# Patient Record
Sex: Male | Born: 1935
Health system: Southern US, Community
[De-identification: ages and names within clinical notes are randomized; demographics above are authoritative.]

## PROBLEM LIST (undated history)

## (undated) DIAGNOSIS — Z77090 Contact with and (suspected) exposure to asbestos: Secondary | ICD-10-CM

## (undated) DIAGNOSIS — I10 Essential (primary) hypertension: Secondary | ICD-10-CM

## (undated) DIAGNOSIS — M545 Low back pain, unspecified: Secondary | ICD-10-CM

## (undated) DIAGNOSIS — D510 Vitamin B12 deficiency anemia due to intrinsic factor deficiency: Secondary | ICD-10-CM

## (undated) DIAGNOSIS — G600 Hereditary motor and sensory neuropathy: Secondary | ICD-10-CM

## (undated) DIAGNOSIS — I2510 Atherosclerotic heart disease of native coronary artery without angina pectoris: Secondary | ICD-10-CM

## (undated) DIAGNOSIS — G4730 Sleep apnea, unspecified: Secondary | ICD-10-CM

## (undated) DIAGNOSIS — K219 Gastro-esophageal reflux disease without esophagitis: Secondary | ICD-10-CM

## (undated) DIAGNOSIS — F17200 Nicotine dependence, unspecified, uncomplicated: Secondary | ICD-10-CM

## (undated) DIAGNOSIS — E119 Type 2 diabetes mellitus without complications: Secondary | ICD-10-CM

## (undated) DIAGNOSIS — D72819 Decreased white blood cell count, unspecified: Secondary | ICD-10-CM

## (undated) DIAGNOSIS — M1990 Unspecified osteoarthritis, unspecified site: Secondary | ICD-10-CM

## (undated) DIAGNOSIS — J189 Pneumonia, unspecified organism: Secondary | ICD-10-CM

## (undated) DIAGNOSIS — J449 Chronic obstructive pulmonary disease, unspecified: Secondary | ICD-10-CM

## (undated) DIAGNOSIS — I219 Acute myocardial infarction, unspecified: Secondary | ICD-10-CM

## (undated) DIAGNOSIS — D509 Iron deficiency anemia, unspecified: Secondary | ICD-10-CM

## (undated) HISTORY — DX: Low back pain, unspecified: M54.50

## (undated) HISTORY — DX: Vitamin B12 deficiency anemia due to intrinsic factor deficiency: D51.0

## (undated) HISTORY — DX: Atherosclerotic heart disease of native coronary artery without angina pectoris: I25.10

## (undated) HISTORY — PX: CYST EXCISION: SHX5701

## (undated) HISTORY — DX: Nicotine dependence, unspecified, uncomplicated: F17.200

## (undated) HISTORY — DX: Contact with and (suspected) exposure to asbestos: Z77.090

## (undated) HISTORY — DX: Chronic obstructive pulmonary disease, unspecified: J44.9

## (undated) HISTORY — DX: Hereditary motor and sensory neuropathy: G60.0

## (undated) HISTORY — DX: Iron deficiency anemia, unspecified: D50.9

## (undated) HISTORY — DX: Decreased white blood cell count, unspecified: D72.819

## (undated) HISTORY — DX: Essential (primary) hypertension: I10

## (undated) HISTORY — DX: Low back pain: M54.5

## (undated) HISTORY — DX: Type 2 diabetes mellitus without complications: E11.9

## (undated) HISTORY — DX: Gastro-esophageal reflux disease without esophagitis: K21.9

## (undated) HISTORY — PX: CATARACT EXTRACTION W/ INTRAOCULAR LENS  IMPLANT, BILATERAL: SHX1307

---

## 1993-01-01 HISTORY — PX: CORONARY ARTERY BYPASS GRAFT: SHX141

## 1997-07-08 ENCOUNTER — Ambulatory Visit (HOSPITAL_COMMUNITY): Admission: RE | Admit: 1997-07-08 | Discharge: 1997-07-08 | Payer: Self-pay | Admitting: Internal Medicine

## 1998-03-24 ENCOUNTER — Inpatient Hospital Stay (HOSPITAL_COMMUNITY): Admission: EM | Admit: 1998-03-24 | Discharge: 1998-03-30 | Payer: Self-pay | Admitting: Emergency Medicine

## 1998-03-24 ENCOUNTER — Encounter: Payer: Self-pay | Admitting: Emergency Medicine

## 1998-03-29 ENCOUNTER — Encounter: Payer: Self-pay | Admitting: Internal Medicine

## 1998-10-12 ENCOUNTER — Ambulatory Visit: Admission: RE | Admit: 1998-10-12 | Discharge: 1998-10-12 | Payer: Self-pay | Admitting: Pulmonary Disease

## 1998-10-30 ENCOUNTER — Encounter: Payer: Self-pay | Admitting: Emergency Medicine

## 1998-10-30 ENCOUNTER — Inpatient Hospital Stay (HOSPITAL_COMMUNITY): Admission: EM | Admit: 1998-10-30 | Discharge: 1998-11-03 | Payer: Self-pay | Admitting: Emergency Medicine

## 1998-10-31 ENCOUNTER — Encounter: Payer: Self-pay | Admitting: Internal Medicine

## 1999-05-19 ENCOUNTER — Ambulatory Visit: Admission: RE | Admit: 1999-05-19 | Discharge: 1999-05-19 | Payer: Self-pay | Admitting: Pulmonary Disease

## 2000-06-24 ENCOUNTER — Encounter: Payer: Self-pay | Admitting: Cardiology

## 2000-06-24 ENCOUNTER — Ambulatory Visit (HOSPITAL_COMMUNITY): Admission: RE | Admit: 2000-06-24 | Discharge: 2000-06-24 | Payer: Self-pay | Admitting: Cardiology

## 2001-05-08 ENCOUNTER — Encounter: Payer: Self-pay | Admitting: Family Medicine

## 2001-05-08 ENCOUNTER — Ambulatory Visit (HOSPITAL_COMMUNITY): Admission: RE | Admit: 2001-05-08 | Discharge: 2001-05-08 | Payer: Self-pay | Admitting: Endocrinology

## 2002-05-12 ENCOUNTER — Encounter: Payer: Self-pay | Admitting: Pulmonary Disease

## 2002-09-21 LAB — HM COLONOSCOPY

## 2002-09-29 ENCOUNTER — Ambulatory Visit (HOSPITAL_COMMUNITY): Admission: RE | Admit: 2002-09-29 | Discharge: 2002-09-29 | Payer: Self-pay | Admitting: Gastroenterology

## 2002-09-29 ENCOUNTER — Encounter: Payer: Self-pay | Admitting: Gastroenterology

## 2002-12-08 ENCOUNTER — Ambulatory Visit (HOSPITAL_COMMUNITY): Admission: RE | Admit: 2002-12-08 | Discharge: 2002-12-08 | Payer: Self-pay | Admitting: Cardiology

## 2003-11-08 ENCOUNTER — Ambulatory Visit: Payer: Self-pay | Admitting: Endocrinology

## 2003-11-16 ENCOUNTER — Ambulatory Visit: Payer: Self-pay | Admitting: Endocrinology

## 2004-08-04 ENCOUNTER — Ambulatory Visit: Payer: Self-pay | Admitting: Pulmonary Disease

## 2004-08-15 ENCOUNTER — Ambulatory Visit: Payer: Self-pay

## 2004-08-15 ENCOUNTER — Ambulatory Visit: Payer: Self-pay | Admitting: Cardiology

## 2004-08-23 ENCOUNTER — Ambulatory Visit: Payer: Self-pay | Admitting: Cardiology

## 2004-09-01 ENCOUNTER — Ambulatory Visit: Payer: Self-pay | Admitting: Cardiology

## 2004-09-01 ENCOUNTER — Inpatient Hospital Stay (HOSPITAL_BASED_OUTPATIENT_CLINIC_OR_DEPARTMENT_OTHER): Admission: RE | Admit: 2004-09-01 | Discharge: 2004-09-01 | Payer: Self-pay | Admitting: Cardiology

## 2004-09-08 ENCOUNTER — Ambulatory Visit: Payer: Self-pay | Admitting: Pulmonary Disease

## 2004-12-07 ENCOUNTER — Ambulatory Visit: Payer: Self-pay | Admitting: Pulmonary Disease

## 2004-12-20 ENCOUNTER — Ambulatory Visit: Payer: Self-pay | Admitting: Endocrinology

## 2004-12-29 ENCOUNTER — Ambulatory Visit: Payer: Self-pay | Admitting: Endocrinology

## 2005-03-19 ENCOUNTER — Ambulatory Visit: Payer: Self-pay | Admitting: Pulmonary Disease

## 2005-04-17 ENCOUNTER — Observation Stay (HOSPITAL_COMMUNITY): Admission: EM | Admit: 2005-04-17 | Discharge: 2005-04-19 | Payer: Self-pay | Admitting: Emergency Medicine

## 2005-04-17 ENCOUNTER — Ambulatory Visit: Payer: Self-pay | Admitting: Internal Medicine

## 2005-04-20 ENCOUNTER — Ambulatory Visit: Payer: Self-pay | Admitting: Pulmonary Disease

## 2005-04-23 ENCOUNTER — Ambulatory Visit: Payer: Self-pay | Admitting: Endocrinology

## 2005-04-30 ENCOUNTER — Ambulatory Visit: Payer: Self-pay | Admitting: Endocrinology

## 2005-05-30 ENCOUNTER — Ambulatory Visit: Payer: Self-pay | Admitting: Endocrinology

## 2005-06-28 ENCOUNTER — Ambulatory Visit: Payer: Self-pay | Admitting: Endocrinology

## 2005-06-28 ENCOUNTER — Ambulatory Visit: Payer: Self-pay | Admitting: Pulmonary Disease

## 2005-08-07 ENCOUNTER — Ambulatory Visit: Payer: Self-pay | Admitting: Endocrinology

## 2005-12-26 ENCOUNTER — Ambulatory Visit: Payer: Self-pay | Admitting: Endocrinology

## 2006-01-25 ENCOUNTER — Ambulatory Visit: Payer: Self-pay | Admitting: Endocrinology

## 2006-03-01 ENCOUNTER — Ambulatory Visit: Payer: Self-pay | Admitting: Endocrinology

## 2006-04-01 ENCOUNTER — Ambulatory Visit: Payer: Self-pay | Admitting: Endocrinology

## 2006-04-26 ENCOUNTER — Ambulatory Visit: Payer: Self-pay | Admitting: Endocrinology

## 2006-05-30 ENCOUNTER — Ambulatory Visit: Payer: Self-pay | Admitting: Endocrinology

## 2006-06-28 ENCOUNTER — Ambulatory Visit: Payer: Self-pay | Admitting: Endocrinology

## 2006-07-26 ENCOUNTER — Encounter: Payer: Self-pay | Admitting: Endocrinology

## 2006-07-26 DIAGNOSIS — I2510 Atherosclerotic heart disease of native coronary artery without angina pectoris: Secondary | ICD-10-CM

## 2006-07-26 DIAGNOSIS — I10 Essential (primary) hypertension: Secondary | ICD-10-CM

## 2006-07-26 DIAGNOSIS — K219 Gastro-esophageal reflux disease without esophagitis: Secondary | ICD-10-CM

## 2006-07-31 ENCOUNTER — Ambulatory Visit: Payer: Self-pay | Admitting: Endocrinology

## 2006-08-29 ENCOUNTER — Ambulatory Visit: Payer: Self-pay | Admitting: Endocrinology

## 2006-08-29 LAB — CONVERTED CEMR LAB
Basophils Relative: 0.5 % (ref 0.0–1.0)
Hemoglobin: 12.4 g/dL — ABNORMAL LOW (ref 13.0–17.0)
Lymphocytes Relative: 23.6 % (ref 12.0–46.0)
MCHC: 34 g/dL (ref 30.0–36.0)
MCV: 88.1 fL (ref 78.0–100.0)
Monocytes Relative: 12.2 % — ABNORMAL HIGH (ref 3.0–11.0)
Neutro Abs: 2.8 10*3/uL (ref 1.4–7.7)
Platelets: 194 10*3/uL (ref 150–400)
RDW: 12.6 % (ref 11.5–14.6)
Saturation Ratios: 20.8 % (ref 20.0–50.0)
Transferrin: 240.5 mg/dL (ref >=212.0)
WBC: 4.9 10*3/uL (ref 4.5–10.5)

## 2006-11-19 ENCOUNTER — Encounter: Payer: Self-pay | Admitting: Endocrinology

## 2006-12-02 ENCOUNTER — Ambulatory Visit: Payer: Self-pay | Admitting: Endocrinology

## 2006-12-02 DIAGNOSIS — D510 Vitamin B12 deficiency anemia due to intrinsic factor deficiency: Secondary | ICD-10-CM

## 2006-12-02 LAB — CONVERTED CEMR LAB
Albumin: 4.3 g/dL (ref 3.5–5.2)
Alkaline Phosphatase: 34 units/L — ABNORMAL LOW (ref 39–117)
BUN: 16 mg/dL (ref 6–23)
Basophils Absolute: 0 10*3/uL (ref 0.0–0.1)
Bilirubin Urine: NEGATIVE
Bilirubin, Direct: 0.2 mg/dL (ref 0.0–0.3)
Creatinine, Ser: 0.6 mg/dL (ref 0.4–1.5)
Creatinine,U: 84.7 mg/dL
Crystals: NEGATIVE
Glucose, Bld: 124 mg/dL — ABNORMAL HIGH (ref 70–99)
HCT: 38 % — ABNORMAL LOW (ref 39.0–52.0)
Hemoglobin: 13 g/dL (ref 13.0–17.0)
Hgb A1c MFr Bld: 7 % — ABNORMAL HIGH (ref 4.6–6.0)
Ketones, ur: NEGATIVE mg/dL
LDL Cholesterol: 73 mg/dL (ref 0–99)
Leukocytes, UA: NEGATIVE
Lymphocytes Relative: 22 % (ref 12.0–46.0)
MCV: 89.8 fL (ref 78.0–100.0)
Microalb, Ur: 0.8 mg/dL (ref 0.0–1.9)
Monocytes Relative: 9 % (ref 3.0–11.0)
Mucus, UA: NEGATIVE
Neutrophils Relative %: 63 % (ref 43.0–77.0)
Nitrite: NEGATIVE
Sodium: 143 meq/L (ref 135–145)
Specific Gravity, Urine: 1.015 (ref 1.000–1.03)
TSH: 1.55 microintl units/mL (ref 0.35–5.50)
Triglycerides: 81 mg/dL (ref 0–149)
Urobilinogen, UA: 0.2 (ref 0.0–1.0)
WBC: 4.9 10*3/uL (ref 4.5–10.5)

## 2006-12-06 ENCOUNTER — Telehealth (INDEPENDENT_AMBULATORY_CARE_PROVIDER_SITE_OTHER): Payer: Self-pay | Admitting: *Deleted

## 2007-03-06 ENCOUNTER — Ambulatory Visit: Payer: Self-pay | Admitting: Endocrinology

## 2007-05-22 ENCOUNTER — Ambulatory Visit: Payer: Self-pay | Admitting: Endocrinology

## 2007-05-22 DIAGNOSIS — R079 Chest pain, unspecified: Secondary | ICD-10-CM

## 2007-05-22 DIAGNOSIS — F329 Major depressive disorder, single episode, unspecified: Secondary | ICD-10-CM

## 2007-05-22 LAB — CONVERTED CEMR LAB: Hgb A1c MFr Bld: 6.9 % — ABNORMAL HIGH (ref 4.6–6.0)

## 2007-05-28 ENCOUNTER — Ambulatory Visit: Payer: Self-pay

## 2007-05-28 ENCOUNTER — Encounter: Payer: Self-pay | Admitting: Endocrinology

## 2007-06-10 ENCOUNTER — Encounter: Payer: Self-pay | Admitting: Endocrinology

## 2007-06-11 ENCOUNTER — Encounter (INDEPENDENT_AMBULATORY_CARE_PROVIDER_SITE_OTHER): Payer: Self-pay | Admitting: *Deleted

## 2007-07-11 ENCOUNTER — Ambulatory Visit: Payer: Self-pay | Admitting: Cardiology

## 2007-08-14 ENCOUNTER — Ambulatory Visit: Payer: Self-pay | Admitting: Endocrinology

## 2007-08-14 ENCOUNTER — Ambulatory Visit: Payer: Self-pay | Admitting: Internal Medicine

## 2007-08-15 ENCOUNTER — Ambulatory Visit: Admission: RE | Admit: 2007-08-15 | Discharge: 2007-08-15 | Payer: Self-pay | Admitting: Pulmonary Disease

## 2007-09-02 ENCOUNTER — Ambulatory Visit: Payer: Self-pay | Admitting: Internal Medicine

## 2007-09-15 ENCOUNTER — Ambulatory Visit: Payer: Self-pay | Admitting: Internal Medicine

## 2007-09-18 ENCOUNTER — Telehealth: Payer: Self-pay | Admitting: Internal Medicine

## 2007-10-01 ENCOUNTER — Encounter: Payer: Self-pay | Admitting: Endocrinology

## 2007-12-03 ENCOUNTER — Ambulatory Visit: Payer: Self-pay | Admitting: Endocrinology

## 2007-12-04 LAB — CONVERTED CEMR LAB
AST: 24 units/L (ref 0–37)
Albumin: 4.2 g/dL (ref 3.5–5.2)
Alkaline Phosphatase: 34 units/L — ABNORMAL LOW (ref 39–117)
Bacteria, UA: NEGATIVE
Basophils Relative: 0.7 % (ref 0.0–3.0)
Calcium: 9.3 mg/dL (ref 8.4–10.5)
Chloride: 107 meq/L (ref 96–112)
Creatinine, Ser: 0.6 mg/dL (ref 0.4–1.5)
Crystals: NEGATIVE
Eosinophils Relative: 7.4 % — ABNORMAL HIGH (ref 0.0–5.0)
GFR calc Af Amer: 169 mL/min
GFR calc non Af Amer: 140 mL/min
HCT: 36.2 % — ABNORMAL LOW (ref 39.0–52.0)
Hemoglobin: 12.3 g/dL — ABNORMAL LOW (ref 13.0–17.0)
Hgb A1c MFr Bld: 6.6 % — ABNORMAL HIGH (ref 4.6–6.0)
Leukocytes, UA: NEGATIVE
Microalb, Ur: 0.8 mg/dL (ref 0.0–1.9)
Monocytes Absolute: 0.6 10*3/uL (ref 0.1–1.0)
Mucus, UA: NEGATIVE
Neutrophils Relative %: 58.9 % (ref 43.0–77.0)
Nitrite: NEGATIVE
Platelets: 162 10*3/uL (ref 150–400)
RBC: 3.98 M/uL — ABNORMAL LOW (ref 4.22–5.81)
RDW: 12.5 % (ref 11.5–14.6)
TSH: 1.52 microintl units/mL (ref 0.35–5.50)
Total Bilirubin: 0.8 mg/dL (ref 0.3–1.2)
Total CHOL/HDL Ratio: 2.9
Urine Glucose: NEGATIVE mg/dL
Urobilinogen, UA: 0.2 (ref 0.0–1.0)
VLDL: 15 mg/dL (ref 0–40)

## 2007-12-22 ENCOUNTER — Encounter: Payer: Self-pay | Admitting: Endocrinology

## 2008-03-21 ENCOUNTER — Encounter: Payer: Self-pay | Admitting: Endocrinology

## 2008-05-03 ENCOUNTER — Ambulatory Visit: Payer: Self-pay | Admitting: Internal Medicine

## 2008-05-03 DIAGNOSIS — R0602 Shortness of breath: Secondary | ICD-10-CM

## 2008-05-03 DIAGNOSIS — G600 Hereditary motor and sensory neuropathy: Secondary | ICD-10-CM

## 2008-05-03 LAB — CONVERTED CEMR LAB
ALT: 20 units/L (ref 0–53)
Albumin: 4.2 g/dL (ref 3.5–5.2)
BUN: 21 mg/dL (ref 6–23)
Basophils Absolute: 0 10*3/uL (ref 0.0–0.1)
CK-MB: 1.2 ng/mL (ref 0.3–4.0)
CO2: 29 meq/L (ref 19–32)
Creatinine, Ser: 0.7 mg/dL (ref 0.4–1.5)
Eosinophils Absolute: 0.3 10*3/uL (ref 0.0–0.7)
Glucose, Bld: 94 mg/dL (ref 70–99)
Hemoglobin: 12.8 g/dL — ABNORMAL LOW (ref 13.0–17.0)
Lymphs Abs: 1.2 10*3/uL (ref 0.7–4.0)
MCV: 89.3 fL (ref 78.0–100.0)
Monocytes Relative: 11.9 % (ref 3.0–12.0)
RBC: 4.18 M/uL — ABNORMAL LOW (ref 4.22–5.81)
Relative Index: 3.8 — ABNORMAL HIGH (ref 0.0–2.5)
Total Bilirubin: 0.8 mg/dL (ref 0.3–1.2)
Total CK: 32 units/L (ref 7–232)
Total Protein: 6.8 g/dL (ref 6.0–8.3)

## 2008-05-07 ENCOUNTER — Encounter: Payer: Self-pay | Admitting: Internal Medicine

## 2008-05-07 ENCOUNTER — Telehealth: Payer: Self-pay | Admitting: Internal Medicine

## 2008-05-10 ENCOUNTER — Encounter: Payer: Self-pay | Admitting: Internal Medicine

## 2008-05-11 ENCOUNTER — Telehealth (INDEPENDENT_AMBULATORY_CARE_PROVIDER_SITE_OTHER): Payer: Self-pay | Admitting: *Deleted

## 2008-05-17 ENCOUNTER — Encounter: Payer: Self-pay | Admitting: Internal Medicine

## 2008-05-17 ENCOUNTER — Encounter: Payer: Self-pay | Admitting: Endocrinology

## 2008-05-24 ENCOUNTER — Encounter: Payer: Self-pay | Admitting: Internal Medicine

## 2008-05-25 ENCOUNTER — Encounter: Payer: Self-pay | Admitting: Internal Medicine

## 2008-06-03 ENCOUNTER — Encounter: Payer: Self-pay | Admitting: Internal Medicine

## 2008-06-17 ENCOUNTER — Encounter: Payer: Self-pay | Admitting: Internal Medicine

## 2008-06-30 ENCOUNTER — Encounter: Payer: Self-pay | Admitting: Internal Medicine

## 2008-06-30 ENCOUNTER — Encounter: Payer: Self-pay | Admitting: Endocrinology

## 2008-07-09 ENCOUNTER — Encounter: Payer: Self-pay | Admitting: Internal Medicine

## 2008-07-12 ENCOUNTER — Ambulatory Visit: Payer: Self-pay | Admitting: Internal Medicine

## 2008-07-19 ENCOUNTER — Ambulatory Visit: Payer: Self-pay | Admitting: Internal Medicine

## 2008-09-01 ENCOUNTER — Ambulatory Visit: Payer: Self-pay | Admitting: Endocrinology

## 2008-10-08 ENCOUNTER — Telehealth (INDEPENDENT_AMBULATORY_CARE_PROVIDER_SITE_OTHER): Payer: Self-pay | Admitting: *Deleted

## 2008-10-11 ENCOUNTER — Ambulatory Visit: Payer: Self-pay | Admitting: Endocrinology

## 2008-11-03 ENCOUNTER — Encounter: Payer: Self-pay | Admitting: Endocrinology

## 2008-12-06 ENCOUNTER — Encounter: Payer: Self-pay | Admitting: Endocrinology

## 2008-12-07 ENCOUNTER — Ambulatory Visit: Payer: Self-pay | Admitting: Endocrinology

## 2008-12-07 LAB — CONVERTED CEMR LAB
Basophils Relative: 3.5 % — ABNORMAL HIGH (ref 0.0–3.0)
Bilirubin, Direct: 0.2 mg/dL (ref 0.0–0.3)
CO2: 31 meq/L (ref 19–32)
Chloride: 106 meq/L (ref 96–112)
Cholesterol: 141 mg/dL (ref 0–200)
Eosinophils Absolute: 0.4 10*3/uL (ref 0.0–0.7)
Eosinophils Relative: 8.5 % — ABNORMAL HIGH (ref 0.0–5.0)
Glucose, Bld: 119 mg/dL — ABNORMAL HIGH (ref 70–99)
Hemoglobin, Urine: NEGATIVE
Hemoglobin: 12.6 g/dL — ABNORMAL LOW (ref 13.0–17.0)
Ketones, ur: NEGATIVE mg/dL
LDL Cholesterol: 73 mg/dL (ref 0–99)
Leukocytes, UA: NEGATIVE
MCHC: 33.9 g/dL (ref 30.0–36.0)
Microalb, Ur: 0.9 mg/dL (ref 0.0–1.9)
Monocytes Absolute: 0.4 10*3/uL (ref 0.1–1.0)
Monocytes Relative: 8.9 % (ref 3.0–12.0)
Neutro Abs: 2.5 10*3/uL (ref 1.4–7.7)
Nitrite: NEGATIVE
PSA: 1.33 ng/mL (ref 0.10–4.00)
Platelets: 164 10*3/uL (ref 150.0–400.0)
Potassium: 4.8 meq/L (ref 3.5–5.1)
RBC: 3.98 M/uL — ABNORMAL LOW (ref 4.22–5.81)
Saturation Ratios: 23.5 % (ref 20.0–50.0)
Sodium: 143 meq/L (ref 135–145)
Specific Gravity, Urine: 1.015 (ref 1.000–1.030)
TSH: 1.8 microintl units/mL (ref 0.35–5.50)
Total Bilirubin: 0.7 mg/dL (ref 0.3–1.2)
Total Protein: 6.8 g/dL (ref 6.0–8.3)
Urine Glucose: NEGATIVE mg/dL
VLDL: 14.4 mg/dL (ref 0.0–40.0)
WBC: 4.5 10*3/uL (ref 4.5–10.5)

## 2009-03-10 ENCOUNTER — Ambulatory Visit: Payer: Self-pay | Admitting: Endocrinology

## 2009-03-10 DIAGNOSIS — D509 Iron deficiency anemia, unspecified: Secondary | ICD-10-CM

## 2009-03-10 DIAGNOSIS — M545 Low back pain: Secondary | ICD-10-CM

## 2009-03-10 LAB — CONVERTED CEMR LAB
BUN: 18 mg/dL (ref 6–23)
Basophils Absolute: 0.1 10*3/uL (ref 0.0–0.1)
Eosinophils Absolute: 0.2 10*3/uL (ref 0.0–0.7)
GFR calc non Af Amer: 172.15 mL/min (ref >60)
Hemoglobin: 12.7 g/dL — ABNORMAL LOW (ref 13.0–17.0)
Iron: 81 ug/dL (ref 42–165)
Ketones, ur: NEGATIVE mg/dL
Lymphocytes Relative: 22.4 % (ref 12.0–46.0)
MCHC: 32.4 g/dL (ref 30.0–36.0)
Monocytes Relative: 13 % — ABNORMAL HIGH (ref 3.0–12.0)
Neutro Abs: 3 10*3/uL (ref 1.4–7.7)
Neutrophils Relative %: 59.7 % (ref 43.0–77.0)
Platelets: 182 10*3/uL (ref 150.0–400.0)
Potassium: 4.9 meq/L (ref 3.5–5.1)
RDW: 12.2 % (ref 11.5–14.6)
Saturation Ratios: 24.3 % (ref 20.0–50.0)
Sed Rate: 12 mm/hr (ref 0–22)
Specific Gravity, Urine: 1.02 (ref 1.000–1.030)
Total Protein, Urine: NEGATIVE mg/dL
Transferrin: 238.3 mg/dL (ref 212.0–360.0)
Urine Glucose: NEGATIVE mg/dL
pH: 7 (ref 5.0–8.0)

## 2009-03-11 ENCOUNTER — Telehealth: Payer: Self-pay | Admitting: Endocrinology

## 2009-05-24 ENCOUNTER — Ambulatory Visit: Payer: Self-pay | Admitting: Endocrinology

## 2009-05-24 LAB — CONVERTED CEMR LAB
Basophils Absolute: 0 10*3/uL (ref 0.0–0.1)
HCT: 35.3 % — ABNORMAL LOW (ref 39.0–52.0)
Hgb A1c MFr Bld: 7 % — ABNORMAL HIGH (ref 4.6–6.5)
Iron: 75 ug/dL (ref 42–165)
Lymphs Abs: 1 10*3/uL (ref 0.7–4.0)
MCV: 92.1 fL (ref 78.0–100.0)
Monocytes Absolute: 0.5 10*3/uL (ref 0.1–1.0)
Monocytes Relative: 11.4 % (ref 3.0–12.0)
Neutrophils Relative %: 63.2 % (ref 43.0–77.0)
Platelets: 197 10*3/uL (ref 150.0–400.0)
RDW: 14.1 % (ref 11.5–14.6)
Saturation Ratios: 23.2 % (ref 20.0–50.0)

## 2009-05-26 ENCOUNTER — Telehealth: Payer: Self-pay | Admitting: Endocrinology

## 2009-06-29 ENCOUNTER — Ambulatory Visit: Payer: Self-pay | Admitting: Internal Medicine

## 2009-06-29 DIAGNOSIS — R21 Rash and other nonspecific skin eruption: Secondary | ICD-10-CM | POA: Insufficient documentation

## 2009-09-01 ENCOUNTER — Ambulatory Visit: Payer: Self-pay | Admitting: Surgery

## 2009-09-09 ENCOUNTER — Encounter: Payer: Self-pay | Admitting: Endocrinology

## 2009-12-08 ENCOUNTER — Ambulatory Visit: Payer: Self-pay | Admitting: Endocrinology

## 2009-12-08 ENCOUNTER — Encounter: Payer: Self-pay | Admitting: Endocrinology

## 2009-12-08 LAB — CONVERTED CEMR LAB
ALT: 20 units/L (ref 0–53)
BUN: 16 mg/dL (ref 6–23)
Basophils Absolute: 0 10*3/uL (ref 0.0–0.1)
CO2: 27 meq/L (ref 19–32)
Chloride: 102 meq/L (ref 96–112)
Cholesterol: 138 mg/dL (ref 0–200)
Eosinophils Relative: 3.5 % (ref 0.0–5.0)
Glucose, Bld: 114 mg/dL — ABNORMAL HIGH (ref 70–99)
HCT: 36 % — ABNORMAL LOW (ref 39.0–52.0)
Hgb A1c MFr Bld: 6.6 % — ABNORMAL HIGH (ref 4.6–6.5)
Iron: 79 ug/dL (ref 42–165)
Ketones, ur: NEGATIVE mg/dL
Leukocytes, UA: NEGATIVE
Lymphs Abs: 1 10*3/uL (ref 0.7–4.0)
MCHC: 34.4 g/dL (ref 30.0–36.0)
MCV: 90.5 fL (ref 78.0–100.0)
Microalb Creat Ratio: 1.3 mg/g (ref 0.0–30.0)
Monocytes Absolute: 0.6 10*3/uL (ref 0.1–1.0)
Nitrite: NEGATIVE
Platelets: 170 10*3/uL (ref 150.0–400.0)
Potassium: 4.4 meq/L (ref 3.5–5.1)
RDW: 13.6 % (ref 11.5–14.6)
Specific Gravity, Urine: 1.015 (ref 1.000–1.030)
TSH: 1.35 microintl units/mL (ref 0.35–5.50)
Total Bilirubin: 0.7 mg/dL (ref 0.3–1.2)
pH: 7 (ref 5.0–8.0)

## 2009-12-12 ENCOUNTER — Telehealth: Payer: Self-pay | Admitting: Endocrinology

## 2009-12-19 ENCOUNTER — Encounter: Payer: Self-pay | Admitting: Endocrinology

## 2010-01-22 ENCOUNTER — Encounter: Payer: Self-pay | Admitting: Pulmonary Disease

## 2010-01-31 NOTE — Assessment & Plan Note (Signed)
Summary: RASH IS NOT BETTER/ [facility]S PT /NWS   Vital Signs:  Patient profile:   77 year old male Height:      69 inches (175.26 cm) Weight:      201.13 pounds (91.42 kg) O2 Sat:      95 % on Room air Temp:     98.3 degrees F (36.83 degrees C) oral Pulse rate:   60 / minute BP sitting:   118 / 62  (left arm) Cuff size:   regular  Vitals Entered By: Orlan Leavens (June 29, 2009 11:06 AM)  O2 Flow:  Room air CC: Rash Is Patient Diabetic? Yes Did you bring your meter with you today? No Pain Assessment Patient in pain? no        Primary Care Provider:  Dr. Romero Belling  CC:  Rash.  History of Present Illness: continued rash on left calf - onset > 1 month ago - no better, no worse with treatment (triamcinolone cream) x 2 tubes since 5/24 OV no itch - no pain - no burning not spreading - no oozing - no other body area affected - no change in contact (same leg brace)  Current Medications (verified): 1)  Multivitamins   Tabs (Multiple Vitamin) .... Take 1 By Mouth Qd 2)  Cvs Zinc 50 Mg  Tabs (Zinc) .... Take 1 By Mouth Qd 3)  Cvs Aspirin 325 Mg  Tabs (Aspirin) .... Take 1 By Mouth Qd 4)  Metformin Hcl 1000 Mg  Tabs (Metformin Hcl) .... Take 1 By Mouth Two Times A Day 5)  Zocor 80 Mg  Tabs (Simvastatin) .... Take 1 By Mouth Qd 6)  Vitamin B-12 Cr 1000 Mcg  Tbcr (Cyanocobalamin) .... Take 1 Mg Injection Q Month 7)  Singulair 10 Mg  Tabs (Montelukast Sodium) .... Take 1 By Mouth Qd 8)  Niacin 500 Mg  Tabs (Niacin) .... Take 1 By Mouth Qd 9)  Sertraline Hcl 50 Mg  Tabs (Sertraline Hcl) .... Qd 10)  Ferro-Bob 325 (65 Fe) Mg  Tabs (Ferrous Sulfate) .... Once Daily 11)  Vitamin C Cr 1000 Mg  Cr-Tabs (Ascorbic Acid) .... Once Daily 12)  Kapidex 60 Mg Cpdr (Dexlansoprazole) .... Once Daily For Acid Reflux 13)  Methocarbamol 500 Mg Tabs (Methocarbamol) .Marland Kitchen.. 1 Every 6 Hrs As Needed For Muscle Spasms 14)  Triamcinolone Acetonide 0.025 % Crea (Triamcinolone Acetonide) .... Three Times A  Day As Needed For Rash  Allergies (verified): No Known Drug Allergies  Past History:  Past Medical History: COPD - mentioned in chart but patient denies this.  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    Physical Exam  General:  alert, well-developed, well-nourished, and cooperative to examination. nontoxic-  wife at side  Lungs:  Normal respiratory effort, chest expands  symmetrically. Lungs are clear to auscultation, no crackles or wheezes. Heart:  Normal rate and regular rhythm. S1 and S2 normal without gallop, murmur, click, rub or other extra sounds. Skin:  large confluent erythema along most of left calf with skin peeling over area when contact with leg brace - raised red edge at circumfrence of rash and satellite lesions distal to annular confluence along posterior leg ro achilles region, spares foot/sole   Impression & Recommendations:  Problem # 1:  SKIN RASH (ICD-782.1) >30d present without change - not spreading appears fungal - no change with 30d triamcinolone - try lotrisone topical - to call if unimproved x 2 weeks tx - pt/wife agrees His updated medication list for this problem includes:     Lotrisone 1-0.05 % Crea (Clotrimazole-betamethasone) .Marland Kitchen... Apply to affected skin two times a day  Orders: Prescription Created Electronically [phone])  Complete Medication List: 1)  Multivitamins Tabs (Multiple vitamin) .... Take 1 by mouth qd 2)  Cvs Zinc 50 Mg Tabs (Zinc) .... Take 1 by mouth qd 3)  Cvs Aspirin 325 Mg Tabs (Aspirin) .... Take 1 by mouth qd 4)  Metformin Hcl 1000 Mg Tabs (Metformin hcl) .... Take 1 by mouth two times a day 5)  Zocor 80 Mg Tabs (Simvastatin) .... Take 1 by mouth qd 6)  Vitamin B-12 Cr 1000 Mcg Tbcr (Cyanocobalamin) .... Take 1 mg injection q month 7)  Singulair 10 Mg Tabs (Montelukast sodium) .... Take 1 by mouth qd 8)  Niacin 500 Mg Tabs (Niacin) .... Take 1 by mouth qd 9)  Sertraline Hcl 50 Mg Tabs (Sertraline hcl) .... Qd 10)  Ferro-bob 325 (65 Fe) Mg Tabs (Ferrous sulfate) .... Once daily 11)  Vitamin C Cr 1000 Mg Cr-tabs (Ascorbic acid) .... Once daily 12)  Kapidex 60 Mg Cpdr (Dexlansoprazole) .... Once daily for acid reflux 13)  Methocarbamol 500 Mg Tabs (Methocarbamol) .Marland Kitchen.. 1 every 6 hrs as needed for muscle spasms 14)  Lotrisone 1-0.05 % Crea (Clotrimazole-betamethasone) .... Apply to affected skin two times a day  Other Orders: Vit B12 1000 mcg (J3420) Admin of Therapeutic Inj  intramuscular or subcutaneous (98119)  Patient Instructions: 1)  it was good to see you today. 2)  change cream to lotrisone to include treatment for possible fungus -  3)  use two times a day  4)  call if not better after 2 weeks of this treatment - 5)  Please schedule a follow-up appointment as needed/as scheduled. Prescriptions: LOTRISONE 1-0.05 % CREA (CLOTRIMAZOLE-BETAMETHASONE) apply to affected skin two times a day  #45g x 1   Entered and Authorized by:   Newt Lukes MD   Signed by:   Newt Lukes MD on 06/29/2009   Method used:   Electronically to        [facility].* (retail)       [address]       [place], [place]  [address]       Ph: [phone]       Fax: [phone]   RxID:   [id number]    Medication Administration  Injection # 1:    Medication: Vit B12 1000 mcg    Diagnosis: ANEMIA, PERNICIOUS (ICD-281.0)    Route: IM    Site: L deltoid    Exp Date: 02/2011    Lot #: [id number]    Mfr: American Regent    Patient tolerated injection without complications  Given by: Orlan Leavens (June 29, 2009 11:44 AM)  Orders Added: 1)  Vit B12 1000 mcg [J3420] 2)  Admin of Therapeutic Inj  intramuscular or subcutaneous [96372] 3)  Est. Patient Level IV [16109] 4)  Prescription Created Electronically [phone]

## 2010-01-31 NOTE — Progress Notes (Signed)
Summary: referral  Phone Note From Other Clinic   Caller: Referral Coordinator Summary of Call: RN at [facility] Dr Wynelle [place] office called stating that pt has been seen at there office for a LT foot ulcer. Per pt Insurance co now requires referral from PCP in order for claims to be paid. Pt and Dr's office are requesting a referral from [facility]. please advise Initial call taken by: Margaret Pyle, CMA,  May 26, 2009 11:33 AM  Follow-up for Phone Call        sent Follow-up by: Minus Breeding MD,  May 26, 2009 12:08 PM

## 2010-01-31 NOTE — Medication Information (Signed)
Summary: [facility]  [facility]   Imported By: Lester Kronenwetter 09/14/2009 08:19:31  _____________________________________________________________________  External Attachment:    Type:   Image     Comment:   External Document

## 2010-01-31 NOTE — Progress Notes (Signed)
Summary: [facility]  Phone Note From Other Clinic   Summary of Call: Received fax from [facility] and MD said he is past due for a follow up.  Informed pt and transferred his call to schedulers. Initial call taken by: Josph Macho CMA,  October 08, 2008 2:42 PM     Appended Document: [facility] Put paperwork on MD's desk  Appended Document: [facility] Faxed paperwork

## 2010-01-31 NOTE — Assessment & Plan Note (Signed)
Summary: PT HURT HIS BACK THIS WK--STC   Vital Signs:  Patient profile:   77 year old male Height:      69 inches (175.26 cm) Weight:      203 pounds (92.27 kg) O2 Sat:      96 % on Room air Temp:     97.8 degrees F (36.56 degrees C) oral Pulse rate:   59 / minute BP sitting:   128 / 60  (left arm) Cuff size:   large  Vitals Entered By: Josph Macho RMA (March 10, 2009 1:09 PM)  O2 Flow:  Room air CC: Hurt back X1week/ B12/ CF Is Patient Diabetic? Yes   Referring Provider:  Dr. Charlies Constable Primary Provider:  Dr. Romero Belling  CC:  Hurt back X1week/ B12/ CF.  History of Present Illness: pt states 1 week of moderate pain rad from the right lumbar paravertebral area to the right flank.  no associated sxs.  no injury.  he has not had back problems in the past.  alleve and tramadol have not helped.   he takes merformin as rx'ed.  Current Medications (verified): 1)  Multivitamins   Tabs (Multiple Vitamin) .... Take 1 By Mouth Qd 2)  Cvs Zinc 50 Mg  Tabs (Zinc) .... Take 1 By Mouth Qd 3)  Cvs Aspirin 325 Mg  Tabs (Aspirin) .... Take 1 By Mouth Qd 4)  Metformin Hcl 1000 Mg  Tabs (Metformin Hcl) .... Take 1 By Mouth Two Times A Day Qd 5)  Zocor 80 Mg  Tabs (Simvastatin) .... Take 1 By Mouth Qd 6)  Vitamin B-12 Cr 1000 Mcg  Tbcr (Cyanocobalamin) .... Take 1 Mg Injection Q Month 7)  Ultram 50 Mg  Tabs (Tramadol Hcl) .... Use Prn 8)  Singulair 10 Mg  Tabs (Montelukast Sodium) .... Take 1 By Mouth Qd 9)  Niacin 500 Mg  Tabs (Niacin) .... Take 1 By Mouth Qd 10)  Sertraline Hcl 50 Mg  Tabs (Sertraline Hcl) .... Qd 11)  Ferro-Bob 325 (65 Fe) Mg  Tabs (Ferrous Sulfate) .... Once Daily 12)  Vitamin C Cr 1000 Mg  Cr-Tabs (Ascorbic Acid) .... Once Daily 13)  Kapidex 60 Mg Cpdr (Dexlansoprazole) .... Once Daily For Acid Reflux  Allergies (verified): No Known Drug Allergies  Past History:  Past Medical History: Last updated: 05/03/2008 COPD - mentioned in chart but patient denies  this.  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  Physical Exam  General:  no distress Msk:  back is nontender gait is steady with a cane, but painful at the right lower back. Neurologic:  sensation is intact to touch on the feet, but decreased from normal (chronic) Additional Exam:  Hemoglobin A1C       [H]  6.6 %  Iron Saturation  24.3 %                      20.0-50.0    Hemoglobin           [L]  12.7 g/dL                   16.1-09.6   Hematocrit                39.3 %         Impression & Recommendations:  Problem # 1:  BACK PAIN, LUMBAR (ICD-724.2) Assessment New  Problem # 2:  DIABETES MELLITUS, TYPE II (ICD-250.00) well-controlled  Problem # 3:  ANEMIA, IRON DEFICIENCY (ICD-280.9) Assessment: Improved  Medications Added to Medication List This Visit: 1)  Vicodin 5-500 Mg Tabs (Hydrocodone-acetaminophen) .... 1/2-1 every 4 hrs as needed for back pain 2)  Methocarbamol 500 Mg Tabs (Methocarbamol) .Marland Kitchen.. 1 every 6 hrs as needed for muscle spasms  Other Orders: Vit B12 1000 mcg (J3420) Admin of Therapeutic Inj  intramuscular or subcutaneous (04540) T-Lumbar Spine 2 Views (72100TC) TLB-A1C / Hgb A1C (Glycohemoglobin)  (83036-A1C) TLB-Udip w/ Micro (81001-URINE) TLB-IBC Pnl (Iron/FE;Transferrin) (83550-IBC) TLB-CBC Platelet - w/Differential (85025-CBCD) TLB-Sedimentation Rate (ESR) (85652-ESR) TLB-BMP (Basic Metabolic Panel-BMET) (80048-METABOL) Est. Patient Level IV (98119)  Patient Instructions: 1)  vicodin 1/2-1 every 4 hrs as needed for back pain. 2)  x rays, blood tests and urine test today. 3)  tests are being ordered for you today.  a few days after the test(s), please call [phone] to hear your test results. 4)  call next week if not better, to consider mri. 5)  (update: i left message on phone-tree:  rx as we discussed) Prescriptions: METHOCARBAMOL 500 MG TABS (METHOCARBAMOL) 1 every 6 hrs as needed for muscle spasms  #50 x 1   Entered and Authorized by:   Minus Breeding MD   Signed by:   Minus Breeding MD on 03/11/2009   Method used:   Electronically to        [facility] (retail)       [address]       [place], [place]  [address]       Ph: [phone]       Fax: [phone]   RxID:   [id number] METHOCARBAMOL 500 MG TABS (METHOCARBAMOL) 1 every 6 hrs as needed for muscle spasms  #50 x 1   Entered and Authorized by:   Minus Breeding MD   Signed by:   Minus Breeding MD on 03/11/2009   Method used:   Print then Give to Patient   RxID:   [id number] VICODIN 5-500 MG TABS (HYDROCODONE-ACETAMINOPHEN) 1/2-1 every 4 hrs as needed for back pain  #50 x 2   Entered and Authorized by:   Minus Breeding MD   Signed by:   Minus Breeding MD on 03/10/2009   Method used:   Print then Give to Patient   RxID:   [id number]    Medication Administration  Injection # 1:    Medication: Vit B12 1000 mcg    Diagnosis: ANEMIA, PERNICIOUS (ICD-281.0)    Route: IM    Site: R deltoid    Exp Date: 10/12    Lot #: [id number]    Mfr: American Regent    Patient tolerated injection without complications    Given by: Josph Macho RMA (March 10, 2009  1:12 PM)  Orders Added: 1)  Vit B12 1000 mcg [J3420] 2)  Admin of Therapeutic Inj  intramuscular or subcutaneous [96372] 3)  T-Lumbar Spine 2 Views [72100TC] 4)  TLB-A1C / Hgb A1C (Glycohemoglobin) [83036-A1C] 5)  TLB-Udip w/ Micro [81001-URINE] 6)  TLB-IBC Pnl (Iron/FE;Transferrin) [83550-IBC] 7)  TLB-CBC Platelet - w/Differential [85025-CBCD] 8)  TLB-Sedimentation Rate (ESR) [85652-ESR] 9)  TLB-BMP (Basic Metabolic Panel-BMET) [80048-METABOL] 10)  Est. Patient Level IV [16109]

## 2010-01-31 NOTE — Assessment & Plan Note (Signed)
Summary: RASH LEG/#/CD   Vital Signs:  Patient profile:   77 year old male Height:      69 inches (175.26 cm) Weight:      201.13 pounds (91.42 kg) BMI:     29.81 O2 Sat:      96 % on Room air Temp:     98.1 degrees F (36.72 degrees C) oral Pulse rate:   67 / minute BP sitting:   128 / 74  (left arm) Cuff size:   large  Vitals Entered By: Josph Macho RMA (May 24, 2009 7:58 AM)  O2 Flow:  Room air CC: Rash on Left leg X1 week/ B12/ pt states he is no longer taking Vicodin/ CF Is Patient Diabetic? Yes   Referring Provider:  Dr. Charlies Constable Primary Provider:  Dr. Romero Belling  CC:  Rash on Left leg X1 week/ B12/ pt states he is no longer taking Vicodin/ CF.  History of Present Illness: pt states 1 week of severe rash at the left calf.  no assoc itching.  he thinks it might be related to abx (cipro and augmentin) given 1 month ago by podiatrist, which he has stopped.  the rash has improved over the past few days.  pt has applied cream from his dtr's prescription.   he takes fe 1 two times a day. pt takes metformin as rx'ed.  Current Medications (verified): 1)  Multivitamins   Tabs (Multiple Vitamin) .... Take 1 By Mouth Qd 2)  Cvs Zinc 50 Mg  Tabs (Zinc) .... Take 1 By Mouth Qd 3)  Cvs Aspirin 325 Mg  Tabs (Aspirin) .... Take 1 By Mouth Qd 4)  Metformin Hcl 1000 Mg  Tabs (Metformin Hcl) .... Take 1 By Mouth Two Times A Day Qd 5)  Zocor 80 Mg  Tabs (Simvastatin) .... Take 1 By Mouth Qd 6)  Vitamin B-12 Cr 1000 Mcg  Tbcr (Cyanocobalamin) .... Take 1 Mg Injection Q Month 7)  Singulair 10 Mg  Tabs (Montelukast Sodium) .... Take 1 By Mouth Qd 8)  Niacin 500 Mg  Tabs (Niacin) .... Take 1 By Mouth Qd 9)  Sertraline Hcl 50 Mg  Tabs (Sertraline Hcl) .... Qd 10)  Ferro-Bob 325 (65 Fe) Mg  Tabs (Ferrous Sulfate) .... Once Daily 11)  Vitamin C Cr 1000 Mg  Cr-Tabs (Ascorbic Acid) .... Once Daily 12)  Kapidex 60 Mg Cpdr (Dexlansoprazole) .... Once Daily For Acid Reflux 13)  Vicodin  5-500 Mg Tabs (Hydrocodone-Acetaminophen) .... 1/2-1 Every 4 Hrs As Needed For Back Pain 14)  Methocarbamol 500 Mg Tabs (Methocarbamol) .Marland Kitchen.. 1 Every 6 Hrs As Needed For Muscle Spasms  Allergies (verified): No Known Drug Allergies  Past History:  Past Medical History: Last updated: 05/03/2008 COPD - mentioned in chart but patient denies this.  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         no rash anywhere else on the body.    Physical Exam  General:  normal appearance.   Skin:  left calf: mod to severe eczematous rash.  Additional Exam:  Hemoglobin A1C       [H]  7.0 %                       4.6-6.5  Iron Saturation           23.2 %                      20.0-50.0  Hemoglobin           [L]  12.3 g/dL                   62.1-30.8   Hematocrit           [L]  35.3 %      Impression & Recommendations:  Problem # 1:  eczema recurrent  Problem # 2:  ANEMIA, IRON DEFICIENCY (ICD-280.9) needs increased rx  Problem # 3:  DIABETES MELLITUS, TYPE II (ICD-250.00) needs increased rx  Medications Added to Medication List This Visit: 1)  Metformin Hcl 1000 Mg Tabs (Metformin hcl) .... Take 1 by mouth two times a day 2)  Triamcinolone Acetonide 0.025 %  Crea (Triamcinolone acetonide) .... Three times a day as needed for rash  Other Orders: Vit B12 1000 mcg (J3420) Admin of Therapeutic Inj  intramuscular or subcutaneous (96372) TLB-A1C / Hgb A1C (Glycohemoglobin) (83036-A1C) TLB-IBC Pnl (Iron/FE;Transferrin) (83550-IBC) TLB-CBC Platelet - w/Differential (85025-CBCD) Est. Patient Level IV (65784)  Patient Instructions: 1)  stay-off the cipro and augmentin. 2)  triamcinolone cream three times a day as needed for the rash. 3)  call next week if it is not getting better. 4)  blood tests are being ordered for you today.  please call [phone] to hear your test results. 5)  Please schedule a physical appointment in 6 months. 6)  here are some tests for blood in the bowels.  please follow the instructions 7)  (update: i left message on phone-tree:  you should consider the addition of actos or [place].  please call back). Prescriptions: TRIAMCINOLONE ACETONIDE 0.025 % CREA (TRIAMCINOLONE ACETONIDE) three times a day as needed for rash  #1 med tube x 1   Entered and Authorized by:   Minus Breeding MD   Signed by:   Minus Breeding MD on 05/24/2009   Method used:   Electronically to        [facility] (retail)       [address]       [place], [place]  [address]       Ph: [phone]       Fax: [phone]   RxID:   [id number]    Medication Administration  Injection # 1:    Medication: Vit B12 1000 mcg    Diagnosis: ANEMIA, PERNICIOUS (ICD-281.0)    Route: IM    Site: L deltoid    Exp Date: 1/13    Lot #: [id number]    Mfr: American Regent    Patient tolerated injection without complications    Given by: Josph Macho RMA (May 24, 2009 8:01 AM)  Orders Added: 1)  Vit B12 1000 mcg [J3420] 2)  Admin of Therapeutic Inj  intramuscular  or subcutaneous [96372] 3)  TLB-A1C / Hgb A1C (Glycohemoglobin) [83036-A1C] 4)  TLB-IBC Pnl (Iron/FE;Transferrin) [83550-IBC] 5)  TLB-CBC Platelet -  w/Differential [85025-CBCD] 6)  Est. Patient Level IV [52841]

## 2010-01-31 NOTE — Progress Notes (Signed)
Summary: back pain  Phone Note Call from Patient Call back at Home Phone [phone]   Caller: Patient Summary of Call: pt called stating that he is still having back pain. pt is requesting Rx for muscle relaxer to [facility] in [place].  Initial call taken by: Margaret Pyle, CMA,  March 11, 2009 3:20 PM  Follow-up for Phone Call        i sent rx Follow-up by: Minus Breeding MD,  March 11, 2009 3:33 PM  Additional Follow-up for Phone Call Additional follow up Details #1::        pt informed Additional Follow-up by: Margaret Pyle, CMA,  March 11, 2009 3:38 PM

## 2010-02-02 NOTE — Consult Note (Signed)
Summary: [facility]  [facility]   Imported By: Sherian Rein 01/06/2010 07:46:54  _____________________________________________________________________  External Attachment:    Type:   Image     Comment:   External Document

## 2010-02-02 NOTE — Assessment & Plan Note (Signed)
Summary: yearly f.u/#/cd   Vital Signs:  Patient profile:   77 year old male Height:      69 inches (175.26 cm) Weight:      198 pounds (90.00 kg) BMI:     29.35 O2 Sat:      95 % on Room air Temp:     98.2 degrees F (36.78 degrees C) oral Pulse rate:   61 / minute Pulse rhythm:   regular BP sitting:   132 / 64  (left arm) Cuff size:   regular  Vitals Entered By: Brenton Grills CMA Duncan Dull) (December 08, 2009 9:41 AM)  O2 Flow:  Room air CC: Yearly F/U/refills on Zocor, Singulair, Kapidex, Metformin sent to [facility] Is Patient Diabetic? Yes   Referring Provider:  Dr. Charlies Constable Primary Provider:  Dr. Romero Belling  CC:  Yearly F/U/refills on Zocor, Singulair, Kapidex, and Metformin sent to [facility].  History of Present Illness: here for regular wellness examination.  He's feeling pretty well in general, and does not drink or smoke. he refuses hemoccults.  i explained to him the possibility that he could still have cancer despite his colonoscopy.  Current Medications (verified): 1)  Multivitamins   Tabs (Multiple Vitamin) .... Take 1 By Mouth Qd 2)  Cvs Zinc 50 Mg  Tabs (Zinc) .... Take 1 By Mouth Qd 3)  Cvs Aspirin 325 Mg  Tabs (Aspirin) .... Take 1 By Mouth Qd 4)  Metformin Hcl 1000 Mg  Tabs (Metformin Hcl) .... Take 1 By Mouth Two Times A Day 5)  Zocor 80 Mg  Tabs (Simvastatin) .... Take 1 By Mouth Qd 6)  Vitamin B-12 Cr 1000 Mcg  Tbcr (Cyanocobalamin) .... Take 1 Mg Injection Q Month 7)  Singulair 10 Mg  Tabs (Montelukast Sodium) .... Take 1 By Mouth Qd 8)  Niacin 500 Mg  Tabs (Niacin) .... Take 1 By Mouth Qd 9)  Sertraline Hcl 50 Mg  Tabs (Sertraline Hcl) .... Qd 10)  Ferro-Bob 325 (65 Fe) Mg  Tabs (Ferrous Sulfate) .... Once Daily 11)  Vitamin C Cr 1000 Mg  Cr-Tabs (Ascorbic Acid) .... Once Daily 12)  Kapidex 60 Mg Cpdr (Dexlansoprazole) .... Once Daily For Acid Reflux 13)  Methocarbamol 500 Mg Tabs (Methocarbamol) .Marland Kitchen.. 1 Every 6 Hrs As Needed For Muscle  Spasms 14)  Lotrisone 1-0.05 % Crea (Clotrimazole-Betamethasone) .... Apply To Affected Skin Two Times A Day  Allergies (verified): No Known Drug Allergies  Past History:  Past Medical History: Last updated: 06/29/2009 COPD - mentioned in chart but patient denies this.  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 no cancer Charcot Alen Bleacher - 3 brothers, 2 sisters, son, grandsons, nephews  Social History: Reviewed history from 08/14/2007 and no changes required. 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 Was in service during [place] War  Review of Systems  The patient denies fever, weight loss, weight gain, vision loss, decreased hearing, chest pain, syncope, dyspnea on exertion, prolonged cough, headaches, abdominal pain, melena, hematochezia, severe indigestion/heartburn, hematuria, and suspicious skin lesions.         depression is well-controlled.    Physical Exam  General:  normal appearance.   Head:  head: no deformity eyes: no periorbital swelling, no proptosis external nose and ears are normal mouth: no  lesion seen Neck:  Supple without thyroid enlargement or tenderness.  Lungs:  Clear to auscultation bilaterally. Normal respiratory effort.  Heart:  Regular rate and rhythm without murmurs or gallops noted. Normal S1,S2.   Abdomen:  abdomen is soft, nontender.  no hepatosplenomegaly.   not distended.  no hernia  Rectal:  refused Prostate:  refused Msk:  muscle bulk and strength are grossly normal.  no obvious joint swelling.  gait is normal and steady  Pulses:  dorsalis pedis intact bilat.  no carotid bruit  Extremities:  (chronic) deformity of both ankles (cmt dz) there is a vein harvest scar at the right leg. there is bilateral onychomycosis, and rust-colored discoloration of the feet.  trace right pedal edema and trace left pedal edema.   (he wears braces on both legs) Neurologic:  cn 2-12 grossly intact.   readily moves all 4's.   sensation is intact to touch on the feet, but severely decreased from normal (chronic) Skin:  normal texture and temp.  no rash.  not diaphoretic  Cervical Nodes:  No significant adenopathy.  Psych:  Alert and cooperative; normal mood and affect; normal attention span and concentration.     Impression & Recommendations:  Problem # 1:  ROUTINE GENERAL MEDICAL EXAM@HEALTH  CARE FACL (ICD-V70.0)  Other Orders: Admin of Therapeutic Inj  intramuscular or subcutaneous (04540) Vit B12 1000 mcg (J3420) Flu Vaccine 65yrs + MEDICARE PATIENTS (J8119) Administration Flu vaccine - MCR (G0008) EKG w/ Interpretation (93000) TLB-Lipid Panel (80061-LIPID) TLB-BMP (Basic Metabolic Panel-BMET) (80048-METABOL) TLB-CBC Platelet - w/Differential (85025-CBCD) TLB-Hepatic/Liver Function Pnl (80076-HEPATIC) TLB-TSH (Thyroid Stimulating Hormone) (84443-TSH) TLB-IBC Pnl (Iron/FE;Transferrin) (83550-IBC) TLB-A1C / Hgb A1C (Glycohemoglobin) (83036-A1C) TLB-PSA (Prostate Specific Antigen) (84153-PSA) TLB-Microalbumin/Creat Ratio, Urine (82043-MALB) TLB-Udip w/ Micro  (81001-URINE) Est. Patient 65& > (14782)  Patient Instructions: 1)  you should have a follow-up with the heart doctor.  please call us or them, when you are ready to go. 2)  please consider these measures for your health:  minimize alcohol.  do not use tobacco products.  have a colonoscopy at least every 10 years from age 7.  keep firearms safely stored.  always use seat belts.  have working smoke alarms in your home.  see an eye doctor and dentist regularly.  never drive under the influence of alcohol or drugs (including prescription drugs).  those with fair skin should take precautions against the sun. 3)  please let me know what your wishes would be, if artificial life support measures should become necessary.  it is critically important to prevent falling down (keep floor areas well-lit, dry, and free of loose objects). 4)  Please schedule a follow-up appointment in 6 months. Prescriptions: KAPIDEX 60 MG CPDR (DEXLANSOPRAZOLE) once daily for acid reflux  #90 x 3   Entered by:   Brenton Grills CMA (AAMA)   Authorized by:   Minus Breeding MD  Signed by:   Brenton Grills CMA (AAMA) on 12/08/2009   Method used:   Faxed to ...       [facility] (mail[facility]order)       [address]       [place], [place]  [id number]       Ph: [phone]       Fax: [phone]   RxID:   [id number] SINGULAIR 10 MG  TABS (MONTELUKAST SODIUM) take 1 by mouth qd  #90 x 3   Entered by:   Brenton Grills CMA (AAMA)   Authorized by:   Minus Breeding MD   Signed by:   Brenton Grills CMA (AAMA) on 12/08/2009   Method used:   Faxed to ...       [facility] (mail[facility])       [address]       [place], [place]  [id number]       Ph: [phone]       Fax: [phone]   RxID:   [id number] ZOCOR 80 MG  TABS (SIMVASTATIN) take 1 by mouth qd  #90 x 3   Entered by:   Brenton Grills CMA (AAMA)   Authorized by:   Minus Breeding MD   Signed by:   Brenton Grills CMA (AAMA) on  12/08/2009   Method used:   Faxed to ...       [facility] ([facility])       [address]       [place], [place]  [id number]       Ph: [phone]       Fax: [phone]   RxID:   [id number] METFORMIN HCL 1000 MG  TABS (METFORMIN HCL) take 1 by mouth two times a day  #180 x 3   Entered by:   Brenton Grills CMA (AAMA)   Authorized by:   Minus Breeding MD   Signed by:   Brenton Grills CMA (AAMA) on 12/08/2009   Method used:   Faxed to ...       [facility] ([facility])       [address]       [place], [place]  [id number]       Ph: [phone]       Fax: [phone]   RxID:   [id number]    Medication Administration  Injection # 1:    Medication: Vit B12 1000 mcg    Diagnosis: ANEMIA, PERNICIOUS (ICD-281.0)    Route: IM    Site: L deltoid    Exp Date: 08/2011    Lot #: [id number]    Mfr: American Regent    Patient tolerated injection without complications    Given by: Brenton Grills CMA Duncan Dull) (December 08, 2009 10:06 AM)  Orders Added: 1)  Admin of Therapeutic Inj  intramuscular or subcutaneous [96372] 2)  Vit B12 1000 mcg [J3420] 3)  Flu Vaccine 54yrs + MEDICARE PATIENTS [Q2039] 4)  Administration Flu vaccine - MCR [G0008] 5)  EKG w/ Interpretation [93000] 6)  TLB-Lipid Panel [80061-LIPID] 7)  TLB-BMP (Basic Metabolic Panel-BMET) [80048-METABOL] 8)  TLB-CBC Platelet - w/Differential [85025-CBCD] 9)  TLB-Hepatic/Liver Function Pnl [80076-HEPATIC] 10)  TLB-TSH (Thyroid Stimulating Hormone) [84443-TSH] 11)  TLB-IBC Pnl (Iron/FE;Transferrin) [83550-IBC] 12)  TLB-A1C / Hgb A1C (Glycohemoglobin) [83036-A1C] 13)  TLB-PSA (Prostate Specific Antigen) [84153-PSA] 14)  TLB-Microalbumin/Creat Ratio, Urine [82043-MALB] 15)  TLB-Udip w/ Micro [81001-URINE] 16)  Est. Patient 65& > [52778]  Flu Vaccine Consent Questions     Do you have a history of severe allergic reactions to this vaccine? no    Any prior history of allergic reactions  to egg and/or gelatin? no    Do you have a sensitivity to the preservative Thimersol? no    Do you have a past history of Guillan-Barre Syndrome? no    Do you currently have an acute febrile illness? no    Have you ever had a severe reaction to latex? no    Vaccine information given and explained to patient? yes    Are you currently pregnant? no    Lot Number:[id number]   Exp Date:07/01/2010   Site Given  Right Deltoid IMflu1

## 2010-02-02 NOTE — Progress Notes (Signed)
Summary: rx refill req  Phone Note Refill Request Message from:  Fax from Pharmacy on December 12, 2009 4:28 PM  Refills Requested: Medication #1:  SERTRALINE HCL 50 MG  TABS qd   Dosage confirmed as above?Dosage Confirmed  Method Requested: Fax to [facility] Next Appointment Scheduled: none Initial call taken by: Brenton Grills CMA Duncan Dull),  December 12, 2009 4:28 PM    Prescriptions: SERTRALINE HCL 50 MG  TABS (SERTRALINE HCL) qd  #90 x 3   Entered by:   Brenton Grills CMA (AAMA)   Authorized by:   Minus Breeding MD   Signed by:   Brenton Grills CMA (AAMA) on 12/12/2009   Method used:   Faxed to ...       [facility] (mail[facility]order)       [address]       [place], [place]  [id number]       Ph: [phone]       Fax: [phone]   RxID:   [id number]

## 2010-04-04 ENCOUNTER — Telehealth: Payer: Self-pay | Admitting: Cardiovascular Disease

## 2010-04-04 NOTE — Telephone Encounter (Addendum)
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 He last saw him in 2009 and has been having CP on & off for the past few days. He has a significant pulmonary history so is always SOB. He had a CABG in 1995 and his last cardiac cath was in 1996 at [facility]. He would like to establish care with another one of our cardiologist since Juanda Chance is retired. He missed his 1 year f/u with Dr. Juanda Chance.  I have added him on to see Dr. Clifton James on Thursday 04/06/10 @ 9:45am. He is aware.

## 2010-04-04 NOTE — Telephone Encounter (Signed)
Pt has chest pains last week, thinks it was gas related but wants to know if he should have a heart cath or an ekg

## 2010-04-05 ENCOUNTER — Encounter: Payer: Self-pay | Admitting: Cardiovascular Disease

## 2010-04-06 ENCOUNTER — Ambulatory Visit (INDEPENDENT_AMBULATORY_CARE_PROVIDER_SITE_OTHER): Payer: Medicare PPO | Admitting: Cardiovascular Disease

## 2010-04-06 ENCOUNTER — Encounter: Payer: Self-pay | Admitting: Cardiovascular Disease

## 2010-04-06 VITALS — BP 126/62 | HR 70 | Ht 70.0 in | Wt 200.0 lb

## 2010-04-06 DIAGNOSIS — I2510 Atherosclerotic heart disease of native coronary artery without angina pectoris: Secondary | ICD-10-CM

## 2010-04-06 DIAGNOSIS — R079 Chest pain, unspecified: Secondary | ICD-10-CM

## 2010-04-06 MED ORDER — NITROGLYCERIN 0.4 MG SL SUBL: 0.4000 mg | SUBLINGUAL_TABLET | SUBLINGUAL | Status: DC | PRN

## 2010-04-06 NOTE — Assessment & Plan Note (Signed)
Pt with CABG 17 years ago with most recent cath 2006, now having chest pain c/w angina. Will give him prescription for SL NTG and plan Lexiscan myoview to rule out ischemia. He will let us know if pain begins to occur more frequently or changes in character. He will call [phone] if there are severe episodes of pain.

## 2010-04-06 NOTE — Progress Notes (Signed)
History of Present Illness:77 yo male with history of CAD with 5V CABG in 1995 with most recent relook cath 2006, DM, HTN, former smoker  who is here today for cardiac follow up. He has been followed in the past by Dr. Juanda Chance but has not been seen in our office since July 2009. Most recent lipids December 2011, total chol 138, HDL 45, LDL 78.   He tells me that he has been having left sided chest pains, sharp, lasts for 2-3 minutes. No associated diaphoresis. He reports constant SOB secondary to his lung disease. The pain has been severe at times.   Past Medical History  Diagnosis Date  . COPD (chronic obstructive pulmonary disease)   . Coronary artery disease   . Diabetes mellitus     type II  . GERD (gastroesophageal reflux disease)   . Hypertension   . Leukopenia   . Pernicious anemia   . Smoker     quit 1968--25 ppy  . Charcot-Marie-Tooth disease     assoiciated muscular dystrophy   . H/O: asbestos exposure     Past Surgical History  Procedure Date  . Coronary artery bypass graft 1995    Current Outpatient Prescriptions  Medication Sig Dispense Refill  . Ascorbic Acid (VITAMIN C) 500 MG tablet Take 500 mg by mouth daily.        Marland Kitchen aspirin 325 MG tablet Take 325 mg by mouth daily.        . clotrimazole-betamethasone (LOTRISONE) cream Apply topically 2 (two) times daily.        . cyanocobalamin 1000 MCG tablet Take 100 mcg by mouth daily.        Marland Kitchen dexlansoprazole (KAPIDEX) 60 MG capsule Take 60 mg by mouth daily.        . ferrous sulfate (FERRO-BOB) 325 (65 FE) MG tablet Take 325 mg by mouth daily with breakfast.        . metFORMIN (GLUCOPHAGE) 1000 MG tablet Take 1,000 mg by mouth 2 (two) times daily with a meal.        . methocarbamol (ROBAXIN) 500 MG tablet Take 500 mg by mouth 4 (four) times daily.        . montelukast (SINGULAIR) 10 MG tablet Take 10 mg by mouth at bedtime.        . Multiple Vitamins-Minerals (MULTIVITAMIN WITH MINERALS) tablet Take 1 tablet by mouth daily.         . niacin 500 MG tablet Take 500 mg by mouth daily with breakfast.        . sertraline (ZOLOFT) 50 MG tablet Take 50 mg by mouth daily.        . simvastatin (ZOCOR) 80 MG tablet Take 80 mg by mouth at bedtime.        . Zinc 50 MG CAPS Take 1 capsule by mouth.          Allergies not on file  History   Social History  . Marital Status: Widowed    Spouse Name: N/A    Number of Children: N/A  . Years of Education: N/A   Occupational History  . Retired     Games developer   Social History Main Topics  . Smoking status: Former Smoker -- 1.0 packs/day    Types: Cigarettes    Quit date: 01/01/1966  . Smokeless tobacco: Not on file  . Alcohol Use: Not on file  . Drug Use: Not on file  . Sexually Active: Not on file   Other  Topics Concern  . Not on file   Social History Narrative   Was in service during [place] War    Family History  Problem Relation Age of Onset  . Charcot-Marie-Tooth disease Sister   . Charcot-Marie-Tooth disease Brother   . Charcot-Marie-Tooth disease Brother   . Charcot-Marie-Tooth disease Brother   . Charcot-Marie-Tooth disease Sister   . Charcot-Marie-Tooth disease Son   . Charcot-Marie-Tooth disease Other     Review of Systems:  As stated in HPI and otherwise negative.   BP 126/62  Pulse 70  Ht 5\' 10"  (1.778 m)  Wt 200 lb (90.719 kg)  BMI 28.70 kg/m2  Physical Examination: General: Well developed, well nourished, NAD HEENT: OP clear, mucus membranes moist SKIN: warm, dry. No rashes. Neuro: No focal deficits Musculoskeletal: Muscle strength 5/5 all ext Psychiatric: Mood and affect normal Neck: No JVD, no carotid bruits, no thyromegaly, no lymphadenopathy. Lungs:Clear bilaterally, no wheezes, rhonci, crackles Cardiovascular: Regular rate and rhythm. No murmurs, gallops or rubs. Abdomen:Soft. Bowel sounds present. Non-tender.  Extremities: No lower extremity edema. Pulses are 2 + in the bilateral DP/PT.  EKG:NSR, rate 62 bpm.  Non-specific T wave flattening.

## 2010-04-06 NOTE — Patient Instructions (Signed)
Your physician recommends that you schedule a follow-up appointment in: 2-3 weeks with Dr. Clifton James Your physician has requested that you have a lexiscan myoview. For further information please visit [email or web address] Please follow instruction sheet, as given.  Your physician has recommended you make the following change in your medication: You may use Nitroglycerine as needed for chest pain.

## 2010-04-11 ENCOUNTER — Ambulatory Visit (HOSPITAL_COMMUNITY): Payer: Medicare PPO | Attending: Cardiovascular Disease | Admitting: Radiology

## 2010-04-11 DIAGNOSIS — I4949 Other premature depolarization: Secondary | ICD-10-CM

## 2010-04-11 DIAGNOSIS — R079 Chest pain, unspecified: Secondary | ICD-10-CM | POA: Insufficient documentation

## 2010-04-11 DIAGNOSIS — E119 Type 2 diabetes mellitus without complications: Secondary | ICD-10-CM

## 2010-04-11 DIAGNOSIS — R0789 Other chest pain: Secondary | ICD-10-CM

## 2010-04-11 DIAGNOSIS — I25810 Atherosclerosis of coronary artery bypass graft(s) without angina pectoris: Secondary | ICD-10-CM

## 2010-04-11 MED ORDER — TECHNETIUM TC 99M TETROFOSMIN IV KIT
33.0000 | PACK | Freq: Once | INTRAVENOUS | Status: AC | PRN
  Administered 2010-04-11: 33 via INTRAVENOUS

## 2010-04-11 MED ORDER — TECHNETIUM TC 99M TETROFOSMIN IV KIT
11.0000 | PACK | Freq: Once | INTRAVENOUS | Status: AC | PRN
  Administered 2010-04-11: 11 via INTRAVENOUS

## 2010-04-11 MED ORDER — REGADENOSON 0.4 MG/5ML IV SOLN
0.4000 mg | Freq: Once | INTRAVENOUS | Status: AC
  Administered 2010-04-11: 0.4 mg via INTRAVENOUS

## 2010-04-11 NOTE — Progress Notes (Signed)
[facility] SITE 3 NUCLEAR MED [address] [place] [address] [phone]  Cardiology Nuclear Med Study  [name] is a 77 y.o. male [id number] [birth year]   Nuclear Med Background Indication for Stress Test:  Evaluation for Ischemia and Graft Patency History:  '95 CABG, '06 Cath:patient grafts, '09 SWN:IOEVOJ, EF=67%, COPD Cardiac Risk Factors: Family History - CAD, History of Smoking, Hypertension, Lipids and NIDDM  Symptoms:  Chest Pain/Tightness (last date of chest discomfort was under the camera today, 5/10), DOE, Fatigue and Palpitations   Nuclear Pre-Procedure Caffeine/Decaff Intake:  7:00pm NPO After: 7:00pm   Lungs:  Diminished.  O2 sat 98% on RA. IV 0.9% NS with Angio Cath:  20g  IV Site: R Antecubital  IV Started by:  Stanton Kidney, EMT-P  Chest Size (in):  44 Cup Size: n/a  Height: 5\' 9"  (1.753 m)  Weight:  195 lb (88.451 kg)  BMI:  Body mass index is 28.80 kg/(m^2). Tech Comments:  NA    Nuclear Med Study 1 or 2 day study: 1 day  Stress Test Type:  Lexiscan  Reading MD: Charlton Haws, MD  Order Authorizing Provider:  Verne Carrow, MD  Resting Radionuclide: Technetium 24m Tetrofosmin  Resting Radionuclide Dose: 11.0 mCi   Stress Radionuclide:  Technetium 3m Tetrofosmin  Stress Radionuclide Dose: 33.0 mCi           Stress Protocol Rest HR: 54 Stress HR: 72  Rest BP: 114/63 Stress BP: 123/64  Exercise Time (min): n/a METS: n/a   Predicted Max HR: 144 bpm % Max HR: 50 bpm Rate Pressure Product: 8856   Dose of Adenosine (mg):  n/a Dose of Lexiscan: 0.4 mg  Dose of Atropine (mg): n/a Dose of Dobutamine: n/a mcg/kg/min (at max HR)  Stress Test Technologist: Rea [employer], CMA-N  Nuclear Technologist:  Doyne Keel, CNMT     Rest Procedure:  Myocardial perfusion imaging was performed at rest 45 minutes following the intravenous administration of Technetium 25m Tetrofosmin. Rest ECG: Sinus brady with rare PVC.  Stress  Procedure:  The patient received IV Lexiscan 0.4 mg over 15-seconds.  Technetium 42m Tetrofosmin injected at 30-seconds.  There were nonspecific T-wave changes with Lexiscan.  Quantitative spect images were obtained after a 45 minute delay. Stress ECG: No significant ST segment change suggestive of ischemia.  QPS Raw Data Images:  Normal; no motion artifact; normal heart/lung ratio. Stress Images:  Normal homogeneous uptake in all areas of the myocardium. Rest Images:  Normal homogeneous uptake in all areas of the myocardium. Subtraction (SDS):  Normal Transient Ischemic Dilatation (Normal <1.22):  1.11 Lung/Heart Ratio (Normal <0.45):  0.22  Quantitative Gated Spect Images QGS EDV:  100 ml QGS ESV:  33 ml QGS cine images:  NL LV Function; NL Wall Motion QGS EF: 67%  Impression Exercise Capacity:  Lexiscan with no exercise. BP Response:  Normal blood pressure response. Clinical Symptoms:  headache ECG Impression:  No significant ST segment change suggestive of ischemia. Comparison with Prior Nuclear Study: No images to compare  Overall Impression:  Normal stress nuclear study.    Signed by Rea College Crisco on 04/11/2010 at 2:24 PM.   Charlton Haws

## 2010-04-12 NOTE — Progress Notes (Addendum)
NUCLEAR MED. STUDY SENT TO DR Clifton James. [name]    Normal nuclear study. Can we let him know? Thanks, chris

## 2010-04-12 NOTE — Progress Notes (Signed)
lvmtcb

## 2010-04-13 ENCOUNTER — Encounter (HOSPITAL_COMMUNITY): Payer: Self-pay | Admitting: Cardiovascular Disease

## 2010-04-14 NOTE — Progress Notes (Signed)
Patient is aware of test/lab results.  

## 2010-04-20 ENCOUNTER — Ambulatory Visit: Payer: Medicare PPO | Admitting: Cardiovascular Disease

## 2010-05-16 NOTE — Assessment & Plan Note (Signed)
[facility]                            CARDIOLOGY OFFICE NOTE   NAME:[name], David ESSON                        MRN:          [id number]  DATE:07/11/2007                            DOB:          04/25/1931    PRIMARY CARE PHYSICIAN:  Gregary Signs A. Everardo All, MD   CLINICAL HISTORY:  Mr. Jawad is 77 years old and was sent to me by Dr.  Everardo All because of recent increasing symptoms of chest pain.  His chest  pain does not appear to be related to exertion and does not appear to  have any precipitating factors.  It usually lasts about a minute.  Dr.  Everardo All arranged for Mr. Riese to have a dobutamine Myoview scan, which  showed no ischemia, although he did have severe chest pain during the  test.  His LV function was also good.  Since that time, his chest pain  has become much less frequent.   Mr. Seeley had bypass surgery in 1995 and was last studied in 2006, at  which time all his grafts were patent.   PAST MEDICAL HISTORY:  Significant for Charcot-Marie-Tooth muscular  dystrophy with respiratory insufficiency and gait disturbance related to  this.  He had been followed by Dr. Sung Amabile for this, but has not seen a  pulmonary doctor recently, since Dr. Sung Amabile quit coming to the office.  His past medical history is also significant for hypertension and  hyperlipidemia.   CURRENT MEDICATIONS:  1. Aspirin.  2. Glucophage.  3. Niaspan.  4. Iron.  5. Zocor 80 daily.  6. Singulair.  7. B12.  8. Sertraline.   PHYSICAL EXAMINATION:  VITAL SIGNS:  Today, the blood pressure was  147/67, pulse 54 and regular.  He stated his blood pressures normally  run 120/80.  NECK:  There was no venous distention.  His carotid pulses were full  without bruits.  CHEST:  Clear, although with decreased diaphragmatic excursions.  CARDIAC:  Rhythm was regular. I could hear no murmurs or gallops.  ABDOMEN:  Soft with normal bowel sounds. There is no hepatosplenomegaly.  EXTREMITIES:   There were braces on both legs.   IMPRESSION:  1. Recent episode of chest pain, now improved with negative Myoview      scan - probably nonischemic symptoms.  2. Coronary artery disease status post coronary bypass graft surgery      in 1995 with patent graft in 2006.  3. Charcot-Marie-Tooth muscular dystrophy versus respiratory      insufficiency and gait disturbance.  4. Hypertension.  5. Hyperlipidemia.   RECOMMENDATIONS:  I think, Mr. Dafoe chest pain has pretty much  resolved and I think it is not likely that this is ischemic.  He  complained of alternating hot and cold spells.  We will check a TSH  since it has been more than 6 months since he has had one of these done.  We will plan a followup cardiac visit in a year.  I have also arranged  for Mr. Chrismer to see Dr. Marchelle Gearing, since Dr. Sung Amabile is no longer  seeing outpatients.  Bruce Elvera Lennox Juanda Chance, MD, [facility]  Electronically Signed    BRB/MedQ  DD: 07/11/2007  DT: 07/12/2007  Job #: [id number]

## 2010-05-19 NOTE — Cardiovascular Report (Signed)
NAME:  [name], [name]                           ACCOUNT NO.:  [id number]   MEDICAL RECORD NO.:  [id number]                   PATIENT TYPE:  OIB   LOCATION:  2860                                 FACILITY:  [facility]   PHYSICIAN:  Charlies Constable, M.D.                  DATE OF BIRTH:  [birth year]   DATE OF PROCEDURE:  12/08/2002  DATE OF DISCHARGE:                              CARDIAC CATHETERIZATION   CLINICAL HISTORY:  Mr. Barreiro is 77 years old and has muscular dystrophy  which affects both his walking and his breathing.  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  He also has coronary disease and had  bypass surgery in 1995.  He had patent grafts at catheterization in 2002.  Recently, he has had several episodes of chest pain which have been  suspicious for ischemia.  He was not able to lie flat for a Cardiolite scan  and we made a decision to evaluate him with angiography.   PROCEDURE:  The patient required elevation of his head and CPAP during the  procedure.  We performed the procedure via the right femoral artery using  arterial sheath and 6-French preformed coronary catheters.  A front wall  arterial puncture was performed and Omnipaque contrast was used.  We used  left bypass graft catheter for injection of the circumflex graft and a LIMA  catheter for injection of the LIMA graft.  Distal aortogram was performed to  rule out abdominal aortic aneurysm.  The right femoral artery was closed  with AngioSeal at the end of the procedure.  The patient tolerated the  procedure well and left the laboratory in satisfactory condition.   RESULTS:  The aortic pressure was 157/72 with a mean of 107.   Left ventricular pressure was 157/23.   Left main coronary artery:  Free of significant disease.   Left anterior descending artery:  Completely occluded after a small diagonal  branch.   Circumflex artery:  Completely occluded near its origin.   Right coronary artery:  Completely  occluded proximally.   The saphenous vein graft to the posterior descending branch of the artery  was patent and functioned normally and filled both posterior descending and  a posterolateral branch.   The saphenous vein graft to the marginal of the posterolateral branch of the  circumflex artery was patent and functioned normally.   The LIMA graft to the diagonal branch of the LAD and the LAD was patent and  functioned normally.  The LAD was small caliber and diffusely diseased after  the insertion with 70% narrowing in its mid to distal portion.   LEFT VENTRICULOGRAM:  The left ventriculogram performed in the RAO  projection showed good wall motion with no areas of hypokinesis.  The  estimated ejection fraction was 60%.   DISTAL AORTOGRAM:  A distal aortogram was performed which showed patent  renal arteries and no significant aortoiliac obstruction.   CONCLUSIONS:  1. Coronary artery disease status post coronary artery bypass graft surgery     1995.  2. Severe native vessel disease with total occlusion of left anterior     descending artery, circumflex artery, and right coronary artery.  3. Patent vein graft to the posterior descending branch of the right     coronary artery, patent vein graft to the marginal and posterolateral     branch of the circumflex artery, patent left internal mammary artery     graft to the diagonal branch of the left anterior descending and the left     anterior descending with 70% narrowing in the distal left anterior     descending.  4. Normal left ventricular function with ejection fraction estimated at 60%.   RECOMMENDATIONS:  The patient has no source of ischemia.  Will evaluate him  for other possible sources of his chest pain.  Will make him an outpatient  today.                                               Charlies Constable, M.D.    BB/MEDQ  D:  12/08/2002  T:  12/08/2002  Job:  [id number]   cc:   Gregary Signs A. Everardo All, M.D. [facility]   CP Lab

## 2010-05-19 NOTE — Assessment & Plan Note (Signed)
[facility]                                 ON-CALL NOTE   NAME:[name], David BUNDREN                        MRN:          [id number]  DATE:04/27/2006                            DOB:          09-Dec-1931    PRIMARY CARE PHYSICIAN:  Sean A. Everardo All, M.D.   CALLER:  Margarita Rana.   PHONE NUMBER:  [phone].   Patient has muscular dystrophy and wears braces.  Does not have feeling  in the lower extremities; however, today he had the onset of very sharp,  intermittent pain that goes from the back of his knee down to his heel.  It is not worse when he walks but will shoot pain and complains of  severe pain with this.  It is not worse on weightbearing.  He does have  a small sore or ulcer on one of his toes that is newly known, but it is  not very red or puffy, according to the daughter.  There is no swelling,  redness, or streaking up the leg or edema.   He is allowed to take Aleve, and they are going to try two Aleve.  If  his pain is still significant, he will be evaluated in the emergency  room.  We reviewed the signs and symptoms consistent with a blood clot,  which it does not sound like, but he does have an atypical presentation  because of his MD.     Neta Mends. Panosh, MD  Electronically Signed    WKP/MedQ  DD: 04/27/2006  DT: 04/27/2006  Job #: [id number]

## 2010-05-19 NOTE — H&P (Signed)
[name], [name] NO.:  [id number]   MEDICAL RECORD NO.:  [id number]          PATIENT TYPE:  EMS   LOCATION:  MAJO                         FACILITY:  [facility]   PHYSICIAN:  Rod Holler, MD      DATE OF BIRTH:  07/25/1931   DATE OF ADMISSION:  04/16/2005  DATE OF DISCHARGE:                                HISTORY & PHYSICAL   CHIEF COMPLAINT:  Fever.   HISTORY OF PRESENT ILLNESS:  [name] is a 77 year old male with a history  of muscular dystrophy, who presents with 1 day's complaints of fevers and  chills.  At home, the patient noted he had a temperature of 103.1.  He had  complaints of chills.  He had no cough, but complains of some worsening in  shortness of breath.  He has also had some sinus congestion.  He has had no  dysuria, no rashes, no symptoms consistent with cellulitis.  He did have 1  episode of diarrhea today.  He has no known sick contacts.   PAST MEDICAL HISTORY:  1.  Charcot-Marie-Tooth muscular dystrophy.  2.  Coronary artery disease, status post coronary artery bypass graft,      saphenous vein graft to the RCA, sequential saphenous vein graft to OM-1      and OM-2, sequential LIMA to LAD and diagonal.  3.  Diabetes mellitus, type 2.  4.  GERD.  5.  Hypertension.  6.  Hyperlipidemia.   MEDICINES:  1.  Glucophage 1000 mg p.o. b.i.d.  2.  Multivitamin one tab p.o. daily.  3.  Singulair 10 mg p.o. daily.  4.  Zocor 80 mg p.o. daily.   ALLERGIES:  No known drug allergies.   SOCIAL HISTORY:  The patient lives in [place], is widowed, is a former  smoker.   FAMILY HISTORY:  History of coronary artery disease.   REVIEW OF SYSTEMS:  All systems reviewed in details and are negative, except  as noted in the history of present illness.   PHYSICAL EXAMINATION:  VITAL SIGNS:  Temperature of 102.1 degrees, heart  rate 75, respiratory rate 20, oxygen saturation 96% on room air.  GENERAL:  Slightly obese male, alert and oriented x3, in  no apparent  distress.  HEENT:  Atraumatic, normocephalic.  Pupils are equal, round and reactive to  light.  Extraocular movements intact.  Oropharynx clear.  NECK:  Supple with no adenopathy, no JVD, no carotid bruits.  CHEST:  Absent breath sounds at the bases, otherwise decreased breath sounds  throughout with no wheezes and no rhonchi.  CORONARY:  Regular rhythm, normal rate, normal S1 and S2.  No murmurs, rubs,  or gallops.  ABDOMEN:  Soft, nontender and non-distended with active bowel sounds.  No  hepatosplenomegaly.  EXTREMITIES:  No edema, cool extremities distally with a purple  discoloration of his toes.  NEUROLOGIC:  No focal deficits.  SKIN:  No rashes.  PSYCHIATRIC:  Normal affect.   LABORATORY DATA:  Sodium 134, potassium 4, chloride 102, bicarb 26, BUN 16,  creatinine 0.6, glucose 117.  White blood cell  count 5.8, hematocrit 37.8,  platelets of 178,000.   EKG shows a normal sinus rhythm with nonspecific ST and T wave changes.   IMPRESSION AND PLAN:  [name] is a 77 year old male who presents with a  fever.  With his complaints of shortness of breath, this could be an  atypical presentation of a community-acquired pneumonia.  With the sinus  congestion, this could be a sinusitis.  This could also simply be a viral  syndrome.   PLAN:  1.  We will admit the patient for 23-hour observation.  2.  Infectious Disease followup on blood culture that were drawn in the      emergency department.  We will give him a dose of Rocephin and      Azithromycin in the emergency department.  3.  Hyperlipidemia:  We will continue him on his home dose of Zocor.  4.  Pulmonary:  We will continue his home dose of Singulair and place him on      BiPAP per respiratory protocol.  5.  Endocrine:  For his diabetes, we will continue his home dose of      Glucophage, do q.a.c. and nightly blood sugars and place him on a low-      intensity sliding-scale insulin.  6.  Fluids, electrolytes  and nutrition:  We will place the patient on a      diabetic diet.           ______________________________  Rod Holler, MD     TRK/MEDQ  D:  04/17/2005  T:  04/17/2005  Job:  [id number]

## 2010-05-19 NOTE — Cardiovascular Report (Signed)
NAMEGRIFFITH, SANTILLI NO.:  [id number]   MEDICAL RECORD NO.:  [id number]          PATIENT TYPE:  OIB   LOCATION:  6501                         FACILITY:  [facility]   PHYSICIAN:  Charlies Constable, M.D. [facility] DATE OF BIRTH:  June 11, 1931   DATE OF PROCEDURE:  09/01/2004  DATE OF DISCHARGE:                              CARDIAC CATHETERIZATION   CLINICAL HISTORY:  Mr. Ginley is 77 years old and has Charcot-Marie-Tooth  muscular dystrophy with respiratory problems and gait problems secondary to  this. He had bypass surgery in 1995. Recently he has had episodes of chest  tightness and had a syncopal episode. He was not a candidate for a  Cardiolite scan due to his respiratory difficulties.  We then brought him in  for a catheterization in the outpatient laboratory.   PROCEDURE:  The procedure was performed by the right femoral artery and  arterial sheath and 4-French preformed coronary catheters. We used a LIMA  catheter for injection of the LIMA graft. We used a Noto catheter for  injection of the right coronary artery and the saphenous vein graft to the  right coronary. The patient tolerated the procedure well and left the  laboratory in satisfactory condition.   RESULTS:  1.  The aortic pressure was 174/77 with a mean of 118 and left ventricular      pressure was 174/26.  2.  LEFT MAIN CORONARY ARTERY:  The left main coronary was free of disease.  3.  LEFT ANTERIOR DESCENDING ARTERY:  The left anterior descending artery      gave rise to a diagonal branch and was completely occluded.  4.  CIRCUMFLEX ARTERY:  The circumflex artery gave rise to a small marginal      branch that was almost completely occluded.  5.  RIGHT CORONARY:  The right coronary gave rise to conus branch and then      was completely occluded proximally.  6.  The saphenous vein graft to the distal right coronary artery was patent      and functioned normally and had a posterior descending and  posterolateral branch which were large vessels.  7.  The saphenous vein graft to marginal post branch of the circumflex      artery was patent and functioned normally.  8.  The LIMA graft to the diagonal branch of the LAD and the LAD was patent      and functioned normally.   LEFT VENTRICULOGRAM:  The left ventriculogram in the RAO projection shows  good wall motion with no areas of hypokinesis. The estimated ejection  fraction was 60%.   CONCLUSION:  1.  Coronary artery status post coronary bypass coronary artery bypass graft      surgery 1995.  2.  Severe native vessel disease with a total occlusion of the left anterior      descending artery, circumflex artery, and right coronary arteries.  3.  Patent vein graft to the distal right coronary artery, patent sequential      vein grafts to the marginal and posterolateral branches of the  circumflex artery, and patent sequential LIMA graft to the diagonal      branch LAD and LAD.  4.  Good LV function.   RECOMMENDATIONS:  All grafts are patent and there is no source of ischemia.  In view of these findings, I think, the patient's recent symptoms are not  cardiac in etiology. Dr. Sung Amabile is in the process of making a decision of  whether the patient will require a tracheostomy for his respiratory  problems.           ______________________________  Charlies Constable, M.D. [facility]     BB/MEDQ  D:  09/01/2004  T:  09/01/2004  Job:  [id number]   cc:   Oley Balm. Sung Amabile, M.D. [facility]  [address]  [place]  [place] [address]   Gregary Signs A. Everardo All, M.D. [facility]  [address]  [place]  [place] [address]   Charlies Constable, M.D. [facility]  [address]  [address]  [place]  [place] [address]   Cardiopulmonary Lab

## 2010-05-19 NOTE — Discharge Summary (Signed)
[name], MARSALA NO.:  [id number]   MEDICAL RECORD NO.:  [id number]          PATIENT TYPE:  INP   LOCATION:  3019                         FACILITY:  [facility]   PHYSICIAN:  Rene Paci, M.D. LHCDATE OF BIRTH:  01-Feb-1931   DATE OF ADMISSION:  04/16/2005  DATE OF DISCHARGE:  04/19/2005                                 DISCHARGE SUMMARY   DISCHARGE DIAGNOSES:  1.  Fever, likely secondary to sinusitis.  2.  Diarrhea.  Clostridium difficile negative.   HISTORY OF PRESENT ILLNESS:  The patient is a 77 year old male with a  history of muscular dystrophy who presented to the ER with complaints of  fever and chills x1 day.  He was noted at home to have a temperature of  103.1, and also had complaints of chills.  At that time, he also had  complaints of some sinus congestion.  He was admitted for further evaluation  and treatment.   PAST MEDICAL HISTORY:  1.  Charcot-Marie-Tooth muscular dystrophy.  2.  Coronary artery disease, status post coronary artery bypass graft.  3.  Diabetes type 2.  4.  Hypertension.  5.  GERD.  6.  Hyperlipidemia.   HOSPITAL COURSE:  PROBLEM #1 -  FEVER:  Likely secondary to sinus infection.  The patient was admitted and was placed on empiric Rocephin and Zithromax  for questionable pneumonia.  A chest x-ray was performed which showed no  acute disease.  He continued to have complaints of sinus congestion and  sinus headache.  Rocephin and Zithromax were changed to p.o. Avelox.  The  patient has remained afebrile.  He will be continue on Avelox for a total of  seven days.  Blood cultures were sent at the time of admission, and at the  time of this discharge remain no growth to date.  In addition, an urine  culture was performed which also remains no growth to date at the time of  this discharge summary.   PROBLEM #2 -  DIARRHEA:  The patient was noted to have loose stool.  The  stool was sent for C. diff which was negative.  He was  treated with Imodium.  He is currently tolerating p.o., and is encouraged to continue symptom  management with p.r.n. Imodium and hydration.  He is instructed to call Dr.  Everardo All should he develop a fever over 101, increasing weakness, or  worsening diarrhea.  He is also encouraged to return to the ER should he  become dehydrated.   DISCHARGE MEDICATIONS:  1.  Glucophage 1000 mg p.o. b.i.d.  2.  Multivitamin one tab p.o. daily.  3.  Singular 10 mg p.o. daily.  4.  Zocor 80 mg p.o. daily.  5.  Avelox 400 mg p.o. daily for six additional days.  6.  Aspirin 81 mg p.o. daily.  7.  Imodium p.r.n. loose stools.   PERTINENT LABORATORIES AT DISCHARGE:  Stool for C. diff was negative.  Hemoglobin 11.9, hematocrit 34.4, white blood cell count 12.6, platelets  179.  BUN 15, creatinine 0.7.   FOLLOWUP:  The patient is scheduled  to follow up with Dr. Romero Belling on  Monday, April 23, 2005, at 2:30 p.m.      David S. [name], NP      Rene Paci, M.D. [facility]  Electronically Signed    MSO/MEDQ  D:  04/19/2005  T:  04/19/2005  Job:  [id number]   cc:   Gregary Signs A. Everardo All, M.D. [facility]  [address]  [place]  [place] [address]

## 2010-05-19 NOTE — Cardiovascular Report (Signed)
[facility]  Patient:    [name]                        MRN: [id number] Proc. Date: 06/24/00 Adm. Date:  [id number] Attending:  Lenoria Farrier CC:         Justine Null, M.D. [facility]  Cardiopulmonary Laboratory   Cardiac Catheterization  PROCEDURES PERFORMED:  Cardiac catheterization.  CLINICAL HISTORY:  The patient is 77 years old and has documented coronary disease with previous interventions dating back to 1985 and bypass surgery by Dr. Andrey Campanile in 1995.  He recently saw Dian Queen with chest pain which had some typical and some atypical features for ischemia and Bill arranged for him to come in for catheterization.  He also has muscular dystrophy which requires leg braces and a cane for walking and which has affected his pulmonary status and he has significant pulmonary disease and cannot lie flat and requires a CPAP at night.  He is followed by Dr. Sung Amabile for this.  Dian Queen saw him and arranged for him to come in for catheterization.  DESCRIPTION OF PROCEDURE:  The procedure was performed via the right femoral artery using an arterial sheath and 6 French preformed coronary catheters.  A front wall arterial puncture was performed and Omnipaque contrast was used. We used a 6 [place] guiding catheter with side holes and injected the right coronary artery because of damping.  We used a LIMA catheter for injection of the LIMA graft.  Distal aortogram was performed to rule out abdominal aortic aneurysm.  The right femoral artery was closed with Perclose at the end of the procedure.  The patient tolerated the procedure well and left the laboratory in satisfactory condition.  RESULTS:  The left main coronary artery:  The left main coronary artery was free of significant disease.  Left anterior descending:  The left anterior descending artery gave rise to a diagonal branch and two septal perforators and then there was  competing flow distally.  There was a 90% proximal stenosis.  Circumflex artery:  The circumflex artery was completely occluded after a small marginal branch.  Right coronary artery:  The right coronary was completely occluded at its midportion.  The right coronary artery gave rise to a conus branch and a right ventricular branch just before the occlusion.  There was also a 90% proximal stenosis.  The saphenous vein graft to the distal right coronary artery was patent and functioned normally and had a posterior descending and posterolateral branch which appeared to be free of significant disease.  The sequential saphenous vein graft to the marginal and posterolateral branch of the circumflex artery was patent and functioned normally and there was no significant distal disease.  The sequential LIMA graft to the diagonal branch and the LAD was patent and functioned normally.  There was 40% narrowing in the LAD distal to the graft insertion site with irregularity distally.  LEFT VENTRICULOGRAPHY:  The left ventriculogram performed in the RAO projection showed good wall motion with no areas of hypokinesis.  The estimated ejection fraction was 60%.  DISTAL AORTOGRAM:  Distal aortogram was performed which showed patent renal arteries and no significant iliac obstruction.  CONCLUSIONS:  Coronary artery disease, status post coronary artery bypass graft surgery in 1985 with total occlusion of the native circumflex and right coronary arteries, 90% stenosis in the proximal left anterior descending, a patent left internal mammary artery graft to the  diagonal branch and the left anterior descending, a patent vein graft to the marginal and posterolateral branch of the circumflex artery, a patent vein graft to the distal right coronary artery and normal left ventricular function.  RECOMMENDATIONS:  All five grafts are patent and there is no source of ischemia.  We will plan reassurance and I  will discuss with the patient regarding any further evaluation. DD:  06/24/00 TD:  06/24/00 Job: [id number]

## 2010-08-21 ENCOUNTER — Ambulatory Visit (INDEPENDENT_AMBULATORY_CARE_PROVIDER_SITE_OTHER): Payer: Medicare PPO | Admitting: Internal Medicine

## 2010-08-21 ENCOUNTER — Ambulatory Visit (INDEPENDENT_AMBULATORY_CARE_PROVIDER_SITE_OTHER)
Admission: RE | Admit: 2010-08-21 | Discharge: 2010-08-21 | Disposition: A | Discharge status: Home / Self Care | Payer: Medicare PPO | Source: Ambulatory Visit | Attending: Internal Medicine | Admitting: Internal Medicine

## 2010-08-21 ENCOUNTER — Other Ambulatory Visit (INDEPENDENT_AMBULATORY_CARE_PROVIDER_SITE_OTHER): Payer: Medicare PPO

## 2010-08-21 ENCOUNTER — Encounter: Payer: Self-pay | Admitting: Internal Medicine

## 2010-08-21 VITALS — BP 122/60 | HR 65 | Temp 98.4°F | Ht 69.0 in | Wt 198.4 lb

## 2010-08-21 DIAGNOSIS — R5381 Other malaise: Secondary | ICD-10-CM

## 2010-08-21 DIAGNOSIS — E119 Type 2 diabetes mellitus without complications: Secondary | ICD-10-CM

## 2010-08-21 DIAGNOSIS — R0789 Other chest pain: Secondary | ICD-10-CM

## 2010-08-21 DIAGNOSIS — I10 Essential (primary) hypertension: Secondary | ICD-10-CM

## 2010-08-21 DIAGNOSIS — R5383 Other fatigue: Secondary | ICD-10-CM

## 2010-08-21 DIAGNOSIS — I2510 Atherosclerotic heart disease of native coronary artery without angina pectoris: Secondary | ICD-10-CM

## 2010-08-21 LAB — CBC WITH DIFFERENTIAL/PLATELET
Basophils Absolute: 0 10*3/uL (ref 0.0–0.1)
Basophils Relative: 0.3 % (ref 0.0–3.0)
Eosinophils Relative: 4.5 % (ref 0.0–5.0)
Hemoglobin: 12.3 g/dL — ABNORMAL LOW (ref 13.0–17.0)
Lymphocytes Relative: 26 % (ref 12.0–46.0)
Monocytes Relative: 11.2 % (ref 3.0–12.0)
Neutro Abs: 2.7 10*3/uL (ref 1.4–7.7)
RBC: 3.97 Mil/uL — ABNORMAL LOW (ref 4.22–5.81)
RDW: 13.2 % (ref 11.5–14.6)
WBC: 4.7 10*3/uL (ref 4.5–10.5)

## 2010-08-21 LAB — BASIC METABOLIC PANEL
Calcium: 9.2 mg/dL (ref 8.4–10.5)
GFR: 112.59 mL/min (ref >60.00)
Sodium: 138 mEq/L (ref 135–145)

## 2010-08-21 LAB — HEPATIC FUNCTION PANEL
AST: 21 U/L (ref 0–37)
Alkaline Phosphatase: 37 U/L — ABNORMAL LOW (ref 39–117)
Bilirubin, Direct: 0.1 mg/dL (ref 0.0–0.3)

## 2010-08-21 LAB — LIPID PANEL
HDL: 57.3 mg/dL (ref >39.00)
LDL Cholesterol: 45 mg/dL (ref 0–99)
Total CHOL/HDL Ratio: 2
VLDL: 30.2 mg/dL (ref 0.0–40.0)

## 2010-08-21 LAB — TSH: TSH: 1.29 u[IU]/mL (ref 0.35–5.50)

## 2010-08-21 LAB — HEMOGLOBIN A1C: Hgb A1c MFr Bld: 6.5 % (ref 4.6–6.5)

## 2010-08-21 MED ORDER — DICLOFENAC SODIUM 75 MG PO TBEC: 75.0000 mg | DELAYED_RELEASE_TABLET | Freq: Two times a day (BID) | ORAL | Status: DC

## 2010-08-21 NOTE — Progress Notes (Signed)
  Subjective:    Patient ID: [name], male    DOB: May 28, 1931, 77 y.o.   MRN: [id number]  HPI complains of chest soreness - Onset 2 weeks ago, gradually progressive but constantly present (no wax/wane) exac by cough (dry) - denies radiation of pain or shortness of breath  No nausea and vomiting, no sore throat but hoarse past 3 days No change of pain with position or meals Partial but temporary relief of pain with Aleve OTC  Past Medical History  Diagnosis Date  . Coronary artery disease   . Leukopenia   . Smoker     quit 1968--25 ppy  . H/O: asbestos exposure   . Pernicious anemia   . ANEMIA, IRON DEFICIENCY   . COPD (chronic obstructive pulmonary disease)   . BACK PAIN, LUMBAR   . Hypertension   . GERD (gastroesophageal reflux disease)   . DIABETES MELLITUS, TYPE II   . Charcot-Marie-Tooth disease     assoiciated muscular dystrophy     Review of Systems  Constitutional: Positive for fatigue. Negative for fever.  HENT: Negative for sore throat and postnasal drip.   Respiratory: Positive for cough. Negative for shortness of breath.   Cardiovascular: Negative for leg swelling.       Objective:   Physical Exam BP 122/60  Pulse 65  Temp(Src) 98.4 F (36.9 C) (Oral)  Ht 5\' 9"  (1.753 m)  Wt 198 lb 6.4 oz (89.994 kg)  BMI 29.30 kg/m2  SpO2 96%  Constitutional:  Mildly hoarse but appears well-developed and well-nourished. No distress. Wife at side Neck: Normal range of motion. Neck supple. No JVD present. No thyromegaly present.  Chest wall: tender at costo-sternal border L side > R Cardiovascular: Normal rate, regular rhythm and normal heart sounds.  No murmur heard. chronic trace BLE at ankles Pulmonary/Chest: Effort normal and breath sounds normal. No respiratory distress. no wheezes or crackle.  Musculoskeletal: BLE brace intact (chonic foot drop)  Skin: Skin is warm and dry.  No erythema or ulceration.  Psychiatric: he has a normal mood and affect. behavior  is normal. Judgment and thought content normal.    Lab Results  Component Value Date   WBC 5.4 12/08/2009   HGB 12.4* 12/08/2009   HCT 36.0* 12/08/2009   PLT 170.0 12/08/2009   CHOL 138 12/08/2009   TRIG 78.0 12/08/2009   HDL 44.80 12/08/2009   ALT 20 12/08/2009   AST 22 12/08/2009   NA 137 12/08/2009   K 4.4 12/08/2009   CL 102 12/08/2009   CREATININE 0.5 12/08/2009   BUN 16 12/08/2009   CO2 27 12/08/2009   TSH 1.35 12/08/2009   PSA 1.30 12/08/2009   HGBA1C 6.6* 12/08/2009   MICROALBUR 1.1 12/08/2009        Assessment & Plan:  Chest tightness - EKG without ischemia, recent nuclear stress test 04/2010 reviewed - no ischemia -  Exam is tender to palpation consistent with costochondritis - check CXR and tx with NSAIDs x 10d - erx done  also See problem list. Medications and labs reviewed today.

## 2010-08-21 NOTE — Assessment & Plan Note (Signed)
Check a1c now - on metformin and reports home cbgs well controlled Lab Results  Component Value Date   HGBA1C 6.6* 12/08/2009

## 2010-08-21 NOTE — Assessment & Plan Note (Signed)
CABG x 5 in 1995, cath 2006 - patent grafts and negative myoview 04/2010 (no ischemia) - all reviewed Continue medical mgmt as ongoing - follow up cards as planned

## 2010-08-21 NOTE — Patient Instructions (Signed)
It was good to see you today. EKG looks good today Test(s) ordered today, labs and chest xray. Your results will be called to you after review (48-72hours after test completion). If any changes need to be made, you will be notified at that time. Voltaren (generic) for pain in place of aleve - use 2x/day x 10days - Your prescription(s) have been submitted to your pharmacy. Please take as directed and contact our office if you believe you are having problem(s) with the medication(s).

## 2010-08-21 NOTE — Assessment & Plan Note (Signed)
BP Readings from Last 3 Encounters:  08/21/10 122/60  04/06/10 126/62  12/08/09 132/64   The current medical regimen is effective;  continue present plan and medications.

## 2010-08-22 ENCOUNTER — Ambulatory Visit: Payer: Medicare PPO | Admitting: Endocrinology

## 2010-09-29 LAB — BLOOD GAS, ARTERIAL
Acid-base deficit: 0.4
Drawn by: 101881
Patient temperature: 98.6
TCO2: 21.3
pH, Arterial: 7.404

## 2010-10-19 ENCOUNTER — Telehealth: Payer: Self-pay | Admitting: *Deleted

## 2010-10-19 NOTE — Telephone Encounter (Signed)
Appointment schedule 10/24/2010 11:15am.

## 2010-10-24 ENCOUNTER — Ambulatory Visit: Payer: Medicare PPO | Admitting: Endocrinology

## 2010-10-24 ENCOUNTER — Ambulatory Visit (INDEPENDENT_AMBULATORY_CARE_PROVIDER_SITE_OTHER): Payer: Medicare PPO | Admitting: Endocrinology

## 2010-10-24 ENCOUNTER — Encounter: Payer: Self-pay | Admitting: Endocrinology

## 2010-10-24 VITALS — BP 114/72 | HR 69 | Temp 97.9°F | Ht 69.5 in | Wt 198.0 lb

## 2010-10-24 DIAGNOSIS — Z23 Encounter for immunization: Secondary | ICD-10-CM

## 2010-10-24 DIAGNOSIS — I2510 Atherosclerotic heart disease of native coronary artery without angina pectoris: Secondary | ICD-10-CM

## 2010-10-24 DIAGNOSIS — M1990 Unspecified osteoarthritis, unspecified site: Secondary | ICD-10-CM | POA: Insufficient documentation

## 2010-10-24 DIAGNOSIS — E119 Type 2 diabetes mellitus without complications: Secondary | ICD-10-CM

## 2010-10-24 DIAGNOSIS — D509 Iron deficiency anemia, unspecified: Secondary | ICD-10-CM

## 2010-10-24 DIAGNOSIS — D510 Vitamin B12 deficiency anemia due to intrinsic factor deficiency: Secondary | ICD-10-CM

## 2010-10-24 MED ORDER — CYANOCOBALAMIN 1000 MCG/ML IJ SOLN
1000.0000 ug | Freq: Once | INTRAMUSCULAR | Status: AC
  Administered 2010-10-24: 1000 ug via INTRAMUSCULAR

## 2010-10-24 NOTE — Progress Notes (Signed)
Subjective:    Patient ID: [name], male    DOB: 05-May-1931, 77 y.o.   MRN: [id number]  HPI Pt states years of moderate arthralgias, worst at the shoulders.  He has chronic assoc numbness of the feet.  He seldom takes diclofenac.  Ultram gives some relief.   Pt has h/o cad/cabg.  No chest pain.   no cbg record, but states cbg's are  Past Medical History  Diagnosis Date  . Coronary artery disease   . Leukopenia   . Smoker     quit 1968--25 ppy  . H/O: asbestos exposure   . Pernicious anemia   . ANEMIA, IRON DEFICIENCY   . COPD (chronic obstructive pulmonary disease)   . BACK PAIN, LUMBAR   . Hypertension   . GERD (gastroesophageal reflux disease)   . DIABETES MELLITUS, TYPE II   . Charcot-Marie-Tooth disease     assoiciated muscular dystrophy     Past Surgical History  Procedure Date  . Coronary artery bypass graft 1995    History   Social History  . Marital Status: Widowed    Spouse Name: N/A    Number of Children: N/A  . Years of Education: N/A   Occupational History  . Retired     Games developer   Social History Main Topics  . Smoking status: Former Smoker -- 1.0 packs/day    Types: Cigarettes    Quit date: 01/01/1966  . Smokeless tobacco: Not on file  . Alcohol Use: No  . Drug Use: No  . Sexually Active: Not on file   Other Topics Concern  . Not on file   Social History Narrative   Was in service during [place] War    Current Outpatient Prescriptions on File Prior to Visit  Medication Sig Dispense Refill  . Ascorbic Acid (VITAMIN C) 500 MG tablet Take 500 mg by mouth daily.        Marland Kitchen aspirin 325 MG tablet Take 325 mg by mouth daily.        . clotrimazole-betamethasone (LOTRISONE) cream Apply topically 2 (two) times daily as needed.       . cyanocobalamin 1000 MCG tablet Take 500 mcg by mouth 2 (two) times daily.       . ferrous sulfate (FERRO-BOB) 325 (65 FE) MG tablet Take 325 mg by mouth 3 (three) times daily with meals.       . metFORMIN  (GLUCOPHAGE) 1000 MG tablet Take 1,000 mg by mouth 2 (two) times daily with a meal.        . montelukast (SINGULAIR) 10 MG tablet Take 10 mg by mouth at bedtime.        . niacin 500 MG tablet Take 500 mg by mouth daily with breakfast.        . sertraline (ZOLOFT) 50 MG tablet Take 50 mg by mouth daily.        . simvastatin (ZOCOR) 80 MG tablet Take 80 mg by mouth at bedtime.        . Zinc 50 MG CAPS Take 1 capsule by mouth.        . DISCONTD: traMADol (ULTRAM) 50 MG tablet Take 50 mg by mouth every 6 (six) hours as needed.        Marland Kitchen dexlansoprazole (KAPIDEX) 60 MG capsule Take 60 mg by mouth daily.        . methocarbamol (ROBAXIN) 500 MG tablet Take 500 mg by mouth 4 (four) times daily as needed.       Marland Kitchen  Multiple Vitamins-Minerals (MULTIVITAMIN WITH MINERALS) tablet Take 1 tablet by mouth daily.        . nitroGLYCERIN (NITROSTAT) 0.4 MG SL tablet Place 1 tablet (0.4 mg total) under the tongue every 5 (five) minutes as needed for chest pain.  25 tablet  3   No Known Allergies  Family History  Problem Relation Age of Onset  . Charcot-Marie-Tooth disease Sister   . Charcot-Marie-Tooth disease Brother   . Charcot-Marie-Tooth disease Brother   . Charcot-Marie-Tooth disease Brother   . Charcot-Marie-Tooth disease Sister   . Charcot-Marie-Tooth disease Son   . Charcot-Marie-Tooth disease Other    BP 114/72  Pulse 69  Temp(Src) 97.9 F (36.6 C) (Oral)  Ht 5' 9.5" (1.765 m)  Wt 198 lb (89.812 kg)  BMI 28.82 kg/m2  SpO2 97%  Review of Systems Denies hematuria and brbpr    Objective:   Physical Exam VITAL SIGNS:  See vs page GENERAL: no distress Pulses: dorsalis pedis intact bilat.   Feet: no deformity, but the right leg is chronically in a brace.  no ulcer on the feet.  feet are of normal color and temp.  no edema.  There is bilateral onychomycosis. There is an old healed vein harvest scar at the right leg.   Neuro: sensation is severely decreased to touch on the feet.     Lab  Results  Component Value Date   WBC 4.7 08/21/2010   HGB 12.3* 08/21/2010   HCT 36.3* 08/21/2010   MCV 91.5 08/21/2010   PLT 169.0 08/21/2010   Lab Results  Component Value Date   HGBA1C 6.5 08/21/2010      Assessment & Plan:  fe-deficiency anemia, needs increased rx OA.  Pain is well-controlled i removed voltaren form med list Dm, well-controlled.

## 2010-10-24 NOTE — Patient Instructions (Addendum)
Please come back for a regular physical appointment as scheduled. Increase iron to 1 pill 3x a day. (update: i left message on phone-tree:  rx as we discussed)

## 2010-10-25 ENCOUNTER — Other Ambulatory Visit: Payer: Self-pay

## 2010-10-25 MED ORDER — TRAMADOL HCL 50 MG PO TABS: 50.0000 mg | ORAL_TABLET | Freq: Four times a day (QID) | ORAL | Status: DC | PRN

## 2010-12-12 ENCOUNTER — Ambulatory Visit (INDEPENDENT_AMBULATORY_CARE_PROVIDER_SITE_OTHER): Payer: Medicare PPO | Admitting: Endocrinology

## 2010-12-12 ENCOUNTER — Other Ambulatory Visit (INDEPENDENT_AMBULATORY_CARE_PROVIDER_SITE_OTHER): Payer: Medicare PPO

## 2010-12-12 ENCOUNTER — Encounter: Payer: Self-pay | Admitting: Endocrinology

## 2010-12-12 VITALS — BP 132/60 | HR 59 | Temp 97.7°F | Ht 69.5 in | Wt 197.4 lb

## 2010-12-12 DIAGNOSIS — D509 Iron deficiency anemia, unspecified: Secondary | ICD-10-CM

## 2010-12-12 DIAGNOSIS — Z125 Encounter for screening for malignant neoplasm of prostate: Secondary | ICD-10-CM

## 2010-12-12 DIAGNOSIS — Z0000 Encounter for general adult medical examination without abnormal findings: Secondary | ICD-10-CM

## 2010-12-12 DIAGNOSIS — E119 Type 2 diabetes mellitus without complications: Secondary | ICD-10-CM

## 2010-12-12 DIAGNOSIS — D510 Vitamin B12 deficiency anemia due to intrinsic factor deficiency: Secondary | ICD-10-CM

## 2010-12-12 LAB — IBC PANEL
Iron: 67 ug/dL (ref 42–165)
Saturation Ratios: 19.6 % — ABNORMAL LOW (ref 20.0–50.0)
Transferrin: 243.7 mg/dL (ref 212.0–360.0)

## 2010-12-12 LAB — CBC WITH DIFFERENTIAL/PLATELET
Eosinophils Relative: 5.1 % — ABNORMAL HIGH (ref 0.0–5.0)
HCT: 37.2 % — ABNORMAL LOW (ref 39.0–52.0)
Monocytes Relative: 10.6 % (ref 3.0–12.0)
Neutrophils Relative %: 63.9 % (ref 43.0–77.0)
Platelets: 178 10*3/uL (ref 150.0–400.0)
WBC: 4.8 10*3/uL (ref 4.5–10.5)

## 2010-12-12 LAB — PSA: PSA: 2.21 ng/mL (ref 0.10–4.00)

## 2010-12-12 MED ORDER — METFORMIN HCL 1000 MG PO TABS: 1000.0000 mg | ORAL_TABLET | Freq: Two times a day (BID) | ORAL | Status: DC

## 2010-12-12 MED ORDER — DEXLANSOPRAZOLE 60 MG PO CPDR: 60.0000 mg | DELAYED_RELEASE_CAPSULE | Freq: Every day | ORAL | Status: DC

## 2010-12-12 MED ORDER — SIMVASTATIN 80 MG PO TABS: 80.0000 mg | ORAL_TABLET | Freq: Every day | ORAL | Status: DC

## 2010-12-12 MED ORDER — SERTRALINE HCL 50 MG PO TABS: 50.0000 mg | ORAL_TABLET | Freq: Every day | ORAL | Status: DC

## 2010-12-12 MED ORDER — TRAMADOL HCL 50 MG PO TABS: 50.0000 mg | ORAL_TABLET | Freq: Four times a day (QID) | ORAL | Status: DC | PRN

## 2010-12-12 MED ORDER — MONTELUKAST SODIUM 10 MG PO TABS: 10.0000 mg | ORAL_TABLET | Freq: Every day | ORAL | Status: DC

## 2010-12-12 MED ORDER — CYANOCOBALAMIN 1000 MCG/ML IJ SOLN
1000.0000 ug | Freq: Once | INTRAMUSCULAR | Status: AC
  Administered 2010-12-12: 1000 ug via INTRAMUSCULAR

## 2010-12-12 NOTE — Progress Notes (Signed)
Subjective:    Patient ID: [name], male    DOB: [birth year], 77 y.o.   MRN: [id number]  HPI here for regular wellness examination.  He's feeling pretty well in general, and says chronic med probs are stable, except as noted below Past Medical History  Diagnosis Date  . Coronary artery disease   . Leukopenia   . Smoker     quit 1968--25 ppy  . H/O: asbestos exposure   . Pernicious anemia   . ANEMIA, IRON DEFICIENCY   . COPD (chronic obstructive pulmonary disease)   . BACK PAIN, LUMBAR   . Hypertension   . GERD (gastroesophageal reflux disease)   . DIABETES MELLITUS, TYPE II   . Charcot-Marie-Tooth disease     assoiciated muscular dystrophy     Past Surgical History  Procedure Date  . Coronary artery bypass graft 1995    History   Social History  . Marital Status: Widowed    Spouse Name: N/A    Number of Children: N/A  . Years of Education: N/A   Occupational History  . Retired     Games developer   Social History Main Topics  . Smoking status: Former Smoker -- 1.0 packs/day    Types: Cigarettes    Quit date: 01/01/1966  . Smokeless tobacco: Not on file  . Alcohol Use: No  . Drug Use: No  . Sexually Active: Not on file   Other Topics Concern  . Not on file   Social History Narrative   Was in service during [place] War    Current Outpatient Prescriptions on File Prior to Visit  Medication Sig Dispense Refill  . Ascorbic Acid (VITAMIN C) 500 MG tablet Take 500 mg by mouth daily.        Marland Kitchen aspirin 325 MG tablet Take 325 mg by mouth daily.        . clotrimazole-betamethasone (LOTRISONE) cream Apply topically 2 (two) times daily as needed.       . cyanocobalamin 1000 MCG tablet Take 500 mcg by mouth 2 (two) times daily.       Marland Kitchen dexlansoprazole (KAPIDEX) 60 MG capsule Take 1 capsule (60 mg total) by mouth daily.  90 capsule  3  . ferrous sulfate (FERRO-BOB) 325 (65 FE) MG tablet Take 325 mg by mouth 3 (three) times daily with meals.       . metFORMIN  (GLUCOPHAGE) 1000 MG tablet Take 1 tablet (1,000 mg total) by mouth 2 (two) times daily with a meal.  360 tablet  3  . montelukast (SINGULAIR) 10 MG tablet Take 1 tablet (10 mg total) by mouth at bedtime.  90 tablet  3  . Multiple Vitamins-Minerals (MULTIVITAMIN WITH MINERALS) tablet Take 1 tablet by mouth daily.        . niacin 500 MG tablet Take 500 mg by mouth daily with breakfast.        . nitroGLYCERIN (NITROSTAT) 0.4 MG SL tablet Place 1 tablet (0.4 mg total) under the tongue every 5 (five) minutes as needed for chest pain.  25 tablet  3  . sertraline (ZOLOFT) 50 MG tablet Take 1 tablet (50 mg total) by mouth daily.  90 tablet  3  . simvastatin (ZOCOR) 80 MG tablet Take 1 tablet (80 mg total) by mouth at bedtime.  90 tablet  3  . traMADol (ULTRAM) 50 MG tablet Take 1 tablet (50 mg total) by mouth every 6 (six) hours as needed.  100 tablet  3  . Zinc  50 MG CAPS Take 1 capsule by mouth.          No Known Allergies  Family History  Problem Relation Age of Onset  . Charcot-Marie-Tooth disease Sister   . Charcot-Marie-Tooth disease Brother   . Charcot-Marie-Tooth disease Brother   . Charcot-Marie-Tooth disease Brother   . Charcot-Marie-Tooth disease Sister   . Charcot-Marie-Tooth disease Son   . Charcot-Marie-Tooth disease Other     BP 132/60  Pulse 59  Temp(Src) 97.7 F (36.5 C) (Oral)  Ht 5' 9.5" (1.765 m)  Wt 197 lb 6.4 oz (89.54 kg)  BMI 28.73 kg/m2  SpO2 97%     Review of Systems  Constitutional: Negative for fever and unexpected weight change.  HENT: Negative for hearing loss.   Eyes: Negative for visual disturbance.  Respiratory:       No change in chronic doe  Cardiovascular: Negative for chest pain.  Gastrointestinal: Negative for anal bleeding.  Genitourinary: Negative for hematuria and difficulty urinating.  Musculoskeletal:       He has fallen a few times--no injury  Skin: Negative for rash.  Neurological: Negative for syncope, light-headedness and  headaches.  Hematological: Does not bruise/bleed easily.  Psychiatric/Behavioral: Negative for dysphoric mood.      Objective:   Physical Exam VS: see vs page GEN: no distress HEAD: head: no deformity eyes: no periorbital swelling, no proptosis external nose and ears are normal mouth: no lesion seen NECK: supple, thyroid is not enlarged CHEST WALL: no deformity.  There is an old healed median sternotomy scar.   LUNGS: clear to auscultation BREASTS:  No gynecomastia CV: reg rate and rhythm, no murmur ABD: abdomen is soft, nontender.  no hepatosplenomegaly.  not distended.  no hernia RECTAL/PROSTATE:  refused MUSCULOSKELETAL: muscle bulk and strength are grossly normal.  no obvious joint swelling.  gait is normal and steady EXTEMITIES: done a few week ago PULSES: no carotid bruit.  dp pulses were checked a few weeks ago NEURO:  cn 2-12 grossly intact.   readily moves all 4's.  sensation on the feet was checked a few week ago. SKIN:  Normal texture and temperature.  No rash or suspicious lesion is visible.   NODES:  None palpable at the neck PSYCH: alert, oriented x3.  Does not appear anxious nor depressed.       Assessment & Plan:  Wellness visit today, with problems stable, except as noted.

## 2010-12-12 NOTE — Patient Instructions (Addendum)
please consider these measures for your health:  minimize alcohol.  do not use tobacco products.  have a colonoscopy at least every 10 years from age 75.   keep firearms safely stored.  always use seat belts.  have working smoke alarms in your home.  see an eye doctor and dentist regularly.  never drive under the influence of alcohol or drugs (including prescription drugs).  those with fair skin should take precautions against the sun. please let me know what your wishes would be, if artificial life support measures should become necessary.  it is critically important to prevent falling down (keep floor areas well-lit, dry, and free of loose objects.  If you have a cane, walker, or wheelchair, you should use it, even for short trips around the house.  Also, try not to rush) blood tests are being requested for you today.  please call [phone] to hear your test results.  You will be prompted to enter the 9-digit "MRN" number that appears at the top left of this page, followed by #.  Then you will hear the message. You should have a vaccine against shingles (a painful rash which results from the  chickenpox infection which most people had many years ago).  This vaccine reduces, but does not totally eliminate the risk of shingles.  Because this is a medicare part d benefit, there are 3 ways you can get it:  You can go to a pharmacy and get the injection (I can give you a prescription), or I can give you a prescription to have filled at a pharmacy, and bring back here for [name] to give.  The other option is that you can pay up-front for it, and we'll give you a form to make a claim for reimbursement from your medicare part d carrier.  Please come back for a follow-up appointment in 6 months. blood tests are being requested for you today.  please call [phone] to hear your test results.  You will be prompted to enter the 9-digit "MRN" number that appears at the top left of this page, followed by #.  Then you will hear  the message.

## 2010-12-17 DIAGNOSIS — Z0000 Encounter for general adult medical examination without abnormal findings: Secondary | ICD-10-CM | POA: Insufficient documentation

## 2011-02-16 ENCOUNTER — Ambulatory Visit (INDEPENDENT_AMBULATORY_CARE_PROVIDER_SITE_OTHER): Payer: Medicare Other | Admitting: Endocrinology

## 2011-02-16 ENCOUNTER — Encounter: Payer: Self-pay | Admitting: Endocrinology

## 2011-02-16 VITALS — BP 122/62 | HR 61 | Temp 97.8°F | Ht 69.0 in | Wt 199.0 lb

## 2011-02-16 DIAGNOSIS — R0602 Shortness of breath: Secondary | ICD-10-CM

## 2011-02-16 DIAGNOSIS — D510 Vitamin B12 deficiency anemia due to intrinsic factor deficiency: Secondary | ICD-10-CM

## 2011-02-16 MED ORDER — CYANOCOBALAMIN 1000 MCG/ML IJ SOLN
1000.0000 ug | Freq: Once | INTRAMUSCULAR | Status: AC
  Administered 2011-02-16: 1000 ug via INTRAMUSCULAR

## 2011-02-16 MED ORDER — CEFUROXIME AXETIL 250 MG PO TABS: 250.0000 mg | ORAL_TABLET | Freq: Two times a day (BID) | ORAL | Status: AC

## 2011-02-16 NOTE — Progress Notes (Signed)
Subjective:    Patient ID: [name], male    DOB: 03-Jan-1931, 78 y.o.   MRN: [id number]  HPI Pt states few weeks of moderate dry-quality cough in the chest, but no assoc fever.   Past Medical History  Diagnosis Date  . Coronary artery disease   . Leukopenia   . Smoker     quit 1968--25 ppy  . H/O: asbestos exposure   . Pernicious anemia   . ANEMIA, IRON DEFICIENCY   . COPD (chronic obstructive pulmonary disease)   . BACK PAIN, LUMBAR   . Hypertension   . GERD (gastroesophageal reflux disease)   . DIABETES MELLITUS, TYPE II   . Charcot-Marie-Tooth disease     assoiciated muscular dystrophy     Past Surgical History  Procedure Date  . Coronary artery bypass graft 1995    History   Social History  . Marital Status: Widowed    Spouse Name: N/A    Number of Children: N/A  . Years of Education: N/A   Occupational History  . Retired     Games developer   Social History Main Topics  . Smoking status: Former Smoker -- 1.0 packs/day    Types: Cigarettes    Quit date: 01/01/1966  . Smokeless tobacco: Not on file  . Alcohol Use: No  . Drug Use: No  . Sexually Active: Not on file   Other Topics Concern  . Not on file   Social History Narrative   Was in service during [place] War    Current Outpatient Prescriptions on File Prior to Visit  Medication Sig Dispense Refill  . Ascorbic Acid (VITAMIN C) 500 MG tablet Take 500 mg by mouth daily.        Marland Kitchen aspirin 325 MG tablet Take 325 mg by mouth daily.        . clotrimazole-betamethasone (LOTRISONE) cream Apply topically 2 (two) times daily as needed.       . cyanocobalamin 1000 MCG tablet Take 500 mcg by mouth 2 (two) times daily.       Marland Kitchen dexlansoprazole (KAPIDEX) 60 MG capsule Take 1 capsule (60 mg total) by mouth daily.  90 capsule  3  . ferrous sulfate (FERRO-BOB) 325 (65 FE) MG tablet Take 325 mg by mouth 3 (three) times daily with meals.       . metFORMIN (GLUCOPHAGE) 1000 MG tablet Take 1 tablet (1,000 mg  total) by mouth 2 (two) times daily with a meal.  360 tablet  3  . montelukast (SINGULAIR) 10 MG tablet Take 1 tablet (10 mg total) by mouth at bedtime.  90 tablet  3  . Multiple Vitamins-Minerals (MULTIVITAMIN WITH MINERALS) tablet Take 1 tablet by mouth daily.        . niacin 500 MG tablet Take 500 mg by mouth daily with breakfast.        . nitroGLYCERIN (NITROSTAT) 0.4 MG SL tablet Place 1 tablet (0.4 mg total) under the tongue every 5 (five) minutes as needed for chest pain.  25 tablet  3  . sertraline (ZOLOFT) 50 MG tablet Take 1 tablet (50 mg total) by mouth daily.  90 tablet  3  . simvastatin (ZOCOR) 80 MG tablet Take 1 tablet (80 mg total) by mouth at bedtime.  90 tablet  3  . traMADol (ULTRAM) 50 MG tablet Take 1 tablet (50 mg total) by mouth every 6 (six) hours as needed.  100 tablet  3  . Zinc 50 MG CAPS Take 1 capsule  by mouth.         No current facility-administered medications on file prior to visit.    No Known Allergies  Family History  Problem Relation Age of Onset  . Charcot-Marie-Tooth disease Sister   . Charcot-Marie-Tooth disease Brother   . Charcot-Marie-Tooth disease Brother   . Charcot-Marie-Tooth disease Brother   . Charcot-Marie-Tooth disease Sister   . Charcot-Marie-Tooth disease Son   . Charcot-Marie-Tooth disease Other     BP 122/62  Pulse 61  Temp(Src) 97.8 F (36.6 C) (Oral)  Ht 5\' 9"  (1.753 m)  Wt 199 lb (90.266 kg)  BMI 29.39 kg/m2  SpO2 95%    Review of Systems No change in chronic doe.  Denies sore throat and earache.      Objective:   Physical Exam VITAL SIGNS:  See vs page GENERAL: no distress head: no deformity eyes: no periorbital swelling, no proptosis external nose and ears are normal mouth: no lesion seen Both eac's and tm's are normal LUNGS:  Clear to auscultation       Assessment & Plan:  Chronic restrictive lung dx, persistent.  He needs pulm f/u Acute bronchitis, new

## 2011-02-16 NOTE — Patient Instructions (Addendum)
Refer back to dr Izell [place].  you will receive a phone call, about a day and time for an appointment. i have sent a prescription to your pharmacy, for an antibiotic. I hope you feel better soon.  If you don't feel better by next week, please call back.

## 2011-02-21 ENCOUNTER — Ambulatory Visit: Payer: Medicare Other | Admitting: Internal Medicine

## 2011-03-13 ENCOUNTER — Encounter: Payer: Self-pay | Admitting: Internal Medicine

## 2011-03-13 ENCOUNTER — Ambulatory Visit (INDEPENDENT_AMBULATORY_CARE_PROVIDER_SITE_OTHER): Payer: Medicare Other | Admitting: Internal Medicine

## 2011-03-13 ENCOUNTER — Ambulatory Visit (HOSPITAL_COMMUNITY)
Admission: RE | Admit: 2011-03-13 | Discharge: 2011-03-13 | Disposition: A | Discharge status: Home / Self Care | Payer: Medicare Other | Source: Ambulatory Visit | Attending: Internal Medicine | Admitting: Internal Medicine

## 2011-03-13 VITALS — BP 130/80 | HR 63 | Temp 98.3°F | Ht 69.5 in | Wt 202.4 lb

## 2011-03-13 DIAGNOSIS — R0602 Shortness of breath: Secondary | ICD-10-CM

## 2011-03-13 DIAGNOSIS — R0609 Other forms of dyspnea: Secondary | ICD-10-CM | POA: Insufficient documentation

## 2011-03-13 DIAGNOSIS — R0989 Other specified symptoms and signs involving the circulatory and respiratory systems: Secondary | ICD-10-CM | POA: Insufficient documentation

## 2011-03-13 LAB — BLOOD GAS, ARTERIAL
Acid-base deficit: 1.1 mmol/L (ref 0.0–2.0)
Drawn by: 24610
FIO2: 0.21 %
Patient temperature: 98.6
TCO2: 21.2 mmol/L (ref 0–100)
pH, Arterial: 7.387 (ref 7.350–7.450)

## 2011-03-13 NOTE — Progress Notes (Signed)
Subjective:    Patient ID: [name], male    DOB: 05/05/1931, 78 y.o.   MRN: [id number]  HPI .   History of Present Illness:  IOV 08/14/2007: 78 year old male with Charcot Marie Tooth referred by Dr. Juanda Chance. Initially sent to Dr. Juanda Chance by Dr. Everardo All for chest pains. Had nuclear mediciine stress test and was normal (of note, s/p cabg). During this time, patient c/o dyspnea and therefore referred here. Diagnosed to have Charcot Marie Tooth Disease in 06-25-1980 (following RLE fracture). Was following with Dr. Billy Fischer here at [facility] and was told in 1999/2000 that his "diaphragms were dead". Has been on CPAP since 06/25/97.  C/O Dyspnea for >7 years. But, has been gradually progressive for 2 years. Two years ago could walk around the mall. One year ago could do only half the mall. However, this year is unable to bend and gets dyspneic changing clothes or talking too much. Also, has difficulty standing up. However, is able to still drive his car.  Denies ohter respiratory complaints such as cough, no change in 3 pillow orthopnea, cpap usage, hemoptysis, no change in RLE edema (s/p bypass), fever, chills, weight loss.   FOLLOWUP 9.14.2009. Here to review ABG and PFts. Does not feel any better. ABG is normal. PFTs shows 290 cc decline in Fev1 in 5 yuears and 330cc decline in FVC in 5 years. This is approx 60cc per year which is slightly higher than age related decline. In addition, TLC and DLCO are reduced. Of note, in 06/26/2002 he did have 12% BD reactivity in Fev1 (see pmhx). REC QVAR   OV 05/03/2008: Followup after long time for Charcot Marie Tooth disease. Wife and daughter here. All very concerned about his health. Now has progressive decline in functional status. Falling easy. Dropping things easy. More clumsy. Family unable to cope. Once fell down on porch and passerby had to help him. Not able to drive well. Acutely some days ago, he was trying to drill a nail and got acutely dyspneic and  nearly passed out. Refused EMS call/help. Dyspnea lasted 30 minutes. No other symptoms at that time. More dyspneic overall with activities. Not taking QVAR as told. REC: CXR, ABG, Home PT   OV 07/12/2008: FU dyspnea from neurmuscular disease and possible obstructive airway disease NOS. Saw neuromuscular MD at [facility] on May 17. 2010. They feel that titration of bipap, and speech/resp therapy help wiht relaxation techniwues for possible LPR should also be tried. Today. Mr Reinig reports more progression in DOE. Daughter says he panics a lot. STill waiting for bipap titration. No other new complaints. Home PT is going well..    Patient Instructions:  1) please meet with our [facility] to call your home health company and have your bipap titration set up  2) attend pulmonary rehab at [place]  3) rov 4 months      OV 03/13/2011  FU dyspnea from neurmuscular disease and possible obstructive airway disease NOS. Not seen him in 2.5 years. Here with wife and daughter. REports slowly progressive dyspnea since last visit. Still ocassionally drives car but less than before. Changing clothes, talking a lot on phone and post prandial fullness makes him dyspneic. Still able to do ADLs. ECOG 3. Using bipap. Had home PT in June 25, 2010that helped but did not attend pulmonar rehab as advised in July 2010. Denies dysphagia,  chest pain, nausea, vomit, diarrhea, syncope, rash, dysuria, seizures, wt change, masses or lumps. In reviewing prior notes  his symptoms severity is the same as before though he subjectively feels worse. Says Dr Alphonzo Dublin the neurologist at [facility] is discussing trach. He wants to avoid it if possible though will accept it and peg because he values his quantity of life. He is looking for options to prevent trach. Says he discused diaphragmatic pacer with DR Caress and he is not a candidate for it. He continues with bipap  Past, Family, Social reviewed: no other medical issues since last visit. 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 Negative for fever.  HENT: Positive for rhinorrhea and sinus pressure. Negative for ear pain, nosebleeds, congestion, sore throat, sneezing, trouble swallowing, dental problem and postnasal drip.   Eyes: Negative for redness and itching.  Respiratory: Positive for chest tightness and shortness of breath. Negative for cough and wheezing.   Cardiovascular: Positive for palpitations. Negative for leg swelling.  Gastrointestinal: Negative for nausea and vomiting.  Genitourinary: Negative for dysuria.  Musculoskeletal: Negative for joint swelling.  Skin: Negative for rash.  Neurological: Negative for headaches.  Hematological: Bruises/bleeds easily.  Psychiatric/Behavioral: Negative for dysphoric mood. The patient is nervous/anxious.        Objective:   Physical Exam eneral: normal appearance. No obvious change since 2010 Head: head is normocephalic  eyes: no scleral icterus  no periorbital swelling  perrl  external ears are normal  nose normal externally  mouth has no lesion, including normal tongue  Eyes: no gross abnormality of the eyes. no periorbital swelling, and no scleral icterus  Ears: external ears are normal  Nose: no gross abnormality of the nose  Mouth: no lesion of the oral mucosa. no erythema. mucous membranes are moist  Neck: no masses, thyromegaly, or abnormal cervical nodes  Chest Wall: scar of CABG +  Lungs: clear to auscultation, but decreased bs at bases. no respiratory distress  Heart: regular rate and rhythm, S1, S2 without murmurs, rubs, gallops, or clicks  Abdomen: abdomen is soft, nontender. no hepatosplenomegaly.  not  distended. no hernia  Msk: no deformity or scoliosis noted with normal posture.  gait is slow and sllightly unsteady  Pulses: dorsalis pedis intact bilat, but decreased from normal. no carotid bruit  Extremities: no ulcer on the feet. feet are of normal color and temp. no edema. has healed vein harvest scar right leg. has severe bilat onychomycosis.  wears bilat leg braces  muscle wasting in web of fingers +  Neurologic: cn 2-12 grossly intact. readily moves all 4's, but has chronic leg weakness, left worse than right.  Skin: normal texture and temp. no rash. not diaphoretic  Cervical Nodes: no significant adenopathy  Psych: alert and cooperative; normal mood and affect; normal attention span and concentration         Assessment & Plan:

## 2011-03-13 NOTE — Patient Instructions (Signed)
At this point CXR August 2012 there is no evidence of asbestos exposure I think your shortness of breath is likely due to neuromuscular disease and might be a fitness issue too Please have full PFT breathing test Please have blood gast test Return to see me after above When you come back we will compare these numbers against prior numbers and discuss next step in management plan

## 2011-03-13 NOTE — Assessment & Plan Note (Signed)
Subjectively worsening dyspnea. Class 3 seveity with orthopnea. How much of this is deconditioning v ALS I am not sure. They are discussing trach with him but based on 2009 and 2010 numbers on abg and pft and functional status he does not need it. Will get repeat PFT, ABG and reasess

## 2011-03-16 ENCOUNTER — Ambulatory Visit: Payer: Medicare Other

## 2011-03-19 ENCOUNTER — Ambulatory Visit (INDEPENDENT_AMBULATORY_CARE_PROVIDER_SITE_OTHER): Payer: Medicare Other | Admitting: Internal Medicine

## 2011-03-19 ENCOUNTER — Ambulatory Visit (INDEPENDENT_AMBULATORY_CARE_PROVIDER_SITE_OTHER): Payer: Medicare Other | Admitting: *Deleted

## 2011-03-19 DIAGNOSIS — D510 Vitamin B12 deficiency anemia due to intrinsic factor deficiency: Secondary | ICD-10-CM

## 2011-03-19 DIAGNOSIS — R0602 Shortness of breath: Secondary | ICD-10-CM

## 2011-03-19 LAB — PULMONARY FUNCTION TEST

## 2011-03-19 MED ORDER — CYANOCOBALAMIN 1000 MCG/ML IJ SOLN
1000.0000 ug | Freq: Once | INTRAMUSCULAR | Status: AC
  Administered 2011-03-19: 1000 ug via INTRAMUSCULAR

## 2011-03-19 NOTE — Progress Notes (Signed)
PFT done today. 

## 2011-03-23 ENCOUNTER — Telehealth: Payer: Self-pay | Admitting: Internal Medicine

## 2011-03-23 NOTE — Telephone Encounter (Signed)
Spoke with pt and scheduled ov with MR for 05/02/11 @ 4:15 (this was first available). Pt ok with this date. I advised to call sooner if starts to have problems. Pt states nothing further needed.

## 2011-03-28 ENCOUNTER — Telehealth: Payer: Self-pay | Admitting: Internal Medicine

## 2011-03-28 NOTE — Telephone Encounter (Signed)
PFts 03/19/11   FVC 1.6L/44%, Fev1 1.16L/48%, Ratio 71, 18% BD response in fev1. TLC 3.86L/68%, DLCO 12.7/65%  Overall this is NO CHANGE since 2010. Therefore, does not make sense why he is more short of breath'  I will review at fu 05/02/11

## 2011-03-29 NOTE — Telephone Encounter (Signed)
Pt aware. [name], CMA  

## 2011-05-02 ENCOUNTER — Encounter: Payer: Self-pay | Admitting: Internal Medicine

## 2011-05-02 ENCOUNTER — Ambulatory Visit (INDEPENDENT_AMBULATORY_CARE_PROVIDER_SITE_OTHER): Payer: Medicare Other | Admitting: Internal Medicine

## 2011-05-02 VITALS — BP 128/70 | HR 60 | Temp 97.8°F | Ht 69.0 in | Wt 199.0 lb

## 2011-05-02 DIAGNOSIS — R0602 Shortness of breath: Secondary | ICD-10-CM

## 2011-05-02 NOTE — Patient Instructions (Signed)
I do not uunderstand why your abg and breathing tests are unchanged from 2010 but your breathing difficulty is worse BEst way to sort out is to attend rehab but I respect your decision not to attend it Therefore next best way is to do a bike pulmonary stress test [facility] by [facility] [name]  - Mr [name] will see how you do on the bike before proceeding with the test I will see you after test

## 2011-05-02 NOTE — Progress Notes (Signed)
Subjective:    Patient ID: [name], male    DOB: [birth year], 78 y.o.   MRN: [id number]  HPI .   History of Present Illness:  IOV 08/14/2007: 78 year old male with Charcot Marie Tooth referred by Dr. Juanda Chance. Initially sent to Dr. Juanda Chance by Dr. Everardo All for chest pains. Had nuclear mediciine stress test and was normal (of note, s/p cabg). During this time, patient c/o dyspnea and therefore referred here. Diagnosed to have Charcot Marie Tooth Disease in 1980-06-23 (following RLE fracture). Was following with Dr. Billy Fischer here at [facility] and was told in 1999/2000 that his "diaphragms were dead". Has been on CPAP since 1997/06/23.  C/O Dyspnea for >7 years. But, has been gradually progressive for 2 years. Two years ago could walk around the mall. One year ago could do only half the mall. However, this year is unable to bend and gets dyspneic changing clothes or talking too much. Also, has difficulty standing up. However, is able to still drive his car.  Denies ohter respiratory complaints such as cough, no change in 3 pillow orthopnea, cpap usage, hemoptysis, no change in RLE edema (s/p bypass), fever, chills, weight loss.   FOLLOWUP 9.14.2009. Here to review ABG and PFts. Does not feel any better. ABG is normal. PFTs shows 290 cc decline in Fev1 in 5 yuears and 330cc decline in FVC in 5 years. This is approx 60cc per year which is slightly higher than age related decline. In addition, TLC and DLCO are reduced. Of note, in 2002/06/24 he did have 12% BD reactivity in Fev1 (see pmhx). REC QVAR   OV 05/03/2008: Followup after long time for Charcot Marie Tooth disease. Wife and daughter here. All very concerned about his health. Now has progressive decline in functional status. Falling easy. Dropping things easy. More clumsy. Family unable to cope. Once fell down on porch and passerby had to help him. Not able to drive well. Acutely some days ago, he was trying to drill a nail and got acutely dyspneic and  nearly passed out. Refused EMS call/help. Dyspnea lasted 30 minutes. No other symptoms at that time. More dyspneic overall with activities. Not taking QVAR as told. REC: CXR, ABG, Home PT   OV 07/12/2008: FU dyspnea from neurmuscular disease and possible obstructive airway disease NOS. Saw neuromuscular MD at [facility] on May 17. 2010. They feel that titration of bipap, and speech/resp therapy help wiht relaxation techniwues for possible LPR should also be tried. Today. [name] reports more progression in DOE. Daughter says he panics a lot. STill waiting for bipap titration. No other new complaints. Home PT is going well..    Patient Instructions:  1) please meet with our [facility] to call your home health company and have your bipap titration set up  2) attend pulmonary rehab at [place]  3) rov 4 months      OV 03/13/2011  FU dyspnea from neurmuscular disease and possible obstructive airway disease NOS. Not seen him in 2.5 years. Here with wife and daughter. REports slowly progressive dyspnea since last visit. Still ocassionally drives car but less than before. Changing clothes, talking a lot on phone and post prandial fullness makes him dyspneic. Still able to do ADLs. ECOG 3. Using bipap. Had home PT in 06/23/2010that helped but did not attend pulmonar rehab as advised in July 2010. Denies dysphagia,  chest pain, nausea, vomit, diarrhea, syncope, rash, dysuria, seizures, wt change, masses or lumps. In reviewing prior notes  his symptoms severity is the same as before though he subjectively feels worse. Says Dr Alphonzo Dublin the neurologist at [facility] is discussing trach. He wants to avoid it if possible though will accept it and peg because he values his quantity of life. He is looking for options to prevent trach. Says he discused diaphragmatic pacer with DR Caress and he is not a candidate for it. He continues with bipap  Past, Family, Social reviewed: no other medical issues since last visit. 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   CXR Aguust 2012  - small lung volumes, no pleural plaques  LABS ABG 2009  - 7.4/39/84  PFTS  FVC 09/02/07 -> 05/03/08  FVC 1.47L/38% >-> 1.5L/39%  Fev1 1.06L/41% -> 1.17L/46%  TLC 3.75L/65% -> X  DLCO 13.3/62% -> X  REC At this point CXR August 2012 there is no evidence of asbestos exposure  I think your shortness of breath is likely due to neuromuscular disease and might be a fitness issue too  Please have full PFT breathing test  Please have blood gast test  Return to see me after above  When you come back we will compare these numbers against prior numbers and discuss next step in management plan  OV 05/02/2011 FU dyspnea   -.  ABG and resting PFT no change from several years ago.    ABG 03/09/11  7.38/38/70s - no change from 3 years agpo  PFts 03/19/11  -  FVC 1.6L/44%, Fev1 1.16L/48%, Ratio 71, 18% BD response in fev1. TLC 3.86L/68%, DLCO 12.7/65%. Overall this is NO CHANGE since 2010.    Dyspnea   - slowly progressive but, same as few months ago., comes on with doing laundry but not for change of clothes. Was able to drive here from home. STill he feels subjectively dyspnea is progressive. Objectively in terms of resting pft and resting abg and in terms of notes of his effort tolerance things seem the same. But subjectively he is worse. HE does not want to attend pulmonary rehab. He is frustrated by dyspnea progression. Wants specific answers. Willing to even try out CPST to get to bottom of dyspnea etiology  Past, Family, Social reviewed: no change since last visit     Review of Systems  Constitutional: Negative for fever and unexpected weight change.  HENT: Negative for ear pain, nosebleeds, congestion, sore throat, rhinorrhea, sneezing, trouble swallowing, dental problem, postnasal drip and sinus pressure.   Eyes: Negative for redness and itching.  Respiratory: Negative for cough, chest tightness, shortness of breath and wheezing.    Cardiovascular: Negative for palpitations and leg swelling.  Gastrointestinal: Negative for nausea and vomiting.  Genitourinary: Negative for dysuria.  Musculoskeletal: Negative for joint swelling.  Skin: Negative for rash.  Neurological: Negative for headaches.  Hematological: Does not bruise/bleed easily.  Psychiatric/Behavioral: Negative for dysphoric mood. The patient is not nervous/anxious.        Objective:   Physical Exam  eneral: normal appearance. No obvious change since 2010 Head: head is normocephalic  eyes: no scleral icterus  no periorbital swelling  perrl  external ears are normal  nose normal externally  mouth has no lesion, including normal tongue  Eyes: no gross abnormality of the eyes. no periorbital swelling, and no scleral icterus  Ears: external ears are normal  Nose: no gross abnormality of the nose  Mouth: no lesion of the oral mucosa. no erythema. mucous membranes are moist  Neck: no masses, thyromegaly, or abnormal cervical nodes  Chest  Wall: scar of CABG +  Lungs: clear to auscultation, but decreased bs at bases. no respiratory distress  Heart: regular rate and rhythm, S1, S2 without murmurs, rubs, gallops, or clicks  Abdomen: abdomen is soft, nontender. no hepatosplenomegaly.  not distended. no hernia  Msk: no deformity or scoliosis noted with normal posture.  gait is slow and sllightly unsteady  Pulses: dorsalis pedis intact bilat, but decreased from normal. no carotid bruit  Extremities: no ulcer on the feet. feet are of normal color and temp. no edema. has healed vein harvest scar right leg. has severe bilat onychomycosis.  wears bilat leg braces  muscle wasting in web of fingers +  Neurologic: cn 2-12 grossly intact. readily moves all 4's, but has chronic leg weakness, left worse than right.  Skin: normal texture and temp. no rash. not diaphoretic  Cervical Nodes: no significant adenopathy  Psych: alert and cooperative; normal mood and  affect; normal attention span and concentration          Assessment & Plan:

## 2011-05-06 ENCOUNTER — Encounter: Payer: Self-pay | Admitting: Internal Medicine

## 2011-05-06 NOTE — Assessment & Plan Note (Signed)
His dyspnea is likely due to neuromuscular disease and deconditioning but unclear why it is progressive when resting abg and resting pft are static compared to 2 years ago. I suggested he is deconditionned and he attend rehab but he is not willing. He is more interested in testing to sort out other etiologies for dyspnea (? Neuromuscular fatigue that worsens when hhe exerts or cardiac). I explained CPST bike test. He says he cannot do treadmill but thinks he can bike. Will set this up  > 50% of this > 15 min visit spent in face to face counseling

## 2011-05-09 ENCOUNTER — Encounter (INDEPENDENT_AMBULATORY_CARE_PROVIDER_SITE_OTHER): Payer: Medicare Other | Admitting: Ophthalmology

## 2011-05-09 DIAGNOSIS — E11319 Type 2 diabetes mellitus with unspecified diabetic retinopathy without macular edema: Secondary | ICD-10-CM

## 2011-05-09 DIAGNOSIS — H35439 Paving stone degeneration of retina, unspecified eye: Secondary | ICD-10-CM

## 2011-05-09 DIAGNOSIS — H35039 Hypertensive retinopathy, unspecified eye: Secondary | ICD-10-CM

## 2011-05-09 DIAGNOSIS — I10 Essential (primary) hypertension: Secondary | ICD-10-CM

## 2011-05-09 DIAGNOSIS — H2510 Age-related nuclear cataract, unspecified eye: Secondary | ICD-10-CM

## 2011-05-09 DIAGNOSIS — E1139 Type 2 diabetes mellitus with other diabetic ophthalmic complication: Secondary | ICD-10-CM

## 2011-05-09 DIAGNOSIS — H43819 Vitreous degeneration, unspecified eye: Secondary | ICD-10-CM

## 2011-05-14 ENCOUNTER — Ambulatory Visit (HOSPITAL_COMMUNITY): Payer: Medicare Other | Attending: Internal Medicine

## 2011-05-14 DIAGNOSIS — R0609 Other forms of dyspnea: Secondary | ICD-10-CM | POA: Insufficient documentation

## 2011-05-14 DIAGNOSIS — R0602 Shortness of breath: Secondary | ICD-10-CM

## 2011-05-14 DIAGNOSIS — R0989 Other specified symptoms and signs involving the circulatory and respiratory systems: Secondary | ICD-10-CM | POA: Insufficient documentation

## 2011-05-17 ENCOUNTER — Ambulatory Visit (INDEPENDENT_AMBULATORY_CARE_PROVIDER_SITE_OTHER): Payer: Medicare Other | Admitting: Internal Medicine

## 2011-05-17 ENCOUNTER — Encounter: Payer: Self-pay | Admitting: Internal Medicine

## 2011-05-17 VITALS — BP 130/70 | HR 71 | Temp 97.6°F | Ht 69.0 in | Wt 193.6 lb

## 2011-05-17 DIAGNOSIS — R0602 Shortness of breath: Secondary | ICD-10-CM

## 2011-05-17 NOTE — Progress Notes (Signed)
Subjective:    Patient ID: [name], male    DOB: [birth year], 78 y.o.   MRN: [id number]  HPI .   History of Present Illness:  IOV 08/14/2007: 79 year old male with Charcot Marie Tooth referred by Dr. Juanda Chance. Initially sent to Dr. Juanda Chance by Dr. Everardo All for chest pains. Had nuclear mediciine stress test and was normal (of note, s/p cabg). During this time, patient c/o dyspnea and therefore referred here. Diagnosed to have Charcot Marie Tooth Disease in 06/24/80 (following RLE fracture). Was following with Dr. Billy Fischer here at [facility] and was told in 1999/2000 that his "diaphragms were dead". Has been on CPAP since 06/24/97.  C/O Dyspnea for >7 years. But, has been gradually progressive for 2 years. Two years ago could walk around the mall. One year ago could do only half the mall. However, this year is unable to bend and gets dyspneic changing clothes or talking too much. Also, has difficulty standing up. However, is able to still drive his car.  Denies ohter respiratory complaints such as cough, no change in 3 pillow orthopnea, cpap usage, hemoptysis, no change in RLE edema (s/p bypass), fever, chills, weight loss.   FOLLOWUP 9.14.2009. Here to review ABG and PFts. Does not feel any better. ABG is normal. PFTs shows 290 cc decline in Fev1 in 5 yuears and 330cc decline in FVC in 5 years. This is approx 60cc per year which is slightly higher than age related decline. In addition, TLC and DLCO are reduced. Of note, in 06/25/02 he did have 12% BD reactivity in Fev1 (see pmhx). REC QVAR   OV 05/03/2008: Followup after long time for Charcot Marie Tooth disease. Wife and daughter here. All very concerned about his health. Now has progressive decline in functional status. Falling easy. Dropping things easy. More clumsy. Family unable to cope. Once fell down on porch and passerby had to help him. Not able to drive well. Acutely some days ago, he was trying to drill a nail and got acutely dyspneic and  nearly passed out. Refused EMS call/help. Dyspnea lasted 30 minutes. No other symptoms at that time. More dyspneic overall with activities. Not taking QVAR as told. REC: CXR, ABG, Home PT   OV 07/12/2008: FU dyspnea from neurmuscular disease and possible obstructive airway disease NOS. Saw neuromuscular MD at [facility] on May 17. 2010. They feel that titration of bipap, and speech/resp therapy help wiht relaxation techniwues for possible LPR should also be tried. Today. [name] reports more progression in DOE. Daughter says he panics a lot. STill waiting for bipap titration. No other new complaints. Home PT is going well..    Patient Instructions:  1) please meet with our [facility] to call your home health company and have your bipap titration set up  2) attend pulmonary rehab at [place]  3) rov 4 months      OV 03/13/2011  FU dyspnea from neurmuscular disease and possible obstructive airway disease NOS. Not seen him in 2.5 years. Here with wife and daughter. REports slowly progressive dyspnea since last visit. Still ocassionally drives car but less than before. Changing clothes, talking a lot on phone and post prandial fullness makes him dyspneic. Still able to do ADLs. ECOG 3. Using bipap. Had home PT in 06-24-10that helped but did not attend pulmonar rehab as advised in July 2010. Denies dysphagia,  chest pain, nausea, vomit, diarrhea, syncope, rash, dysuria, seizures, wt change, masses or lumps. In reviewing prior notes  his symptoms severity is the same as before though he subjectively feels worse. Says Dr Alphonzo Dublin the neurologist at [facility] is discussing trach. He wants to avoid it if possible though will accept it and peg because he values his quantity of life. He is looking for options to prevent trach. Says he discused diaphragmatic pacer with DR Caress and he is not a candidate for it. He continues with bipap  Past, Family, Social reviewed: no other medical issues since last visit. 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   CXR Aguust 2012  - small lung volumes, no pleural plaques  LABS ABG 2009  - 7.4/39/84  PFTS  FVC 09/02/07 -> 05/03/08  FVC 1.47L/38% >-> 1.5L/39%  Fev1 1.06L/41% -> 1.17L/46%  TLC 3.75L/65% -> X  DLCO 13.3/62% -> X  REC At this point CXR August 2012 there is no evidence of asbestos exposure  I think your shortness of breath is likely due to neuromuscular disease and might be a fitness issue too  Please have full PFT breathing test  Please have blood gast test  Return to see me after above  When you come back we will compare these numbers against prior numbers and discuss next step in management plan  OV 05/02/2011 FU dyspnea   -.  ABG and resting PFT no change from several years ago.    ABG 03/09/11  7.38/38/70s - no change from 3 years agpo  PFts 03/19/11  -  FVC 1.6L/44%, Fev1 1.16L/48%, Ratio 71, 18% BD response in fev1. TLC 3.86L/68%, DLCO 12.7/65%. Overall this is NO CHANGE since 2010.    Dyspnea   - slowly progressive but, same as few months ago., comes on with doing laundry but not for change of clothes. Was able to drive here from home. STill he feels subjectively dyspnea is progressive. Objectively in terms of resting pft and resting abg and in terms of notes of his effort tolerance things seem the same. But subjectively he is worse. HE does not want to attend pulmonary rehab. He is frustrated by dyspnea progression. Wants specific answers. Willing to even try out CPST to get to bottom of dyspnea etiology  Past, Family, Social reviewed: no change since last visit  REC I do not uunderstand why your abg and breathing tests are unchanged from 2010 but your breathing difficulty is worse  BEst way to sort out is to attend rehab but I respect your decision not to attend it  Therefore next best way is to do a bike pulmonary stress test called [facility] test [facility] by [name]  - [name] will see how you do on the bike before proceeding  with the test  I will see you after test  OV 05/17/2011 CPST review: Discussion only visit  CPST done 05/14/11  - RER 1.1  - VO2 max reduced 53ml/kg/min 55% predicted  -AT 32% and reduced  - Ventilatory limitation reached & there was 22% drop in Fev1 with 19% drop in FVC post exercise challenge  - HRR 45 beats and inadequate heart rate response reaching only 69% predicted, STroke volume 77% - Dead space Ve/vco2 - 77% and normal -   Review of Systems  Constitutional: Negative for fever and unexpected weight change.  HENT: Negative for ear pain, nosebleeds, congestion, sore throat, rhinorrhea, sneezing, trouble swallowing, dental problem, postnasal drip and sinus pressure.   Eyes: Negative for redness and itching.  Respiratory: Negative for cough, chest tightness, shortness of breath and wheezing.  Cardiovascular: Negative for palpitations and leg swelling.  Gastrointestinal: Negative for nausea and vomiting.  Genitourinary: Negative for dysuria.  Musculoskeletal: Negative for joint swelling.  Skin: Negative for rash.  Neurological: Negative for headaches.  Hematological: Does not bruise/bleed easily.  Psychiatric/Behavioral: Negative for dysphoric mood. The patient is not nervous/anxious.        Objective:   Physical Exam General: usual appearance. No obvious change since 2010 Discussion only visit     Assessment & Plan:

## 2011-05-17 NOTE — Patient Instructions (Signed)
Your bike test shows breathing difficulty is due to [name] and/or asthma or both Please try dulera sample 2 puff twice daily - take sample for 2 months and show technique to nurse Call me in 4 weeks to state response Followup based on your call

## 2011-05-20 NOTE — Assessment & Plan Note (Signed)
Dyspnea is due to respiratory limitation either neuromuscular disease with or without exercise induced bronchospasm. He had failed qvar in past. He does not think this is asthma. He thinks this is due to neuromuscular disease. Counseled and he is willing to try sample ICS/LABA. He will call me if there is a positive benefit. He seemed resigned that he has progressive neuromuscular diseae though I reminded him that on objective tests at rest there is no difference and most sensitive way to find out is to follow with cpst in a year. Encouraged rehab too. However, he is not willing and wants to keep a prn approach to followup  > 50% of this > 15 min visit spent in face to face counseling

## 2011-06-15 ENCOUNTER — Other Ambulatory Visit: Payer: Self-pay | Admitting: *Deleted

## 2011-06-15 MED ORDER — TRAMADOL HCL 50 MG PO TABS: 50.0000 mg | ORAL_TABLET | Freq: Four times a day (QID) | ORAL | Status: DC | PRN

## 2011-06-15 NOTE — Telephone Encounter (Signed)
R'cd fax from [facility] for refill of Tramadol

## 2011-12-03 ENCOUNTER — Telehealth: Payer: Self-pay | Admitting: Endocrinology

## 2011-12-03 ENCOUNTER — Other Ambulatory Visit: Payer: Self-pay | Admitting: Endocrinology

## 2011-12-03 NOTE — Telephone Encounter (Signed)
Labs aren't due until dec 11--ins won't pay until then.  i'll order when you are here.

## 2011-12-03 NOTE — Telephone Encounter (Signed)
Pt advised adn states an understanding

## 2011-12-03 NOTE — Telephone Encounter (Signed)
Patient would like to have orders to have his cpx labs done [name]. Please assist.

## 2011-12-05 ENCOUNTER — Other Ambulatory Visit: Payer: Medicare Other

## 2011-12-11 ENCOUNTER — Other Ambulatory Visit (INDEPENDENT_AMBULATORY_CARE_PROVIDER_SITE_OTHER): Payer: Medicare Other

## 2011-12-11 ENCOUNTER — Ambulatory Visit (INDEPENDENT_AMBULATORY_CARE_PROVIDER_SITE_OTHER): Payer: Medicare Other | Admitting: Endocrinology

## 2011-12-11 ENCOUNTER — Encounter: Payer: Self-pay | Admitting: Endocrinology

## 2011-12-11 VITALS — BP 126/80 | HR 63 | Temp 97.8°F | Wt 184.0 lb

## 2011-12-11 DIAGNOSIS — F3289 Other specified depressive episodes: Secondary | ICD-10-CM

## 2011-12-11 DIAGNOSIS — I10 Essential (primary) hypertension: Secondary | ICD-10-CM

## 2011-12-11 DIAGNOSIS — Z125 Encounter for screening for malignant neoplasm of prostate: Secondary | ICD-10-CM

## 2011-12-11 DIAGNOSIS — D509 Iron deficiency anemia, unspecified: Secondary | ICD-10-CM

## 2011-12-11 DIAGNOSIS — Z23 Encounter for immunization: Secondary | ICD-10-CM

## 2011-12-11 DIAGNOSIS — Z79899 Other long term (current) drug therapy: Secondary | ICD-10-CM | POA: Insufficient documentation

## 2011-12-11 DIAGNOSIS — E119 Type 2 diabetes mellitus without complications: Secondary | ICD-10-CM

## 2011-12-11 DIAGNOSIS — F329 Major depressive disorder, single episode, unspecified: Secondary | ICD-10-CM

## 2011-12-11 DIAGNOSIS — Z0000 Encounter for general adult medical examination without abnormal findings: Secondary | ICD-10-CM

## 2011-12-11 LAB — URINALYSIS, ROUTINE W REFLEX MICROSCOPIC
Leukocytes, UA: NEGATIVE
Specific Gravity, Urine: 1.015 (ref 1.000–1.030)
Urobilinogen, UA: 0.2 (ref 0.0–1.0)

## 2011-12-11 MED ORDER — FLUTICASONE PROPIONATE 50 MCG/ACT NA SUSP: 2.0000 | Freq: Every day | NASAL | Status: DC

## 2011-12-11 MED ORDER — CYANOCOBALAMIN 1000 MCG/ML IJ SOLN
1000.0000 ug | Freq: Once | INTRAMUSCULAR | Status: AC
  Administered 2011-12-11: 1000 ug via INTRAMUSCULAR

## 2011-12-11 MED ORDER — CEFUROXIME AXETIL 250 MG PO TABS: 250.0000 mg | ORAL_TABLET | Freq: Two times a day (BID) | ORAL | Status: AC

## 2011-12-11 NOTE — Patient Instructions (Addendum)
please consider these measures for your health:  minimize alcohol.  do not use tobacco products.  have a colonoscopy at least every 10 years from age 78.  keep firearms safely stored.  always use seat belts.  have working smoke alarms in your home.  see an eye doctor and dentist regularly.  never drive under the influence of alcohol or drugs (including prescription drugs).  those with fair skin should take precautions against the sun. please let me know what your wishes would be, if artificial life support measures should become necessary.  it is critically important to prevent falling down (keep floor areas well-lit, dry, and free of loose objects.  If you have a cane, walker, or wheelchair, you should use it, even for short trips around the house.  Also, try not to rush) blood tests are being requested for you today.  We'll contact you with results. Please return in 1 year. i have sent 2 prescriptions to your pharmacy: antibiotic and nasal spray. You should have a vaccine against shingles (a painful rash which results from the  chickenpox infection which most people had many years ago).  This vaccine reduces, but does not totally eliminate the risk of shingles.  Because this is a medicare part d benefit, you should get it at a pharmacy.   (update: we discussed code status.  pt requests full code, but would not want to be started or maintained on artificial life-support measures if there was not a reasonable chance of recovery)

## 2011-12-11 NOTE — Progress Notes (Signed)
Subjective:    Patient ID: [name], male    DOB: September 13, 1931, 78 y.o.   MRN: [id number]  HPI here for regular wellness examination.  He's feeling pretty well in general, and says chronic med probs are stable, except as noted below Past Medical History  Diagnosis Date  . Coronary artery disease   . Leukopenia   . Smoker     quit 1968--25 ppy  . H/O: asbestos exposure   . Pernicious anemia   . ANEMIA, IRON DEFICIENCY   . COPD (chronic obstructive pulmonary disease)   . BACK PAIN, LUMBAR   . Hypertension   . GERD (gastroesophageal reflux disease)   . DIABETES MELLITUS, TYPE II   . Charcot-Marie-Tooth disease     assoiciated muscular dystrophy     Past Surgical History  Procedure Date  . Coronary artery bypass graft 1995    History   Social History  . Marital Status: Widowed    Spouse Name: N/A    Number of Children: N/A  . Years of Education: N/A   Occupational History  . Retired     Games developer   Social History Main Topics  . Smoking status: Former Smoker -- 1.0 packs/day    Types: Cigarettes    Quit date: 01/01/1966  . Smokeless tobacco: Not on file  . Alcohol Use: No  . Drug Use: No  . Sexually Active: Not on file   Other Topics Concern  . Not on file   Social History Narrative   Was in service during [place] War    Current Outpatient Prescriptions on File Prior to Visit  Medication Sig Dispense Refill  . Ascorbic Acid (VITAMIN C) 500 MG tablet Take 500 mg by mouth 2 (two) times daily.       Marland Kitchen aspirin 325 MG tablet Take 325 mg by mouth daily.        . clotrimazole-betamethasone (LOTRISONE) cream Apply topically 2 (two) times daily as needed.       . cyanocobalamin 1000 MCG tablet Take 500 mcg by mouth 2 (two) times daily.       Marland Kitchen dexlansoprazole (KAPIDEX) 60 MG capsule Take 1 capsule (60 mg total) by mouth daily.  90 capsule  3  . ferrous sulfate (FERRO-BOB) 325 (65 FE) MG tablet Take 325 mg by mouth 2 (two) times daily.       . Multiple  Vitamins-Minerals (MULTIVITAMIN WITH MINERALS) tablet Take 1 tablet by mouth daily.        . niacin 500 MG tablet Take 1 tablet (500 mg total) by mouth daily with breakfast.  90 tablet  3  . nitroGLYCERIN (NITROSTAT) 0.4 MG SL tablet Place 0.4 mg under the tongue every 5 (five) minutes as needed.      . traMADol (ULTRAM) 50 MG tablet Take 1 tablet (50 mg total) by mouth every 6 (six) hours as needed.  100 tablet  3  . Zinc 50 MG CAPS Take 1 capsule by mouth.        . fluticasone (FLONASE) 50 MCG/ACT nasal spray Place 2 sprays into the nose daily.  16 g  6  . metFORMIN (GLUCOPHAGE) 1000 MG tablet Take 1 tablet (1,000 mg total) by mouth 2 (two) times daily with a meal.  360 tablet  3  . montelukast (SINGULAIR) 10 MG tablet Take 1 tablet (10 mg total) by mouth at bedtime.  90 tablet  3  . simvastatin (ZOCOR) 80 MG tablet Take 1 tablet (80 mg total) by  mouth at bedtime.  90 tablet  3    No Known Allergies  Family History  Problem Relation Age of Onset  . Charcot-Marie-Tooth disease Sister   . Charcot-Marie-Tooth disease Brother   . Charcot-Marie-Tooth disease Brother   . Charcot-Marie-Tooth disease Brother   . Charcot-Marie-Tooth disease Sister   . Charcot-Marie-Tooth disease Son   . Charcot-Marie-Tooth disease Other     BP 126/80  Pulse 63  Temp 97.8 F (36.6 C) (Oral)  Wt 184 lb (83.462 kg)   Review of Systems  Constitutional: Negative for fever.       He has lost a few lbs, due to his efforts  HENT: Negative for hearing loss.   Eyes: Negative for visual disturbance.  Respiratory: Negative for shortness of breath.   Cardiovascular: Negative for chest pain.       No change in chronic doe  Gastrointestinal: Negative for anal bleeding.  Genitourinary: Negative for difficulty urinating.  Musculoskeletal: Negative for back pain.  Skin: Negative for rash.  Neurological: Negative for syncope.  Hematological: Does not bruise/bleed easily.  Psychiatric/Behavioral:       No change  in chronic mild depression      Objective:   Physical Exam NECK: supple, thyroid is not enlarged CHEST WALL: no deformity.  Old healed median sternotomy surgical scar LUNGS: clear to auscultation, but decreased bs at the bases BREASTS:  No gynecomastia CV: reg rate and rhythm, no murmur ABD: abdomen is soft, nontender.  no hepatosplenomegaly.  not distended.  no hernia GENITALIA:  Normal male.   RECTAL: normal external and internal exam.  heme neg. PROSTATE:  Normal size.  No nodule MUSCULOSKELETAL: muscle bulk and strength are grossly normal.  no obvious joint swelling.  gait is normal and steady EXTEMITIES: sees podiatry PULSES: dorsalis pedis intact bilat.  no carotid bruit NEURO:  cn 2-12 grossly intact.   readily moves all 4's.  sensation is intact to touch on the feet SKIN:  Normal texture and temperature.  No rash or suspicious lesion is visible.  Several seborrheic keratoses. NODES:  None palpable at the neck. PSYCH: alert, oriented x3.  Does not appear anxious nor depressed.        Assessment & Plan:  Wellness visit today, with problems stable, except as noted.    SEPARATE EVALUATION FOLLOWS--EACH PROBLEM HERE IS NEW, NOT RESPONDING TO TREATMENT, OR POSES SIGNIFICANT RISK TO THE PATIENT'S HEALTH: HISTORY OF THE PRESENT ILLNESS: Pt states 1 week of moderate congestion in the nose, but no assoc otalgia He has had colonoscopy, but he has declined hemoccults PAST MEDICAL HISTORY reviewed and up to date today REVIEW OF SYSTEMS: He denies hematuria, but he reports fatigue. PHYSICAL EXAMINATION: VITAL SIGNS:  See vs page head: no deformity eyes: no periorbital swelling, no proptosis external nose and ears are normal mouth: no lesion seen Both eac's and tm's are normal GENERAL: no distress LAB/XRAY RESULTS: Lab Results  Component Value Date   WBC 7.2 12/11/2011   HGB 12.1* 12/11/2011   HCT 36.4* 12/11/2011   PLT 182.0 12/11/2011   GLUCOSE 76 12/11/2011   CHOL  131 12/11/2011   TRIG 100.0 12/11/2011   HDL 49.80 12/11/2011   LDLCALC 61 12/11/2011   ALT 17 12/11/2011   AST 24 12/11/2011   NA 141 12/11/2011   K 5.0 12/11/2011   CL 103 12/11/2011   CREATININE 0.6 12/11/2011   BUN 20 12/11/2011   CO2 27 12/11/2011   TSH 1.98 12/11/2011   PSA 1.42 12/11/2011  HGBA1C 6.5 12/11/2011   MICROALBUR 3.1* 12/11/2011  IMPRESSION: URI, new Anemia, recurrent Easy bruising.  This is very unlikely related to the anemia. PLAN: See instruction page

## 2011-12-12 LAB — CBC WITH DIFFERENTIAL/PLATELET
Basophils Relative: 0.2 % (ref 0.0–3.0)
Eosinophils Absolute: 0.2 10*3/uL (ref 0.0–0.7)
Hemoglobin: 12.1 g/dL — ABNORMAL LOW (ref 13.0–17.0)
Lymphocytes Relative: 14.9 % (ref 12.0–46.0)
MCHC: 33.3 g/dL (ref 30.0–36.0)
Monocytes Relative: 7.4 % (ref 3.0–12.0)
Neutro Abs: 5.3 10*3/uL (ref 1.4–7.7)
Neutrophils Relative %: 74.6 % (ref 43.0–77.0)
RBC: 4.02 Mil/uL — ABNORMAL LOW (ref 4.22–5.81)
WBC: 7.2 10*3/uL (ref 4.5–10.5)

## 2011-12-12 LAB — LIPID PANEL
Cholesterol: 131 mg/dL (ref 0–200)
LDL Cholesterol: 61 mg/dL (ref 0–99)
Total CHOL/HDL Ratio: 3

## 2011-12-12 LAB — HEPATIC FUNCTION PANEL
AST: 24 U/L (ref 0–37)
Albumin: 4.6 g/dL (ref 3.5–5.2)
Alkaline Phosphatase: 44 U/L (ref 39–117)
Bilirubin, Direct: 0 mg/dL (ref 0.0–0.3)
Total Protein: 6.8 g/dL (ref 6.0–8.3)

## 2011-12-12 LAB — MICROALBUMIN / CREATININE URINE RATIO: Microalb Creat Ratio: 4.8 mg/g (ref 0.0–30.0)

## 2011-12-12 LAB — BASIC METABOLIC PANEL
BUN: 20 mg/dL (ref 6–23)
CO2: 27 mEq/L (ref 19–32)
Calcium: 9.4 mg/dL (ref 8.4–10.5)
Creatinine, Ser: 0.6 mg/dL (ref 0.4–1.5)
Glucose, Bld: 76 mg/dL (ref 70–99)

## 2011-12-12 LAB — HEMOGLOBIN A1C: Hgb A1c MFr Bld: 6.5 % (ref 4.6–6.5)

## 2011-12-12 MED ORDER — SIMVASTATIN 80 MG PO TABS: 80.0000 mg | ORAL_TABLET | Freq: Every day | ORAL | Status: DC

## 2011-12-12 MED ORDER — MONTELUKAST SODIUM 10 MG PO TABS: 10.0000 mg | ORAL_TABLET | Freq: Every day | ORAL | Status: DC

## 2011-12-12 MED ORDER — SERTRALINE HCL 50 MG PO TABS: 50.0000 mg | ORAL_TABLET | Freq: Every day | ORAL | Status: DC

## 2011-12-12 MED ORDER — NIACIN 500 MG PO TABS: 500.0000 mg | ORAL_TABLET | Freq: Every day | ORAL | Status: DC

## 2011-12-12 MED ORDER — METFORMIN HCL 1000 MG PO TABS: 1000.0000 mg | ORAL_TABLET | Freq: Two times a day (BID) | ORAL | Status: DC

## 2011-12-12 MED ORDER — TRAMADOL HCL 50 MG PO TABS: 50.0000 mg | ORAL_TABLET | Freq: Four times a day (QID) | ORAL | Status: DC | PRN

## 2011-12-13 ENCOUNTER — Telehealth: Payer: Self-pay | Admitting: *Deleted

## 2011-12-13 MED ORDER — METFORMIN HCL 1000 MG PO TABS: 1000.0000 mg | ORAL_TABLET | Freq: Two times a day (BID) | ORAL | Status: DC

## 2011-12-13 MED ORDER — SIMVASTATIN 80 MG PO TABS: 80.0000 mg | ORAL_TABLET | Freq: Every day | ORAL | Status: DC

## 2011-12-13 MED ORDER — MONTELUKAST SODIUM 10 MG PO TABS: 10.0000 mg | ORAL_TABLET | Freq: Every day | ORAL | Status: DC

## 2011-12-13 NOTE — Telephone Encounter (Signed)
Patient notified of result of lab of being anemic. Instructed to be sure to take his iron pill every day. Patient states Dr. Everardo All told him to take 2 tablets of iron pill every day year ago. Explained to patient of foods and diet that would help with low iron level. Medication refill of metformin , Singulair,  simvastatin sent  To [facility].

## 2011-12-13 NOTE — Telephone Encounter (Signed)
Message copied by Elnora Morrison on Thu Dec 13, 2011  8:57 AM ------      Message from: Romero Belling      Created: Wed Dec 12, 2011  4:39 PM       please call patient:      You are slightly anemic      Please take iron, 1 pill per day

## 2012-01-10 ENCOUNTER — Telehealth: Payer: Self-pay | Admitting: Endocrinology

## 2012-01-10 NOTE — Telephone Encounter (Signed)
Last o/v faxed  

## 2012-01-10 NOTE — Telephone Encounter (Signed)
[name] with [facility] called to state that he received the diabetic order but no office notes.  The direct fax is [phone].

## 2012-01-21 ENCOUNTER — Telehealth: Payer: Self-pay

## 2012-01-21 NOTE — Telephone Encounter (Signed)
Pt's daughter calling for pt.  He is c/o right heel pain and would like an order for an xray/us to see if he has a bone spur.

## 2012-01-22 NOTE — Telephone Encounter (Signed)
Please advise ov 

## 2012-01-22 NOTE — Telephone Encounter (Signed)
Patient scheduled for 3:30pm on 01/23/12.

## 2012-01-23 ENCOUNTER — Ambulatory Visit
Admission: RE | Admit: 2012-01-23 | Discharge: 2012-01-23 | Disposition: A | Discharge status: Home / Self Care | Payer: Medicare Other | Source: Ambulatory Visit | Attending: Endocrinology | Admitting: Endocrinology

## 2012-01-23 ENCOUNTER — Ambulatory Visit (INDEPENDENT_AMBULATORY_CARE_PROVIDER_SITE_OTHER): Payer: Medicare Other | Admitting: Endocrinology

## 2012-01-23 VITALS — BP 134/68 | HR 68 | Wt 189.0 lb

## 2012-01-23 DIAGNOSIS — M25571 Pain in right ankle and joints of right foot: Secondary | ICD-10-CM

## 2012-01-23 DIAGNOSIS — D509 Iron deficiency anemia, unspecified: Secondary | ICD-10-CM

## 2012-01-23 DIAGNOSIS — M25579 Pain in unspecified ankle and joints of unspecified foot: Secondary | ICD-10-CM

## 2012-01-23 DIAGNOSIS — D510 Vitamin B12 deficiency anemia due to intrinsic factor deficiency: Secondary | ICD-10-CM

## 2012-01-23 MED ORDER — CYANOCOBALAMIN 1000 MCG/ML IJ SOLN: 1000.0000 ug | Freq: Once | INTRAMUSCULAR | Status: DC

## 2012-01-23 NOTE — Progress Notes (Signed)
Subjective:    Patient ID: [name], male    DOB: 04/24/1931, 79 y.o.   MRN: [id number]  HPI Pt states few mos of moderate pain at the left heel and ankle.  He does not recall any injury, but he has chronic numbness of the feet.  No assoc falls.  Past Medical History  Diagnosis Date  . Coronary artery disease   . Leukopenia   . Smoker     quit 1968--25 ppy  . H/O: asbestos exposure   . Pernicious anemia   . ANEMIA, IRON DEFICIENCY   . COPD (chronic obstructive pulmonary disease)   . BACK PAIN, LUMBAR   . Hypertension   . GERD (gastroesophageal reflux disease)   . DIABETES MELLITUS, TYPE II   . Charcot-Marie-Tooth disease     assoiciated muscular dystrophy     Past Surgical History  Procedure Date  . Coronary artery bypass graft 1995    History   Social History  . Marital Status: Widowed    Spouse Name: N/A    Number of Children: N/A  . Years of Education: N/A   Occupational History  . Retired     Games developer   Social History Main Topics  . Smoking status: Former Smoker -- 1.0 packs/day    Types: Cigarettes    Quit date: 01/01/1966  . Smokeless tobacco: Not on file  . Alcohol Use: No  . Drug Use: No  . Sexually Active: Not on file   Other Topics Concern  . Not on file   Social History Narrative   Was in service during [place] War    Current Outpatient Prescriptions on File Prior to Visit  Medication Sig Dispense Refill  . Ascorbic Acid (VITAMIN C) 500 MG tablet Take 500 mg by mouth 2 (two) times daily.       Marland Kitchen aspirin 325 MG tablet Take 325 mg by mouth daily.        . clotrimazole-betamethasone (LOTRISONE) cream Apply topically 2 (two) times daily as needed.       . cyanocobalamin 1000 MCG tablet Take 500 mcg by mouth 2 (two) times daily.       Marland Kitchen dexlansoprazole (KAPIDEX) 60 MG capsule Take 1 capsule (60 mg total) by mouth daily.  90 capsule  3  . ferrous sulfate (FERRO-BOB) 325 (65 FE) MG tablet Take 325 mg by mouth 2 (two) times daily.         . fluticasone (FLONASE) 50 MCG/ACT nasal spray Place 2 sprays into the nose daily.  16 g  6  . metFORMIN (GLUCOPHAGE) 1000 MG tablet Take 1 tablet (1,000 mg total) by mouth 2 (two) times daily with a meal.  360 tablet  3  . montelukast (SINGULAIR) 10 MG tablet Take 1 tablet (10 mg total) by mouth at bedtime.  90 tablet  3  . Multiple Vitamins-Minerals (MULTIVITAMIN WITH MINERALS) tablet Take 1 tablet by mouth daily.        . niacin 500 MG tablet Take 1 tablet (500 mg total) by mouth daily with breakfast.  90 tablet  3  . nitroGLYCERIN (NITROSTAT) 0.4 MG SL tablet Place 0.4 mg under the tongue every 5 (five) minutes as needed.      . sertraline (ZOLOFT) 50 MG tablet Take 1 tablet (50 mg total) by mouth daily.  90 tablet  3  . simvastatin (ZOCOR) 80 MG tablet Take 1 tablet (80 mg total) by mouth at bedtime.  90 tablet  3  . traMADol (  ULTRAM) 50 MG tablet Take 1 tablet (50 mg total) by mouth every 6 (six) hours as needed.  100 tablet  3  . Zinc 50 MG CAPS Take 1 capsule by mouth.         No current facility-administered medications on file prior to visit.    No Known Allergies  Family History  Problem Relation Age of Onset  . Charcot-Marie-Tooth disease Sister   . Charcot-Marie-Tooth disease Brother   . Charcot-Marie-Tooth disease Brother   . Charcot-Marie-Tooth disease Brother   . Charcot-Marie-Tooth disease Sister   . Charcot-Marie-Tooth disease Son   . Charcot-Marie-Tooth disease Other     BP 134/68  Pulse 68  Wt 189 lb (85.73 kg)  SpO2 93%  Review of Systems Denies edema    Objective:   Physical Exam VITAL SIGNS:  See vs page GENERAL: no distress  Right foot: The foot is chronically slightly inverted.  no ulcer.  normal color and temp.  no edema.  onychomycosis of toenails.  Old healed surgical scar (vein harvest) on the right leg. dorsalis pedis intact sensation is intact to touch, but decreased from normal    Assessment & Plan:  Ankle pain, new, uncertain etiology

## 2012-01-23 NOTE — Patient Instructions (Addendum)
X-rays are requested for you today.  We'll contact you with results.  I hope you feel better soon.  If you don't feel better by next week, please call back.

## 2012-04-03 ENCOUNTER — Ambulatory Visit
Admission: RE | Admit: 2012-04-03 | Discharge: 2012-04-03 | Disposition: A | Discharge status: Home / Self Care | Payer: Medicare Other | Source: Ambulatory Visit | Attending: Endocrinology | Admitting: Endocrinology

## 2012-04-03 ENCOUNTER — Other Ambulatory Visit: Payer: Self-pay

## 2012-04-03 ENCOUNTER — Other Ambulatory Visit: Payer: Self-pay | Admitting: Endocrinology

## 2012-04-03 ENCOUNTER — Ambulatory Visit (INDEPENDENT_AMBULATORY_CARE_PROVIDER_SITE_OTHER): Payer: Medicare Other | Admitting: Endocrinology

## 2012-04-03 ENCOUNTER — Encounter: Payer: Self-pay | Admitting: Endocrinology

## 2012-04-03 VITALS — BP 130/72 | HR 74 | Wt 193.0 lb

## 2012-04-03 DIAGNOSIS — M545 Low back pain: Secondary | ICD-10-CM

## 2012-04-03 LAB — URINALYSIS, ROUTINE W REFLEX MICROSCOPIC
Bilirubin Urine: NEGATIVE
Hgb urine dipstick: NEGATIVE
Nitrite: NEGATIVE
Total Protein, Urine: NEGATIVE
Urine Glucose: NEGATIVE
pH: 6 (ref 5.0–8.0)

## 2012-04-03 MED ORDER — HYDROCODONE-ACETAMINOPHEN 10-325 MG PO TABS: 1.0000 | ORAL_TABLET | Freq: Four times a day (QID) | ORAL | Status: DC | PRN

## 2012-04-03 NOTE — Progress Notes (Signed)
Subjective:    Patient ID: [name], male    DOB: March 01, 1931, 79 y.o.   MRN: [id number]  HPI Pt states 1 month of moderate pain rad from the left sacral area, to the left inguinal area.  No assoc numbness. No injury.  No rash.   Past Medical History  Diagnosis Date  . Coronary artery disease   . Leukopenia   . Smoker     quit 1968--25 ppy  . H/O: asbestos exposure   . Pernicious anemia   . ANEMIA, IRON DEFICIENCY   . COPD (chronic obstructive pulmonary disease)   . BACK PAIN, LUMBAR   . Hypertension   . GERD (gastroesophageal reflux disease)   . DIABETES MELLITUS, TYPE II   . Charcot-Marie-Tooth disease     assoiciated muscular dystrophy     Past Surgical History  Procedure Laterality Date  . Coronary artery bypass graft  1995    History   Social History  . Marital Status: Widowed    Spouse Name: N/A    Number of Children: N/A  . Years of Education: N/A   Occupational History  . Retired     Games developer   Social History Main Topics  . Smoking status: Former Smoker -- 1.00 packs/day    Types: Cigarettes    Quit date: 01/01/1966  . Smokeless tobacco: Not on file  . Alcohol Use: No  . Drug Use: No  . Sexually Active: Not on file   Other Topics Concern  . Not on file   Social History Narrative   Was in service during [place] War          Current Outpatient Prescriptions on File Prior to Visit  Medication Sig Dispense Refill  . Ascorbic Acid (VITAMIN C) 500 MG tablet Take 500 mg by mouth 2 (two) times daily.       Marland Kitchen aspirin 325 MG tablet Take 325 mg by mouth daily.        . clotrimazole-betamethasone (LOTRISONE) cream Apply topically 2 (two) times daily as needed.       . cyanocobalamin 1000 MCG tablet Take 500 mcg by mouth 2 (two) times daily.       Marland Kitchen dexlansoprazole (KAPIDEX) 60 MG capsule Take 1 capsule (60 mg total) by mouth daily.  90 capsule  3  . ferrous sulfate (FERRO-BOB) 325 (65 FE) MG tablet Take 325 mg by mouth 2 (two) times daily.        . fluticasone (FLONASE) 50 MCG/ACT nasal spray Place 2 sprays into the nose daily.  16 g  6  . metFORMIN (GLUCOPHAGE) 1000 MG tablet Take 1 tablet (1,000 mg total) by mouth 2 (two) times daily with a meal.  360 tablet  3  . montelukast (SINGULAIR) 10 MG tablet Take 1 tablet (10 mg total) by mouth at bedtime.  90 tablet  3  . Multiple Vitamins-Minerals (MULTIVITAMIN WITH MINERALS) tablet Take 1 tablet by mouth daily.        . niacin 500 MG tablet Take 1 tablet (500 mg total) by mouth daily with breakfast.  90 tablet  3  . nitroGLYCERIN (NITROSTAT) 0.4 MG SL tablet Place 0.4 mg under the tongue every 5 (five) minutes as needed.      . sertraline (ZOLOFT) 50 MG tablet Take 1 tablet (50 mg total) by mouth daily.  90 tablet  3  . simvastatin (ZOCOR) 80 MG tablet Take 1 tablet (80 mg total) by mouth at bedtime.  90 tablet  3  .  Zinc 50 MG CAPS Take 1 capsule by mouth.         Current Facility-Administered Medications on File Prior to Visit  Medication Dose Route Frequency Provider Last Rate Last Dose  . cyanocobalamin ((VITAMIN B-12)) injection 1,000 mcg  1,000 mcg Intramuscular Once Romero Belling, MD        No Known Allergies  Family History  Problem Relation Age of Onset  . Charcot-Marie-Tooth disease Sister   . Charcot-Marie-Tooth disease Brother   . Charcot-Marie-Tooth disease Brother   . Charcot-Marie-Tooth disease Brother   . Charcot-Marie-Tooth disease Sister   . Charcot-Marie-Tooth disease Son   . Charcot-Marie-Tooth disease Other    BP 130/72  Pulse 74  Wt 193 lb (87.544 kg)  BMI 28.49 kg/m2  SpO2 97%  Review of Systems Denies hematuria and dysuria.      Objective:   Physical Exam VITAL SIGNS:  See vs page GENERAL: no distress Sacral area: nontender Gait: steady with a cane. Neuro: sensation is intact to touch on the LE's  (i reviewed x-ray results)    Assessment & Plan:  Low-back pain, new, prob due to OA.

## 2012-04-03 NOTE — Patient Instructions (Addendum)
A urine test and x-rays are being requested for you today.  We'll contact you with results.   Here is a prescription for a pain pill. I hope you feel better soon.  If you don't feel better by next week, please call back.

## 2012-05-09 ENCOUNTER — Ambulatory Visit (INDEPENDENT_AMBULATORY_CARE_PROVIDER_SITE_OTHER): Payer: Medicare Other | Admitting: Ophthalmology

## 2012-05-09 DIAGNOSIS — H35039 Hypertensive retinopathy, unspecified eye: Secondary | ICD-10-CM

## 2012-05-09 DIAGNOSIS — E1139 Type 2 diabetes mellitus with other diabetic ophthalmic complication: Secondary | ICD-10-CM

## 2012-05-09 DIAGNOSIS — I10 Essential (primary) hypertension: Secondary | ICD-10-CM

## 2012-05-09 DIAGNOSIS — E11319 Type 2 diabetes mellitus with unspecified diabetic retinopathy without macular edema: Secondary | ICD-10-CM

## 2012-05-09 DIAGNOSIS — H43819 Vitreous degeneration, unspecified eye: Secondary | ICD-10-CM

## 2012-09-26 ENCOUNTER — Other Ambulatory Visit: Payer: Self-pay | Admitting: Endocrinology

## 2012-09-26 MED ORDER — TRAMADOL HCL 50 MG PO TABS: 50.0000 mg | ORAL_TABLET | Freq: Four times a day (QID) | ORAL | Status: DC | PRN

## 2012-11-13 ENCOUNTER — Ambulatory Visit (INDEPENDENT_AMBULATORY_CARE_PROVIDER_SITE_OTHER): Payer: Medicare Other

## 2012-11-13 VITALS — BP 126/77 | HR 61 | Resp 18

## 2012-11-13 DIAGNOSIS — M79609 Pain in unspecified limb: Secondary | ICD-10-CM

## 2012-11-13 DIAGNOSIS — E1149 Type 2 diabetes mellitus with other diabetic neurological complication: Secondary | ICD-10-CM

## 2012-11-13 DIAGNOSIS — B351 Tinea unguium: Secondary | ICD-10-CM

## 2012-11-13 DIAGNOSIS — E1142 Type 2 diabetes mellitus with diabetic polyneuropathy: Secondary | ICD-10-CM

## 2012-11-13 DIAGNOSIS — Q828 Other specified congenital malformations of skin: Secondary | ICD-10-CM

## 2012-11-13 DIAGNOSIS — E1140 Type 2 diabetes mellitus with diabetic neuropathy, unspecified: Secondary | ICD-10-CM

## 2012-11-13 NOTE — Progress Notes (Signed)
  Subjective:    Patient ID: [name], male    DOB: 08/03/1931, 79 y.o.   MRN: [id number]  HPI trim my nails and trim my calluses    Review of Systems  Constitutional: Negative.   HENT: Positive for sinus pressure.   Eyes: Negative.   Respiratory: Positive for shortness of breath.   Cardiovascular: Positive for chest pain.  Gastrointestinal: Negative.   Endocrine: Positive for cold intolerance and heat intolerance.  Genitourinary: Negative.   Musculoskeletal: Negative.   Skin: Negative.   Allergic/Immunologic: Negative.   Neurological: Negative.   Hematological: Negative.   Psychiatric/Behavioral: Negative.        Objective:   Physical Exam Neurovascular status as follows pedal pulses palpable DP plus one over 4 bilateral PT pulse one over 4 bilateral. Capillary fill time 4 seconds all digits. Skin temperature warm turgor diminished no edema rubor pallor noted moderate varicosities noted bilateral. No active ulcer noted there is keratoses with hemorrhage a keratotic skin sub-first left no active ulcer noted although history of ulcers present. Neurologically epicritic and proprioceptive sensations diminished on Semmes Weinstein testing to forefoot digits and plantar arch. Orthopedic biomechanical exam reveals cavovarus foot type abnormality gait with Charcot-Marie-Tooth deformities and abnormality. Semirigid digital contractures noted. Nails thick brittle friable criptotic with incurvation and tenderness and discoloration.       Assessment & Plan:  Diabetes with peripheral neuropathy abnormality gait. With onychomycosis and kyphosis of nails. Keratotic lesions secondary to plantar flexed metatarsals and digital contractures. No other new changes no active ulcer at this time. Nails debrided x10 the presence of diabetes and onychomycosis still keratotic lesion also debridement this time return for followup in 3 months for palliative care. Suggest continue topical antifungal  treatment such as formula 3 over-the-counter Fungi-Nail to the affected nails daily for 12 month duration. Recheck in 3 months   Alvan Dame DPM

## 2012-11-13 NOTE — Patient Instructions (Signed)
Diabetes and Foot Care Diabetes may cause you to have problems because of poor blood supply (circulation) to your feet and legs. This may cause the skin on your feet to become thinner, break easier, and heal more slowly. Your skin may become dry, and the skin may peel and crack. You may also have nerve damage in your legs and feet causing decreased feeling in them. You may not notice minor injuries to your feet that could lead to infections or more serious problems. Taking care of your feet is one of the most important things you can do for yourself.  HOME CARE INSTRUCTIONS  Wear shoes at all times, even in the house. Do not go barefoot. Bare feet are easily injured.  Check your feet daily for blisters, cuts, and redness. If you cannot see the bottom of your feet, use a mirror or ask someone for help.  Wash your feet with warm water (do not use hot water) and mild soap. Then pat your feet and the areas between your toes until they are completely dry. Do not soak your feet as this can dry your skin.  Apply a moisturizing lotion or petroleum jelly (that does not contain alcohol and is unscented) to the skin on your feet and to dry, brittle toenails. Do not apply lotion between your toes.  Trim your toenails straight across. Do not dig under them or around the cuticle. File the edges of your nails with an emery board or nail file.  Do not cut corns or calluses or try to remove them with medicine.  Wear clean socks or stockings every day. Make sure they are not too tight. Do not wear knee-high stockings since they may decrease blood flow to your legs.  Wear shoes that fit properly and have enough cushioning. To break in new shoes, wear them for just a few hours a day. This prevents you from injuring your feet. Always look in your shoes before you put them on to be sure there are no objects inside.  Do not cross your legs. This may decrease the blood flow to your feet.  If you find a minor scrape,  cut, or break in the skin on your feet, keep it and the skin around it clean and dry. These areas may be cleansed with mild soap and water. Do not cleanse the area with peroxide, alcohol, or iodine.  When you remove an adhesive bandage, be sure not to damage the skin around it.  If you have a wound, look at it several times a day to make sure it is healing.  Do not use heating pads or hot water bottles. They may burn your skin. If you have lost feeling in your feet or legs, you may not know it is happening until it is too late.  Make sure your health care provider performs a complete foot exam at least annually or more often if you have foot problems. Report any cuts, sores, or bruises to your health care provider immediately. SEEK MEDICAL CARE IF:   You have an injury that is not healing.  You have cuts or breaks in the skin.  You have an ingrown nail.  You notice redness on your legs or feet.  You feel burning or tingling in your legs or feet.  You have pain or cramps in your legs and feet.  Your legs or feet are numb.  Your feet always feel cold. SEEK IMMEDIATE MEDICAL CARE IF:   There is increasing redness,   swelling, or pain in or around a wound.  There is a red line that goes up your leg.  Pus is coming from a wound.  You develop a fever or as directed by your health care provider.  You notice a bad smell coming from an ulcer or wound. Document Released: 12/16/1999 Document Revised: 08/20/2012 Document Reviewed: 05/27/2012 [employer] Patient Information 2014 [employer].  

## 2012-12-15 ENCOUNTER — Ambulatory Visit (INDEPENDENT_AMBULATORY_CARE_PROVIDER_SITE_OTHER): Payer: Medicare Other | Admitting: Endocrinology

## 2012-12-15 ENCOUNTER — Encounter: Payer: Self-pay | Admitting: Endocrinology

## 2012-12-15 VITALS — BP 112/60 | HR 65 | Temp 98.0°F | Ht 69.0 in | Wt 181.0 lb

## 2012-12-15 DIAGNOSIS — I10 Essential (primary) hypertension: Secondary | ICD-10-CM

## 2012-12-15 DIAGNOSIS — E119 Type 2 diabetes mellitus without complications: Secondary | ICD-10-CM

## 2012-12-15 DIAGNOSIS — Z0000 Encounter for general adult medical examination without abnormal findings: Secondary | ICD-10-CM

## 2012-12-15 DIAGNOSIS — Z125 Encounter for screening for malignant neoplasm of prostate: Secondary | ICD-10-CM

## 2012-12-15 DIAGNOSIS — D509 Iron deficiency anemia, unspecified: Secondary | ICD-10-CM

## 2012-12-15 DIAGNOSIS — E1149 Type 2 diabetes mellitus with other diabetic neurological complication: Secondary | ICD-10-CM

## 2012-12-15 DIAGNOSIS — Z79899 Other long term (current) drug therapy: Secondary | ICD-10-CM

## 2012-12-15 DIAGNOSIS — I2510 Atherosclerotic heart disease of native coronary artery without angina pectoris: Secondary | ICD-10-CM

## 2012-12-15 DIAGNOSIS — Z23 Encounter for immunization: Secondary | ICD-10-CM

## 2012-12-15 LAB — MICROALBUMIN / CREATININE URINE RATIO
Creatinine,U: 76.5 mg/dL
Microalb Creat Ratio: 1.3 mg/g (ref 0.0–30.0)
Microalb, Ur: 1 mg/dL (ref 0.0–1.9)

## 2012-12-15 LAB — BASIC METABOLIC PANEL
BUN: 19 mg/dL (ref 6–23)
Calcium: 9 mg/dL (ref 8.4–10.5)
Creatinine, Ser: 0.6 mg/dL (ref 0.4–1.5)
GFR: 143.62 mL/min (ref >60.00)
Glucose, Bld: 97 mg/dL (ref 70–99)
Potassium: 4.2 mEq/L (ref 3.5–5.1)

## 2012-12-15 LAB — HEPATIC FUNCTION PANEL
ALT: 16 U/L (ref 0–53)
AST: 21 U/L (ref 0–37)
Albumin: 4.4 g/dL (ref 3.5–5.2)
Alkaline Phosphatase: 36 U/L — ABNORMAL LOW (ref 39–117)
Bilirubin, Direct: 0.1 mg/dL (ref 0.0–0.3)
Total Protein: 6.2 g/dL (ref 6.0–8.3)

## 2012-12-15 LAB — LIPID PANEL
HDL: 51.7 mg/dL (ref >39.00)
LDL Cholesterol: 71 mg/dL (ref 0–99)
Total CHOL/HDL Ratio: 3
VLDL: 14.8 mg/dL (ref 0.0–40.0)

## 2012-12-15 LAB — CBC WITH DIFFERENTIAL/PLATELET
Basophils Absolute: 0 10*3/uL (ref 0.0–0.1)
Basophils Relative: 0.4 % (ref 0.0–3.0)
Eosinophils Absolute: 0.3 10*3/uL (ref 0.0–0.7)
Hemoglobin: 11.6 g/dL — ABNORMAL LOW (ref 13.0–17.0)
MCHC: 33.5 g/dL (ref 30.0–36.0)
MCV: 90.8 fl (ref 78.0–100.0)
Monocytes Absolute: 0.6 10*3/uL (ref 0.1–1.0)
Neutro Abs: 2.6 10*3/uL (ref 1.4–7.7)
Neutrophils Relative %: 59.5 % (ref 43.0–77.0)
RBC: 3.81 Mil/uL — ABNORMAL LOW (ref 4.22–5.81)
RDW: 13.9 % (ref 11.5–14.6)

## 2012-12-15 LAB — URINALYSIS, ROUTINE W REFLEX MICROSCOPIC
Hgb urine dipstick: NEGATIVE
Ketones, ur: NEGATIVE
Leukocytes, UA: NEGATIVE
RBC / HPF: NONE SEEN (ref 0–?)
Total Protein, Urine: NEGATIVE
Urine Glucose: NEGATIVE
Urobilinogen, UA: 0.2 (ref 0.0–1.0)

## 2012-12-15 LAB — IBC PANEL: Transferrin: 224.5 mg/dL (ref 212.0–360.0)

## 2012-12-15 LAB — HEMOGLOBIN A1C: Hgb A1c MFr Bld: 6.3 % (ref 4.6–6.5)

## 2012-12-15 MED ORDER — CYANOCOBALAMIN 1000 MCG/ML IJ SOLN
1000.0000 ug | Freq: Once | INTRAMUSCULAR | Status: AC
  Administered 2012-12-15: 1000 ug via INTRAMUSCULAR

## 2012-12-15 NOTE — Patient Instructions (Addendum)
blood tests are being requested for you today.  We'll contact you with results. please consider these measures for your health:  minimize alcohol.  do not use tobacco products.  have a colonoscopy at least every 10 years from age 79.  keep firearms safely stored.  always use seat belts.  have working smoke alarms in your home.  see an eye doctor and dentist regularly.  never drive under the influence of alcohol or drugs (including prescription drugs).  those with fair skin should take precautions against the sun. Please come back for a follow-up appointment in 6 months.   Refer back to dr Clifton James.  you will receive a phone call, about a day and time for an appointment.

## 2012-12-15 NOTE — Progress Notes (Signed)
Subjective:    Patient ID: [name], male    DOB: 1931-04-23, 79 y.o.   MRN: [id number]  HPI Pt is here for regular wellness examination, and is feeling pretty well in general, and says chronic med probs are stable, except as noted below.  He declines f/u colonoscopy. Past Medical History  Diagnosis Date  . Coronary artery disease   . Leukopenia   . Smoker     quit 1968--25 ppy  . H/O: asbestos exposure   . Pernicious anemia   . ANEMIA, IRON DEFICIENCY   . COPD (chronic obstructive pulmonary disease)   . BACK PAIN, LUMBAR   . Hypertension   . GERD (gastroesophageal reflux disease)   . DIABETES MELLITUS, TYPE II   . Charcot-Marie-Tooth disease     assoiciated muscular dystrophy     Past Surgical History  Procedure Laterality Date  . Coronary artery bypass graft  1995    History   Social History  . Marital Status: Widowed    Spouse Name: N/A    Number of Children: N/A  . Years of Education: N/A   Occupational History  . Retired     Games developer   Social History Main Topics  . Smoking status: Former Smoker -- 1.00 packs/day    Types: Cigarettes    Quit date: 01/01/1966  . Smokeless tobacco: Never Used  . Alcohol Use: No  . Drug Use: No  . Sexual Activity: Not on file   Other Topics Concern  . Not on file   Social History Narrative   Was in service during [place] War          Current Outpatient Prescriptions on File Prior to Visit  Medication Sig Dispense Refill  . Ascorbic Acid (VITAMIN C) 500 MG tablet Take 500 mg by mouth 2 (two) times daily.       Marland Kitchen aspirin 325 MG tablet Take 325 mg by mouth daily.        . clotrimazole-betamethasone (LOTRISONE) cream Apply topically 2 (two) times daily as needed.       . cyanocobalamin 1000 MCG tablet Take 500 mcg by mouth 2 (two) times daily.       Marland Kitchen dexlansoprazole (KAPIDEX) 60 MG capsule Take 1 capsule (60 mg total) by mouth daily.  90 capsule  3  . ferrous sulfate (FERRO-BOB) 325 (65 FE) MG tablet Take  325 mg by mouth 2 (two) times daily.       . fluticasone (FLONASE) 50 MCG/ACT nasal spray Place 2 sprays into the nose daily.  16 g  6  . HYDROcodone-acetaminophen (NORCO) 10-325 MG per tablet Take 1 tablet by mouth every 6 (six) hours as needed for pain.  30 tablet  0  . metFORMIN (GLUCOPHAGE) 1000 MG tablet Take 1 tablet (1,000 mg total) by mouth 2 (two) times daily with a meal.  360 tablet  3  . montelukast (SINGULAIR) 10 MG tablet Take 1 tablet (10 mg total) by mouth at bedtime.  90 tablet  3  . Multiple Vitamins-Minerals (MULTIVITAMIN WITH MINERALS) tablet Take 1 tablet by mouth daily.        . niacin 500 MG tablet Take 1 tablet (500 mg total) by mouth daily with breakfast.  90 tablet  3  . nitroGLYCERIN (NITROSTAT) 0.4 MG SL tablet Place 0.4 mg under the tongue every 5 (five) minutes as needed.      . sertraline (ZOLOFT) 50 MG tablet Take 1 tablet (50 mg total) by mouth daily.  90 tablet  3  . simvastatin (ZOCOR) 80 MG tablet Take 1 tablet (80 mg total) by mouth at bedtime.  90 tablet  3  . traMADol (ULTRAM) 50 MG tablet Take 1 tablet (50 mg total) by mouth every 6 (six) hours as needed.  100 tablet  3  . Zinc 50 MG CAPS Take 1 capsule by mouth.         Current Facility-Administered Medications on File Prior to Visit  Medication Dose Route Frequency Provider Last Rate Last Dose  . cyanocobalamin ((VITAMIN B-12)) injection 1,000 mcg  1,000 mcg Intramuscular Once Romero Belling, MD        No Known Allergies  Family History  Problem Relation Age of Onset  . Charcot-Marie-Tooth disease Sister   . Charcot-Marie-Tooth disease Brother   . Charcot-Marie-Tooth disease Brother   . Charcot-Marie-Tooth disease Brother   . Charcot-Marie-Tooth disease Sister   . Charcot-Marie-Tooth disease Son   . Charcot-Marie-Tooth disease Other    BP 112/60  Pulse 65  Temp(Src) 98 F (36.7 C) (Oral)  Ht 5\' 9"  (1.753 m)  Wt 181 lb (82.101 kg)  BMI 26.72 kg/m2  SpO2 95%  Review of Systems    Constitutional: Negative for fever and unexpected weight change.  HENT: Negative for hearing loss.   Eyes: Negative for visual disturbance.  Respiratory: Negative for shortness of breath.   Cardiovascular: Negative for chest pain.  Gastrointestinal: Negative for anal bleeding.  Endocrine: Positive for cold intolerance.  Genitourinary: Negative for hematuria.  Musculoskeletal: Negative for back pain.  Skin: Negative for rash.  Allergic/Immunologic: Positive for environmental allergies.  Neurological: Positive for numbness.  Hematological: Does not bruise/bleed easily.  Psychiatric/Behavioral: Negative for dysphoric mood.       Objective:   Physical Exam VS: see vs page GEN: no distress HEAD: head: no deformity eyes: no periorbital swelling, no proptosis external nose and ears are normal mouth: no lesion seen NECK: supple, thyroid is not enlarged CHEST WALL: no deformity LUNGS: clear to auscultation BREASTS:  No gynecomastia CV: reg rate and rhythm, no murmur ABD: abdomen is soft, nontender.  no hepatosplenomegaly.  not distended.  no hernia.   MUSCULOSKELETAL: muscle bulk and strength are grossly normal.  no obvious joint swelling.  gait is normal and steady. PULSES: ? Of right carotid bruit NEURO:  cn 2-12 grossly intact.   readily moves all 4's.   SKIN:  Normal texture and temperature.  No rash or suspicious lesion is visible.   NODES:  None palpable at the neck PSYCH: alert, oriented x3.  Does not appear anxious nor depressed.        Assessment & Plan:  Wellness visit today, with problems stable, except as noted. we discussed code status.  pt requests full code, but would not want to be started or maintained on artificial life-support measures if there was not a reasonable chance of recovery    SEPARATE EVALUATION FOLLOWS--EACH PROBLEM HERE IS NEW, NOT RESPONDING TO TREATMENT, OR POSES SIGNIFICANT RISK TO THE PATIENT'S HEALTH: HISTORY OF THE PRESENT ILLNESS: AF  is noted on ecg today, apparently new.  pt states he feels well in general. He takes fe tabs for anemia. PAST MEDICAL HISTORY reviewed and up to date today REVIEW OF SYSTEMS: Denies syncope. PHYSICAL EXAMINATION: VITAL SIGNS:  See vs page GENERAL: no distress LAB/XRAY RESULTS: i reviewed electrocardiogram.  Lab Results  Component Value Date   WBC 4.4* 12/15/2012   HGB 11.6* 12/15/2012   HCT 34.6* 12/15/2012   MCV  90.8 12/15/2012   PLT 168.0 12/15/2012  IMPRESSION: Anemia, persistent despite normal fe level AF, new PLAN: See instruction page We'll follow anemia

## 2012-12-16 ENCOUNTER — Telehealth: Payer: Self-pay

## 2012-12-16 NOTE — Telephone Encounter (Signed)
Called [employer] representative and spoke with [employer]. She stated that the form she gave to Mr. Siebenaler should not have stated how many pages were included in the fax. She stated that she needed the dx code before faxing the rest. I explained to her that the Dr wanted the rest of the forms. She stated that she would discuss with someone at her office about putting that dx codes on the sheet and have it faxed to [name].

## 2012-12-16 NOTE — Telephone Encounter (Signed)
Ok, dx code is 59.1

## 2012-12-24 ENCOUNTER — Ambulatory Visit (INDEPENDENT_AMBULATORY_CARE_PROVIDER_SITE_OTHER): Payer: Medicare Other | Admitting: Cardiovascular Disease

## 2012-12-24 ENCOUNTER — Encounter: Payer: Self-pay | Admitting: Cardiovascular Disease

## 2012-12-24 VITALS — BP 126/64 | HR 62 | Ht 69.0 in | Wt 186.0 lb

## 2012-12-24 DIAGNOSIS — R079 Chest pain, unspecified: Secondary | ICD-10-CM

## 2012-12-24 DIAGNOSIS — I2510 Atherosclerotic heart disease of native coronary artery without angina pectoris: Secondary | ICD-10-CM

## 2012-12-24 DIAGNOSIS — I4891 Unspecified atrial fibrillation: Secondary | ICD-10-CM

## 2012-12-24 NOTE — Patient Instructions (Signed)
Your physician recommends that you schedule a follow-up appointment in:  3-4 weeks.   Your physician has requested that you have an echocardiogram. Echocardiography is a painless test that uses sound waves to create images of your heart. It provides your doctor with information about the size and shape of your heart and how well your heart's chambers and valves are working. This procedure takes approximately one hour. There are no restrictions for this procedure.   Your physician has requested that you have a lexiscan myoview. For further information please visit [email or web address] Please follow instruction sheet, as given.   Your physician has recommended that you wear a holter monitor. Holter monitors are medical devices that record the heart's electrical activity. Doctors most often use these monitors to diagnose arrhythmias. Arrhythmias are problems with the speed or rhythm of the heartbeat. The monitor is a small, portable device. You can wear one while you do your normal daily activities. This is usually used to diagnose what is causing palpitations/syncope (passing out).

## 2012-12-24 NOTE — Progress Notes (Signed)
History of Present Illness: 79 yo male with history of CAD with 5V CABG in 1995 with most recent relook cath 2006, DM, HTN, former smoker who is here today for cardiac follow up. He had been followed in the past by Dr. Juanda Chance. I met him in April 2012 and he had c/o chest pains. Stress myoview April 2012 wit no ischemia. He has not been seen in our office since then.   He is here today for follow up. He tells me that he has been feeling well. He has stable angina with episodes of chest pain once every 3 weeks. Lasts for 30 minutes. He reports constant SOB secondary to his lung disease. He was in primary care last week and EKG noted to be irregular with possible atrial fibrillation. He has had no prior atrial fibrillation.    Primary Care Physician: Everardo All  Last Lipid Profile:Lipid Panel     Component Value Date/Time   CHOL 137 12/15/2012 1010   TRIG 74.0 12/15/2012 1010   HDL 51.70 12/15/2012 1010   CHOLHDL 3 12/15/2012 1010   VLDL 14.8 12/15/2012 1010   LDLCALC 71 12/15/2012 1010     Past Medical History  Diagnosis Date  . Coronary artery disease   . Leukopenia   . Smoker     quit 1968--25 ppy  . H/O: asbestos exposure   . Pernicious anemia   . ANEMIA, IRON DEFICIENCY   . COPD (chronic obstructive pulmonary disease)   . BACK PAIN, LUMBAR   . Hypertension   . GERD (gastroesophageal reflux disease)   . DIABETES MELLITUS, TYPE II   . Charcot-Marie-Tooth disease     assoiciated muscular dystrophy     Past Surgical History  Procedure Laterality Date  . Coronary artery bypass graft  1995    Current Outpatient Prescriptions  Medication Sig Dispense Refill  . Ascorbic Acid (VITAMIN C) 500 MG tablet Take 500 mg by mouth 2 (two) times daily.       Marland Kitchen aspirin 325 MG tablet Take 325 mg by mouth daily.        . clotrimazole-betamethasone (LOTRISONE) cream Apply topically 2 (two) times daily as needed.       . cyanocobalamin 1000 MCG tablet Take 500 mcg by mouth 2 (two) times  daily.       Marland Kitchen dexlansoprazole (KAPIDEX) 60 MG capsule Take 1 capsule (60 mg total) by mouth daily.  90 capsule  3  . ferrous sulfate (FERRO-BOB) 325 (65 FE) MG tablet Take 325 mg by mouth 2 (two) times daily.       . fluticasone (FLONASE) 50 MCG/ACT nasal spray Place 2 sprays into the nose daily.  16 g  6  . HYDROcodone-acetaminophen (NORCO) 10-325 MG per tablet Take 1 tablet by mouth every 6 (six) hours as needed for pain.  30 tablet  0  . Inositol Niacinate (NIACIN FLUSH FREE) 500 MG CAPS Take by mouth.      . metFORMIN (GLUCOPHAGE) 1000 MG tablet Take 1 tablet (1,000 mg total) by mouth 2 (two) times daily with a meal.  360 tablet  3  . montelukast (SINGULAIR) 10 MG tablet Take 1 tablet (10 mg total) by mouth at bedtime.  90 tablet  3  . Multiple Vitamins-Minerals (MULTIVITAMIN WITH MINERALS) tablet Take 1 tablet by mouth daily.        . niacin 500 MG tablet Take 1 tablet (500 mg total) by mouth daily with breakfast.  90 tablet  3  .  sertraline (ZOLOFT) 50 MG tablet Take 1 tablet (50 mg total) by mouth daily.  90 tablet  3  . simvastatin (ZOCOR) 80 MG tablet Take 1 tablet (80 mg total) by mouth at bedtime.  90 tablet  3  . traMADol (ULTRAM) 50 MG tablet Take 1 tablet (50 mg total) by mouth every 6 (six) hours as needed.  100 tablet  3  . Zinc 50 MG CAPS Take 1 capsule by mouth.        . nitroGLYCERIN (NITROSTAT) 0.4 MG SL tablet Place 0.4 mg under the tongue every 5 (five) minutes as needed.       Current Facility-Administered Medications  Medication Dose Route Frequency Provider Last Rate Last Dose  . cyanocobalamin ((VITAMIN B-12)) injection 1,000 mcg  1,000 mcg Intramuscular Once Romero Belling, MD        No Known Allergies  History   Social History  . Marital Status: Widowed    Spouse Name: N/A    Number of Children: N/A  . Years of Education: N/A   Occupational History  . Retired     Games developer   Social History Main Topics  . Smoking status: Former Smoker -- 1.00  packs/day    Types: Cigarettes    Quit date: 01/01/1966  . Smokeless tobacco: Never Used  . Alcohol Use: No  . Drug Use: No  . Sexual Activity: Not on file   Other Topics Concern  . Not on file   Social History Narrative   Was in service during [place] War          Family History  Problem Relation Age of Onset  . Charcot-Marie-Tooth disease Sister   . Charcot-Marie-Tooth disease Brother   . Charcot-Marie-Tooth disease Brother   . Charcot-Marie-Tooth disease Brother   . Charcot-Marie-Tooth disease Sister   . Charcot-Marie-Tooth disease Son   . Charcot-Marie-Tooth disease Other     Review of Systems:  As stated in the HPI and otherwise negative.   BP 126/64  Pulse 62  Ht 5\' 9"  (1.753 m)  Wt 186 lb (84.369 kg)  BMI 27.45 kg/m2  Physical Examination: General: Well developed, well nourished, NAD HEENT: OP clear, mucus membranes moist SKIN: warm, dry. No rashes. Neuro: No focal deficits Musculoskeletal: Muscle strength 5/5 all ext Psychiatric: Mood and affect normal Neck: No JVD, no carotid bruits, no thyromegaly, no lymphadenopathy. Lungs:Clear bilaterally, no wheezes, rhonci, crackles Cardiovascular: Regular rate and rhythm. No murmurs, gallops or rubs. Abdomen:Soft. Bowel sounds present. Non-tender.  Extremities: No lower extremity edema. Pulses are 2 + in the bilateral DP/PT.  EKG: Sinus, rate 61 bpm. Non-specific ST and T wave abnormality.   Assessment and Plan:   1. CAD: he is s/p CABG in 1995. Recent chest pains. Will arrange Lexiscan stress myoview to exclude ischemia. Echo to assess LV size and function, exclude valve disease. Continue ASA and statin.   2. Chest pain: see above.   3. Atrial fibrillation: EKG 12/15/12 with possible junctional rhythm. Cannot exclude atrial fibrillation. Will arrange 48 hour monitor. No anti-coagulation at this time.

## 2012-12-30 ENCOUNTER — Encounter (INDEPENDENT_AMBULATORY_CARE_PROVIDER_SITE_OTHER): Payer: Medicare Other

## 2012-12-30 ENCOUNTER — Encounter: Payer: Self-pay | Admitting: *Deleted

## 2012-12-30 DIAGNOSIS — I4891 Unspecified atrial fibrillation: Secondary | ICD-10-CM

## 2012-12-30 NOTE — Progress Notes (Signed)
Patient ID: [name], male   DOB: Jan 29, 1931, 79 y.o.   MRN: [id number] E Cardio 24hr holter monitor applied

## 2013-01-07 ENCOUNTER — Other Ambulatory Visit: Payer: Self-pay

## 2013-01-07 MED ORDER — SIMVASTATIN 80 MG PO TABS: 80.0000 mg | ORAL_TABLET | Freq: Every day | ORAL | Status: DC

## 2013-01-07 MED ORDER — MONTELUKAST SODIUM 10 MG PO TABS: 10.0000 mg | ORAL_TABLET | Freq: Every day | ORAL | Status: DC

## 2013-01-07 MED ORDER — METFORMIN HCL 1000 MG PO TABS: 1000.0000 mg | ORAL_TABLET | Freq: Two times a day (BID) | ORAL | Status: DC

## 2013-01-07 MED ORDER — NIACIN 500 MG PO TABS: 500.0000 mg | ORAL_TABLET | Freq: Every day | ORAL | Status: DC

## 2013-01-07 MED ORDER — SERTRALINE HCL 50 MG PO TABS: 50.0000 mg | ORAL_TABLET | Freq: Every day | ORAL | Status: DC

## 2013-01-07 NOTE — Telephone Encounter (Signed)
Medication refill for metformin, singular, niacin, zoloft and zocor

## 2013-01-08 ENCOUNTER — Encounter (HOSPITAL_COMMUNITY): Payer: Self-pay | Admitting: Radiology

## 2013-01-08 ENCOUNTER — Encounter: Payer: Self-pay | Admitting: Cardiology

## 2013-01-08 ENCOUNTER — Telehealth (HOSPITAL_COMMUNITY): Payer: Self-pay | Admitting: Radiology

## 2013-01-08 ENCOUNTER — Ambulatory Visit (HOSPITAL_COMMUNITY): Payer: Medicare Other | Admitting: Radiology

## 2013-01-08 NOTE — Progress Notes (Signed)
Patient ID: [name], male   DOB: 1931/08/14, 80 y.o.   MRN: [id number] [name] was in our office for a nuclear lexiscan today (01/08/13). As hard as he and we tried he was unable to proceed with the nuclear stress test. He was unable to lie flat with his head elevated and hooked up to a c-pap. Sorry.

## 2013-01-08 NOTE — Telephone Encounter (Signed)
This came to me as a phone note but there is no note. Any idea what this is? chris

## 2013-01-08 NOTE — Progress Notes (Unsigned)
[facility] SITE [facility] NUCLEAR MED [address] [place], [place] [address] [phone]    Cardiology Nuclear Med Study  David PaulsFrank J [name] is a 80 y.o. male     MRN : [id number]     DOB: [birth year]  Procedure Date: 01/08/2013  Nuclear Med Background Indication for Stress Test:  Evaluation for Ischemia and Graft Patency History:  {CHL HISTORY STRESS JJOA:41660}TEST:22964} Cardiac Risk Factors: Family History - CAD, Hypertension, NIDDM and PVD  Symptoms:  {CHL SYMPTOMS STRESS TEST:21021013}   Nuclear Pre-Procedure Caffeine/Decaff Intake:  None NPO After: 7:30am   Lungs:  {Exam; lungs brief:12271} O2 Sat: ***% on {Exam; oxygen delivery:30093}. IV 0.9% NS with Angio Cath:  22g  IV Site: R Antecubital  IV Started by:  Bonnita LevanJackie Edith Lord, RN  Chest Size (in):  44 Cup Size: n/a  Height: 5\' 9"  (1.753 m)  Weight:  192 lb (87.091 kg)  BMI:  Body mass index is 28.34 kg/(m^2). Tech Comments:  N/A    Nuclear Med Study 1 or 2 day study: 1 day  Stress Test Type:  Lexiscan  Reading MD: N/A  Order Authorizing Provider:  Verne Carrowhristopher McAlhany, MD  Resting Radionuclide: Technetium 7518m Sestamibi  Resting Radionuclide Dose: *** mCi   Stress Radionuclide:  Technetium 3518m Sestamibi  Stress Radionuclide Dose: *** mCi           Stress Protocol Rest HR: *** Stress HR: ***  Rest BP: *** Stress BP: ***  Exercise Time (min): {NA AND WILDCARD:21589} METS: {NA AND YTKZSWFU:93235}WILDCARD:21589}           Dose of Adenosine (mg):  {NA AND TDDUKGUR:42706}WILDCARD:21589} Dose of Lexiscan: {CHL CARD WILDCARD AND 0.4:21590} mg  Dose of Atropine (mg): {NA AND CBJSEGBT:51761}WILDCARD:21589} Dose of Dobutamine: {NA AND WILDCARD:21589} mcg/kg/min (at max HR)  Stress Test Technologist: {CHL LB STRESS TEST TECHNOLOGIST:21021024}  Nuclear Technologist:  {CHL LB NUCLEAR TECHNOLOGIST:21021025}     Rest Procedure:  {CHL REST PROCEDURE NUCLEAR:21021027} Rest ECG: {CHL REST YWV:37106}ECG:21559}  Stress Procedure:  {CHL STRESS PROCEDURE NUCLEAR:21021028} Stress ECG: {CHL CAR  STRESS ECG:21561}  QPS Raw Data Images:  {CHL RAW DATA IMAGES NUC:21021029} Stress Images:  {CHL STRESS IMAGES NUC:21021030} Rest Images:  {CHL REST IMAGES NUC:21021031} Subtraction (SDS):  {CHL SUBTRACTION (SDS) NUC:21021032} Transient Ischemic Dilatation (Normal <1.22):  *** Lung/Heart Ratio (Normal <0.45):  ***  Quantitative Gated Spect Images QGS EDV:  *** ml QGS ESV:  *** ml  Impression Exercise Capacity:  {CHL EXERCISE CAPACITY NUC:21021037} BP Response:  {CHL BP RESPONSE NUC:21021038} Clinical Symptoms:  {CHL CLINICAL SYMPTOMS NUC:21021039} ECG Impression:  {CHL ECG IMPRESSION NUC:21021040} Comparison with Prior Nuclear Study: {CHL NUCLEAR STUDY COMPARISON:21562}  Overall Impression:  {CHL OVERALL IMPRESSION NUC:21021041}  LV Ejection Fraction: {CHL CARD STUDY NOT GATED:21592:o}.  LV Wall Motion:  {CHL CARD QGS:21591:o}

## 2013-01-09 ENCOUNTER — Telehealth: Payer: Self-pay

## 2013-01-09 NOTE — Telephone Encounter (Signed)
Updated AVS faxed to [facility].

## 2013-01-09 NOTE — Telephone Encounter (Signed)
Ok, i have addended the note

## 2013-01-09 NOTE — Progress Notes (Signed)
   Subjective:    Patient ID: [name], male    DOB: Apr 29, 1931, 80 y.o.   MRN: [id number]  HPI    Review of Systems     Objective:   Physical Exam        Assessment & Plan:  under medicare comprehensive plan of care pt needs diabetic shoes.

## 2013-01-09 NOTE — Telephone Encounter (Signed)
Jonana from [employer] called requesting that from previous office visit that a note be made stating " under medicare comprehensive plan of care pt needs diabetic shoes." Mardene CelesteJoanna states for shoes to be cover this needs to be in the note. Please advise,  Thanks!

## 2013-01-09 NOTE — Telephone Encounter (Signed)
See documentation note from 1/8. Pt was unable to complete lexiscan. Dr. Clifton JamesMcAlhany aware.

## 2013-01-12 ENCOUNTER — Ambulatory Visit (HOSPITAL_COMMUNITY): Payer: Medicare HMO | Attending: Cardiovascular Disease | Admitting: Radiology

## 2013-01-12 ENCOUNTER — Encounter: Payer: Self-pay | Admitting: Cardiology

## 2013-01-12 DIAGNOSIS — I079 Rheumatic tricuspid valve disease, unspecified: Secondary | ICD-10-CM | POA: Diagnosis not present

## 2013-01-12 DIAGNOSIS — E119 Type 2 diabetes mellitus without complications: Secondary | ICD-10-CM | POA: Insufficient documentation

## 2013-01-12 DIAGNOSIS — R079 Chest pain, unspecified: Secondary | ICD-10-CM

## 2013-01-12 DIAGNOSIS — Z87891 Personal history of nicotine dependence: Secondary | ICD-10-CM | POA: Insufficient documentation

## 2013-01-12 DIAGNOSIS — I4891 Unspecified atrial fibrillation: Secondary | ICD-10-CM | POA: Insufficient documentation

## 2013-01-12 DIAGNOSIS — I10 Essential (primary) hypertension: Secondary | ICD-10-CM | POA: Insufficient documentation

## 2013-01-12 DIAGNOSIS — I359 Nonrheumatic aortic valve disorder, unspecified: Secondary | ICD-10-CM | POA: Diagnosis not present

## 2013-01-12 DIAGNOSIS — J4489 Other specified chronic obstructive pulmonary disease: Secondary | ICD-10-CM | POA: Insufficient documentation

## 2013-01-12 DIAGNOSIS — I2510 Atherosclerotic heart disease of native coronary artery without angina pectoris: Secondary | ICD-10-CM | POA: Insufficient documentation

## 2013-01-12 DIAGNOSIS — J449 Chronic obstructive pulmonary disease, unspecified: Secondary | ICD-10-CM | POA: Diagnosis not present

## 2013-01-12 DIAGNOSIS — I379 Nonrheumatic pulmonary valve disorder, unspecified: Secondary | ICD-10-CM | POA: Diagnosis not present

## 2013-01-12 DIAGNOSIS — R0602 Shortness of breath: Secondary | ICD-10-CM | POA: Insufficient documentation

## 2013-01-12 NOTE — Progress Notes (Signed)
Echocardiogram performed.  

## 2013-01-13 ENCOUNTER — Encounter: Payer: Self-pay | Admitting: Cardiovascular Disease

## 2013-01-13 ENCOUNTER — Ambulatory Visit (INDEPENDENT_AMBULATORY_CARE_PROVIDER_SITE_OTHER): Payer: 59 | Admitting: Cardiovascular Disease

## 2013-01-13 VITALS — BP 136/54 | HR 62 | Ht 69.0 in | Wt 193.0 lb

## 2013-01-13 DIAGNOSIS — I498 Other specified cardiac arrhythmias: Secondary | ICD-10-CM

## 2013-01-13 DIAGNOSIS — I2510 Atherosclerotic heart disease of native coronary artery without angina pectoris: Secondary | ICD-10-CM

## 2013-01-13 DIAGNOSIS — I471 Supraventricular tachycardia: Secondary | ICD-10-CM

## 2013-01-13 NOTE — Progress Notes (Signed)
History of Present Illness: 80 yo male with history of CAD with 5V CABG in 1995 with most recent relook cath 2006, DM, HTN, former smoker who is here today for cardiac follow up. He had been followed in the past by Dr. Olevia Perches. I met him in April 2012 and he had c/o chest pains. Stress myoview April 2012 with no ischemia. I saw him 12/24/12 and there was question of atrial fibrillation in primary care. I arranged a 48 hour monitor which showed sinus rhythm with frequent PACs, several runs of SVT. Echo 01/12/13 with normal LV size and function, mild AI and TR. Probable mild pulm HTN. He could not lay flat for stress myoview images due to diaphragm issues, chronic problem.   He is here today for follow up. He tells me that he has been feeling well. He has stable angina with episodes of chest pain once every 3 weeks. Lasts for 30 minutes. He reports constant SOB secondary to his lung disease. No changes since last visit. No awareness of palpitations.   Primary Care Physician: Loanne Drilling  Last Lipid Profile:Lipid Panel     Component Value Date/Time   CHOL 137 12/15/2012 1010   TRIG 74.0 12/15/2012 1010   HDL 51.70 12/15/2012 1010   CHOLHDL 3 12/15/2012 1010   VLDL 14.8 12/15/2012 1010   LDLCALC 71 12/15/2012 1010     Past Medical History  Diagnosis Date  . Coronary artery disease   . Leukopenia   . Smoker     quit 1968--25 ppy  . H/O: asbestos exposure   . Pernicious anemia   . ANEMIA, IRON DEFICIENCY   . COPD (chronic obstructive pulmonary disease)   . BACK PAIN, LUMBAR   . Hypertension   . GERD (gastroesophageal reflux disease)   . DIABETES MELLITUS, TYPE II   . Charcot-Marie-Tooth disease     assoiciated muscular dystrophy     Past Surgical History  Procedure Laterality Date  . Coronary artery bypass graft  1995    Current Outpatient Prescriptions  Medication Sig Dispense Refill  . Ascorbic Acid (VITAMIN C) 500 MG tablet Take 500 mg by mouth 2 (two) times daily.       Marland Kitchen  aspirin 325 MG tablet Take 325 mg by mouth daily.        . clotrimazole-betamethasone (LOTRISONE) cream Apply topically 2 (two) times daily as needed.       . Cyanocobalamin (VITAMIN B-12 IJ) Inject as directed. GET A B12 INJECTION ONCE EVERY MONTH      . cyanocobalamin 1000 MCG tablet Take 500 mcg by mouth 2 (two) times daily.       Marland Kitchen dexlansoprazole (KAPIDEX) 60 MG capsule Take 1 capsule (60 mg total) by mouth daily.  90 capsule  3  . ferrous sulfate (FERRO-BOB) 325 (65 FE) MG tablet Take 325 mg by mouth 2 (two) times daily.       . fluticasone (FLONASE) 50 MCG/ACT nasal spray Place 2 sprays into the nose daily.  16 g  6  . HYDROcodone-acetaminophen (NORCO) 10-325 MG per tablet Take 1 tablet by mouth every 6 (six) hours as needed for pain.  30 tablet  0  . Inositol Niacinate (NIACIN FLUSH FREE) 500 MG CAPS Take by mouth.      . metFORMIN (GLUCOPHAGE) 1000 MG tablet Take 1 tablet (1,000 mg total) by mouth 2 (two) times daily with a meal.  270 tablet  0  . montelukast (SINGULAIR) 10 MG tablet Take 1 tablet (  10 mg total) by mouth at bedtime.  90 tablet  0  . Multiple Vitamins-Minerals (MULTIVITAMIN WITH MINERALS) tablet Take 1 tablet by mouth daily.        . niacin 500 MG tablet Take 1 tablet (500 mg total) by mouth daily with breakfast.  90 tablet  0  . nitroGLYCERIN (NITROSTAT) 0.4 MG SL tablet Place 0.4 mg under the tongue every 5 (five) minutes as needed.      . sertraline (ZOLOFT) 50 MG tablet Take 1 tablet (50 mg total) by mouth daily.  90 tablet  0  . simvastatin (ZOCOR) 80 MG tablet Take 1 tablet (80 mg total) by mouth at bedtime.  90 tablet  0  . traMADol (ULTRAM) 50 MG tablet Take 1 tablet (50 mg total) by mouth every 6 (six) hours as needed.  100 tablet  3  . Zinc 50 MG CAPS Take 1 capsule by mouth.         No current facility-administered medications for this visit.    No Known Allergies  History   Social History  . Marital Status: Widowed    Spouse Name: N/A    Number of  Children: N/A  . Years of Education: N/A   Occupational History  . Retired     Engineer, building services   Social History Main Topics  . Smoking status: Former Smoker -- 1.00 packs/day    Types: Cigarettes    Quit date: 01/01/1966  . Smokeless tobacco: Never Used  . Alcohol Use: No  . Drug Use: No  . Sexual Activity: Not on file   Other Topics Concern  . Not on file   Social History Narrative   Was in service during [place] War          Family History  Problem Relation Age of Onset  . Charcot-Marie-Tooth disease Sister   . Charcot-Marie-Tooth disease Brother   . Charcot-Marie-Tooth disease Brother   . Charcot-Marie-Tooth disease Brother   . Charcot-Marie-Tooth disease Sister   . Charcot-Marie-Tooth disease Son   . Charcot-Marie-Tooth disease Other     Review of Systems:  As stated in the HPI and otherwise negative.   BP 136/54  Pulse 62  Ht 5' 9" (1.753 m)  Wt 193 lb (87.544 kg)  BMI 28.49 kg/m2  SpO2 96%  Physical Examination: General: Well developed, well nourished, NAD HEENT: OP clear, mucus membranes moist SKIN: warm, dry. No rashes. Neuro: No focal deficits Musculoskeletal: Muscle strength 5/5 all ext Psychiatric: Mood and affect normal Neck: No JVD, no carotid bruits, no thyromegaly, no lymphadenopathy. Lungs:Clear bilaterally, no wheezes, rhonci, crackles Cardiovascular: Regular rate and rhythm. No murmurs, gallops or rubs. Abdomen:Soft. Bowel sounds present. Non-tender.  Extremities: No lower extremity edema. Pulses are 2 + in the bilateral DP/PT.  Echo 01/12/13: Left ventricle: The cavity size was normal. There was mild focal basal hypertrophy of the septum. Systolic function was normal. The estimated ejection fraction was in the range of 55% to 60%. Wall motion was normal; there were no regional wall motion abnormalities. - Aortic valve: Mild regurgitation. - Left atrium: The atrium was mildly dilated. - Right ventricle: The cavity size was mildly  dilated. Wall thickness was normal. - Tricuspid valve: Mild-moderate regurgitation. - Pulmonary arteries: PA peak pressure: 2m Hg (S). Impressions:  - The right ventricular systolic pressure was increased consistent with mild pulmonary hypertension.  Assessment and Plan:   1. CAD: he is s/p CABG in 1995. He has stable angina. He could not lay flat  for the stress test images. This is a chronic issue due to his lung/diaphragm issues. Continue ASA and statin. No beta blocker with bradycardia.   2. SVT: He is noted to have several episodes of SVT on monitor. He also has junctional bradycardia with lowest rate 41 bpm at night while sleeping. I have discussed this with pt and family. Will not start a rate control agent at this time since he feels well and I do not want to drop his heart rate any further. If he has symptoms from SVT, will need EP referral as a pacemaker would likely be needed for rate backup if medical therapy is pursued to rate control.

## 2013-01-13 NOTE — Patient Instructions (Signed)
Your physician wants you to follow-up in:  6 months. You will receive a reminder letter in the mail two months in advance. If you don't receive a letter, please call our office to schedule the follow-up appointment.   

## 2013-02-03 ENCOUNTER — Telehealth: Payer: Self-pay

## 2013-02-03 DIAGNOSIS — G600 Hereditary motor and sensory neuropathy: Secondary | ICD-10-CM

## 2013-02-03 NOTE — Telephone Encounter (Signed)
Pt called requesting a referral for Dr. Bary Lericheicori at [facility] in [place]. Also pt states that he needs a refill for tramadol. Pt states that he could wait till you return to refill.  Please advise on both,  Thanks!

## 2013-02-03 NOTE — Telephone Encounter (Signed)
i did referral. i'll do refill when i return

## 2013-02-04 MED ORDER — TRAMADOL HCL 50 MG PO TABS: 50.0000 mg | ORAL_TABLET | Freq: Four times a day (QID) | ORAL | Status: DC | PRN

## 2013-02-04 NOTE — Telephone Encounter (Signed)
Ok to print tramadol script? Thanks!

## 2013-02-04 NOTE — Telephone Encounter (Signed)
Script faxed to pharmacy

## 2013-02-04 NOTE — Telephone Encounter (Signed)
i printed 

## 2013-02-12 ENCOUNTER — Ambulatory Visit (INDEPENDENT_AMBULATORY_CARE_PROVIDER_SITE_OTHER): Payer: Medicare HMO

## 2013-02-12 VITALS — BP 164/72 | HR 61 | Resp 18

## 2013-02-12 DIAGNOSIS — E1149 Type 2 diabetes mellitus with other diabetic neurological complication: Secondary | ICD-10-CM

## 2013-02-12 DIAGNOSIS — E1142 Type 2 diabetes mellitus with diabetic polyneuropathy: Secondary | ICD-10-CM

## 2013-02-12 DIAGNOSIS — Q828 Other specified congenital malformations of skin: Secondary | ICD-10-CM

## 2013-02-12 DIAGNOSIS — E1140 Type 2 diabetes mellitus with diabetic neuropathy, unspecified: Secondary | ICD-10-CM

## 2013-02-12 NOTE — Progress Notes (Signed)
   Subjective:    Patient ID: [name], male    DOB: 1931-08-14, 80 y.o.   MRN: [id number]  HPI just need a trim of the callus on the ball of the left foot    Review of Systems no changes or new findings noted    Objective:   Physical Exam Vascular status is intact left foot is examined this time is palpable DP plus one over 4 PT one over 4 left foot. Neurologically has profound neuropathy and dropfoot deformity secondary CMT as well as diabetic neuropathy. Decreased epicritic and progressive sensations noted. There is keratoses sub-first MTP area left foot nails show thickening dystrophy discoloration although they're not overgrown only for trimming at this time. No secondary infection is noted no other complaints of pain or discomfort no open wounds or ulcers noted.       Assessment & Plan:  Patient is a history of diabetes and complications history of ulcerations in the past this time the keratoses sub-first left is debrided treated with some Neosporin and a single bandage was applied were pinpoint bleeding was identified. Followup as needed in the future for continued palliative care. Maintain appropriate coming shoes at all times patient wear special shoes and upper braces as instructed  Alvan Dameichard Orli Degrave DPM

## 2013-02-12 NOTE — Patient Instructions (Signed)
Diabetes and Foot Care Diabetes may cause you to have problems because of poor blood supply (circulation) to your feet and legs. This may cause the skin on your feet to become thinner, break easier, and heal more slowly. Your skin may become dry, and the skin may peel and crack. You may also have nerve damage in your legs and feet causing decreased feeling in them. You may not notice minor injuries to your feet that could lead to infections or more serious problems. Taking care of your feet is one of the most important things you can do for yourself.  HOME CARE INSTRUCTIONS  Wear shoes at all times, even in the house. Do not go barefoot. Bare feet are easily injured.  Check your feet daily for blisters, cuts, and redness. If you cannot see the bottom of your feet, use a mirror or ask someone for help.  Wash your feet with warm water (do not use hot water) and mild soap. Then pat your feet and the areas between your toes until they are completely dry. Do not soak your feet as this can dry your skin.  Apply a moisturizing lotion or petroleum jelly (that does not contain alcohol and is unscented) to the skin on your feet and to dry, brittle toenails. Do not apply lotion between your toes.  Trim your toenails straight across. Do not dig under them or around the cuticle. File the edges of your nails with an emery board or nail file.  Do not cut corns or calluses or try to remove them with medicine.  Wear clean socks or stockings every day. Make sure they are not too tight. Do not wear knee-high stockings since they may decrease blood flow to your legs.  Wear shoes that fit properly and have enough cushioning. To break in new shoes, wear them for just a few hours a day. This prevents you from injuring your feet. Always look in your shoes before you put them on to be sure there are no objects inside.  Do not cross your legs. This may decrease the blood flow to your feet.  If you find a minor scrape,  cut, or break in the skin on your feet, keep it and the skin around it clean and dry. These areas may be cleansed with mild soap and water. Do not cleanse the area with peroxide, alcohol, or iodine.  When you remove an adhesive bandage, be sure not to damage the skin around it.  If you have a wound, look at it several times a day to make sure it is healing.  Do not use heating pads or hot water bottles. They may burn your skin. If you have lost feeling in your feet or legs, you may not know it is happening until it is too late.  Make sure your health care provider performs a complete foot exam at least annually or more often if you have foot problems. Report any cuts, sores, or bruises to your health care provider immediately. SEEK MEDICAL CARE IF:   You have an injury that is not healing.  You have cuts or breaks in the skin.  You have an ingrown nail.  You notice redness on your legs or feet.  You feel burning or tingling in your legs or feet.  You have pain or cramps in your legs and feet.  Your legs or feet are numb.  Your feet always feel cold. SEEK IMMEDIATE MEDICAL CARE IF:   There is increasing redness,   swelling, or pain in or around a wound.  There is a red line that goes up your leg.  Pus is coming from a wound.  You develop a fever or as directed by your health care provider.  You notice a bad smell coming from an ulcer or wound. Document Released: 12/16/1999 Document Revised: 08/20/2012 Document Reviewed: 05/27/2012 [employer] Patient Information 2014 [employer].  

## 2013-02-27 ENCOUNTER — Other Ambulatory Visit: Payer: Self-pay | Admitting: Endocrinology

## 2013-02-27 MED ORDER — TRAMADOL HCL 50 MG PO TABS: 50.0000 mg | ORAL_TABLET | Freq: Four times a day (QID) | ORAL | Status: DC | PRN

## 2013-03-09 ENCOUNTER — Other Ambulatory Visit: Payer: Self-pay | Admitting: Endocrinology

## 2013-03-10 ENCOUNTER — Other Ambulatory Visit: Payer: Self-pay | Admitting: *Deleted

## 2013-03-10 MED ORDER — METFORMIN HCL 1000 MG PO TABS: 1000.0000 mg | ORAL_TABLET | Freq: Two times a day (BID) | ORAL | Status: DC

## 2013-03-10 MED ORDER — SERTRALINE HCL 50 MG PO TABS: 50.0000 mg | ORAL_TABLET | Freq: Every day | ORAL | Status: DC

## 2013-03-10 MED ORDER — MONTELUKAST SODIUM 10 MG PO TABS: 10.0000 mg | ORAL_TABLET | Freq: Every day | ORAL | Status: DC

## 2013-03-10 MED ORDER — SIMVASTATIN 80 MG PO TABS: 80.0000 mg | ORAL_TABLET | Freq: Every day | ORAL | Status: DC

## 2013-05-04 ENCOUNTER — Ambulatory Visit (INDEPENDENT_AMBULATORY_CARE_PROVIDER_SITE_OTHER): Payer: Commercial Managed Care - HMO | Admitting: Endocrinology

## 2013-05-04 ENCOUNTER — Encounter: Payer: Self-pay | Admitting: Endocrinology

## 2013-05-04 VITALS — BP 116/58 | HR 59 | Temp 98.4°F | Ht 69.0 in | Wt 180.0 lb

## 2013-05-04 DIAGNOSIS — R6889 Other general symptoms and signs: Secondary | ICD-10-CM

## 2013-05-04 DIAGNOSIS — R1011 Right upper quadrant pain: Secondary | ICD-10-CM

## 2013-05-04 DIAGNOSIS — D510 Vitamin B12 deficiency anemia due to intrinsic factor deficiency: Secondary | ICD-10-CM

## 2013-05-04 LAB — BASIC METABOLIC PANEL
BUN: 19 mg/dL (ref 6–23)
CHLORIDE: 106 meq/L (ref 96–112)
CO2: 25 mEq/L (ref 19–32)
CREATININE: 0.8 mg/dL (ref 0.4–1.5)
Calcium: 9.6 mg/dL (ref 8.4–10.5)
GFR: 101.93 mL/min (ref >60.00)
Glucose, Bld: 71 mg/dL (ref 70–99)
POTASSIUM: 4.5 meq/L (ref 3.5–5.1)
Sodium: 140 mEq/L (ref 135–145)

## 2013-05-04 LAB — CBC WITH DIFFERENTIAL/PLATELET
Basophils Absolute: 0 10*3/uL (ref 0.0–0.1)
Basophils Relative: 0.5 % (ref 0.0–3.0)
EOS PCT: 4.7 % (ref 0.0–5.0)
Eosinophils Absolute: 0.2 10*3/uL (ref 0.0–0.7)
HEMATOCRIT: 35.3 % — AB (ref 39.0–52.0)
HEMOGLOBIN: 11.7 g/dL — AB (ref 13.0–17.0)
LYMPHS ABS: 1.1 10*3/uL (ref 0.7–4.0)
LYMPHS PCT: 25.8 % (ref 12.0–46.0)
MCHC: 33.1 g/dL (ref 30.0–36.0)
MCV: 92.2 fl (ref 78.0–100.0)
Monocytes Absolute: 0.5 10*3/uL (ref 0.1–1.0)
Monocytes Relative: 13.2 % — ABNORMAL HIGH (ref 3.0–12.0)
Neutro Abs: 2.3 10*3/uL (ref 1.4–7.7)
Neutrophils Relative %: 55.8 % (ref 43.0–77.0)
Platelets: 175 10*3/uL (ref 150.0–400.0)
RBC: 3.83 Mil/uL — ABNORMAL LOW (ref 4.22–5.81)
RDW: 13.4 % (ref 11.5–14.6)
WBC: 4.1 10*3/uL — ABNORMAL LOW (ref 4.5–10.5)

## 2013-05-04 LAB — HEPATIC FUNCTION PANEL
ALK PHOS: 38 U/L — AB (ref 39–117)
ALT: 17 U/L (ref 0–53)
AST: 27 U/L (ref 0–37)
Albumin: 4.1 g/dL (ref 3.5–5.2)
BILIRUBIN DIRECT: 0 mg/dL (ref 0.0–0.3)
BILIRUBIN TOTAL: 0.2 mg/dL (ref 0.2–1.2)
Total Protein: 6.6 g/dL (ref 6.0–8.3)

## 2013-05-04 LAB — AMYLASE: AMYLASE: 33 U/L (ref 27–131)

## 2013-05-04 LAB — TSH: TSH: 0.9 u[IU]/mL (ref 0.35–5.50)

## 2013-05-04 MED ORDER — CYANOCOBALAMIN 1000 MCG/ML IJ SOLN
1000.0000 ug | Freq: Once | INTRAMUSCULAR | Status: AC
  Administered 2013-05-04: 1000 ug via INTRAMUSCULAR

## 2013-05-04 NOTE — Patient Instructions (Addendum)
Let's check an ultrasound.  blood and urine tests are being requested for you today.  We'll contact you with results.   I hope you feel better soon.  If you don't feel better in a few days, please call back.  Please call sooner if you get worse.  Loratadine-d (non-prescription) will help your congestion.

## 2013-05-04 NOTE — Progress Notes (Signed)
Subjective:    Patient ID: [name], male    DOB: 12/01/1931, 80 y.o.   MRN: [id number]  HPI Pt states 1 month of moderate pain rad from the RUQ to the mid-back.  No assoc fever.   Past Medical History  Diagnosis Date  . Coronary artery disease   . Leukopenia   . Smoker     quit 1968--25 ppy  . H/O: asbestos exposure   . Pernicious anemia   . ANEMIA, IRON DEFICIENCY   . COPD (chronic obstructive pulmonary disease)   . BACK PAIN, LUMBAR   . Hypertension   . GERD (gastroesophageal reflux disease)   . DIABETES MELLITUS, TYPE II   . Charcot-Marie-Tooth disease     assoiciated muscular dystrophy     Past Surgical History  Procedure Laterality Date  . Coronary artery bypass graft  1995    History   Social History  . Marital Status: Widowed    Spouse Name: N/A    Number of Children: N/A  . Years of Education: N/A   Occupational History  . Retired     Games developerdiesel mechanic   Social History Main Topics  . Smoking status: Former Smoker -- 1.00 packs/day    Types: Cigarettes    Quit date: 01/01/1966  . Smokeless tobacco: Never Used  . Alcohol Use: No  . Drug Use: No  . Sexual Activity: Not on file   Other Topics Concern  . Not on file   Social History Narrative   Was in service during [place]          Current Outpatient Prescriptions on File Prior to Visit  Medication Sig Dispense Refill  . amoxicillin-clavulanate (AUGMENTIN) 875-125 MG per tablet       . Ascorbic Acid (VITAMIN C) 500 MG tablet Take 500 mg by mouth 2 (two) times daily.       Marland Kitchen. aspirin 325 MG tablet Take 325 mg by mouth daily.        . clotrimazole-betamethasone (LOTRISONE) cream Apply topically 2 (two) times daily as needed.       . Cyanocobalamin (VITAMIN B-12 IJ) Inject as directed. GET A B12 INJECTION ONCE EVERY MONTH      . cyanocobalamin 1000 MCG tablet Take 500 mcg by mouth 2 (two) times daily.       Marland Kitchen. dexlansoprazole (KAPIDEX) 60 MG capsule Take 1 capsule (60 mg total) by mouth  daily.  90 capsule  3  . ferrous sulfate (FERRO-BOB) 325 (65 FE) MG tablet Take 325 mg by mouth 2 (two) times daily.       . fluticasone (FLONASE) 50 MCG/ACT nasal spray Place 2 sprays into the nose daily.  16 g  6  . gabapentin (NEURONTIN) 300 MG capsule 1 or 2 pills by mouth at night as needed for leg pain      . HYDROcodone-acetaminophen (NORCO) 10-325 MG per tablet Take 1 tablet by mouth every 6 (six) hours as needed for pain.  30 tablet  0  . Inositol Niacinate (NIACIN FLUSH FREE) 500 MG CAPS Take by mouth.      . metFORMIN (GLUCOPHAGE) 1000 MG tablet Take 1 tablet (1,000 mg total) by mouth 2 (two) times daily with a meal.  180 tablet  1  . montelukast (SINGULAIR) 10 MG tablet Take 1 tablet (10 mg total) by mouth at bedtime.  90 tablet  1  . Multiple Vitamins-Minerals (MULTIVITAMIN WITH MINERALS) tablet Take 1 tablet by mouth daily.        .Marland Kitchen  niacin 500 MG tablet Take 1 tablet (500 mg total) by mouth daily with breakfast.  90 tablet  0  . sertraline (ZOLOFT) 50 MG tablet Take 1 tablet (50 mg total) by mouth daily.  90 tablet  0  . simvastatin (ZOCOR) 80 MG tablet Take 1 tablet (80 mg total) by mouth at bedtime.  90 tablet  1  . traMADol (ULTRAM) 50 MG tablet Take 1 tablet (50 mg total) by mouth every 6 (six) hours as needed.  360 tablet  1  . Zinc 50 MG CAPS Take 1 capsule by mouth.        . nitroGLYCERIN (NITROSTAT) 0.4 MG SL tablet Place 0.4 mg under the tongue every 5 (five) minutes as needed.       No current facility-administered medications on file prior to visit.   No Known Allergies  Family History  Problem Relation Age of Onset  . Charcot-Marie-Tooth disease Sister   . Charcot-Marie-Tooth disease Brother   . Charcot-Marie-Tooth disease Brother   . Charcot-Marie-Tooth disease Brother   . Charcot-Marie-Tooth disease Sister   . Charcot-Marie-Tooth disease Son   . Charcot-Marie-Tooth disease Other    BP 116/58  Pulse 59  Temp(Src) 98.4 F (36.9 C) (Oral)  Ht 5\' 9"  (1.753 m)   Wt 180 lb (81.647 kg)  BMI 26.57 kg/m2  SpO2 97%  Review of Systems He has nasal congestion (says steroid nasal spray did not help in the past), and cold intolerance.  No rash on the flank.      Objective:   Physical Exam VITAL SIGNS:  See vs page GENERAL: no distress head: no deformity eyes: no periorbital swelling, no proptosis external nose and ears are normal mouth: no lesion seen Both eac's and tm's are normal ABDOMEN: abdomen is soft, nontender.  no hepatosplenomegaly.  not distended.  no hernia.  Lab Results  Component Value Date   WBC 4.1* 05/04/2013   HGB 11.7* 05/04/2013   HCT 35.3* 05/04/2013   MCV 92.2 05/04/2013   PLT 175.0 05/04/2013   Lab Results  Component Value Date   CREATININE 0.8 05/04/2013   BUN 19 05/04/2013   NA 140 05/04/2013   K 4.5 05/04/2013   CL 106 05/04/2013   CO2 25 05/04/2013   Lab Results  Component Value Date   TSH 0.90 05/04/2013      Assessment & Plan:  Flank pain, new, uncertain etiology Allergic rhinitis: new Cold intolerance, uncertain etiology.

## 2013-05-05 ENCOUNTER — Other Ambulatory Visit: Payer: Self-pay | Admitting: Endocrinology

## 2013-05-05 ENCOUNTER — Ambulatory Visit
Admission: RE | Admit: 2013-05-05 | Discharge: 2013-05-05 | Disposition: A | Discharge status: Home / Self Care | Payer: Commercial Managed Care - HMO | Source: Ambulatory Visit | Attending: Endocrinology | Admitting: Endocrinology

## 2013-05-05 DIAGNOSIS — R1011 Right upper quadrant pain: Secondary | ICD-10-CM

## 2013-05-05 LAB — URINALYSIS, ROUTINE W REFLEX MICROSCOPIC
BILIRUBIN URINE: NEGATIVE
HGB URINE DIPSTICK: NEGATIVE
Leukocytes, UA: NEGATIVE
Nitrite: NEGATIVE
PH: 6 (ref 5.0–8.0)
Specific Gravity, Urine: >=1.03 — AB (ref 1.000–1.030)
TOTAL PROTEIN, URINE-UPE24: NEGATIVE
Urine Glucose: NEGATIVE
Urobilinogen, UA: 0.2 (ref 0.0–1.0)

## 2013-05-11 ENCOUNTER — Telehealth: Payer: Self-pay | Admitting: Endocrinology

## 2013-05-11 NOTE — Telephone Encounter (Signed)
Still hurts under right rib

## 2013-05-13 NOTE — Telephone Encounter (Signed)
Requested call back to discuss.  

## 2013-05-13 NOTE — Telephone Encounter (Signed)
Ok, please request ct.  i ordered a few days ago

## 2013-05-13 NOTE — Telephone Encounter (Signed)
Pt call back and stated that the pain on the right side of his ribs has not gotten any better. He reports that his back has gotten better, rib pain presists. Wanted to know if anything could be done. Please advise, Thansk!

## 2013-05-13 NOTE — Telephone Encounter (Signed)
Pt scheduled for CT on 05/15/2013 at 3:30.

## 2013-05-14 ENCOUNTER — Ambulatory Visit (INDEPENDENT_AMBULATORY_CARE_PROVIDER_SITE_OTHER): Payer: Medicare HMO

## 2013-05-14 VITALS — BP 146/72 | HR 55 | Resp 18

## 2013-05-14 DIAGNOSIS — B351 Tinea unguium: Secondary | ICD-10-CM

## 2013-05-14 DIAGNOSIS — M79609 Pain in unspecified limb: Secondary | ICD-10-CM

## 2013-05-14 DIAGNOSIS — E1149 Type 2 diabetes mellitus with other diabetic neurological complication: Secondary | ICD-10-CM

## 2013-05-14 DIAGNOSIS — E1140 Type 2 diabetes mellitus with diabetic neuropathy, unspecified: Secondary | ICD-10-CM

## 2013-05-14 DIAGNOSIS — Q828 Other specified congenital malformations of skin: Secondary | ICD-10-CM

## 2013-05-14 NOTE — Patient Instructions (Signed)
Diabetes and Foot Care Diabetes may cause you to have problems because of poor blood supply (circulation) to your feet and legs. This may cause the skin on your feet to become thinner, break easier, and heal more slowly. Your skin may become dry, and the skin may peel and crack. You may also have nerve damage in your legs and feet causing decreased feeling in them. You may not notice minor injuries to your feet that could lead to infections or more serious problems. Taking care of your feet is one of the most important things you can do for yourself.  HOME CARE INSTRUCTIONS  Wear shoes at all times, even in the house. Do not go barefoot. Bare feet are easily injured.  Check your feet daily for blisters, cuts, and redness. If you cannot see the bottom of your feet, use a mirror or ask someone for help.  Wash your feet with warm water (do not use hot water) and mild soap. Then pat your feet and the areas between your toes until they are completely dry. Do not soak your feet as this can dry your skin.  Apply a moisturizing lotion or petroleum jelly (that does not contain alcohol and is unscented) to the skin on your feet and to dry, brittle toenails. Do not apply lotion between your toes.  Trim your toenails straight across. Do not dig under them or around the cuticle. File the edges of your nails with an emery board or nail file.  Do not cut corns or calluses or try to remove them with medicine.  Wear clean socks or stockings every day. Make sure they are not too tight. Do not wear knee-high stockings since they may decrease blood flow to your legs.  Wear shoes that fit properly and have enough cushioning. To break in new shoes, wear them for just a few hours a day. This prevents you from injuring your feet. Always look in your shoes before you put them on to be sure there are no objects inside.  Do not cross your legs. This may decrease the blood flow to your feet.  If you find a minor scrape,  cut, or break in the skin on your feet, keep it and the skin around it clean and dry. These areas may be cleansed with mild soap and water. Do not cleanse the area with peroxide, alcohol, or iodine.  When you remove an adhesive bandage, be sure not to damage the skin around it.  If you have a wound, look at it several times a day to make sure it is healing.  Do not use heating pads or hot water bottles. They may burn your skin. If you have lost feeling in your feet or legs, you may not know it is happening until it is too late.  Make sure your health care provider performs a complete foot exam at least annually or more often if you have foot problems. Report any cuts, sores, or bruises to your health care provider immediately. SEEK MEDICAL CARE IF:   You have an injury that is not healing.  You have cuts or breaks in the skin.  You have an ingrown nail.  You notice redness on your legs or feet.  You feel burning or tingling in your legs or feet.  You have pain or cramps in your legs and feet.  Your legs or feet are numb.  Your feet always feel cold. SEEK IMMEDIATE MEDICAL CARE IF:   There is increasing redness,   swelling, or pain in or around a wound.  There is a red line that goes up your leg.  Pus is coming from a wound.  You develop a fever or as directed by your health care provider.  You notice a bad smell coming from an ulcer or wound. Document Released: 12/16/1999 Document Revised: 08/20/2012 Document Reviewed: 05/27/2012 [employer] Patient Information 2014 [employer].  

## 2013-05-14 NOTE — Progress Notes (Signed)
   Subjective:    Patient ID: [name], male    DOB: 1931-08-31, 80 y.o.   MRN: [id number]  HPI I just need my callus trimmed up on my left foot    Review of Systems no new systemic changes or findings noted     Objective:   Physical Exam Patient is having no problems in his right foot does not the nail care his nails that runs facet or in the past does have pedal pulses palpable DP plus one over 4 PT plus one over 4 on the left foot patient does wear a shoe with AFO brace for abnormal gait Charcot-Marie-Tooth or CMT deformities. Patient's plantar flexed metatarsal with diffuse keratoses sub-first left and a keratotic lesion this time show some hemorrhage a keratoses no open wounds ulcerations no secondary infections again profound neuropathy with dropfoot deformity abnormality gait secondary to neurologic condition to       Assessment & Plan:  Assessment this time his diabetes with peripheral neuropathy and gait abnormality plantar flexed metatarsal with poor keratoses keratotic lesion debridement presence of diabetes and complications return at future for followup and palliative foot and nail care is needed suggest a 3 month followup at this time.  Alvan Dameichard Rashmi Tallent DPM

## 2013-05-15 ENCOUNTER — Ambulatory Visit
Admission: RE | Admit: 2013-05-15 | Discharge: 2013-05-15 | Disposition: A | Discharge status: Home / Self Care | Payer: Commercial Managed Care - HMO | Source: Ambulatory Visit | Attending: Endocrinology | Admitting: Endocrinology

## 2013-05-15 DIAGNOSIS — R1011 Right upper quadrant pain: Secondary | ICD-10-CM

## 2013-05-15 MED ORDER — IOHEXOL 300 MG/ML  SOLN
100.0000 mL | Freq: Once | INTRAMUSCULAR | Status: AC | PRN
  Administered 2013-05-15: 100 mL via INTRAVENOUS

## 2013-06-01 ENCOUNTER — Ambulatory Visit (INDEPENDENT_AMBULATORY_CARE_PROVIDER_SITE_OTHER): Payer: Commercial Managed Care - HMO | Admitting: Ophthalmology

## 2013-06-01 DIAGNOSIS — I10 Essential (primary) hypertension: Secondary | ICD-10-CM

## 2013-06-01 DIAGNOSIS — E1139 Type 2 diabetes mellitus with other diabetic ophthalmic complication: Secondary | ICD-10-CM

## 2013-06-01 DIAGNOSIS — E11319 Type 2 diabetes mellitus with unspecified diabetic retinopathy without macular edema: Secondary | ICD-10-CM

## 2013-06-01 DIAGNOSIS — H35039 Hypertensive retinopathy, unspecified eye: Secondary | ICD-10-CM

## 2013-06-01 DIAGNOSIS — H43819 Vitreous degeneration, unspecified eye: Secondary | ICD-10-CM

## 2013-06-01 DIAGNOSIS — E1165 Type 2 diabetes mellitus with hyperglycemia: Secondary | ICD-10-CM

## 2013-06-01 DIAGNOSIS — H2510 Age-related nuclear cataract, unspecified eye: Secondary | ICD-10-CM

## 2013-06-05 ENCOUNTER — Other Ambulatory Visit: Payer: Self-pay | Admitting: Endocrinology

## 2013-07-22 ENCOUNTER — Ambulatory Visit (INDEPENDENT_AMBULATORY_CARE_PROVIDER_SITE_OTHER): Payer: Commercial Managed Care - HMO | Admitting: Cardiovascular Disease

## 2013-07-22 ENCOUNTER — Encounter (INDEPENDENT_AMBULATORY_CARE_PROVIDER_SITE_OTHER): Payer: Self-pay

## 2013-07-22 ENCOUNTER — Encounter: Payer: Self-pay | Admitting: Cardiovascular Disease

## 2013-07-22 VITALS — BP 120/60 | HR 66 | Ht 70.0 in | Wt 191.0 lb

## 2013-07-22 DIAGNOSIS — F17201 Nicotine dependence, unspecified, in remission: Secondary | ICD-10-CM

## 2013-07-22 DIAGNOSIS — I2510 Atherosclerotic heart disease of native coronary artery without angina pectoris: Secondary | ICD-10-CM

## 2013-07-22 DIAGNOSIS — Z87891 Personal history of nicotine dependence: Secondary | ICD-10-CM

## 2013-07-22 DIAGNOSIS — I471 Supraventricular tachycardia: Secondary | ICD-10-CM

## 2013-07-22 DIAGNOSIS — I498 Other specified cardiac arrhythmias: Secondary | ICD-10-CM

## 2013-07-22 DIAGNOSIS — I10 Essential (primary) hypertension: Secondary | ICD-10-CM

## 2013-07-22 NOTE — Patient Instructions (Signed)
Your physician wants you to follow-up in:  12 months.  You will receive a reminder letter in the mail two months in advance. If you don't receive a letter, please call our office to schedule the follow-up appointment.   

## 2013-07-22 NOTE — Progress Notes (Signed)
History of Present Illness: 80 yo male with history of CAD with 5V CABG in 1995 with most recent relook cath 2006, DM, HTN, former smoker who is here today for cardiac follow up. He had been followed in the past by Dr. Olevia Perches. I met him in April 2012 and he had c/o chest pains. Stress myoview April 2012 with no ischemia. I saw him 12/24/12 and there was question of atrial fibrillation in primary care. I arranged a 48 hour monitor which showed sinus rhythm with frequent PACs, several runs of SVT. Echo 01/12/13 with normal LV size and function, mild AI and TR. Probable mild pulm HTN. He could not lay flat for stress myoview images due to diaphragm issues, chronic problem.   He is here today for follow up. He tells me that he has been feeling well. He has stable angina with episodes of chest pain once every 3-4 weeks. Lasts for 5 minutes. He reports constant SOB secondary to his lung disease. No changes since last visit. No awareness of palpitations. No smoking.   Primary Care Physician: Loanne Drilling  Last Lipid Profile:Lipid Panel     Component Value Date/Time   CHOL 137 12/15/2012 1010   TRIG 74.0 12/15/2012 1010   HDL 51.70 12/15/2012 1010   CHOLHDL 3 12/15/2012 1010   VLDL 14.8 12/15/2012 1010   LDLCALC 71 12/15/2012 1010     Past Medical History  Diagnosis Date  . Coronary artery disease   . Leukopenia   . Smoker     quit 1968--25 ppy  . H/O: asbestos exposure   . Pernicious anemia   . ANEMIA, IRON DEFICIENCY   . COPD (chronic obstructive pulmonary disease)   . BACK PAIN, LUMBAR   . Hypertension   . GERD (gastroesophageal reflux disease)   . DIABETES MELLITUS, TYPE II   . Charcot-Marie-Tooth disease     assoiciated muscular dystrophy     Past Surgical History  Procedure Laterality Date  . Coronary artery bypass graft  1995    Current Outpatient Prescriptions  Medication Sig Dispense Refill  . Ascorbic Acid (VITAMIN C) 500 MG tablet Take 500 mg by mouth 2 (two) times  daily.       Marland Kitchen aspirin 325 MG tablet Take 325 mg by mouth daily.        . clotrimazole-betamethasone (LOTRISONE) cream Apply topically 2 (two) times daily as needed.       . Cyanocobalamin (VITAMIN B-12 IJ) Inject as directed. GET A B12 INJECTION ONCE EVERY MONTH      . cyanocobalamin 1000 MCG tablet Take 500 mcg by mouth 2 (two) times daily.       Marland Kitchen dexlansoprazole (KAPIDEX) 60 MG capsule Take 1 capsule (60 mg total) by mouth daily.  90 capsule  3  . ferrous sulfate (FERRO-BOB) 325 (65 FE) MG tablet Take 325 mg by mouth 2 (two) times daily.       . Inositol Niacinate (NIACIN FLUSH FREE) 500 MG CAPS Take by mouth.      . metFORMIN (GLUCOPHAGE) 1000 MG tablet Take 1 tablet (1,000 mg total) by mouth 2 (two) times daily with a meal.  180 tablet  1  . montelukast (SINGULAIR) 10 MG tablet Take 1 tablet (10 mg total) by mouth at bedtime.  90 tablet  1  . Multiple Vitamins-Minerals (MULTIVITAMIN WITH MINERALS) tablet Take 1 tablet by mouth daily.        . niacin 500 MG tablet Take 1 tablet (500 mg total) by  mouth daily with breakfast.  90 tablet  0  . sertraline (ZOLOFT) 50 MG tablet TAKE 1 TABLET EVERY DAY  90 tablet  0  . simvastatin (ZOCOR) 80 MG tablet Take 1 tablet (80 mg total) by mouth at bedtime.  90 tablet  1  . traMADol (ULTRAM) 50 MG tablet Take 1 tablet (50 mg total) by mouth every 6 (six) hours as needed.  360 tablet  1  . Zinc 50 MG CAPS Take 1 capsule by mouth.        . gabapentin (NEURONTIN) 300 MG capsule 1 or 2 pills by mouth at night as needed for leg pain      . HYDROcodone-acetaminophen (NORCO) 10-325 MG per tablet Take 1 tablet by mouth every 6 (six) hours as needed for pain.  30 tablet  0  . nitroGLYCERIN (NITROSTAT) 0.4 MG SL tablet Place 0.4 mg under the tongue every 5 (five) minutes as needed.       No current facility-administered medications for this visit.    No Known Allergies  History   Social History  . Marital Status: Widowed    Spouse Name: N/A    Number of  Children: N/A  . Years of Education: N/A   Occupational History  . Retired     Engineer, building services   Social History Main Topics  . Smoking status: Former Smoker -- 1.00 packs/day    Types: Cigarettes    Quit date: 01/01/1966  . Smokeless tobacco: Never Used  . Alcohol Use: No  . Drug Use: No  . Sexual Activity: Not on file   Other Topics Concern  . Not on file   Social History Narrative   Was in service during [place] War          Family History  Problem Relation Age of Onset  . Charcot-Marie-Tooth disease Sister   . Charcot-Marie-Tooth disease Brother   . Charcot-Marie-Tooth disease Brother   . Charcot-Marie-Tooth disease Brother   . Charcot-Marie-Tooth disease Sister   . Charcot-Marie-Tooth disease Son   . Charcot-Marie-Tooth disease Other     Review of Systems:  As stated in the HPI and otherwise negative.   BP 120/60  Pulse 66  Ht $R'5\' 10"'JM$  (1.778 m)  Wt 191 lb (86.637 kg)  BMI 27.41 kg/m2  Physical Examination: General: Well developed, well nourished, NAD HEENT: OP clear, mucus membranes moist SKIN: warm, dry. No rashes. Neuro: No focal deficits Musculoskeletal: Muscle strength 5/5 all ext Psychiatric: Mood and affect normal Neck: No JVD, no carotid bruits, no thyromegaly, no lymphadenopathy. Lungs:Clear bilaterally, no wheezes, rhonci, crackles Cardiovascular: Regular rate and rhythm. No murmurs, gallops or rubs. Abdomen:Soft. Bowel sounds present. Non-tender.  Extremities: No lower extremity edema. Pulses are 2 + in the bilateral DP/PT.  Echo 01/12/13: Left ventricle: The cavity size was normal. There was mild focal basal hypertrophy of the septum. Systolic function was normal. The estimated ejection fraction was in the range of 55% to 60%. Wall motion was normal; there were no regional wall motion abnormalities. - Aortic valve: Mild regurgitation. - Left atrium: The atrium was mildly dilated. - Right ventricle: The cavity size was mildly dilated.  Wall thickness was normal. - Tricuspid valve: Mild-moderate regurgitation. - Pulmonary arteries: PA peak pressure: 29mm Hg (S). Impressions:  - The right ventricular systolic pressure was increased consistent with mild pulmonary hypertension.  EKG: NSR, rate 66 bpm. Non-specific ST and T wave abnormality.   Assessment and Plan:   1. CAD: he is s/p CABG  in 1995. He has stable angina. He could not lay flat for the stress test images. This is a chronic issue due to his lung/diaphragm issues. Continue ASA and statin. No beta blocker with bradycardia.   2. SVT: He is noted to have several episodes of SVT on monitor. He also has junctional bradycardia with lowest rate 41 bpm at night while sleeping. I have discussed this with pt and family. Will not start a rate control agent at this time since he feels well and I do not want to drop his heart rate any further. If he has symptoms from SVT, will need EP referral as a pacemaker would likely be needed for rate backup if medical therapy is pursued to rate control.   3. Tobacco abuse, in remission: He has not started back smoking.   4. HTN: BP controlled. No changes.

## 2013-08-13 ENCOUNTER — Ambulatory Visit (INDEPENDENT_AMBULATORY_CARE_PROVIDER_SITE_OTHER): Payer: Commercial Managed Care - HMO

## 2013-08-13 DIAGNOSIS — B351 Tinea unguium: Secondary | ICD-10-CM

## 2013-08-13 DIAGNOSIS — M79609 Pain in unspecified limb: Secondary | ICD-10-CM

## 2013-08-13 DIAGNOSIS — Q828 Other specified congenital malformations of skin: Secondary | ICD-10-CM

## 2013-08-13 DIAGNOSIS — E1149 Type 2 diabetes mellitus with other diabetic neurological complication: Secondary | ICD-10-CM

## 2013-08-13 DIAGNOSIS — M79675 Pain in left toe(s): Secondary | ICD-10-CM

## 2013-08-13 DIAGNOSIS — E1140 Type 2 diabetes mellitus with diabetic neuropathy, unspecified: Secondary | ICD-10-CM

## 2013-08-13 NOTE — Patient Instructions (Signed)
Diabetes and Foot Care Diabetes may cause you to have problems because of poor blood supply (circulation) to your feet and legs. This may cause the skin on your feet to become thinner, break easier, and heal more slowly. Your skin may become dry, and the skin may peel and crack. You may also have nerve damage in your legs and feet causing decreased feeling in them. You may not notice minor injuries to your feet that could lead to infections or more serious problems. Taking care of your feet is one of the most important things you can do for yourself.  HOME CARE INSTRUCTIONS  Wear shoes at all times, even in the house. Do not go barefoot. Bare feet are easily injured.  Check your feet daily for blisters, cuts, and redness. If you cannot see the bottom of your feet, use a mirror or ask someone for help.  Wash your feet with warm water (do not use hot water) and mild soap. Then pat your feet and the areas between your toes until they are completely dry. Do not soak your feet as this can dry your skin.  Apply a moisturizing lotion or petroleum jelly (that does not contain alcohol and is unscented) to the skin on your feet and to dry, brittle toenails. Do not apply lotion between your toes.  Trim your toenails straight across. Do not dig under them or around the cuticle. File the edges of your nails with an emery board or nail file.  Do not cut corns or calluses or try to remove them with medicine.  Wear clean socks or stockings every day. Make sure they are not too tight. Do not wear knee-high stockings since they may decrease blood flow to your legs.  Wear shoes that fit properly and have enough cushioning. To break in new shoes, wear them for just a few hours a day. This prevents you from injuring your feet. Always look in your shoes before you put them on to be sure there are no objects inside.  Do not cross your legs. This may decrease the blood flow to your feet.  If you find a minor scrape,  cut, or break in the skin on your feet, keep it and the skin around it clean and dry. These areas may be cleansed with mild soap and water. Do not cleanse the area with peroxide, alcohol, or iodine.  When you remove an adhesive bandage, be sure not to damage the skin around it.  If you have a wound, look at it several times a day to make sure it is healing.  Do not use heating pads or hot water bottles. They may burn your skin. If you have lost feeling in your feet or legs, you may not know it is happening until it is too late.  Make sure your health care provider performs a complete foot exam at least annually or more often if you have foot problems. Report any cuts, sores, or bruises to your health care provider immediately. SEEK MEDICAL CARE IF:   You have an injury that is not healing.  You have cuts or breaks in the skin.  You have an ingrown nail.  You notice redness on your legs or feet.  You feel burning or tingling in your legs or feet.  You have pain or cramps in your legs and feet.  Your legs or feet are numb.  Your feet always feel cold. SEEK IMMEDIATE MEDICAL CARE IF:   There is increasing redness,   swelling, or pain in or around a wound.  There is a red line that goes up your leg.  Pus is coming from a wound.  You develop a fever or as directed by your health care provider.  You notice a bad smell coming from an ulcer or wound. Document Released: 12/16/1999 Document Revised: 08/20/2012 Document Reviewed: 05/27/2012 Exit[employer] Patient Information 2015 [employer]. This information is not intended to replace advice given to you by your health care provider. Make sure you discuss any questions you have with your health care provider.  

## 2013-08-13 NOTE — Progress Notes (Signed)
   Subjective:    Patient ID: [name], male    DOB: 25-Nov-1931, 80 y.o.   MRN: [id number]  HPI I WOULD LIKE TO HAVE MY TOENAILS TRIMMED UP    Review of Systems no new findings or systemic changes noted     Objective:   Physical Exam Lower extremity objective findings as follows patient declined have the right examined his nails and I need to be trimmed no open sores or lesions on that is wearing an diabetic shoe with AFO type brace. The left foot is examined at this time with pedal pulses palpable DP and PT plus one over 4 on the left capillary refill time 3 seconds to 4 seconds all digits decreased epicritic sensation and loss of muscle strength. Patient walks with a diabetic shoe with AFO double upright brace on the left foot as well. There is keratoses with hemorrhage a keratoses sub-first MTP area left which is debrided slight pinpoint history with lumicain and Neosporin and Band-Aid dressing. Nails 1 through 5 left foot are debrided this time thick brittle crumbly dystrophic nails with discoloration and friability tenderness on palpation this time nails debrided 1 through 5 left foot lumicain and Neosporin applied to the third and first following debridement. Patient will maintain AFO brace to diabetic shoes as instructed       Assessment & Plan:  Assessment this time diabetes with history peripheral neuropathy and angiopathy still keratotic lesion debridement sub-1 left with hemorrhage a keratoses or pre-ulcer also this time debridement multiple painful mycotic nails 1 through 5 left foot return for future palliative foot and nail care in 3 months as recommended next  Alvan Dameichard Letetia Romanello DPM

## 2013-09-15 ENCOUNTER — Other Ambulatory Visit: Payer: Self-pay | Admitting: Endocrinology

## 2013-09-23 ENCOUNTER — Telehealth: Payer: Self-pay

## 2013-09-23 MED ORDER — TRAMADOL HCL 50 MG PO TABS: 50.0000 mg | ORAL_TABLET | Freq: Four times a day (QID) | ORAL | Status: DC | PRN

## 2013-09-23 NOTE — Telephone Encounter (Signed)
i printed 

## 2013-09-23 NOTE — Telephone Encounter (Signed)
Rx faxed to pharmacy  

## 2013-09-23 NOTE — Telephone Encounter (Signed)
Received a refill request for Tramadol 50 mg. Pt was last seen on 05/04/2013 and medication was last refilled on 02/27/2013.  Thanks!

## 2013-11-11 ENCOUNTER — Ambulatory Visit: Payer: Commercial Managed Care - HMO

## 2013-11-13 ENCOUNTER — Ambulatory Visit (INDEPENDENT_AMBULATORY_CARE_PROVIDER_SITE_OTHER): Payer: Commercial Managed Care - HMO

## 2013-11-13 VITALS — BP 136/84 | HR 74 | Resp 18

## 2013-11-13 DIAGNOSIS — E1140 Type 2 diabetes mellitus with diabetic neuropathy, unspecified: Secondary | ICD-10-CM

## 2013-11-13 DIAGNOSIS — Q828 Other specified congenital malformations of skin: Secondary | ICD-10-CM

## 2013-11-13 DIAGNOSIS — M79675 Pain in left toe(s): Secondary | ICD-10-CM

## 2013-11-13 DIAGNOSIS — B351 Tinea unguium: Secondary | ICD-10-CM

## 2013-11-13 NOTE — Patient Instructions (Signed)
Diabetes and Foot Care Diabetes may cause you to have problems because of poor blood supply (circulation) to your feet and legs. This may cause the skin on your feet to become thinner, break easier, and heal more slowly. Your skin may become dry, and the skin may peel and crack. You may also have nerve damage in your legs and feet causing decreased feeling in them. You may not notice minor injuries to your feet that could lead to infections or more serious problems. Taking care of your feet is one of the most important things you can do for yourself.  HOME CARE INSTRUCTIONS  Wear shoes at all times, even in the house. Do not go barefoot. Bare feet are easily injured.  Check your feet daily for blisters, cuts, and redness. If you cannot see the bottom of your feet, use a mirror or ask someone for help.  Wash your feet with warm water (do not use hot water) and mild soap. Then pat your feet and the areas between your toes until they are completely dry. Do not soak your feet as this can dry your skin.  Apply a moisturizing lotion or petroleum jelly (that does not contain alcohol and is unscented) to the skin on your feet and to dry, brittle toenails. Do not apply lotion between your toes.  Trim your toenails straight across. Do not dig under them or around the cuticle. File the edges of your nails with an emery board or nail file.  Do not cut corns or calluses or try to remove them with medicine.  Wear clean socks or stockings every day. Make sure they are not too tight. Do not wear knee-high stockings since they may decrease blood flow to your legs.  Wear shoes that fit properly and have enough cushioning. To break in new shoes, wear them for just a few hours a day. This prevents you from injuring your feet. Always look in your shoes before you put them on to be sure there are no objects inside.  Do not cross your legs. This may decrease the blood flow to your feet.  If you find a minor scrape,  cut, or break in the skin on your feet, keep it and the skin around it clean and dry. These areas may be cleansed with mild soap and water. Do not cleanse the area with peroxide, alcohol, or iodine.  When you remove an adhesive bandage, be sure not to damage the skin around it.  If you have a wound, look at it several times a day to make sure it is healing.  Do not use heating pads or hot water bottles. They may burn your skin. If you have lost feeling in your feet or legs, you may not know it is happening until it is too late.  Make sure your health care provider performs a complete foot exam at least annually or more often if you have foot problems. Report any cuts, sores, or bruises to your health care provider immediately. SEEK MEDICAL CARE IF:   You have an injury that is not healing.  You have cuts or breaks in the skin.  You have an ingrown nail.  You notice redness on your legs or feet.  You feel burning or tingling in your legs or feet.  You have pain or cramps in your legs and feet.  Your legs or feet are numb.  Your feet always feel cold. SEEK IMMEDIATE MEDICAL CARE IF:   There is increasing redness,   swelling, or pain in or around a wound.  There is a red line that goes up your leg.  Pus is coming from a wound.  You develop a fever or as directed by your health care provider.  You notice a bad smell coming from an ulcer or wound. Document Released: 12/16/1999 Document Revised: 08/20/2012 Document Reviewed: 05/27/2012 Exit[employer] Patient Information 2015 [employer]. This information is not intended to replace advice given to you by your health care provider. Make sure you discuss any questions you have with your health care provider.  

## 2013-11-13 NOTE — Progress Notes (Signed)
Subjective:     Patient ID: [name], male   DOB: 12-May-1931, 80 y.o.   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 Plantigrade first metatarsal left with history of previous ulceration currently stable patient has a foldable. Brace off foot which she continues to wear a hemorrhage a keratoses sub-first left is debrided down to the dermal subdermal junction slight pinpoint bleed is identified treated with limited cane Silvadene and a gauze wrap maintain Neosporin and Band-Aid to the pre-also keratoses sub-first left for the next several days. Nails thick brittle crumbly friable dystrophic 1 through 5 bilateral has had partial excisions of hallux nails although continues to have some regrowth of spicules and crumbly dystrophic brittle nail debridement tissue which is debrided 10 lumicainand Neosporin applied to the second and third right and first left.no other new changes are noted at this time no open wounds or active bleeding surgeon identified. Continues to have gait abnormality and weakness.  Assessment:     Assessment diabetes with history peripheral neuropathy and angiopathy as well as Charcot-Marie-Tooth with gait abnormality.     Plan:     Plan at this time debridement of thick brittle crumbly friable mycotic nails 1 through 5 bilateral nails 2 and 3 right and first left are  treated with limited cane and Neosporin also this time debridement of hemorrhage a keratotic lesion or pre-ulcer sub-first left down to the dermal junction also dressed with anabolic ointment and a gauze dressing reappointed in 2-3 months for follow-up advised to contact us visiting changes or exacerbations in the interim.  Alvan Dameichard Jeffifer Rabold DPM

## 2013-12-16 ENCOUNTER — Ambulatory Visit (INDEPENDENT_AMBULATORY_CARE_PROVIDER_SITE_OTHER): Payer: Commercial Managed Care - HMO | Admitting: Endocrinology

## 2013-12-16 ENCOUNTER — Encounter: Payer: Self-pay | Admitting: Endocrinology

## 2013-12-16 ENCOUNTER — Other Ambulatory Visit: Payer: Self-pay

## 2013-12-16 VITALS — BP 126/64 | HR 69 | Temp 98.3°F | Ht 70.0 in | Wt 190.0 lb

## 2013-12-16 DIAGNOSIS — Z0000 Encounter for general adult medical examination without abnormal findings: Secondary | ICD-10-CM | POA: Diagnosis not present

## 2013-12-16 DIAGNOSIS — Z23 Encounter for immunization: Secondary | ICD-10-CM

## 2013-12-16 DIAGNOSIS — Z125 Encounter for screening for malignant neoplasm of prostate: Secondary | ICD-10-CM

## 2013-12-16 DIAGNOSIS — D509 Iron deficiency anemia, unspecified: Secondary | ICD-10-CM

## 2013-12-16 DIAGNOSIS — E1142 Type 2 diabetes mellitus with diabetic polyneuropathy: Secondary | ICD-10-CM | POA: Diagnosis not present

## 2013-12-16 DIAGNOSIS — G600 Hereditary motor and sensory neuropathy: Secondary | ICD-10-CM

## 2013-12-16 MED ORDER — CYANOCOBALAMIN 1000 MCG/ML IJ SOLN
1000.0000 ug | Freq: Once | INTRAMUSCULAR | Status: AC
  Administered 2013-12-16: 1000 ug via INTRAMUSCULAR

## 2013-12-16 NOTE — Progress Notes (Signed)
Subjective:    Patient ID: [name], male    DOB: 04-15-1931, 80 y.o.   MRN: [id number]  HPI Pt is here for regular wellness examination, and is feeling pretty well in general, and says chronic med probs are stable, except as noted below.   Past Medical History  Diagnosis Date  . Coronary artery disease   . Leukopenia   . Smoker     quit 1968--25 ppy  . H/O: asbestos exposure   . Pernicious anemia   . ANEMIA, IRON DEFICIENCY   . COPD (chronic obstructive pulmonary disease)   . BACK PAIN, LUMBAR   . Hypertension   . GERD (gastroesophageal reflux disease)   . DIABETES MELLITUS, TYPE II   . Charcot-Marie-Tooth disease     assoiciated muscular dystrophy     Past Surgical History  Procedure Laterality Date  . Coronary artery bypass graft  1995    History   Social History  . Marital Status: Widowed    Spouse Name: N/A    Number of Children: N/A  . Years of Education: N/A   Occupational History  . Retired     Games developerdiesel mechanic   Social History Main Topics  . Smoking status: Former Smoker -- 1.00 packs/day    Types: Cigarettes    Quit date: 01/01/1966  . Smokeless tobacco: Never Used  . Alcohol Use: No  . Drug Use: No  . Sexual Activity: Not on file   Other Topics Concern  . Not on file   Social History Narrative   Was in service during [place]          Current Outpatient Prescriptions on File Prior to Visit  Medication Sig Dispense Refill  . Ascorbic Acid (VITAMIN C) 500 MG tablet Take 500 mg by mouth 2 (two) times daily.     Marland Kitchen. aspirin 325 MG tablet Take 325 mg by mouth daily.      . Blood Glucose Calibration (ADVOCATE REDI-CODE+ CONTROL) LOW SOLN   3  . Blood Glucose Monitoring Suppl (ADVOCATE REDI-CODE+) DEVI   1  . CLEVER CHEK AUTO-CODE VOICE test strip     . clotrimazole-betamethasone (LOTRISONE) cream Apply topically 2 (two) times daily as needed.     . Cyanocobalamin (VITAMIN B-12 IJ) Inject as directed. GET A B12 INJECTION ONCE EVERY MONTH     . cyanocobalamin 1000 MCG tablet Take 500 mcg by mouth 2 (two) times daily.     Marland Kitchen. dexlansoprazole (KAPIDEX) 60 MG capsule Take 1 capsule (60 mg total) by mouth daily. 90 capsule 3  . ferrous sulfate (FERRO-BOB) 325 (65 FE) MG tablet Take 325 mg by mouth 2 (two) times daily.     Marland Kitchen. gabapentin (NEURONTIN) 300 MG capsule 1 or 2 pills by mouth at night as needed for leg pain    . Inositol Niacinate (NIACIN FLUSH FREE) 500 MG CAPS Take by mouth.    Demetra Shiner. Lancet Devices (SIMPLE DIAGNOSTICS LANCING DEV) MISC   1  . metFORMIN (GLUCOPHAGE) 1000 MG tablet TAKE 1 TABLET TWICE DAILY WITH MEALS 180 tablet 1  . montelukast (SINGULAIR) 10 MG tablet TAKE 1 TABLET AT BEDTIME 90 tablet 1  . Multiple Vitamins-Minerals (MULTIVITAMIN WITH MINERALS) tablet Take 1 tablet by mouth daily.      . niacin 500 MG tablet Take 1 tablet (500 mg total) by mouth daily with breakfast. 90 tablet 0  . PHARMACIST CHOICE LANCETS MISC   99  . sertraline (ZOLOFT) 50 MG tablet TAKE 1 TABLET  EVERY DAY 90 tablet 0  . simvastatin (ZOCOR) 80 MG tablet TAKE 1 TABLET AT BEDTIME 90 tablet 1  . traMADol (ULTRAM) 50 MG tablet Take 1 tablet (50 mg total) by mouth every 6 (six) hours as needed. 360 tablet 1  . Zinc 50 MG CAPS Take 1 capsule by mouth.      . nitroGLYCERIN (NITROSTAT) 0.4 MG SL tablet Place 0.4 mg under the tongue every 5 (five) minutes as needed.     No current facility-administered medications on file prior to visit.    No Known Allergies  Family History  Problem Relation Age of Onset  . Charcot-Marie-Tooth disease Sister   . Charcot-Marie-Tooth disease Brother   . Charcot-Marie-Tooth disease Brother   . Charcot-Marie-Tooth disease Brother   . Charcot-Marie-Tooth disease Sister   . Charcot-Marie-Tooth disease Son   . Charcot-Marie-Tooth disease Other     BP 126/64 mmHg  Pulse 69  Temp(Src) 98.3 F (36.8 C) (Oral)  Ht 5\' 10"  (1.778 m)  Wt 190 lb (86.183 kg)  BMI 27.26 kg/m2  SpO2 97%  Review of Systems    Constitutional: Negative for fever and unexpected weight change.  HENT: Negative for hearing loss.   Eyes: Negative for visual disturbance.  Respiratory: Negative for shortness of breath.   Cardiovascular: Negative for chest pain.  Gastrointestinal: Negative for anal bleeding.  Endocrine: Negative for cold intolerance.  Genitourinary: Negative for hematuria.  Musculoskeletal: Negative for neck pain.  Skin: Negative for rash.  Allergic/Immunologic: Positive for environmental allergies.  Neurological: Negative for syncope.  Hematological: Does not bruise/bleed easily.  Psychiatric/Behavioral: Negative for dysphoric mood.       Objective:   Physical Exam VS: see vs page GEN: no distress HEAD: head: no deformity eyes: no periorbital swelling, no proptosis external nose and ears are normal mouth: no lesion seen NECK: supple, thyroid is not enlarged CHEST WALL: no deformity LUNGS: clear to auscultation BREASTS:  No gynecomastia CV: reg rate and rhythm, no murmur ABD: abdomen is soft, nontender.  no hepatosplenomegaly.  not distended.  no hernia RECTAL/PROSTATE: declined.   MUSCULOSKELETAL: muscle bulk and strength are grossly normal throughout the UE's.  no obvious joint swelling.  gait is slow but steady EXTEMITIES: there are braces on the legs (chronic). PULSES: no carotid bruit NEURO:  cn 2-12 grossly intact.   readily moves all 4's.   SKIN:  Normal texture and temperature.  No rash or suspicious lesion is visible.   NODES:  None palpable at the neck.   PSYCH: alert, well-oriented.  Does not appear anxious nor depressed.       Assessment & Plan:  Wellness visit today, with problems stable, except as noted.    SEPARATE EVALUATION FOLLOWS--EACH PROBLEM HERE IS NEW, NOT RESPONDING TO TREATMENT, OR POSES SIGNIFICANT RISK TO THE PATIENT'S HEALTH: HISTORY OF THE PRESENT ILLNESS: Pt states 1 year of moderate worsening in his chronic bilat leg weakness, and assoc numbness.    PAST MEDICAL HISTORY reviewed and up to date today.   REVIEW OF SYSTEMS: Denies headache PHYSICAL EXAMINATION: VITAL SIGNS:  See vs page. GENERAL: no distress.   gait is slow but steady Muscle strength is subjectively decreased from normal throughout the LE's IMPRESSION: Leg weakness, chronic, worse, prob due to progressive CMT PLAN: See instruction page.

## 2013-12-16 NOTE — Progress Notes (Signed)
we discussed code status.  pt requests full code, but would not want to be started or maintained on artificial life-support measures if there was not a reasonable chance of recovery 

## 2013-12-16 NOTE — Patient Instructions (Addendum)
please consider these measures for your health:  minimize alcohol.  do not use tobacco products.  have a colonoscopy at least every 10 years from age 80.  keep firearms safely stored.  always use seat belts.  have working smoke alarms in your home.  see an eye doctor and dentist regularly.  never drive under the influence of alcohol or drugs (including prescription drugs).  those with fair skin should take precautions against the sun.   it is critically important to prevent falling down (keep floor areas well-lit, dry, and free of loose objects.  If you have a cane, walker, or wheelchair, you should use it, even for short trips around the house.  Also, try not to rush).   blood tests are being requested for you today.  We'll let you know about the results.   Please see a specialist about the leg symptoms.  you will receive a phone call, about a day and time for an appointment.   Please come back for a follow-up appointment in 6 months.

## 2013-12-17 ENCOUNTER — Other Ambulatory Visit: Payer: Self-pay

## 2013-12-17 ENCOUNTER — Telehealth: Payer: Self-pay

## 2013-12-17 LAB — URINALYSIS, ROUTINE W REFLEX MICROSCOPIC
BILIRUBIN URINE: NEGATIVE
Hgb urine dipstick: NEGATIVE
KETONES UR: NEGATIVE
Leukocytes, UA: NEGATIVE
Nitrite: NEGATIVE
PH: 8 (ref 5.0–8.0)
RBC / HPF: NONE SEEN (ref 0–?)
SPECIFIC GRAVITY, URINE: 1.015 (ref 1.000–1.030)
Total Protein, Urine: NEGATIVE
URINE GLUCOSE: NEGATIVE
Urobilinogen, UA: 0.2 (ref 0.0–1.0)
WBC, UA: NONE SEEN (ref 0–?)

## 2013-12-17 LAB — CBC WITH DIFFERENTIAL/PLATELET
BASOS PCT: 0.3 % (ref 0.0–3.0)
Basophils Absolute: 0 10*3/uL (ref 0.0–0.1)
Eosinophils Absolute: 0.1 10*3/uL (ref 0.0–0.7)
Eosinophils Relative: 2.4 % (ref 0.0–5.0)
HEMATOCRIT: 35.2 % — AB (ref 39.0–52.0)
Hemoglobin: 11.5 g/dL — ABNORMAL LOW (ref 13.0–17.0)
LYMPHS ABS: 1.1 10*3/uL (ref 0.7–4.0)
LYMPHS PCT: 25.2 % (ref 12.0–46.0)
MCHC: 32.6 g/dL (ref 30.0–36.0)
MCV: 91.7 fl (ref 78.0–100.0)
MONOS PCT: 13.7 % — AB (ref 3.0–12.0)
Monocytes Absolute: 0.6 10*3/uL (ref 0.1–1.0)
NEUTROS ABS: 2.5 10*3/uL (ref 1.4–7.7)
Neutrophils Relative %: 58.4 % (ref 43.0–77.0)
Platelets: 167 10*3/uL (ref 150.0–400.0)
RBC: 3.84 Mil/uL — AB (ref 4.22–5.81)
RDW: 13.4 % (ref 11.5–15.5)
WBC: 4.3 10*3/uL (ref 4.0–10.5)

## 2013-12-17 LAB — BASIC METABOLIC PANEL
BUN: 19 mg/dL (ref 6–23)
CALCIUM: 9.2 mg/dL (ref 8.4–10.5)
CO2: 26 mEq/L (ref 19–32)
Chloride: 103 mEq/L (ref 96–112)
Creatinine, Ser: 0.6 mg/dL (ref 0.4–1.5)
GFR: 137.76 mL/min (ref >60.00)
GLUCOSE: 90 mg/dL (ref 70–99)
Potassium: 4.9 mEq/L (ref 3.5–5.1)
Sodium: 138 mEq/L (ref 135–145)

## 2013-12-17 LAB — MICROALBUMIN / CREATININE URINE RATIO
Creatinine,U: 46.9 mg/dL
Microalb Creat Ratio: 2.6 mg/g (ref 0.0–30.0)
Microalb, Ur: 1.2 mg/dL (ref 0.0–1.9)

## 2013-12-17 LAB — TSH: TSH: 1.67 u[IU]/mL (ref 0.35–4.50)

## 2013-12-17 LAB — HEPATIC FUNCTION PANEL
ALT: 19 U/L (ref 0–53)
AST: 23 U/L (ref 0–37)
Albumin: 4.3 g/dL (ref 3.5–5.2)
Alkaline Phosphatase: 42 U/L (ref 39–117)
BILIRUBIN DIRECT: 0.1 mg/dL (ref 0.0–0.3)
Total Bilirubin: 0.5 mg/dL (ref 0.2–1.2)
Total Protein: 6.3 g/dL (ref 6.0–8.3)

## 2013-12-17 LAB — LIPID PANEL
CHOL/HDL RATIO: 3
Cholesterol: 127 mg/dL (ref 0–200)
HDL: 45.9 mg/dL (ref >39.00)
LDL Cholesterol: 64 mg/dL (ref 0–99)
NONHDL: 81.1
Triglycerides: 86 mg/dL (ref 0.0–149.0)
VLDL: 17.2 mg/dL (ref 0.0–40.0)

## 2013-12-17 LAB — HEMOGLOBIN A1C: Hgb A1c MFr Bld: 6.7 % — ABNORMAL HIGH (ref 4.6–6.5)

## 2013-12-17 LAB — IBC PANEL
Iron: 50 ug/dL (ref 42–165)
SATURATION RATIOS: 16.9 % — AB (ref 20.0–50.0)
TRANSFERRIN: 210.8 mg/dL — AB (ref 212.0–360.0)

## 2013-12-17 LAB — PSA: PSA: 1.5 ng/mL (ref 0.10–4.00)

## 2013-12-17 MED ORDER — SERTRALINE HCL 50 MG PO TABS: 50.0000 mg | ORAL_TABLET | Freq: Every day | ORAL | Status: DC

## 2013-12-17 MED ORDER — MONTELUKAST SODIUM 10 MG PO TABS: 10.0000 mg | ORAL_TABLET | Freq: Every day | ORAL | Status: DC

## 2013-12-17 MED ORDER — SIMVASTATIN 80 MG PO TABS: 80.0000 mg | ORAL_TABLET | Freq: Every day | ORAL | Status: DC

## 2013-12-17 MED ORDER — TRAMADOL HCL 50 MG PO TABS: 50.0000 mg | ORAL_TABLET | Freq: Four times a day (QID) | ORAL | Status: DC | PRN

## 2013-12-17 MED ORDER — METFORMIN HCL 1000 MG PO TABS: 1000.0000 mg | ORAL_TABLET | Freq: Two times a day (BID) | ORAL | Status: DC

## 2013-12-17 NOTE — Telephone Encounter (Signed)
Pt would like for his Tramadol and Zoloft to be refilled for a years supply.  Ok to refill? Thanks!

## 2013-12-17 NOTE — Telephone Encounter (Signed)
Rx faxed for Tramadol.

## 2013-12-17 NOTE — Telephone Encounter (Signed)
i printed tramadol, and i sent rx for zoloft.

## 2013-12-21 ENCOUNTER — Telehealth: Payer: Self-pay | Admitting: Endocrinology

## 2013-12-21 NOTE — Telephone Encounter (Signed)
Patient is returning your call.  

## 2013-12-21 NOTE — Telephone Encounter (Signed)
Pt advised of recent labs. Iron was lon pt advised to take a daily iron supplement per MD. Pt voiced understanding.

## 2014-01-18 ENCOUNTER — Ambulatory Visit (INDEPENDENT_AMBULATORY_CARE_PROVIDER_SITE_OTHER): Payer: Commercial Managed Care - HMO

## 2014-01-18 VITALS — BP 102/62 | HR 86 | Resp 12

## 2014-01-18 DIAGNOSIS — Q828 Other specified congenital malformations of skin: Secondary | ICD-10-CM

## 2014-01-18 DIAGNOSIS — M79675 Pain in left toe(s): Secondary | ICD-10-CM

## 2014-01-18 DIAGNOSIS — E1140 Type 2 diabetes mellitus with diabetic neuropathy, unspecified: Secondary | ICD-10-CM

## 2014-01-18 NOTE — Progress Notes (Signed)
   Subjective:    Patient ID: Lillia PaulsFrank J Lemieux, male    DOB: 10-Sep-1931, 81 y.o.   MRN: [id number]  HPI  CALLUS TRIM.  Review of Systems no new changes or systemic findings noted     Objective:   Physical Exam neurovascular status unchanged DP and PT plus one over 4 Refill time 3 seconds patient does have gait abnormality with CMT. His AFO braces bilateral. Continues to have keratoses secondary plantarflexed first metatarsal sub-first MTP area left shows hemorrhagic keratoses no active ulcer no open wounds no secondary infection no discharge or drainage noted. Does have decreased sensation confirmed on [facility]. Also slight laceration left shin which Apparently earlier today may have bumped against something Neosporin and Band-Aid is applied to the left shin half centimeter ulceration or laceration.      Assessment & Plan:  Assessment this time diabetes and complications nails that she doing well do not require debridement at this time however does have keratotic lesion which is pre-ulcerative hemorrhagic required debridement down to dermal level of the orthoses is debrided this time cannot wait longer than 2 months without breakdown or ulceration recheck again in additional 2-3 months for diabetic foot palliative nail care likely nails and keratoses at that time. Next  Alvan Dameichard Shulamit Donofrio DPM

## 2014-02-09 ENCOUNTER — Encounter: Payer: Self-pay | Admitting: Endocrinology

## 2014-02-09 ENCOUNTER — Ambulatory Visit (INDEPENDENT_AMBULATORY_CARE_PROVIDER_SITE_OTHER): Payer: Commercial Managed Care - HMO | Admitting: Endocrinology

## 2014-02-09 DIAGNOSIS — Z23 Encounter for immunization: Secondary | ICD-10-CM

## 2014-02-09 DIAGNOSIS — D510 Vitamin B12 deficiency anemia due to intrinsic factor deficiency: Secondary | ICD-10-CM | POA: Diagnosis not present

## 2014-02-09 MED ORDER — CYANOCOBALAMIN 1000 MCG/ML IJ SOLN
1000.0000 ug | Freq: Once | INTRAMUSCULAR | Status: AC
  Administered 2014-02-09: 1000 ug via INTRAMUSCULAR

## 2014-02-09 MED ORDER — CEPHALEXIN 500 MG PO CAPS: 500.0000 mg | ORAL_CAPSULE | Freq: Three times a day (TID) | ORAL | Status: DC

## 2014-02-09 MED ORDER — FLUTICASONE PROPIONATE 50 MCG/ACT NA SUSP: 2.0000 | Freq: Every day | NASAL | Status: DC

## 2014-02-09 NOTE — Progress Notes (Signed)
we discussed code status.  pt requests DNR 

## 2014-02-09 NOTE — Patient Instructions (Addendum)
i have sent 2 prescriptions to your pharmacy: for an antibiotic pill, and for a steroid nasal spray. Loratadine-d (non-prescription) will help your congestion. I hope you feel better soon.  If either of these problems are not better by next week, please call back, so i can request a specialist for you.  Please call sooner if you get worse. please consider these measures for your health:  minimize alcohol.  do not use tobacco products.  have a colonoscopy at least every 10 years from age 81.  keep firearms safely stored.  always use seat belts.  have working smoke alarms in your home.  see an eye doctor and dentist regularly.  never drive under the influence of alcohol or drugs (including prescription drugs).  those with fair skin should take precautions against the sun. it is critically important to prevent falling down (keep floor areas well-lit, dry, and free of loose objects.  If you have a cane, walker, or wheelchair, you should use it, even for short trips around the house.  Also, try not to rush). Please come back for a follow-up appointment in 4 months.

## 2014-02-09 NOTE — Progress Notes (Signed)
Subjective:    Patient ID: [name], male    DOB: 1931/04/10, 81 y.o.   MRN: [id number]  HPI Pt states few weeks of moderate sore throat, and assoc nasal congestion. Past Medical History  Diagnosis Date  . Coronary artery disease   . Leukopenia   . Smoker     quit 1968--25 ppy  . H/O: asbestos exposure   . Pernicious anemia   . ANEMIA, IRON DEFICIENCY   . COPD (chronic obstructive pulmonary disease)   . BACK PAIN, LUMBAR   . Hypertension   . GERD (gastroesophageal reflux disease)   . DIABETES MELLITUS, TYPE II   . Charcot-Marie-Tooth disease     assoiciated muscular dystrophy     Past Surgical History  Procedure Laterality Date  . Coronary artery bypass graft  1995    History   Social History  . Marital Status: Widowed    Spouse Name: N/A  . Number of Children: N/A  . Years of Education: N/A   Occupational History  . Retired     Games developerdiesel mechanic   Social History Main Topics  . Smoking status: Former Smoker -- 1.00 packs/day    Types: Cigarettes    Quit date: 01/01/1966  . Smokeless tobacco: Never Used  . Alcohol Use: No  . Drug Use: No  . Sexual Activity: Not on file   Other Topics Concern  . Not on file   Social History Narrative   Was in service during [place]          Current Outpatient Prescriptions on File Prior to Visit  Medication Sig Dispense Refill  . Ascorbic Acid (VITAMIN C) 500 MG tablet Take 500 mg by mouth 2 (two) times daily.     Marland Kitchen. aspirin 325 MG tablet Take 325 mg by mouth daily.      . Blood Glucose Calibration (ADVOCATE REDI-CODE+ CONTROL) LOW SOLN   3  . Blood Glucose Monitoring Suppl (ADVOCATE REDI-CODE+) DEVI   1  . CLEVER CHEK AUTO-CODE VOICE test strip     . clotrimazole-betamethasone (LOTRISONE) cream Apply topically 2 (two) times daily as needed.     . Cyanocobalamin (VITAMIN B-12 IJ) Inject as directed. GET A B12 INJECTION ONCE EVERY MONTH    . cyanocobalamin 1000 MCG tablet Take 500 mcg by mouth 2 (two) times  daily.     Marland Kitchen. dexlansoprazole (KAPIDEX) 60 MG capsule Take 1 capsule (60 mg total) by mouth daily. 90 capsule 3  . ferrous sulfate (FERRO-BOB) 325 (65 FE) MG tablet Take 325 mg by mouth 2 (two) times daily.     Marland Kitchen. gabapentin (NEURONTIN) 300 MG capsule 1 or 2 pills by mouth at night as needed for leg pain    . Inositol Niacinate (NIACIN FLUSH FREE) 500 MG CAPS Take by mouth.    Demetra Shiner. Lancet Devices (SIMPLE DIAGNOSTICS LANCING DEV) MISC   1  . metFORMIN (GLUCOPHAGE) 1000 MG tablet Take 1 tablet (1,000 mg total) by mouth 2 (two) times daily with a meal. 180 tablet 3  . montelukast (SINGULAIR) 10 MG tablet Take 1 tablet (10 mg total) by mouth at bedtime. 90 tablet 3  . Multiple Vitamins-Minerals (MULTIVITAMIN WITH MINERALS) tablet Take 1 tablet by mouth daily.      . niacin 500 MG tablet Take 1 tablet (500 mg total) by mouth daily with breakfast. 90 tablet 0  . PHARMACIST CHOICE LANCETS MISC   99  . sertraline (ZOLOFT) 50 MG tablet Take 1 tablet (50 mg total)  by mouth daily. 90 tablet 3  . simvastatin (ZOCOR) 80 MG tablet Take 1 tablet (80 mg total) by mouth at bedtime. 90 tablet 3  . traMADol (ULTRAM) 50 MG tablet Take 1 tablet (50 mg total) by mouth every 6 (six) hours as needed. 360 tablet 1  . Zinc 50 MG CAPS Take 1 capsule by mouth.      . nitroGLYCERIN (NITROSTAT) 0.4 MG SL tablet Place 0.4 mg under the tongue every 5 (five) minutes as needed.     No current facility-administered medications on file prior to visit.    No Known Allergies  Family History  Problem Relation Age of Onset  . Charcot-Marie-Tooth disease Sister   . Charcot-Marie-Tooth disease Brother   . Charcot-Marie-Tooth disease Brother   . Charcot-Marie-Tooth disease Brother   . Charcot-Marie-Tooth disease Sister   . Charcot-Marie-Tooth disease Son   . Charcot-Marie-Tooth disease Other     BP 134/66 mmHg  Pulse 63  Temp(Src) 97.6 F (36.4 C) (Oral)  Ht  (1.778 m)  Wt 191 lb (86.637 kg)  BMI 27.41 kg/m2  SpO2  96%  Review of Systems Denies fever and earache.  He has pain at the left middle finger pain x 1 week, since he scraped it on his leg brace.     Objective:   Physical Exam VITAL SIGNS:  See vs page GENERAL: no distress head: no deformity eyes: no periorbital swelling, no proptosis external nose and ears are normal mouth: no lesion seen Both eac's and tm's are normal Left middle finger: at the pad, there is a few mm area of abrasion.  No swelling/bleeding/erythema/drainage.        Assessment & Plan:  URI, new Allergic rhinitis might be contributing to sxs Finger abrasion, new  i have sent 2 prescriptions to your pharmacy: for an antibiotic pill, and for a steroid nasal spray. Loratadine-d (non-prescription) will help your congestion. If either of these problems are not better by next week, please call back, so i can request a specialist for you.  Please call sooner if you get worse.   Subjective:   Patient here for Medicare annual wellness visit and management of other chronic and acute problems.     Risk factors: advanced age    Roster of Physicians Providing Medical Care to Patient:  See "snapshot"   Activities of Daily Living: In your present state of health, do you have any difficulty performing the following activities? (lives with wife)  Preparing food and eating?: No  Bathing yourself: No  Getting dressed: No  Using the toilet:No  Moving around from place to place: No  In the past year have you fallen or had a near fall?: yes, but only minor injuries   Home Safety: Has smoke detector and wears seat belts. Firearms are safely stored. No excess sun exposure.  Diet and Exercise  Current exercise habits: pt says he does, as limited by health problems Dietary issues discussed: pt reports a healthy diet.    Depression Screen  Q1: Over the past two weeks, have you felt down, depressed or hopeless? Pt reports mild depression Q2: Over the past two weeks, have you  felt little interest or pleasure in doing things? no   The following portions of the patient's history were reviewed and updated as appropriate: allergies, current medications, past family history, past medical history, past social history, past surgical history and problem list.   Review of Systems  Denies hearing loss, and visual loss Objective:  Vision:  Sees opthalmologist Hearing: grossly normal Body mass index:  See vs page Msk: pt slowly performs "get-up-and-go" from a sitting position, with a cane.  Cognitive Impairment Assessment: cognition, memory and judgment appear normal.  remembers 3/3 at 5 minutes.  excellent recall.  can easily read and write a sentence (except handwriting is limited by neuropathy and contracture of the right hand).  alert and oriented x 3.     Assessment:   Medicare wellness utd on preventive parameters.    Plan:   During the course of the visit the patient was educated and counseled about appropriate screening and preventive services including:        Fall prevention   Diabetes screening  Nutrition counseling.   Vaccines / LABS Zostavax / Pneumococcal Vaccine  today  PSA  Patient Instructions (the written plan) was given to the patient.

## 2014-03-19 ENCOUNTER — Ambulatory Visit (INDEPENDENT_AMBULATORY_CARE_PROVIDER_SITE_OTHER): Payer: Commercial Managed Care - HMO

## 2014-03-19 VITALS — BP 152/82 | HR 69 | Resp 18

## 2014-03-19 DIAGNOSIS — Q828 Other specified congenital malformations of skin: Secondary | ICD-10-CM | POA: Diagnosis not present

## 2014-03-19 DIAGNOSIS — E1140 Type 2 diabetes mellitus with diabetic neuropathy, unspecified: Secondary | ICD-10-CM

## 2014-03-19 DIAGNOSIS — M79675 Pain in left toe(s): Secondary | ICD-10-CM

## 2014-03-19 DIAGNOSIS — R269 Unspecified abnormalities of gait and mobility: Secondary | ICD-10-CM | POA: Diagnosis not present

## 2014-03-19 DIAGNOSIS — G600 Hereditary motor and sensory neuropathy: Secondary | ICD-10-CM | POA: Diagnosis not present

## 2014-03-19 DIAGNOSIS — B351 Tinea unguium: Secondary | ICD-10-CM

## 2014-03-19 NOTE — Progress Notes (Signed)
   Subjective:    Patient ID: David PaulsFrank J Brumm, male    DOB: 1931-08-29, 81 y.o.   MRN: [id number]  HPI I AM HERE TO GET MY CALLUS TRIMMED ON MY LEFT FOOT    Review of Systems no new findings or systemic changes noted    Objective:   Physical Exam Patient options this time for follow-up does have history of diabetes and complications unchanged pedal pulses DP and PT one over 4 bilateral Refill time 3 seconds patient does have gait difficulty with CMT dropfoot type deformity AFO braces bilateral the left foot demonstrates keratoses sub-first MTP area left there is no open wounds no ulcers no secondary infection nails thick brittle Crumley friable dystrophic do not require debridement at this time they're not overgrown or other pain for symptomatic lesion is building up and per patient request did of symptoms and some pain discomfort and prominence is requesting debridement at this time in approximately 2 months since last time it was debrided. Remainder the exam unchanged patient does have digital contractures gait abnormality dropfoot deformity with keratoses or poor keratoses sub-first left       Assessment & Plan:  Assessment diabetes with history peripheral neuropathy and complications gait abnormality with dropfoot deformity and plantar grade metatarsal keratoses sub-1 left is debrided at this time treated with lidocaine Neosporin and Band-Aid dressing a small pinpoint bleeding was identified however will be monitored for any difficulties if there's any exacerbations or new issues we'll contact us immediately again debridement down to dermal level there is no open wounds no ulcers no secondary infections recheck in 2-3 months as needed for future palliative care  Alvan Dameichard Ashni Lonzo DPM

## 2014-03-29 DIAGNOSIS — H5203 Hypermetropia, bilateral: Secondary | ICD-10-CM | POA: Diagnosis not present

## 2014-03-29 DIAGNOSIS — Z0100 Encounter for examination of eyes and vision without abnormal findings: Secondary | ICD-10-CM | POA: Diagnosis not present

## 2014-03-29 DIAGNOSIS — H52209 Unspecified astigmatism, unspecified eye: Secondary | ICD-10-CM | POA: Diagnosis not present

## 2014-03-29 DIAGNOSIS — H524 Presbyopia: Secondary | ICD-10-CM | POA: Diagnosis not present

## 2014-03-29 DIAGNOSIS — H5210 Myopia, unspecified eye: Secondary | ICD-10-CM | POA: Diagnosis not present

## 2014-03-29 DIAGNOSIS — H2510 Age-related nuclear cataract, unspecified eye: Secondary | ICD-10-CM | POA: Diagnosis not present

## 2014-04-19 DIAGNOSIS — G600 Hereditary motor and sensory neuropathy: Secondary | ICD-10-CM | POA: Insufficient documentation

## 2014-04-28 DIAGNOSIS — J0100 Acute maxillary sinusitis, unspecified: Secondary | ICD-10-CM | POA: Diagnosis not present

## 2014-04-28 DIAGNOSIS — L0390 Cellulitis, unspecified: Secondary | ICD-10-CM | POA: Diagnosis not present

## 2014-04-28 DIAGNOSIS — J209 Acute bronchitis, unspecified: Secondary | ICD-10-CM | POA: Diagnosis not present

## 2014-04-28 DIAGNOSIS — E1165 Type 2 diabetes mellitus with hyperglycemia: Secondary | ICD-10-CM | POA: Diagnosis not present

## 2014-04-29 ENCOUNTER — Telehealth: Payer: Self-pay | Admitting: Endocrinology

## 2014-04-29 DIAGNOSIS — L989 Disorder of the skin and subcutaneous tissue, unspecified: Secondary | ICD-10-CM

## 2014-04-29 NOTE — Telephone Encounter (Signed)
Patient's daughter called stating she would like her father referred to [facility]   Please advise    Thank you

## 2014-04-30 ENCOUNTER — Telehealth: Payer: Self-pay

## 2014-04-30 DIAGNOSIS — L989 Disorder of the skin and subcutaneous tissue, unspecified: Secondary | ICD-10-CM | POA: Insufficient documentation

## 2014-04-30 NOTE — Telephone Encounter (Signed)
Patient notified that referral has been placed  

## 2014-04-30 NOTE — Telephone Encounter (Signed)
See below. I contacted the patient and he sated he would like to see a dermatologist regarding the spot on his left middle finger. Patient states the area still has a open sore and is not healing.

## 2014-04-30 NOTE — Telephone Encounter (Signed)
Error

## 2014-04-30 NOTE — Telephone Encounter (Signed)
done

## 2014-05-03 DIAGNOSIS — R0602 Shortness of breath: Secondary | ICD-10-CM | POA: Diagnosis not present

## 2014-05-03 DIAGNOSIS — R05 Cough: Secondary | ICD-10-CM | POA: Diagnosis not present

## 2014-05-03 DIAGNOSIS — R509 Fever, unspecified: Secondary | ICD-10-CM | POA: Diagnosis not present

## 2014-05-03 DIAGNOSIS — J189 Pneumonia, unspecified organism: Secondary | ICD-10-CM | POA: Diagnosis not present

## 2014-05-04 ENCOUNTER — Telehealth: Payer: Self-pay | Admitting: Endocrinology

## 2014-05-04 NOTE — Telephone Encounter (Signed)
Patient's daughter called and would like to have Megan call her back   Thank you

## 2014-05-04 NOTE — Telephone Encounter (Signed)
I contacted patients daughter. Pt was just released from [place] [facility] yesterday. Pt has a follow up office visit scheduled for tomorrow at 230.

## 2014-05-05 ENCOUNTER — Ambulatory Visit (INDEPENDENT_AMBULATORY_CARE_PROVIDER_SITE_OTHER): Payer: Commercial Managed Care - HMO | Admitting: Endocrinology

## 2014-05-05 ENCOUNTER — Ambulatory Visit
Admission: RE | Admit: 2014-05-05 | Discharge: 2014-05-05 | Disposition: A | Discharge status: Home / Self Care | Payer: Commercial Managed Care - HMO | Source: Ambulatory Visit | Attending: Endocrinology | Admitting: Endocrinology

## 2014-05-05 ENCOUNTER — Encounter: Payer: Self-pay | Admitting: Endocrinology

## 2014-05-05 VITALS — BP 132/66 | HR 68 | Temp 98.4°F | Ht 70.0 in | Wt 174.0 lb

## 2014-05-05 DIAGNOSIS — R634 Abnormal weight loss: Secondary | ICD-10-CM | POA: Diagnosis not present

## 2014-05-05 DIAGNOSIS — D61818 Other pancytopenia: Secondary | ICD-10-CM | POA: Diagnosis not present

## 2014-05-05 DIAGNOSIS — J189 Pneumonia, unspecified organism: Secondary | ICD-10-CM

## 2014-05-05 DIAGNOSIS — R0602 Shortness of breath: Secondary | ICD-10-CM | POA: Diagnosis not present

## 2014-05-05 LAB — BASIC METABOLIC PANEL
BUN: 23 mg/dL (ref 6–23)
CHLORIDE: 108 meq/L (ref 96–112)
CO2: 22 meq/L (ref 19–32)
CREATININE: 0.77 mg/dL (ref 0.40–1.50)
Calcium: 9.1 mg/dL (ref 8.4–10.5)
GFR: 103.2 mL/min (ref >60.00)
Glucose, Bld: 133 mg/dL — ABNORMAL HIGH (ref 70–99)
POTASSIUM: 4.8 meq/L (ref 3.5–5.1)
Sodium: 138 mEq/L (ref 135–145)

## 2014-05-05 LAB — CBC WITH DIFFERENTIAL/PLATELET
BASOS PCT: 0.2 % (ref 0.0–3.0)
Basophils Absolute: 0 10*3/uL (ref 0.0–0.1)
EOS PCT: 0 % (ref 0.0–5.0)
Eosinophils Absolute: 0 10*3/uL (ref 0.0–0.7)
HEMATOCRIT: 34 % — AB (ref 39.0–52.0)
HEMOGLOBIN: 11.8 g/dL — AB (ref 13.0–17.0)
LYMPHS ABS: 0.6 10*3/uL — AB (ref 0.7–4.0)
Lymphocytes Relative: 11.4 % — ABNORMAL LOW (ref 12.0–46.0)
MCHC: 34.7 g/dL (ref 30.0–36.0)
MCV: 87.3 fl (ref 78.0–100.0)
MONO ABS: 0.5 10*3/uL (ref 0.1–1.0)
MONOS PCT: 11 % (ref 3.0–12.0)
Neutro Abs: 3.8 10*3/uL (ref 1.4–7.7)
Neutrophils Relative %: 77.4 % — ABNORMAL HIGH (ref 43.0–77.0)
Platelets: 161 10*3/uL (ref 150.0–400.0)
RBC: 3.9 Mil/uL — AB (ref 4.22–5.81)
RDW: 13.2 % (ref 11.5–15.5)
WBC: 4.9 10*3/uL (ref 4.0–10.5)

## 2014-05-05 LAB — IBC PANEL
Iron: 79 ug/dL (ref 42–165)
SATURATION RATIOS: 27.8 % (ref 20.0–50.0)
Transferrin: 203 mg/dL — ABNORMAL LOW (ref 212.0–360.0)

## 2014-05-05 MED ORDER — METRONIDAZOLE 500 MG PO TABS: 500.0000 mg | ORAL_TABLET | Freq: Two times a day (BID) | ORAL | Status: DC

## 2014-05-05 NOTE — Patient Instructions (Addendum)
blood tests and a chest x-ray are requested for you today.  We'll let you know about the results.  Please see 2 specialists.  you will receive a phone call, about days and time for appointments.  i have sent a prescription to your pharmacy, for an antibiotic for the diarrhea.  Keep your left middle finger covered with antibiotic ointment and a bandaid.

## 2014-05-05 NOTE — Progress Notes (Signed)
Subjective:    Patient ID: David PaulsFrank J [name], male    DOB: 05/01/1931, 81 y.o.   MRN: [id number]  HPI  The state of at least three ongoing medical problems is addressed today, with interval history of each noted here: Weight loss persists.  Appetite is poor.  Pancytopenia is noted in [place] hospital.  Denies BRBPR. He was rx'ed for pneumonia at  hosp: sob persists.  He has dry cough.  Past Medical History  Diagnosis Date  . Coronary artery disease   . Leukopenia   . Smoker     quit 1968--25 ppy  . H/O: asbestos exposure   . Pernicious anemia   . ANEMIA, IRON DEFICIENCY   . COPD (chronic obstructive pulmonary disease)   . BACK PAIN, LUMBAR   . Hypertension   . GERD (gastroesophageal reflux disease)   . DIABETES MELLITUS, TYPE II   . Charcot-Marie-Tooth disease     assoiciated muscular dystrophy     Past Surgical History  Procedure Laterality Date  . Coronary artery bypass graft  1995    History   Social History  . Marital Status: Widowed    Spouse Name: N/A  . Number of Children: N/A  . Years of Education: N/A   Occupational History  . Retired     Games developerdiesel mechanic   Social History Main Topics  . Smoking status: Former Smoker -- 1.00 packs/day    Types: Cigarettes    Quit date: 01/01/1966  . Smokeless tobacco: Never Used  . Alcohol Use: No  . Drug Use: No  . Sexual Activity: Not on file   Other Topics Concern  . Not on file   Social History Narrative   Was in service during [place]          Current Outpatient Prescriptions on File Prior to Visit  Medication Sig Dispense Refill  . Ascorbic Acid (VITAMIN C) 500 MG tablet Take 500 mg by mouth 2 (two) times daily.     Marland Kitchen. aspirin 325 MG tablet Take 325 mg by mouth daily.      . Blood Glucose Calibration (ADVOCATE REDI-CODE+ CONTROL) LOW SOLN   3  . Blood Glucose Monitoring Suppl (ADVOCATE REDI-CODE+) DEVI   1  . cephALEXin (KEFLEX) 500 MG capsule Take 1 capsule (500 mg total) by mouth 3 (three)  times daily. 21 capsule 0  . CLEVER CHEK AUTO-CODE VOICE test strip     . clotrimazole-betamethasone (LOTRISONE) cream Apply topically 2 (two) times daily as needed.     . Cyanocobalamin (VITAMIN B-12 IJ) Inject as directed. GET A B12 INJECTION ONCE EVERY MONTH    . cyanocobalamin 1000 MCG tablet Take 500 mcg by mouth 2 (two) times daily.     Marland Kitchen. dexlansoprazole (KAPIDEX) 60 MG capsule Take 1 capsule (60 mg total) by mouth daily. 90 capsule 3  . ferrous sulfate (FERRO-BOB) 325 (65 FE) MG tablet Take 325 mg by mouth 2 (two) times daily.     . fluticasone (FLONASE) 50 MCG/ACT nasal spray Place 2 sprays into both nostrils daily. 16 g 6  . gabapentin (NEURONTIN) 300 MG capsule 1 or 2 pills by mouth at night as needed for leg pain    . Inositol Niacinate (NIACIN FLUSH FREE) 500 MG CAPS Take by mouth.    Demetra Shiner. Lancet Devices (SIMPLE DIAGNOSTICS LANCING DEV) MISC   1  . metFORMIN (GLUCOPHAGE) 1000 MG tablet Take 1 tablet (1,000 mg total) by mouth 2 (two) times daily with a meal. 180 tablet  3  . montelukast (SINGULAIR) 10 MG tablet Take 1 tablet (10 mg total) by mouth at bedtime. 90 tablet 3  . Multiple Vitamins-Minerals (MULTIVITAMIN WITH MINERALS) tablet Take 1 tablet by mouth daily.      . niacin 500 MG tablet Take 1 tablet (500 mg total) by mouth daily with breakfast. 90 tablet 0  . nitroGLYCERIN (NITROSTAT) 0.4 MG SL tablet Place 0.4 mg under the tongue every 5 (five) minutes as needed.    Marland Kitchen. PHARMACIST CHOICE LANCETS MISC   99  . sertraline (ZOLOFT) 50 MG tablet Take 1 tablet (50 mg total) by mouth daily. 90 tablet 3  . simvastatin (ZOCOR) 80 MG tablet Take 1 tablet (80 mg total) by mouth at bedtime. 90 tablet 3  . traMADol (ULTRAM) 50 MG tablet Take 1 tablet (50 mg total) by mouth every 6 (six) hours as needed. 360 tablet 1  . Zinc 50 MG CAPS Take 1 capsule by mouth.       No current facility-administered medications on file prior to visit.    No Known Allergies  Family History  Problem Relation  Age of Onset  . Charcot-Marie-Tooth disease Sister   . Charcot-Marie-Tooth disease Brother   . Charcot-Marie-Tooth disease Brother   . Charcot-Marie-Tooth disease Brother   . Charcot-Marie-Tooth disease Sister   . Charcot-Marie-Tooth disease Son   . Charcot-Marie-Tooth disease Other     BP 132/66 mmHg  Pulse 68  Temp(Src) 98.4 F (36.9 C) (Oral)  Ht 5\' 10"  (1.778 m)  Wt 174 lb (78.926 kg)  BMI 24.97 kg/m2  SpO2 95%   Review of Systems Denies hematuria, but he has diarrhea.      Objective:   Physical Exam VITAL SIGNS:  See vs page GENERAL: no distress LUNGS:  Clear to auscultation, except for diffuse rhonchi.    Lab Results  Component Value Date   WBC 4.9 05/05/2014   HGB 11.8* 05/05/2014   HCT 34.0* 05/05/2014   MCV 87.3 05/05/2014   PLT 161.0 05/05/2014   CXR: chronic changes only    Assessment & Plan:  Pancytopenia, much better Pneumonia: x-ray is better, but clinically slow to improve weight loss, worse, uncertain etiology Diarrhea: we should rx empirically for c diff.   Patient is advised the following: Patient Instructions  blood tests and a chest x-ray are requested for you today.  We'll let you know about the results.  Please see 2 specialists.  you will receive a phone call, about days and time for appointments.  i have sent a prescription to your pharmacy, for an antibiotic for the diarrhea.  Keep your left middle finger covered with antibiotic ointment and a bandaid.

## 2014-05-06 DIAGNOSIS — R634 Abnormal weight loss: Secondary | ICD-10-CM | POA: Insufficient documentation

## 2014-05-06 LAB — PTH, INTACT AND CALCIUM
Calcium: 8.5 mg/dL (ref 8.4–10.5)
PTH: 52 pg/mL (ref 14–64)

## 2014-05-12 DIAGNOSIS — L578 Other skin changes due to chronic exposure to nonionizing radiation: Secondary | ICD-10-CM | POA: Diagnosis not present

## 2014-05-13 ENCOUNTER — Telehealth: Payer: Self-pay | Admitting: Hematology & Oncology

## 2014-05-13 NOTE — Telephone Encounter (Signed)
Called pt and let him know that we were referring him to [facility] and that rick would be giving him a call.

## 2014-05-20 ENCOUNTER — Telehealth: Payer: Self-pay | Admitting: Hematology & Oncology

## 2014-05-20 ENCOUNTER — Telehealth: Payer: Self-pay

## 2014-05-20 DIAGNOSIS — R0602 Shortness of breath: Secondary | ICD-10-CM

## 2014-05-20 NOTE — Telephone Encounter (Signed)
[name] from [facility] called requesting a [facility] referral. Patient has a appointment with [name] on 5/24 and the referral needs to be placed before they can see him.  Thanks!

## 2014-05-20 NOTE — Telephone Encounter (Signed)
[name] Nbr: [id number]: [id number] Requesting Provider: Dr. Romero BellingSean [name] Treating Provider: Dr. Arlan OrganPeter [name] Visits: 6 Status: Approved Dates: 06/21/2014 - 12/18/2014    COPY SCANNED

## 2014-05-20 NOTE — Telephone Encounter (Signed)
A referral was sent to our office to sch New patient appt.  Patient was called and message was left with apt date/time and to call office back to confirm that message was received

## 2014-05-20 NOTE — Telephone Encounter (Signed)
done

## 2014-05-21 ENCOUNTER — Telehealth: Payer: Self-pay | Admitting: Endocrinology

## 2014-05-21 NOTE — Telephone Encounter (Signed)
Patient daughter has some questions about patients test, please advise

## 2014-05-21 NOTE — Telephone Encounter (Signed)
I tried to contact the patient and the patients daughter. Will try again at a later time.

## 2014-05-24 ENCOUNTER — Telehealth: Payer: Self-pay | Admitting: Hematology & Oncology

## 2014-05-24 NOTE — Telephone Encounter (Signed)
Lt voice message for pt regarding 06/21/14 appt at 12.

## 2014-05-24 NOTE — Telephone Encounter (Signed)
Pt requesting call back he isnt sure why you called. When I informed him of the message from LutherMegan about results he said the results from lab on 5/4 had already been relayed. He would like a call back from LibertyMegan when she gets the chance. There is no rush.

## 2014-05-24 NOTE — Telephone Encounter (Signed)
I requested a call back from the patient to discuss.

## 2014-05-25 ENCOUNTER — Other Ambulatory Visit: Payer: Self-pay | Admitting: Internal Medicine

## 2014-05-25 ENCOUNTER — Ambulatory Visit (INDEPENDENT_AMBULATORY_CARE_PROVIDER_SITE_OTHER): Payer: Commercial Managed Care - HMO | Admitting: Internal Medicine

## 2014-05-25 ENCOUNTER — Encounter: Payer: Self-pay | Admitting: Internal Medicine

## 2014-05-25 VITALS — BP 132/60 | HR 64 | Ht 70.0 in | Wt 183.0 lb

## 2014-05-25 DIAGNOSIS — R0602 Shortness of breath: Secondary | ICD-10-CM | POA: Diagnosis not present

## 2014-05-25 MED ORDER — PANTOPRAZOLE SODIUM 40 MG PO TBEC: 40.0000 mg | DELAYED_RELEASE_TABLET | Freq: Every day | ORAL | Status: DC

## 2014-05-25 MED ORDER — FAMOTIDINE 20 MG PO TABS
ORAL_TABLET | ORAL | Status: DC
Start: 2014-05-25 — End: 2016-11-09

## 2014-05-25 NOTE — Progress Notes (Signed)
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 Was following with Dr. Billy Fischeravid Simonds here at [facility] and was told in 1999/2000 that his "diaphragms were dead". Has been on BiPAP since 1999. cpst c/w muscle weakness and ? Asthma component  rec qvar but did not use it   Admitted [facility] for HCAP and d/c 05/03/14    05/25/2014 1st [facility] office visit/ Sherene SiresWert / consultation per Dr Everardo AllEllison p adm  Chief Complaint  Patient presents with  . Pulmonary Consult    Referred by Dr. Romero BellingSean Ellison. Pt c/o SOB walking "not very far"- has had PNA recently.   on bipap per [facility] neurology dept yearly f/u yearly s respiratory problems / sorethroat in am  No problem sitting but running out of air talking Still walking use [facility] parking / does ok room to room in mobile home/ no longer shopping   No obvious patterns in day to day or daytime variabilty or assoc chronic cough or cp or chest tightness, subjective wheeze overt sinus or hb symptoms. No unusual exp hx or h/o childhood pna/ asthma or knowledge of premature birth.  Sleeping ok on bipap  Per [facility] neurolog[facility] without nocturnal  or early am exacerbation  of respiratory  c/o's or need for noct saba. Also denies any obvious fluctuation of symptoms with weather or environmental changes or other aggravating or alleviating factors except as outlined above   Current Medications, Allergies, Complete Past Medical History, Past Surgical History, Family History, and Social History were reviewed in [facility] electronic medical record.           Review of Systems  Constitutional: Negative for fever, chills, activity change, appetite change and unexpected weight change.    HENT: Positive for postnasal drip and sore throat. Negative for congestion, dental problem, rhinorrhea, sneezing, trouble swallowing and voice change.   Eyes: Negative for visual disturbance.  Respiratory: Positive for shortness of breath. Negative for cough and choking.   Cardiovascular: Negative for chest pain and leg swelling.  Gastrointestinal: Negative for nausea, vomiting and abdominal pain.  Genitourinary: Negative for difficulty urinating.  Musculoskeletal: Negative for arthralgias.  Skin: Negative for rash.  Psychiatric/Behavioral: Negative for behavioral problems and confusion.       Objective:   Physical Exam  amb wm wearing foot braces with classic voice fatigue/ chewing mint gum  Wt Readings from Last 3 Encounters:  05/25/14 183 lb (83.008 kg)  05/05/14 174 lb (78.926 kg)  02/09/14 191 lb (86.637 kg)    Vital signs reviewed  HEENT: full dentures in place/ nl turbinates, and orophanx. Nl external ear canals without cough reflex   NECK :  without JVD/Nodes/TM/ nl carotid upstrokes bilaterally   LUNGS: no acc muscle use, clear to A and P bilaterally without cough on insp or exp maneuvers   CV:  RRR  no s3 or murmur or increase in P2, no edema   ABD:  soft and nontender with limited excursion. No bruits or organomegaly, bowel sounds nl  MS:  warm without deformities, calf tenderness, cyanosis or clubbing  SKIN: warm and dry without lesions    NEURO:  alert, approp, no deficits but wearing  bilateral foot /ankle braces      I personally reviewed images and agree with radiology impression as follows:  CXR:  05/05/14 COPD and evidence of previous granulomatous infection. Stable scarring at the right lung base may be related to the known asbestos exposure. No active cardiopulmonary disease is evident.       Assessment & Plan:

## 2014-05-25 NOTE — Telephone Encounter (Signed)
Left voicemail advising patient of note below. Requested call back if the patient would like to discuss.

## 2014-05-25 NOTE — Assessment & Plan Note (Signed)
Diagnosed  [name] Tooth Disease in 1982 (following RLE fracture). Was following with Dr. Billy Fischeravid Simonds here at [facility] and was told in 1999/2000 that his "diaphragms were dead". Has been on BiPAP since 1999.  - Fluoro 03/29/1998 FLUOROSCOPY OF THE HEMIDIAPHRAGMS DEMONSTRATES SYMMETRICAL MOVEMENT OF THE DIAPHRAGMS. HOWEVER, THERE IS VERY LITTLE VERTICAL MOTION OF THE DIAPHRAGMS WITH INSPIRATION. IMPRESSION   - 05/25/2014   Walked RA x one lap @ 185 stopped due  legs weak and  Sob @  slow pace / no desats   I had an extended discussion with the patient and wife reviewing all relevant studies completed to date and  lasting  35 minutes  on the following ongoing concerns:   1)  His sob with voice fatigue is typical of GERD/ LPR, not diaphragm weakness > rec 6 week trial of diet/ max acid suppression and f/u pfts  2) noct sob well compensated/ corrected by using the bipap/ f/u for this and other obvious neuromuscular issues via [facility] with records in care everywhere reviewed.  3) may have an asthmatic component though doubt it > max rx gerd first then return for pfts to sort out and get MEP/MIP with it   4) Each maintenance medication was reviewed in detail including most importantly the difference between maintenance and as needed and under what circumstances the prns are to be used.  Please see instructions for details which were reviewed in writing and the patient given a copy.

## 2014-05-25 NOTE — Telephone Encounter (Signed)
This is because your blood cells are low. i think the appointment is a good idea.

## 2014-05-25 NOTE — Patient Instructions (Addendum)
Pantoprazole (protonix) 40 mg   Take  30-60 min before first meal of the day and Pepcid (famotidine)  20 mg one @  bedtime until return to office - this is the best way to tell whether stomach acid is contributing to your problem.   GERD (REFLUX)  is an extremely common cause of respiratory symptoms just like yours , many times with no obvious heartburn at all.    It can be treated with medication, but also with lifestyle changes including avoidance of late meals, elevation of the head of your bed (ideally with 6 inch  bed blocks) excessive alcohol, smoking cessation, and avoid fatty foods, chocolate, peppermint, colas, red wine, and acidic juices such as orange juice.  NO MINT OR MENTHOL PRODUCTS SO NO COUGH DROPS  USE SUGARLESS CANDY INSTEAD (Jolley ranchers or Stover's or Life Savers) or even ice chips will also do - the key is to swallow to prevent all throat clearing. NO OIL BASED VITAMINS - use powdered substitutes.    Please schedule a follow up office visit in 6 weeks, call sooner if needed with pfts on return  Late add needs MIP and MEP on return

## 2014-05-25 NOTE — Telephone Encounter (Signed)
I contacted the patient. Patient wanted to know the reason for the oncology referral for Dr. Twanna HyEnever. Patient stated he did not know why he needed this referral. Please advise how to proceed. Thanks!

## 2014-05-26 ENCOUNTER — Telehealth: Payer: Self-pay | Admitting: Hematology & Oncology

## 2014-05-26 DIAGNOSIS — R634 Abnormal weight loss: Secondary | ICD-10-CM | POA: Diagnosis not present

## 2014-05-26 DIAGNOSIS — N4289 Other specified disorders of prostate: Secondary | ICD-10-CM | POA: Diagnosis not present

## 2014-05-26 NOTE — Telephone Encounter (Signed)
I spoke w NEW PATIENT today to remind them of their appointment with Dr. Ennever. Also, advised them to bring all medication bottles and insurance card information. ° °

## 2014-05-27 ENCOUNTER — Telehealth: Payer: Self-pay | Admitting: Endocrinology

## 2014-05-27 NOTE — Telephone Encounter (Signed)
please call patient: CT shows no cause of weight loss. We'll follow for now

## 2014-05-27 NOTE — Telephone Encounter (Signed)
Left voicemail advising of note below. Requested call back if the patient would like to discuss. 

## 2014-06-11 ENCOUNTER — Encounter: Payer: Self-pay | Admitting: Endocrinology

## 2014-06-11 ENCOUNTER — Ambulatory Visit (INDEPENDENT_AMBULATORY_CARE_PROVIDER_SITE_OTHER): Payer: Commercial Managed Care - HMO | Admitting: Endocrinology

## 2014-06-11 VITALS — BP 148/68 | HR 62 | Temp 98.6°F | Wt 185.0 lb

## 2014-06-11 DIAGNOSIS — D61818 Other pancytopenia: Secondary | ICD-10-CM | POA: Diagnosis not present

## 2014-06-11 DIAGNOSIS — E1142 Type 2 diabetes mellitus with diabetic polyneuropathy: Secondary | ICD-10-CM

## 2014-06-11 LAB — CBC WITH DIFFERENTIAL/PLATELET
BASOS ABS: 0 10*3/uL (ref 0.0–0.1)
Basophils Relative: 0.4 % (ref 0.0–3.0)
EOS ABS: 0.1 10*3/uL (ref 0.0–0.7)
Eosinophils Relative: 2.5 % (ref 0.0–5.0)
HEMATOCRIT: 34 % — AB (ref 39.0–52.0)
Hemoglobin: 11.2 g/dL — ABNORMAL LOW (ref 13.0–17.0)
Lymphocytes Relative: 20.3 % (ref 12.0–46.0)
Lymphs Abs: 1 10*3/uL (ref 0.7–4.0)
MCHC: 33 g/dL (ref 30.0–36.0)
MCV: 91.9 fl (ref 78.0–100.0)
Monocytes Absolute: 0.7 10*3/uL (ref 0.1–1.0)
Monocytes Relative: 14.2 % — ABNORMAL HIGH (ref 3.0–12.0)
NEUTROS PCT: 62.6 % (ref 43.0–77.0)
Neutro Abs: 3.2 10*3/uL (ref 1.4–7.7)
Platelets: 195 10*3/uL (ref 150.0–400.0)
RBC: 3.7 Mil/uL — ABNORMAL LOW (ref 4.22–5.81)
RDW: 14.4 % (ref 11.5–15.5)
WBC: 5.1 10*3/uL (ref 4.0–10.5)

## 2014-06-11 LAB — HEMOGLOBIN A1C: Hgb A1c MFr Bld: 6.2 % (ref 4.6–6.5)

## 2014-06-11 NOTE — Patient Instructions (Addendum)
blood tests are requested for you today.  We'll let you know about the results. If the blood cells are good again, you can cancel the appointment with the blood specialist. please see Dr Sherene Sires as scheduled.  We'll follow your weight in the future.   Please come back for a follow-up appointment in 6 months.

## 2014-06-11 NOTE — Progress Notes (Signed)
Subjective:    Patient ID: [name], male    DOB: 21-Jul-1931, 81 y.o.   MRN: [id number]  HPI The state of at least three ongoing medical problems is addressed today, with interval history of each noted here: Pt returns for f/u of diabetes mellitus: DM type: 2 Dx'ed: 2008 Complications:  Therapy: metformin. DKA: never Severe hypoglycemia: never Pancreatitis: never Other: he has never been on insulin Interval history: no cbg record, but states cbg's are well-controlled Weight loss persists.  Appetite is much better Pancytopenia is noted in [place] [facility].  Denies BRBPR.  He was rx'ed for pneumonia at [facility]: no change in chronic sob.  He has dry cough.  Past Medical History  Diagnosis Date  . Coronary artery disease   . Leukopenia   . Smoker     quit 1968--25 ppy  . H/O: asbestos exposure   . Pernicious anemia   . ANEMIA, IRON DEFICIENCY   . COPD (chronic obstructive pulmonary disease)   . BACK PAIN, LUMBAR   . Hypertension   . GERD (gastroesophageal reflux disease)   . DIABETES MELLITUS, TYPE II   . Charcot-Marie-Tooth disease     assoiciated muscular dystrophy     Past Surgical History  Procedure Laterality Date  . Coronary artery bypass graft  1995    History   Social History  . Marital Status: Widowed    Spouse Name: N/A  . Number of Children: N/A  . Years of Education: N/A   Occupational History  . Retired     Games developer   Social History Main Topics  . Smoking status: Former Smoker -- 1.00 packs/day for 20 years    Types: Cigarettes    Quit date: 01/01/1966  . Smokeless tobacco: Never Used  . Alcohol Use: No  . Drug Use: No  . Sexual Activity: Not on file   Other Topics Concern  . Not on file   Social History Narrative   Was in service during [place] War          Current Outpatient Prescriptions on File Prior to Visit  Medication Sig Dispense Refill  . Ascorbic Acid (VITAMIN C) 500 MG tablet Take 500 mg by mouth 2  (two) times daily.     Marland Kitchen aspirin 325 MG tablet Take 325 mg by mouth daily.      . Blood Glucose Calibration (ADVOCATE REDI-CODE+ CONTROL) LOW SOLN   3  . Blood Glucose Monitoring Suppl (ADVOCATE REDI-CODE+) DEVI   1  . CLEVER CHEK AUTO-CODE VOICE test strip     . clotrimazole-betamethasone (LOTRISONE) cream Apply topically 2 (two) times daily as needed.     . Cyanocobalamin (VITAMIN B-12 IJ) Inject as directed. GET A B12 INJECTION ONCE EVERY MONTH    . cyanocobalamin 1000 MCG tablet Take 500 mcg by mouth 2 (two) times daily.     . famotidine (PEPCID) 20 MG tablet One at bedtime 30 tablet 2  . Inositol Niacinate (NIACIN FLUSH FREE) 500 MG CAPS Take by mouth.    Demetra Shiner Devices (SIMPLE DIAGNOSTICS LANCING DEV) MISC   1  . metFORMIN (GLUCOPHAGE) 1000 MG tablet Take 1 tablet (1,000 mg total) by mouth 2 (two) times daily with a meal. 180 tablet 3  . Multiple Vitamins-Minerals (MULTIVITAMIN WITH MINERALS) tablet Take 1 tablet by mouth daily.      . pantoprazole (PROTONIX) 40 MG tablet Take 1 tablet (40 mg total) by mouth daily. Take 30-60 min before first meal of the day  30 tablet 2  . sertraline (ZOLOFT) 50 MG tablet Take 1 tablet (50 mg total) by mouth daily. 90 tablet 3  . simvastatin (ZOCOR) 80 MG tablet Take 1 tablet (80 mg total) by mouth at bedtime. 90 tablet 3  . traMADol (ULTRAM) 50 MG tablet Take 1 tablet (50 mg total) by mouth every 6 (six) hours as needed. 360 tablet 1  . Zinc 50 MG CAPS Take 1 capsule by mouth.      . nitroGLYCERIN (NITROSTAT) 0.4 MG SL tablet Place 0.4 mg under the tongue every 5 (five) minutes as needed.     No current facility-administered medications on file prior to visit.    No Known Allergies  Family History  Problem Relation Age of Onset  . Charcot-Marie-Tooth disease Sister   . Charcot-Marie-Tooth disease Brother   . Charcot-Marie-Tooth disease Brother   . Charcot-Marie-Tooth disease Brother   . Charcot-Marie-Tooth disease Sister   .  Charcot-Marie-Tooth disease Son   . Charcot-Marie-Tooth disease Other   . COPD Daughter     never smoker    BP 148/68 mmHg  Pulse 62  Temp(Src) 98.6 F (37 C) (Oral)  Wt 185 lb (83.915 kg)  SpO2 99%    Review of Systems Denies cough and fever    Objective:   Physical Exam VITAL SIGNS:  See vs page GENERAL: no distress LUNGS:  Clear to auscultation   Lab Results  Component Value Date   HGBA1C 6.2 06/11/2014   Lab Results  Component Value Date   WBC 5.1 06/11/2014   HGB 11.2* 06/11/2014   HCT 34.0* 06/11/2014   MCV 91.9 06/11/2014   PLT 195.0 06/11/2014      Assessment & Plan:  Pancytopenia, stable. Pneumonia: clinically improved weight loss: was prob due to recent illness.  Improved.  We'll follow DM: well-controlled: Please continue the same metformin  Patient is advised the following: Patient Instructions  blood tests are requested for you today.  We'll let you know about the results. If the blood cells are good again, you can cancel the appointment with the blood specialist. please see Dr Sherene Sires as scheduled.  We'll follow your weight in the future.   Please come back for a follow-up appointment in 6 months.    addendum: i told pt he can skip hematol appt

## 2014-06-13 ENCOUNTER — Telehealth: Payer: Self-pay | Admitting: Endocrinology

## 2014-06-13 NOTE — Telephone Encounter (Signed)
please call patient: As your blood cells are good again, you can cancel appointment with the blood specialist.

## 2014-06-14 ENCOUNTER — Ambulatory Visit (INDEPENDENT_AMBULATORY_CARE_PROVIDER_SITE_OTHER): Payer: Commercial Managed Care - HMO | Admitting: Ophthalmology

## 2014-06-14 ENCOUNTER — Telehealth: Payer: Self-pay | Admitting: *Deleted

## 2014-06-14 NOTE — Telephone Encounter (Signed)
Patient advised of note below and voiced understanding,  

## 2014-06-14 NOTE — Telephone Encounter (Signed)
Patient called and stated that Dr. Everardo All said his bloodwork was up so he could cancel his consult with Dr. Myna Hidalgo.  Appointment cancelled per instruction

## 2014-06-15 ENCOUNTER — Encounter: Payer: Self-pay | Admitting: Endocrinology

## 2014-06-16 ENCOUNTER — Other Ambulatory Visit: Payer: Self-pay | Admitting: Endocrinology

## 2014-06-17 NOTE — Telephone Encounter (Signed)
Will you sign off on this medication during Dr. George Hugh absence? Thanks!

## 2014-06-18 ENCOUNTER — Other Ambulatory Visit: Payer: Self-pay | Admitting: *Deleted

## 2014-06-18 MED ORDER — TRAMADOL HCL 50 MG PO TABS: 50.0000 mg | ORAL_TABLET | Freq: Four times a day (QID) | ORAL | Status: DC | PRN

## 2014-06-21 ENCOUNTER — Ambulatory Visit: Payer: Commercial Managed Care - HMO | Admitting: Hematology & Oncology

## 2014-06-21 ENCOUNTER — Other Ambulatory Visit: Payer: Commercial Managed Care - HMO

## 2014-06-21 ENCOUNTER — Ambulatory Visit: Payer: Commercial Managed Care - HMO

## 2014-06-21 NOTE — Telephone Encounter (Signed)
Rx sent to pt's local pharmacy

## 2014-06-23 ENCOUNTER — Other Ambulatory Visit: Payer: Self-pay | Admitting: Endocrinology

## 2014-06-25 ENCOUNTER — Ambulatory Visit: Payer: Commercial Managed Care - HMO

## 2014-06-30 ENCOUNTER — Ambulatory Visit: Payer: Commercial Managed Care - HMO | Admitting: Podiatry

## 2014-07-06 ENCOUNTER — Ambulatory Visit (INDEPENDENT_AMBULATORY_CARE_PROVIDER_SITE_OTHER): Payer: Commercial Managed Care - HMO | Admitting: Internal Medicine

## 2014-07-06 ENCOUNTER — Encounter: Payer: Self-pay | Admitting: Internal Medicine

## 2014-07-06 ENCOUNTER — Ambulatory Visit (HOSPITAL_COMMUNITY)
Admission: RE | Admit: 2014-07-06 | Discharge: 2014-07-06 | Disposition: A | Discharge status: Home / Self Care | Payer: Commercial Managed Care - HMO | Source: Ambulatory Visit | Attending: Internal Medicine | Admitting: Internal Medicine

## 2014-07-06 ENCOUNTER — Encounter (INDEPENDENT_AMBULATORY_CARE_PROVIDER_SITE_OTHER): Payer: Self-pay

## 2014-07-06 VITALS — BP 120/58 | HR 77 | Ht 69.0 in | Wt 171.0 lb

## 2014-07-06 DIAGNOSIS — R0602 Shortness of breath: Secondary | ICD-10-CM | POA: Insufficient documentation

## 2014-07-06 LAB — PULMONARY FUNCTION TEST
DL/VA % pred: 81 %
DL/VA: 3.7 ml/min/mmHg/L
DLCO COR: 14.78 ml/min/mmHg
DLCO UNC % PRED: 42 %
DLCO cor % pred: 47 %
DLCO unc: 13.14 ml/min/mmHg
FEF 25-75 POST: 0.79 L/s
FEF 25-75 PRE: 1.12 L/s
FEF2575-%Change-Post: -29 %
FEF2575-%PRED-PRE: 60 %
FEF2575-%Pred-Post: 42 %
FEV1-%Change-Post: -4 %
FEV1-%Pred-Post: 53 %
FEV1-%Pred-Pre: 55 %
FEV1-POST: 1.44 L
FEV1-Pre: 1.51 L
FEV1FVC-%CHANGE-POST: 8 %
FEV1FVC-%Pred-Pre: 103 %
FEV6-%CHANGE-POST: -12 %
FEV6-%PRED-POST: 50 %
FEV6-%Pred-Pre: 57 %
FEV6-PRE: 2.07 L
FEV6-Post: 1.81 L
FEV6FVC-%Change-Post: 0 %
FEV6FVC-%PRED-POST: 107 %
FEV6FVC-%Pred-Pre: 107 %
FVC-%CHANGE-POST: -12 %
FVC-%PRED-POST: 47 %
FVC-%Pred-Pre: 53 %
FVC-POST: 1.81 L
FVC-PRE: 2.07 L
POST FEV6/FVC RATIO: 100 %
PRE FEV1/FVC RATIO: 73 %
Post FEV1/FVC ratio: 79 %
Pre FEV6/FVC Ratio: 100 %
RV % pred: 99 %
RV: 2.59 L
TLC % pred: 68 %
TLC: 4.68 L

## 2014-07-06 MED ORDER — ALBUTEROL SULFATE (2.5 MG/3ML) 0.083% IN NEBU
2.5000 mg | INHALATION_SOLUTION | Freq: Once | RESPIRATORY_TRACT | Status: AC
  Administered 2014-07-06: 2.5 mg via RESPIRATORY_TRACT

## 2014-07-06 NOTE — Patient Instructions (Signed)
Continue Pantoprazole (protonix) 40 mg   Take  30-60 min before first meal of the day and Pepcid (famotidine)  20 mg one @  Bedtime   GERD (REFLUX)  is an extremely common cause of respiratory symptoms just like yours , many times with no obvious heartburn at all.    It can be treated with medication, but also with lifestyle changes including elevation of the head of your bed (ideally with 6 inch  bed blocks),  Smoking cessation, avoidance of late meals, excessive alcohol, and avoid fatty foods, chocolate, peppermint, colas, red wine, and acidic juices such as orange juice.  NO MINT OR MENTHOL PRODUCTS SO NO COUGH DROPS  USE SUGARLESS CANDY INSTEAD (Jolley ranchers or Stover's or Life Savers) or even ice chips will also do - the key is to swallow to prevent all throat clearing. NO OIL BASED VITAMINS - use powdered substitutes.   Continue bipap at night and also during the day at home if needed  You do not appear to have any kind of a lung problem as every abnormality on your lung function can be attributed to your muscle problem  Pulmonary follow up is as needed

## 2014-07-06 NOTE — Assessment & Plan Note (Addendum)
Diagnosed  [name] Tooth Disease in 1982 (following RLE fracture). Was following with Dr. Billy Fischeravid Simonds here at [facility] and was told in 1999/2000 that his "diaphragms were dead". Has been on BiPAP since 1999.  - Fluoro 03/29/1998 FLUOROSCOPY OF THE HEMIDIAPHRAGMS DEMONSTRATES SYMMETRICAL MOVEMENT OF THE DIAPHRAGMS. HOWEVER, THERE IS VERY LITTLE VERTICAL MOTION OF THE DIAPHRAGMS WITH INSPIRATION. IMPRESSION   - 05/25/2014   Walked RA x one lap @ 185 stopped due  legs weak and  Sob @  slow pace / no desats - 07/06/2014   Walked RA x one lap @ 185 stopped due to  Fatigue > sob and no desat at slow pace /legs gave out first. - PFTs  07/06/2014   FEV 1.44 (53%) Ratio 79  DLCO 42 corrects to 81% / MIP and MEP reduced to 25% of nl    I had an extended final summary discussion with the patient and daughter reviewing all relevant studies completed to date and  lasting 15 to 20 minutes of a 25 minute visit on the following issues:    1) no evidence of a lung problem here > his sob and MP/MEP are all related to his CMT / MD > [facility]  2) voice can be related to CMT or gerd / not compliant with diet > re-instructed  3) pulmonary f/u is prn but he will need to start doing some end of life discussion with neurology or see pulmonary there re home vent management (he doesn' t strike me as a likely to thrive on a vent based on my two visits with him and as long as does well on bipap this is a moot issue but will need to be addressed by the specialist for the problme that's progressive/ causative > [facility]

## 2014-07-06 NOTE — Progress Notes (Signed)
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 Was following with Dr. Billy Fischer here at [facility] and was told in 1999/2000 that his "diaphragms were dead". Has been on BiPAP since 1999. cpst c/w muscle weakness and ? Asthma component  rec qvar but did not use it   Admitted [facility] for HCAP and d/c 05/03/14    05/25/2014 1st  Pulmonary office visit/ Sherene Sires / consultation per Dr Everardo All p adm  Chief Complaint  Patient presents with  . Pulmonary Consult    Referred by Dr. Romero Belling. Pt c/o SOB walking "not very far"- has had PNA recently.   on bipap per [facility] neurology dept yearly f/u yearly s respiratory problems / sorethroat in am  No problem sitting but running out of air talking Still walking use [facility] parking / does ok room to room in mobile home/ no longer shopping  rec Pantoprazole (protonix) 40 mg   Take  30-60 min before first meal of the day and Pepcid (famotidine)  20 mg one @  bedtime until return to office - this is the best way to tell whether stomach acid is contributing to your problem.  GERD  Diet      07/06/2014 f/u ov/Burr Soffer re: CMT MD / bipap at [facility] per [facility] neurology / not following diet / purely muscle weakness changes on pfts today  Chief Complaint  Patient presents with  . Follow-up    PFT done today. Pt states breathing is progressively worse since the last visit.   doe x 150 ft at most/ slow pace/ voice getting worse / ok sitting still and sleeping on bipap   No obvious day to day or daytime variabilty or assoc chronic cough or cp or chest tightness, subjective wheeze overt sinus or hb symptoms. No unusual exp hx or h/o  childhood pna/ asthma or knowledge of premature birth.  Sleeping ok without nocturnal  or early am exacerbation  of respiratory  c/o's or need for noct saba. Also denies any obvious fluctuation of symptoms with weather or environmental changes or other aggravating or alleviating factors except as outlined above   Current Medications, Allergies, Complete Past Medical History, Past Surgical History, Family History, and Social History were reviewed in [employer] record.  ROS  The following are not active complaints unless bolded sore throat, dysphagia, dental problems, itching, sneezing,  nasal congestion or excess/ purulent secretions, ear ache,   fever, chills, sweats, unintended wt loss, pleuritic or exertional cp, hemoptysis,  orthopnea pnd or leg swelling, presyncope, palpitations, abdominal pain, anorexia, nausea, vomiting, diarrhea  or change in bowel or urinary habits, change in stools or urine, dysuria,hematuria,  rash, arthralgias, visual complaints, headache, numbness weakness or ataxia or problems with walking or coordination,  change in mood/affect or memory.                      Objective:   Physical Exam  amb wm wearing foot braces with classic voice fatigue/ still chewing mint gum   07/06/2014      Wt Readings from Last 3 Encounters:  05/25/14  183 lb (83.008 kg)  05/05/14 174 lb (78.926 kg)  02/09/14 191 lb (86.637 kg)    Vital signs reviewed  HEENT: full dentures in place/ nl turbinates, and orophanx. Nl external ear canals without cough reflex   NECK :  without JVD/Nodes/TM/ nl carotid upstrokes bilaterally   LUNGS: no acc muscle use, clear to A and P bilaterally without cough on insp or exp maneuvers   CV:  RRR  no s3 or murmur or increase in P2, no edema   ABD:  soft and nontender with limited excursion. No bruits or organomegaly, bowel sounds nl  MS:  warm without deformities, calf tenderness, cyanosis or clubbing  SKIN: warm and dry  without lesions    NEURO:  alert, approp, no deficits but wearing bilateral foot /ankle braces      I personally reviewed images and agree with radiology impression as follows:  CXR:  05/05/14 COPD and evidence of previous granulomatous infection. Stable scarring at the right lung base may be related to the known asbestos exposure. No active cardiopulmonary disease is evident.       Assessment & Plan:

## 2014-07-21 ENCOUNTER — Encounter: Payer: Self-pay | Admitting: Podiatry

## 2014-07-21 ENCOUNTER — Ambulatory Visit (INDEPENDENT_AMBULATORY_CARE_PROVIDER_SITE_OTHER): Payer: Commercial Managed Care - HMO | Admitting: Podiatry

## 2014-07-21 VITALS — BP 130/72 | HR 65 | Resp 18

## 2014-07-21 DIAGNOSIS — E1140 Type 2 diabetes mellitus with diabetic neuropathy, unspecified: Secondary | ICD-10-CM | POA: Diagnosis not present

## 2014-07-21 DIAGNOSIS — Q828 Other specified congenital malformations of skin: Secondary | ICD-10-CM

## 2014-07-21 DIAGNOSIS — L89891 Pressure ulcer of other site, stage 1: Secondary | ICD-10-CM | POA: Diagnosis not present

## 2014-07-21 DIAGNOSIS — L989 Disorder of the skin and subcutaneous tissue, unspecified: Secondary | ICD-10-CM

## 2014-07-21 DIAGNOSIS — G600 Hereditary motor and sensory neuropathy: Secondary | ICD-10-CM

## 2014-07-21 NOTE — Progress Notes (Signed)
Subjective:     Patient ID: [name], male   DOB: September 12, 1931, 81 y.o.   MRN: [id number]  HPIThis patient presents with significant callus under big toe joint left foot.  He has hx of CMT and walks with braces.  He says he lacks feeling in his feet and this callus occasionally breaks down.  He presents for evaluation and treatment.   Review of Systems     Objective:   Physical Exam GENERAL APPEARANCE: Alert, conversant. Appropriately groomed. No acute distress.  VASCULAR: Pedal pulses palpable but diminished both feet..  Capillary refill time is two seconds  to all digits,  .  Digital hair growth is absent bilateral  NEUROLOGIC: sensation is absent  epicritically and protectively to 5.07 monofilament at 5/5 sites bilateral.  Light touch is intact bilateral,    MUSCULOSKELETAL: acceptable muscle strength, tone and stability bilateral.  Intrinsic muscluature intact bilateral.  Rectus appearance of foot and digits noted bilateral.   DERMATOLOGIC: skin color, texture, and turgor are within normal limits.  preulcerative lesions or ulcers  are seen under first MPJ left foot., no interdigital maceration noted.  No open lesions present.  Digital nails are asymptomatic. No drainage noted.No pathology right foot.     Assessment:     Pre ulcerous callus sub1st MPJ left foot.     Plan:     Debridement of callus left foot.

## 2014-07-28 DIAGNOSIS — M25552 Pain in left hip: Secondary | ICD-10-CM | POA: Diagnosis not present

## 2014-08-19 ENCOUNTER — Ambulatory Visit: Payer: Commercial Managed Care - HMO | Admitting: Endocrinology

## 2014-09-21 ENCOUNTER — Encounter: Payer: Self-pay | Admitting: Endocrinology

## 2014-09-21 ENCOUNTER — Ambulatory Visit (INDEPENDENT_AMBULATORY_CARE_PROVIDER_SITE_OTHER): Payer: Commercial Managed Care - HMO | Admitting: Endocrinology

## 2014-09-21 VITALS — BP 130/72 | HR 60 | Temp 98.2°F | Ht 69.0 in | Wt 181.0 lb

## 2014-09-21 DIAGNOSIS — D509 Iron deficiency anemia, unspecified: Secondary | ICD-10-CM

## 2014-09-21 DIAGNOSIS — G600 Hereditary motor and sensory neuropathy: Secondary | ICD-10-CM | POA: Diagnosis not present

## 2014-09-21 DIAGNOSIS — E1142 Type 2 diabetes mellitus with diabetic polyneuropathy: Secondary | ICD-10-CM

## 2014-09-21 DIAGNOSIS — Z23 Encounter for immunization: Secondary | ICD-10-CM | POA: Diagnosis not present

## 2014-09-21 LAB — POCT GLYCOSYLATED HEMOGLOBIN (HGB A1C): Hemoglobin A1C: 5.9

## 2014-09-21 MED ORDER — CYANOCOBALAMIN 1000 MCG/ML IJ SOLN
1000.0000 ug | Freq: Once | INTRAMUSCULAR | Status: AC
  Administered 2014-09-21: 1000 ug via INTRAMUSCULAR

## 2014-09-21 MED ORDER — METFORMIN HCL ER 500 MG PO TB24: 1000.0000 mg | ORAL_TABLET | Freq: Every day | ORAL | Status: DC

## 2014-09-21 MED ORDER — TRAMADOL HCL 50 MG PO TABS: 50.0000 mg | ORAL_TABLET | Freq: Four times a day (QID) | ORAL | Status: DC | PRN

## 2014-09-21 NOTE — Progress Notes (Signed)
Subjective:    Patient ID: [name], male    DOB: 1931/12/30, 81 y.o.   MRN: [id number]  HPI The state of at least three ongoing medical problems is addressed today, with interval history of each noted here: Pt returns for f/u of diabetes mellitus: DM type: 2 Dx'ed: 2008 Complications: CAD Therapy: metformin. DKA: never Severe hypoglycemia: never Pancreatitis: never Other: he has never been on insulin Interval history: no cbg record, but states cbg's are well-controlled.   He has lost a few more lbs chronic leg weakness is slightly worse.  He has fallen several times.   Past Medical History  Diagnosis Date  . Coronary artery disease   . Leukopenia   . Smoker     quit 1968--25 ppy  . H/O: asbestos exposure   . Pernicious anemia   . ANEMIA, IRON DEFICIENCY   . COPD (chronic obstructive pulmonary disease)   . BACK PAIN, LUMBAR   . Hypertension   . GERD (gastroesophageal reflux disease)   . DIABETES MELLITUS, TYPE II   . Charcot-Marie-Tooth disease     assoiciated muscular dystrophy     Past Surgical History  Procedure Laterality Date  . Coronary artery bypass graft  1995    Social History   Social History  . Marital Status: Widowed    Spouse Name: N/A  . Number of Children: N/A  . Years of Education: N/A   Occupational History  . Retired     Games developer   Social History Main Topics  . Smoking status: Former Smoker -- 1.00 packs/day for 20 years    Types: Cigarettes    Quit date: 01/01/1966  . Smokeless tobacco: Never Used  . Alcohol Use: No  . Drug Use: No  . Sexual Activity: Not on file   Other Topics Concern  . Not on file   Social History Narrative   Was in service during [place] War          Current Outpatient Prescriptions on File Prior to Visit  Medication Sig Dispense Refill  . Ascorbic Acid (VITAMIN C) 500 MG tablet Take 500 mg by mouth 2 (two) times daily.     Marland Kitchen aspirin 325 MG tablet Take 325 mg by mouth daily.      . Blood  Glucose Calibration (ADVOCATE REDI-CODE+ CONTROL) LOW SOLN   3  . Blood Glucose Monitoring Suppl (ADVOCATE REDI-CODE+) DEVI   1  . CLEVER CHEK AUTO-CODE VOICE test strip     . clotrimazole-betamethasone (LOTRISONE) cream Apply topically 2 (two) times daily as needed.     . Cyanocobalamin (VITAMIN B-12 IJ) Inject as directed. GET A B12 INJECTION ONCE EVERY MONTH    . cyanocobalamin 1000 MCG tablet Take 500 mcg by mouth 2 (two) times daily.     . famotidine (PEPCID) 20 MG tablet One at bedtime 30 tablet 2  . Inositol Niacinate (NIACIN FLUSH FREE) 500 MG CAPS Take by mouth.    Demetra Shiner Devices (SIMPLE DIAGNOSTICS LANCING DEV) MISC   1  . montelukast (SINGULAIR) 10 MG tablet     . Multiple Vitamins-Minerals (MULTIVITAMIN WITH MINERALS) tablet Take 1 tablet by mouth daily.      . pantoprazole (PROTONIX) 40 MG tablet Take 1 tablet (40 mg total) by mouth daily. Take 30-60 min before first meal of the day 30 tablet 2  . sertraline (ZOLOFT) 50 MG tablet Take 1 tablet (50 mg total) by mouth daily. 90 tablet 3  . simvastatin (ZOCOR) 80 MG  tablet Take 1 tablet (80 mg total) by mouth at bedtime. 90 tablet 3  . Zinc 50 MG CAPS Take 1 capsule by mouth.      . nitroGLYCERIN (NITROSTAT) 0.4 MG SL tablet Place 0.4 mg under the tongue every 5 (five) minutes as needed.     No current facility-administered medications on file prior to visit.    No Known Allergies  Family History  Problem Relation Age of Onset  . Charcot-Marie-Tooth disease Sister   . Charcot-Marie-Tooth disease Brother   . Charcot-Marie-Tooth disease Brother   . Charcot-Marie-Tooth disease Brother   . Charcot-Marie-Tooth disease Sister   . Charcot-Marie-Tooth disease Son   . Charcot-Marie-Tooth disease Other   . COPD Daughter     never smoker    BP 130/72 mmHg  Pulse 60  Temp(Src) 98.2 F (36.8 C) (Oral)  Ht  (1.753 m)  Wt 181 lb (82.101 kg)  BMI 26.72 kg/m2  SpO2 97%  Review of Systems Denies BRBPR and hematuria.      Objective:   Physical Exam VITAL SIGNS:  See vs page GENERAL: no distress ABDOMEN: abdomen is soft, nontender.  no hepatosplenomegaly.  not distended.  no hernia  Lab Results  Component Value Date   HGBA1C 5.9 09/21/2014       Assessment & Plan:  Weight loss, persistent, but has slowed.  W/u was neg.  We'll follow for now.   DM: well-controlled, but metformin may contribute to weight loss. Leg weakness, new to me, ? related to CMT.   Patient is advised the following: Patient Instructions  Please come back for a regular physical appointment in 4 months.   We'll follow your weight for now.   Please reduce the metformin.  i have sent a prescription to your pharmacy. Please see a specialist for the leg weakness.  you will receive a phone call, about a day and time for an appointment.

## 2014-09-21 NOTE — Patient Instructions (Addendum)
Please come back for a regular physical appointment in 4 months.   We'll follow your weight for now.   Please reduce the metformin.  i have sent a prescription to your pharmacy. Please see a specialist for the leg weakness.  you will receive a phone call, about a day and time for an appointment.

## 2014-09-23 ENCOUNTER — Telehealth: Payer: Self-pay | Admitting: *Deleted

## 2014-09-23 NOTE — Telephone Encounter (Signed)
Pt on wait list for 12 month follow up appointment with Dr. Clifton James.  Dr. Clifton James has openings at 3:15 or 3:30 on 9/23.  I placed call to pt to see if he would like one of these appt times.  Left message to call back.

## 2014-09-30 NOTE — Telephone Encounter (Signed)
Spoke with pt and appt made for him to see Dr. Clifton James on September 30,2016 at 2:15

## 2014-10-01 ENCOUNTER — Encounter: Payer: Self-pay | Admitting: Cardiovascular Disease

## 2014-10-01 ENCOUNTER — Ambulatory Visit (INDEPENDENT_AMBULATORY_CARE_PROVIDER_SITE_OTHER): Payer: Commercial Managed Care - HMO | Admitting: Cardiovascular Disease

## 2014-10-01 VITALS — BP 130/50 | HR 60 | Ht 69.0 in | Wt 182.0 lb

## 2014-10-01 DIAGNOSIS — I25119 Atherosclerotic heart disease of native coronary artery with unspecified angina pectoris: Secondary | ICD-10-CM

## 2014-10-01 DIAGNOSIS — I471 Supraventricular tachycardia: Secondary | ICD-10-CM | POA: Diagnosis not present

## 2014-10-01 DIAGNOSIS — I10 Essential (primary) hypertension: Secondary | ICD-10-CM | POA: Diagnosis not present

## 2014-10-01 DIAGNOSIS — F17201 Nicotine dependence, unspecified, in remission: Secondary | ICD-10-CM | POA: Diagnosis not present

## 2014-10-01 NOTE — Progress Notes (Signed)
Chief Complaint  Patient presents with  . Follow-up    yearly visit    History of Present Illness: 81 yo male with history of CAD with 5V CABG in 1995 with most recent relook cath 2006, DM, HTN, former smoker who is here today for cardiac follow up. He had been followed in the past by Dr. Olevia Perches. I met him in April 2012 and he had c/o chest pains. Stress myoview April 2012 with no ischemia. I saw him 12/24/12 and there was question of atrial fibrillation in primary care. I arranged a 48 hour monitor which showed sinus rhythm with frequent PACs, several runs of SVT. Echo 01/12/13 with normal LV size and function, mild AI and TR. Probable mild pulm HTN. He could not lay flat for stress myoview images due to diaphragm issues, chronic problem. Seen by Dr. Melvyn Novas and note states he has no lung issues. He was treated for GERD.   He is here today for follow up. He tells me that he has been feeling well. He has stable angina with episodes of chest pain once every 3-4 weeks. Lasts for 2-3 minutes. He reports constant SOB secondary to his Charcot Marie Tooth syndrome. No changes since last visit. No awareness of palpitations. No smoking.   Primary Care Physician: Loanne Drilling  Last Lipid Profile:Lipid Panel     Component Value Date/Time   CHOL 127 12/16/2013 1401   TRIG 86.0 12/16/2013 1401   HDL 45.90 12/16/2013 1401   CHOLHDL 3 12/16/2013 1401   VLDL 17.2 12/16/2013 1401   LDLCALC 64 12/16/2013 1401     Past Medical History  Diagnosis Date  . Coronary artery disease   . Leukopenia   . Smoker     quit 1968--25 ppy  . H/O: asbestos exposure   . Pernicious anemia   . ANEMIA, IRON DEFICIENCY   . COPD (chronic obstructive pulmonary disease)   . BACK PAIN, LUMBAR   . Hypertension   . GERD (gastroesophageal reflux disease)   . DIABETES MELLITUS, TYPE II   . Charcot-Marie-Tooth disease     assoiciated muscular dystrophy     Past Surgical History  Procedure Laterality Date  . Coronary artery  bypass graft  1995    Current Outpatient Prescriptions  Medication Sig Dispense Refill  . Ascorbic Acid (VITAMIN C) 500 MG tablet Take 500 mg by mouth 2 (two) times daily.     Marland Kitchen aspirin 325 MG tablet Take 325 mg by mouth daily.      . Blood Glucose Calibration (ADVOCATE REDI-CODE+ CONTROL) LOW SOLN   3  . Blood Glucose Monitoring Suppl (ADVOCATE REDI-CODE+) DEVI   1  . CLEVER CHEK AUTO-CODE VOICE test strip     . clotrimazole-betamethasone (LOTRISONE) cream Apply topically 2 (two) times daily as needed.     . Cyanocobalamin (VITAMIN B-12 IJ) Inject as directed. GET A B12 INJECTION ONCE EVERY MONTH    . cyanocobalamin 1000 MCG tablet Take 500 mcg by mouth 2 (two) times daily.     . famotidine (PEPCID) 20 MG tablet One at bedtime 30 tablet 2  . Inositol Niacinate (NIACIN FLUSH FREE) 500 MG CAPS Take by mouth.    Elmore Guise Devices (SIMPLE DIAGNOSTICS LANCING DEV) MISC   1  . metFORMIN (GLUCOPHAGE-XR) 500 MG 24 hr tablet Take 2 tablets (1,000 mg total) by mouth daily. 180 tablet 3  . montelukast (SINGULAIR) 10 MG tablet     . Multiple Vitamins-Minerals (MULTIVITAMIN WITH MINERALS) tablet Take 1 tablet by  mouth daily.      . pantoprazole (PROTONIX) 40 MG tablet Take 1 tablet (40 mg total) by mouth daily. Take 30-60 min before first meal of the day 30 tablet 2  . sertraline (ZOLOFT) 50 MG tablet Take 1 tablet (50 mg total) by mouth daily. 90 tablet 3  . simvastatin (ZOCOR) 80 MG tablet Take 1 tablet (80 mg total) by mouth at bedtime. 90 tablet 3  . traMADol (ULTRAM) 50 MG tablet Take 1 tablet (50 mg total) by mouth every 6 (six) hours as needed. 270 tablet 1  . Zinc 50 MG CAPS Take 1 capsule by mouth.      . nitroGLYCERIN (NITROSTAT) 0.4 MG SL tablet Place 0.4 mg under the tongue every 5 (five) minutes as needed.     No current facility-administered medications for this visit.    No Known Allergies  Social History   Social History  . Marital Status: Widowed    Spouse Name: N/A  . Number  of Children: N/A  . Years of Education: N/A   Occupational History  . Retired     Engineer, building services   Social History Main Topics  . Smoking status: Former Smoker -- 1.00 packs/day for 20 years    Types: Cigarettes    Quit date: 01/01/1966  . Smokeless tobacco: Never Used  . Alcohol Use: No  . Drug Use: No  . Sexual Activity: Not on file   Other Topics Concern  . Not on file   Social History Narrative   Was in service during [place] War          Family History  Problem Relation Age of Onset  . Charcot-Marie-Tooth disease Sister   . Charcot-Marie-Tooth disease Brother   . Charcot-Marie-Tooth disease Brother   . Charcot-Marie-Tooth disease Brother   . Charcot-Marie-Tooth disease Sister   . Charcot-Marie-Tooth disease Son   . Charcot-Marie-Tooth disease Other   . COPD Daughter     never smoker    Review of Systems:  As stated in the HPI and otherwise negative.   BP 130/50 mmHg  Pulse 60  Ht _0  (1.753 m)  Wt 182 lb (82.555 kg)  BMI 26.86 kg/m2  Physical Examination: General: Well developed, well nourished, NAD HEENT: OP clear, mucus membranes moist SKIN: warm, dry. No rashes. Neuro: No focal deficits Musculoskeletal: Muscle strength 5/5 all ext Psychiatric: Mood and affect normal Neck: No JVD, no carotid bruits, no thyromegaly, no lymphadenopathy. Lungs:Clear bilaterally, no wheezes, rhonci, crackles Cardiovascular: Regular rate and rhythm. No murmurs, gallops or rubs. Abdomen:Soft. Bowel sounds present. Non-tender.  Extremities: No lower extremity edema. Pulses are 2 + in the bilateral DP/PT.  Echo 01/12/13: Left ventricle: The cavity size was normal. There was mild focal basal hypertrophy of the septum. Systolic function was normal. The estimated ejection fraction was in the range of 55% to 60%. Wall motion was normal; there were no regional wall motion abnormalities. - Aortic valve: Mild regurgitation. - Left atrium: The atrium was mildly dilated. -  Right ventricle: The cavity size was mildly dilated. Wall thickness was normal. - Tricuspid valve: Mild-moderate regurgitation. - Pulmonary arteries: PA peak pressure: 92m Hg (S). Impressions:  - The right ventricular systolic pressure was increased consistent with mild pulmonary hypertension.  EKG:  EKG is not ordered today. The ekg ordered today demonstrates   Recent Labs: 12/16/2013: ALT 19; TSH 1.67 05/05/2014: BUN 23; Creatinine, Ser 0.77; Potassium 4.8; Sodium 138 06/11/2014: Hemoglobin 11.2*; Platelets 195.0   Lipid Panel  Component Value Date/Time   CHOL 127 12/16/2013 1401   TRIG 86.0 12/16/2013 1401   HDL 45.90 12/16/2013 1401   CHOLHDL 3 12/16/2013 1401   VLDL 17.2 12/16/2013 1401   LDLCALC 64 12/16/2013 1401     Wt Readings from Last 3 Encounters:  10/01/14 182 lb (82.555 kg)  09/21/14 181 lb (82.101 kg)  07/06/14 171 lb (77.565 kg)     Other studies Reviewed: Additional studies/ records that were reviewed today include: . Review of the above records demonstrates:    Assessment and Plan:   1. CAD: he is s/p CABG in 1995. He has stable angina. He could not lay flat for the stress test images. This is a chronic issue due to his diaphragm issues. Continue ASA and statin. No beta blocker with bradycardia.   2. SVT: Rare palpitations. Event monitor had shown bradycardia and several runs SVT.  Will not start a rate control agent at this time since he feels well and I do not want to drop his heart rate any further. If he has symptoms from SVT, will need EP referral as a pacemaker would likely be needed for rate backup if medical therapy is pursued to rate control.   3. Tobacco abuse, in remission: He has not started back smoking.   4. HTN: BP controlled. No changes.   Current medicines are reviewed at length with the patient today.  The patient does not have concerns regarding medicines.  The following changes have been made:  no change  Labs/ tests ordered  today include:  No orders of the defined types were placed in this encounter.    Disposition:   FU with me in 12  months  Signed, Lauree Chandler, MD 10/01/2014 2:53 PM    [facility] [address], [place], [place]  [address] Phone: [phone]; Fax: [phone]

## 2014-10-01 NOTE — Patient Instructions (Signed)

## 2014-10-07 ENCOUNTER — Ambulatory Visit: Payer: Commercial Managed Care - HMO | Admitting: Neurology

## 2014-10-11 ENCOUNTER — Ambulatory Visit (INDEPENDENT_AMBULATORY_CARE_PROVIDER_SITE_OTHER): Payer: Commercial Managed Care - HMO | Admitting: Podiatry

## 2014-10-11 ENCOUNTER — Encounter: Payer: Self-pay | Admitting: Podiatry

## 2014-10-11 VITALS — BP 137/76 | HR 64 | Resp 14

## 2014-10-11 DIAGNOSIS — M79675 Pain in left toe(s): Secondary | ICD-10-CM | POA: Diagnosis not present

## 2014-10-11 DIAGNOSIS — L89891 Pressure ulcer of other site, stage 1: Secondary | ICD-10-CM | POA: Diagnosis not present

## 2014-10-11 DIAGNOSIS — B351 Tinea unguium: Secondary | ICD-10-CM

## 2014-10-11 DIAGNOSIS — L989 Disorder of the skin and subcutaneous tissue, unspecified: Secondary | ICD-10-CM

## 2014-10-11 NOTE — Progress Notes (Signed)
   Subjective:    Patient ID: [name], male    DOB: Apr 04, 1931, 81 y.o.   MRN: [id number]  HPIThis patient returns to the office saying he had an accident rising from bed which caused the bottom of his third toe left foot to bleed.  He says his daughter has been bandaging his toe but no soaks were performed.  His fourth toenail is also bloody but no pain noted.  He has preulcerous callus under the ball of his left foot with no drainage. He presents for evaluation and treatment.    Review of Systems  All other systems reviewed and are negative.      Objective:   Physical Exam Objective:   Physical Exam GENERAL APPEARANCE: Alert, conversant. Appropriately groomed. No acute distress.  VASCULAR: Pedal pulses palpable but diminished both feet.. Capillary refill time is two seconds to all digits, . Digital hair growth is absent bilateral  NEUROLOGIC: sensation is absent epicritically and protectively to 5.07 monofilament at 5/5 sites bilateral. Light touch is intact bilateral,  MUSCULOSKELETAL: acceptable muscle strength, tone and stability bilateral. Intrinsic muscluature intact bilateral. Rectus appearance of foot and digits noted bilateral.   DERMATOLOGIC: skin color, texture, and turgor are within normal limits. preulcerative lesions or ulcers are seen under first MPJ left foot., no interdigital maceration noted. No open lesions present. Digital nails are asymptomatic. No drainage noted.No pathology right foot. There is laceration under the third toe plantar sulcus left foot.  No signs of pus or infection noted.                Assessment & Plan:  Onychomycosis left foot.  Laceration third toe left foot.  Ulcer/callus sub 1st MPJ left foot.  Debride nails.  Debride ulcer sub 1st MPJ left foot.  Neosporin/DSD laceration site 3rd toe left foot. If this condition worsens or becomes very painful, the patient was told to contact this office or go to the Emergency  Department at the hospital.

## 2014-10-25 ENCOUNTER — Ambulatory Visit: Payer: Commercial Managed Care - HMO | Admitting: Podiatry

## 2014-11-15 ENCOUNTER — Encounter: Payer: Self-pay | Admitting: Neurology

## 2014-11-15 ENCOUNTER — Ambulatory Visit (INDEPENDENT_AMBULATORY_CARE_PROVIDER_SITE_OTHER): Payer: Commercial Managed Care - HMO | Admitting: Neurology

## 2014-11-15 ENCOUNTER — Other Ambulatory Visit (INDEPENDENT_AMBULATORY_CARE_PROVIDER_SITE_OTHER): Payer: Commercial Managed Care - HMO

## 2014-11-15 VITALS — BP 130/74 | HR 67 | Ht 69.0 in | Wt 188.1 lb

## 2014-11-15 DIAGNOSIS — G8220 Paraplegia, unspecified: Secondary | ICD-10-CM

## 2014-11-15 DIAGNOSIS — G600 Hereditary motor and sensory neuropathy: Secondary | ICD-10-CM

## 2014-11-15 DIAGNOSIS — R202 Paresthesia of skin: Secondary | ICD-10-CM

## 2014-11-15 DIAGNOSIS — M792 Neuralgia and neuritis, unspecified: Secondary | ICD-10-CM

## 2014-11-15 DIAGNOSIS — R209 Unspecified disturbances of skin sensation: Secondary | ICD-10-CM | POA: Diagnosis not present

## 2014-11-15 LAB — VITAMIN B12: VITAMIN B 12: 607 pg/mL (ref 211–911)

## 2014-11-15 MED ORDER — GABAPENTIN 300 MG PO CAPS: ORAL_CAPSULE | ORAL | Status: DC

## 2014-11-15 NOTE — Progress Notes (Signed)
[name] Note - Initial Visit   Date: 11/15/2014  COPELAN MAULTSBY MRN: [id number] DOB: 12-May-1931   Dear Dr. Jeanne Ivan:  Thank you for your kind referral of [name] for consultation of leg weakness and neuropathy. Although his history is well known to you, please allow [name] to reiterate it for the purpose of our medical record. The patient was accompanied to the clinic by daughter and girlfriend who also provides collateral information.     History of Present Illness: [name] is a 81 y.o. right-handed Caucasian male with GERD, diabetes mellitus, hyperlipidemia, depression, CAD, pancytopenia, and severe Charcot-Marie-Tooth type 1A disease presenting for evaluation of leg weakness.    He recalls having frequent falls which started in his late 75s, but that no one knew what his symptoms were due to.  In 1982, he fractured his left foot and went to see Dr. Montez Morita at [facility] who said he most likely has CMT.  Patient was diagnosed with Charcot-Marie-Tooth type 1A based on nerve conduction studies and genetic testing on hs grandson.  He is being followed by [facility] by Dr. [name].  Weakness started in his legs first and sooner involved his hands. He has severe bilateral foot drop and ambulates mostly with a cane. He has worn bilateral AFOs since 1982. He has a Press photographer but rarely uses, daughter says he is "stubborn".  He falls about once per month and has to find something to pull up on furniture to stand again. He has not had any significant injuries.  He does is own ADLs, including driving which he uses hand controls.  He has not been involved in MVAs.  He has difficulty with writing, walking, balance, and cleaning the home. He lives in a [place] home.  He does not have a medical alert system and is not interested in one.  He does not have any plans to move into an assisted living facility.    He also has shortness of  breath and uses an CPAP at night.  He is not followed by pulmonology any longer as per his wish.  He sleeps on three pillows, which has been stable.   He has a strong history of CMT which is passed from his father to 5 of 9 children and his nieces/nephews have it.    He was referred to see me by his PCP due to progressive weakness of the hands and feet.  He also complains of tingling sensation of the lower legs bilaterally, worse in the evening.  He stopped taking gabapentin because he ran out of it.  There is constant numbness of his legs and hands.  Out-side paper records, electronic medical record, and images have been reviewed where available and summarized as:  Lab Results  Component Value Date   HGBA1C 5.9 09/21/2014   Lab Results  Component Value Date   TSH 1.67 12/16/2013    Past Medical History  Diagnosis Date  . Coronary artery disease   . Leukopenia   . Smoker     quit 1968--25 ppy  . H/O: asbestos exposure   . Pernicious anemia   . ANEMIA, IRON DEFICIENCY   . COPD (chronic obstructive pulmonary disease) (HCC)   . BACK PAIN, LUMBAR   . Hypertension   . GERD (gastroesophageal reflux disease)   . DIABETES MELLITUS, TYPE II   . Charcot-Marie-Tooth disease     assoiciated muscular dystrophy     Past Surgical History  Procedure Laterality Date  . Coronary artery bypass graft  1995     Medications:  Outpatient Encounter Prescriptions as of 11/15/2014  Medication Sig Note  . Ascorbic Acid (VITAMIN C) 500 MG tablet Take 500 mg by mouth 2 (two) times daily.    Marland Kitchen aspirin 325 MG tablet Take 325 mg by mouth daily.     . Blood Glucose Calibration (ADVOCATE REDI-CODE+ CONTROL) LOW SOLN  11/13/2013: Received from: External Pharmacy  . Blood Glucose Monitoring Suppl (ADVOCATE REDI-CODE+) DEVI  11/13/2013: Received from: External Pharmacy  . CLEVER CHEK AUTO-CODE VOICE test strip  08/13/2013: Received from: External Pharmacy  . clotrimazole-betamethasone (LOTRISONE) cream  Apply topically 2 (two) times daily as needed.    . Cyanocobalamin (VITAMIN B-12 IJ) Inject as directed. GET A B12 INJECTION ONCE EVERY MONTH   . cyanocobalamin 1000 MCG tablet Take 500 mcg by mouth 2 (two) times daily.    . famotidine (PEPCID) 20 MG tablet One at bedtime   . gabapentin (NEURONTIN) 300 MG capsule Take 1 tablet at bedtime for one week, then increase to 2 tablets at bedtime   . Inositol Niacinate (NIACIN FLUSH FREE) 500 MG CAPS Take by mouth. 12/24/2012: Received [facility]: [name]  . Lancet Devices (SIMPLE DIAGNOSTICS LANCING DEV) MISC  11/13/2013: Received from: External Pharmacy  . metFORMIN (GLUCOPHAGE-XR) 500 MG 24 hr tablet Take 2 tablets (1,000 mg total) by mouth daily.   . montelukast (SINGULAIR) 10 MG tablet  07/21/2014: Received from: External Pharmacy  . Multiple Vitamins-Minerals (MULTIVITAMIN WITH MINERALS) tablet Take 1 tablet by mouth daily.     . nitroGLYCERIN (NITROSTAT) 0.4 MG SL tablet Place 0.4 mg under the tongue every 5 (five) minutes as needed.   . pantoprazole (PROTONIX) 40 MG tablet Take 1 tablet (40 mg total) by mouth daily. Take 30-60 min before first meal of the day   . sertraline (ZOLOFT) 50 MG tablet Take 1 tablet (50 mg total) by mouth daily.   . simvastatin (ZOCOR) 80 MG tablet Take 1 tablet (80 mg total) by mouth at bedtime.   . traMADol (ULTRAM) 50 MG tablet Take 1 tablet (50 mg total) by mouth every 6 (six) hours as needed.   . Zinc 50 MG CAPS Take 1 capsule by mouth.      No facility-administered encounter medications on file as of 11/15/2014.     Allergies: No Known Allergies  Family History: Family History  Problem Relation Age of Onset  . Charcot-Marie-Tooth disease Sister   . Charcot-Marie-Tooth disease Brother   . Charcot-Marie-Tooth disease Brother   . Charcot-Marie-Tooth disease Brother   . Charcot-Marie-Tooth disease Sister   . Charcot-Marie-Tooth disease Son   . Charcot-Marie-Tooth disease Other   . COPD  Daughter     never smoker    Social History: Social History  Substance Use Topics  . Smoking status: Former Smoker -- 1.00 packs/day for 20 years    Types: Cigarettes    Quit date: 01/01/1966  . Smokeless tobacco: Never Used  . Alcohol Use: No   Social History   Social History Narrative   Was in service during [place] War.  Lives alone in a one story home.  On disability.            Review of Systems:  CONSTITUTIONAL: No fevers, chills, night sweats, or weight loss.   EYES: No visual changes or eye pain ENT: No hearing changes.  No history of nose bleeds.   RESPIRATORY: No cough, wheezing +shortness  of breath.   CARDIOVASCULAR: Negative for chest pain, and palpitations.   GI: Negative for abdominal discomfort, blood in stools or black stools.  No recent change in bowel habits.   GU:  No history of incontinence.   MUSCLOSKELETAL: No history of joint pain or swelling.  No myalgias.   SKIN: Negative for lesions, rash, and itching.   HEMATOLOGY/ONCOLOGY: Negative for prolonged bleeding, bruising easily, and swollen nodes.  No history of cancer.   ENDOCRINE: Negative for cold or heat intolerance, polydipsia or goiter.   PSYCH:  +depression or anxiety symptoms.   NEURO: As Above.   Vital Signs:  BP 130/74 mmHg  Pulse 67  Ht  (1.753 m)  Wt 188 lb 1 oz (85.305 kg)  BMI 27.76 kg/m2  SpO2 99%   General Medical Exam:   General:  Well appearing, comfortable, sitting in wheelchair.   Eyes/ENT: see cranial nerve examination.   Neck: No masses appreciated.  Full range of motion without tenderness.  No carotid bruits. Respiratory:  Clear to auscultation, good air entry bilaterally.   Cardiac:  Regular rate and rhythm, no murmur.   Extremities:  Marked pes cavus bilaterally, there is flexion deformity of the hands bilaterally. Skin:  No rashes or lesions.  Neurological Exam: MENTAL STATUS including orientation to time, place, person, recent and remote memory, attention span  and concentration, language, and fund of knowledge is normal.  Speech is not dysarthric.  CRANIAL NERVES: II:  No visual field defects.  Unremarkable fundi.   III-IV-VI: Pupils equal round and reactive to light.  Normal conjugate, extra-ocular eye movements in all directions of gaze.  No nystagmus.  No ptosis.   V:  Normal facial sensation.     VII:  Normal facial symmetry and movements.  VIII:  Normal hearing and vestibular function.   IX-X:  Normal palatal movement.   XI:  Normal shoulder shrug and head rotation.   XII:  Normal tongue strength and range of motion, no deviation or fasciculation.  MOTOR:  Severe atrophy of the intrinsic hand muscles and lower extremity (TA > gastroc), as well as forearms.  No abnormal movements, No pronator drift.  Tone is reduced in the legs.   Neck flexion and extension 5-/5 Right Upper Extremity:    Left Upper Extremity:    Deltoid  4/5   Deltoid  4/5   Biceps  5-/5   Biceps  5-/5   Triceps  5-/5   Triceps  5-/5   Wrist extensors  4-/5   Wrist extensors  4-/5   Wrist flexors  3/5   Wrist flexors  3/5   Finger extensors  3/5   Finger extensors  3/5   Finger flexors  4-/5   Finger flexors  4-/5   Dorsal interossei  2/5   Dorsal interossei  2/5   Abductor pollicis  2/5   Abductor pollicis  2/5   Tone (Ashworth scale)  0  Tone (Ashworth scale)  0   Right Lower Extremity:    Left Lower Extremity:    Hip flexors  4/5   Hip flexors  4/5   Hip extensors  4/5   Hip extensors  4/5   Knee flexors  4/5   Knee flexors  4/5   Knee extensors  4/5   Knee extensors  4/5   Dorsiflexors  0/5   Dorsiflexors  0/5   Plantarflexors  0/5   Plantarflexors  0/5   Toe extensors  0/5   Toe extensors  0/5   Toe flexors  0/5   Toe flexors  0/5   Tone (Ashworth scale)  0  Tone (Ashworth scale)  0   MSRs:  Right                                                                 Left brachioradialis 2+  brachioradialis 2+  biceps 0  Biceps 0  triceps 0  triceps 0  Patellar  0  Patellar 0  ankle jerk 0  ankle jerk 0  Hoffman no  Hoffman no  plantar response mute  plantar response mute   SENSORY:  Sensation to all modalities is markedly reduced in the upper extremities at the MCP, intact at the elbow.  Pin prick, temperature, and vibration is absent distal to knees.  COORDINATION/GAIT: Normal finger-to- nose-finger.  Finger tapping is slowed due to imprecision from weakness.  Significantl bilateral foot drop, gait not tested due to weakness.   IMPRESSION: Mr. Ivor CostaBible is a 81 year-old gentleman with Charcot-Marie-Tooth type 1A referred for evaluation of progressive weakness.  He his exam significant distal muscle atrophy and weakness which is due to his known hereditary neuropathy.  Unfortunately, there is no curative treatment and management is symptomatic; further, his weakness will slowly progress which he is aware of.  Despite his physical limitations, he remains remarkably independent and lives alone although I am concerned about his safety with tasks such as driving even though he uses a hand control.  His family and patient feel that he drives safely and do not have any concerns.  He is starting to have some difficulty with IADLs such as cleaning and cooking.  Goal at this juncture is home safety and try to maintain is current level of functioning.     PLAN/RECOMMENDATIONS:  1.  Check zinc, vitamin B12, copper, ceruloplasmin to be sure there is nothing else that could be worsening neuropathy 2.  Start gabapentin 300mg  at bedtime for one week, then increase to 600mg  at bedtime 3.  Home health referral 4.  Home PT/OT 5.  Continue to wear AFOs, offered carbon fiber AFO for added comfort, but he is happy with his rigid ones  6.  Recommended follow-up with pulmonology, but he is not interested  7.  Fall precautions discussed 8.  Encouraged to set up POA and home Medical Alert System 9.  [facility] Dr. Alphonzo Dublinaress  I am happy to see  him back in clinic as needed   The duration of this appointment visit was 45 minutes of face-to-face time with the patient.  Greater than 50% of this time was spent in counseling, explanation of diagnosis, planning of further management, and coordination of care.   Thank you for allowing me to participate in patient's care.  If I can answer any additional questions, I would be pleased to do so.    Sincerely,    Donika K. Allena KatzPatel, DO

## 2014-11-15 NOTE — Patient Instructions (Addendum)
1.  Start gabapentin 300mg  at bedtime for one week, then increase to 600mg  (2 tablets). 2.  Home health referral 3.  Check blood work 4.  Return to clinic as needed  Life Alert Resources  Medical Alert Systems that can locate patients outside the home:  http://mobilealertsystems.com/ 907-017-17781-4355560780 http://www.lifelinesys.com/content/lifeline-products/get-life-gosafe  (979)224-19091-220-498-3479 www.verizonwireless.com/sure/ 617-193-91721-(805)156-2253  Medial Alert systems that link to smart phones:  http://www.lifelinesys.com/content/lifeline-products/response-app https://www.montgomery-brown.info/http://www.greatcall.com/medical-apps/Fivestar https://www.bond-cox.org/http://www.lifealert.com/EmergencyMobileApp.aspx  Alzheimer's Association GPS Tracker:  http://www.gould-leon.com/http://www.alz.org/comfortzone/ (620)[phone]

## 2014-11-17 LAB — CERULOPLASMIN: Ceruloplasmin: 21 mg/dL (ref 18–36)

## 2014-11-18 ENCOUNTER — Other Ambulatory Visit: Payer: Self-pay | Admitting: Endocrinology

## 2014-11-18 LAB — COPPER, SERUM: Copper: 86 ug/dL (ref 70–175)

## 2014-11-18 LAB — ZINC: Zinc: 84 ug/dL (ref 60–130)

## 2014-12-20 ENCOUNTER — Ambulatory Visit (INDEPENDENT_AMBULATORY_CARE_PROVIDER_SITE_OTHER): Payer: Commercial Managed Care - HMO | Admitting: Endocrinology

## 2014-12-20 ENCOUNTER — Encounter: Payer: Self-pay | Admitting: Endocrinology

## 2014-12-20 VITALS — BP 142/76 | HR 64 | Temp 98.6°F | Ht 69.0 in | Wt 189.0 lb

## 2014-12-20 DIAGNOSIS — E1142 Type 2 diabetes mellitus with diabetic polyneuropathy: Secondary | ICD-10-CM | POA: Diagnosis not present

## 2014-12-20 DIAGNOSIS — Z0001 Encounter for general adult medical examination with abnormal findings: Secondary | ICD-10-CM | POA: Diagnosis not present

## 2014-12-20 DIAGNOSIS — Z125 Encounter for screening for malignant neoplasm of prostate: Secondary | ICD-10-CM

## 2014-12-20 DIAGNOSIS — J309 Allergic rhinitis, unspecified: Secondary | ICD-10-CM | POA: Diagnosis not present

## 2014-12-20 DIAGNOSIS — R319 Hematuria, unspecified: Secondary | ICD-10-CM

## 2014-12-20 DIAGNOSIS — D61818 Other pancytopenia: Secondary | ICD-10-CM

## 2014-12-20 DIAGNOSIS — D509 Iron deficiency anemia, unspecified: Secondary | ICD-10-CM

## 2014-12-20 DIAGNOSIS — Z0000 Encounter for general adult medical examination without abnormal findings: Secondary | ICD-10-CM

## 2014-12-20 LAB — CBC WITH DIFFERENTIAL/PLATELET
BASOS ABS: 0.1 10*3/uL (ref 0.0–0.1)
Basophils Relative: 1 % (ref 0.0–3.0)
EOS ABS: 0.2 10*3/uL (ref 0.0–0.7)
Eosinophils Relative: 3 % (ref 0.0–5.0)
HEMATOCRIT: 33.8 % — AB (ref 39.0–52.0)
HEMOGLOBIN: 11.1 g/dL — AB (ref 13.0–17.0)
LYMPHS PCT: 17.6 % (ref 12.0–46.0)
Lymphs Abs: 1.2 10*3/uL (ref 0.7–4.0)
MCHC: 32.9 g/dL (ref 30.0–36.0)
MCV: 92.3 fl (ref 78.0–100.0)
Monocytes Absolute: 0.8 10*3/uL (ref 0.1–1.0)
Monocytes Relative: 12 % (ref 3.0–12.0)
Neutro Abs: 4.5 10*3/uL (ref 1.4–7.7)
Neutrophils Relative %: 66.4 % (ref 43.0–77.0)
Platelets: 189 10*3/uL (ref 150.0–400.0)
RBC: 3.67 Mil/uL — AB (ref 4.22–5.81)
RDW: 13.2 % (ref 11.5–15.5)
WBC: 6.7 10*3/uL (ref 4.0–10.5)

## 2014-12-20 LAB — MICROALBUMIN / CREATININE URINE RATIO
Creatinine,U: 101.5 mg/dL
MICROALB/CREAT RATIO: 1.4 mg/g (ref 0.0–30.0)
Microalb, Ur: 1.4 mg/dL (ref 0.0–1.9)

## 2014-12-20 LAB — URINALYSIS, ROUTINE W REFLEX MICROSCOPIC
BILIRUBIN URINE: NEGATIVE
KETONES UR: NEGATIVE
LEUKOCYTES UA: NEGATIVE
Nitrite: NEGATIVE
PH: 6.5 (ref 5.0–8.0)
Specific Gravity, Urine: 1.02 (ref 1.000–1.030)
TOTAL PROTEIN, URINE-UPE24: NEGATIVE
UROBILINOGEN UA: 0.2 (ref 0.0–1.0)
Urine Glucose: NEGATIVE
WBC, UA: NONE SEEN (ref 0–?)

## 2014-12-20 LAB — TSH: TSH: 2.23 u[IU]/mL (ref 0.35–4.50)

## 2014-12-20 LAB — BASIC METABOLIC PANEL
BUN: 21 mg/dL (ref 6–23)
CALCIUM: 9.1 mg/dL (ref 8.4–10.5)
CO2: 28 meq/L (ref 19–32)
CREATININE: 0.75 mg/dL (ref 0.40–1.50)
Chloride: 104 mEq/L (ref 96–112)
GFR: 106.22 mL/min (ref >60.00)
Glucose, Bld: 86 mg/dL (ref 70–99)
Potassium: 4.5 mEq/L (ref 3.5–5.1)
Sodium: 141 mEq/L (ref 135–145)

## 2014-12-20 LAB — IBC PANEL
Iron: 53 ug/dL (ref 42–165)
SATURATION RATIOS: 18.7 % — AB (ref 20.0–50.0)
TRANSFERRIN: 202 mg/dL — AB (ref 212.0–360.0)

## 2014-12-20 LAB — LIPID PANEL
CHOL/HDL RATIO: 3
Cholesterol: 113 mg/dL (ref 0–200)
HDL: 44.4 mg/dL (ref >39.00)
LDL CALC: 52 mg/dL (ref 0–99)
NONHDL: 68.83
Triglycerides: 83 mg/dL (ref 0.0–149.0)
VLDL: 16.6 mg/dL (ref 0.0–40.0)

## 2014-12-20 LAB — POCT GLYCOSYLATED HEMOGLOBIN (HGB A1C): Hemoglobin A1C: 5.9

## 2014-12-20 LAB — PSA: PSA: 2.05 ng/mL (ref 0.10–4.00)

## 2014-12-20 MED ORDER — CEFUROXIME AXETIL 250 MG PO TABS: 250.0000 mg | ORAL_TABLET | Freq: Two times a day (BID) | ORAL | Status: AC

## 2014-12-20 NOTE — Progress Notes (Signed)
Subjective:    Patient ID: [name], male    DOB: 1931/04/25, 81 y.o.   MRN: [id number]  HPI Pt is here for regular wellness examination, and is feeling pretty well in general, and says chronic med probs are stable, except as noted below. Past Medical History  Diagnosis Date  . Coronary artery disease   . Leukopenia   . Smoker     quit 1968--25 ppy  . H/O: asbestos exposure   . Pernicious anemia   . ANEMIA, IRON DEFICIENCY   . COPD (chronic obstructive pulmonary disease) (HCC)   . BACK PAIN, LUMBAR   . Hypertension   . GERD (gastroesophageal reflux disease)   . DIABETES MELLITUS, TYPE II   . Charcot-Marie-Tooth disease     assoiciated muscular dystrophy     Past Surgical History  Procedure Laterality Date  . Coronary artery bypass graft  1995    Social History   Social History  . Marital Status: Widowed    Spouse Name: N/A  . Number of Children: N/A  . Years of Education: N/A   Occupational History  . Retired     Games developerdiesel mechanic   Social History Main Topics  . Smoking status: Former Smoker -- 1.00 packs/day for 20 years    Types: Cigarettes    Quit date: 01/01/1966  . Smokeless tobacco: Never Used  . Alcohol Use: No  . Drug Use: No  . Sexual Activity: Not on file   Other Topics Concern  . Not on file   Social History Narrative   Was in service during [place].  Lives alone in a one story home.  On disability.            Current Outpatient Prescriptions on File Prior to Visit  Medication Sig Dispense Refill  . Ascorbic Acid (VITAMIN C) 500 MG tablet Take 500 mg by mouth 2 (two) times daily.     Marland Kitchen. aspirin 325 MG tablet Take 325 mg by mouth daily.      . Blood Glucose Calibration (ADVOCATE REDI-CODE+ CONTROL) LOW SOLN   3  . Blood Glucose Monitoring Suppl (ADVOCATE REDI-CODE+) DEVI   1  . CLEVER CHEK AUTO-CODE VOICE test strip     . clotrimazole-betamethasone (LOTRISONE) cream Apply topically 2 (two) times daily as needed.     . Cyanocobalamin  (VITAMIN B-12 IJ) Inject as directed. GET A B12 INJECTION ONCE EVERY MONTH    . cyanocobalamin 1000 MCG tablet Take 500 mcg by mouth 2 (two) times daily.     . famotidine (PEPCID) 20 MG tablet One at bedtime 30 tablet 2  . gabapentin (NEURONTIN) 300 MG capsule Take 1 tablet at bedtime for one week, then increase to 2 tablets at bedtime 60 capsule 11  . Inositol Niacinate (NIACIN FLUSH FREE) 500 MG CAPS Take by mouth.    Demetra Shiner. Lancet Devices (SIMPLE DIAGNOSTICS LANCING DEV) MISC   1  . metFORMIN (GLUCOPHAGE-XR) 500 MG 24 hr tablet Take 2 tablets (1,000 mg total) by mouth daily. 180 tablet 3  . montelukast (SINGULAIR) 10 MG tablet     . Multiple Vitamins-Minerals (MULTIVITAMIN WITH MINERALS) tablet Take 1 tablet by mouth daily.      . sertraline (ZOLOFT) 50 MG tablet TAKE 1 TABLET (50 MG TOTAL) BY MOUTH DAILY. 90 tablet 3  . simvastatin (ZOCOR) 80 MG tablet TAKE 1 TABLET AT BEDTIME 90 tablet 3  . traMADol (ULTRAM) 50 MG tablet Take 1 tablet (50 mg total) by mouth every  6 (six) hours as needed. 270 tablet 1  . Zinc 50 MG CAPS Take 1 capsule by mouth.      . nitroGLYCERIN (NITROSTAT) 0.4 MG SL tablet Place 0.4 mg under the tongue every 5 (five) minutes as needed.    . pantoprazole (PROTONIX) 40 MG tablet Take 1 tablet (40 mg total) by mouth daily. Take 30-60 min before first meal of the day (Patient not taking: Reported on 12/20/2014) 30 tablet 2   No current facility-administered medications on file prior to visit.    No Known Allergies  Family History  Problem Relation Age of Onset  . Charcot-Marie-Tooth disease Sister   . Charcot-Marie-Tooth disease Brother   . Charcot-Marie-Tooth disease Brother   . Charcot-Marie-Tooth disease Brother   . Charcot-Marie-Tooth disease Sister   . Charcot-Marie-Tooth disease Son   . Charcot-Marie-Tooth disease Other   . COPD Daughter     never smoker    BP 142/76 mmHg  Pulse 64  Temp(Src) 98.6 F (37 C) (Oral)  Ht  (1.753 m)  Wt 189 lb (85.73 kg)   BMI 27.90 kg/m2  SpO2 99%   Review of Systems  Constitutional:       He has regained some of the weight he lost  HENT: Negative for hearing loss.   Eyes: Negative for visual disturbance.  Respiratory: Negative for wheezing.   Cardiovascular: Negative for chest pain.  Endocrine: Positive for cold intolerance.  Genitourinary: Negative for hematuria and difficulty urinating.  Musculoskeletal: Negative for back pain.  Skin: Negative for rash.  Allergic/Immunologic: Positive for environmental allergies.  Neurological: Negative for syncope.  Psychiatric/Behavioral: Negative for sleep disturbance.       Objective:   Physical Exam VS: see vs page GEN: no distress NECK: supple, thyroid is not enlarged CHEST WALL: no deformity LUNGS: clear to auscultation BREASTS:  No gynecomastia CV: reg rate and rhythm, no murmur ABD: abdomen is soft, nontender.  no hepatosplenomegaly.  not distended.  no hernia GENITALIA/RECTAL/PROSTATE: declined.  MUSCULOSKELETAL: muscle bulk and strength are grossly normal.  no obvious joint swelling.  gait is steady with a cane EXTEMITIES: no deformity.  no edema.  Foot exam is declined PULSES: no carotid bruit NEURO:  cn 2-12 grossly intact.   readily moves all 4's.  sensation is intact to touch on the LE's, but decreased from normal SKIN:  Normal texture and temperature.  No rash or suspicious lesion is visible.   NODES:  None palpable at the neck PSYCH: alert, well-oriented.  Does not appear anxious nor depressed.       Assessment & Plan:  Wellness visit today, with problems stable, except as noted.    SEPARATE EVALUATION FOLLOWS--EACH PROBLEM HERE IS NEW, NOT RESPONDING TO TREATMENT, OR POSES SIGNIFICANT RISK TO THE PATIENT'S HEALTH: HISTORY OF THE PRESENT ILLNESS: Pt states few days of moderate pain at the throat, and assoc nasal congestion.   PAST MEDICAL HISTORY reviewed and up to date today. REVIEW OF SYSTEMS: Denies fever and  BRBPR. PHYSICAL EXAMINATION: VITAL SIGNS:  See vs page. GENERAL: no distress. head: no deformity eyes: no periorbital swelling, no proptosis external nose and ears are normal mouth: no lesion seen Both eac's and tm's are normal. LAB/XRAY RESULTS: UA pos for RBC Lab Results  Component Value Date   HGBA1C 5.9 12/20/2014  IMPRESSION: Hematuria, new, microscopic, uncertain etiology. URI, new PLAN:  Recheck UA i have sent a prescription to your pharmacy, for an antibiotic pill.  Loratadine-d (non-prescription) will help your congestion.  Please see an allergy specialist.  you will receive a phone call, about a day and time for an appointment.

## 2014-12-20 NOTE — Patient Instructions (Addendum)
i have sent a prescription to your pharmacy, for an antibiotic pill.  Loratadine-d (non-prescription) will help your congestion.  Please see an allergy specialist.  you will receive a phone call, about a day and time for an appointment.  please consider these measures for your health:  minimize alcohol.  do not use tobacco products.  have a colonoscopy at least every 10 years from age 89.  keep firearms safely stored.  always use seat belts.  have working smoke alarms in your home.  see an eye doctor and dentist regularly.  never drive under the influence of alcohol or drugs (including prescription drugs).  those with fair skin should take precautions against the sun. blood tests are requested for you today.  We'll let you know about the results.  Please come back for a follow-up appointment in 6 months.

## 2014-12-22 IMAGING — CR DG PELVIS 1-2V
1 series · 1 of 1 positions shown · non-contrast
Comparison: Lumbosacral spine series 03/10/2009

CLINICAL DATA: Pain in the left sacroiliac joint for 2-3 months
with no known injury.  The film was performed standing as the
patient was unable to lie flat on the table.

PELVIS - 1-2 VIEW

[w pelvis]
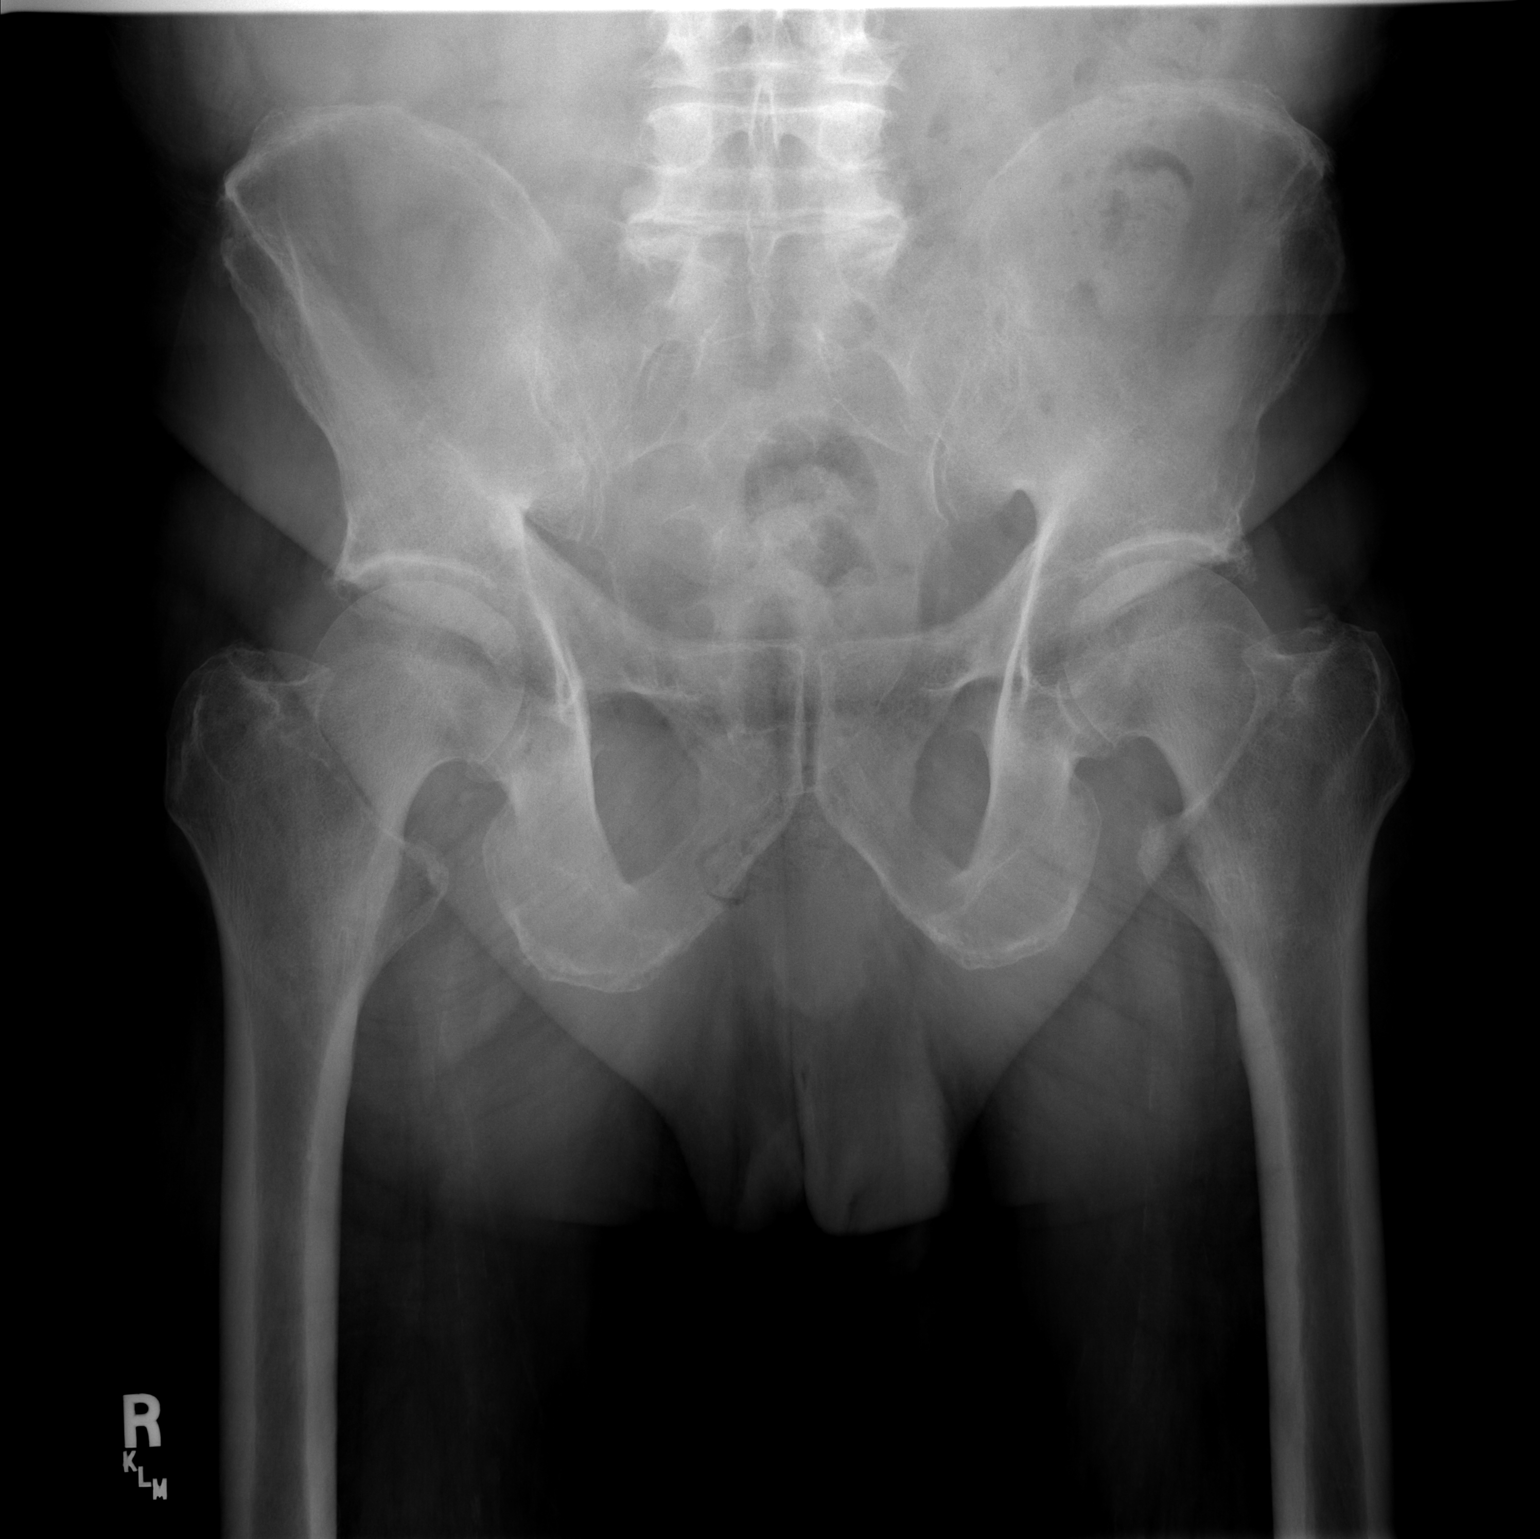

[1 of 1 positions shown; findings below may reference images not displayed]

FINDINGS: Bone density is mildly diminished compatible with age.
Mild diffuse bilateral joint space narrowing is seen associated
with both hips with some associated subchondral sclerosis and mild
osteophytosis correlating with mild degenerative change.  The
sacroiliac joints appear maintained.  The appearance of the
sacroiliac joints is unchanged in comparison with prior exam from
3300.  Mild degenerative change of the lower lumbar spine with disc
space narrowing at the L4-5 and L5-S1 levels also appear unchanged.

No worrisome focal or acute abnormality is identified about the
visualized pelvic ring.  The visualized sacral white lines appear
intact.
IMPRESSION: Stable mild degenerative changes of the lower lumbosacral spine.
Mild degenerative change of both hips.

Sacroiliac joints appear stable.

## 2014-12-28 LAB — HEPATIC FUNCTION PANEL
ALBUMIN: 3.8 g/dL (ref 3.5–5.2)
ALT: 20 U/L (ref 0–53)
AST: 20 U/L (ref 0–37)
Alkaline Phosphatase: 39 U/L (ref 39–117)
BILIRUBIN DIRECT: 0.1 mg/dL (ref 0.0–0.3)
TOTAL PROTEIN: 5.9 g/dL — AB (ref 6.0–8.3)
Total Bilirubin: 0.4 mg/dL (ref 0.2–1.2)

## 2014-12-29 ENCOUNTER — Other Ambulatory Visit: Payer: Commercial Managed Care - HMO

## 2015-01-04 ENCOUNTER — Encounter: Payer: Self-pay | Admitting: Allergy and Immunology

## 2015-01-04 ENCOUNTER — Ambulatory Visit (INDEPENDENT_AMBULATORY_CARE_PROVIDER_SITE_OTHER): Payer: Medicare Other | Admitting: Allergy and Immunology

## 2015-01-04 ENCOUNTER — Telehealth: Payer: Self-pay

## 2015-01-04 ENCOUNTER — Ambulatory Visit: Payer: Commercial Managed Care - HMO | Admitting: Allergy and Immunology

## 2015-01-04 VITALS — BP 122/60 | HR 80 | Temp 98.0°F | Resp 20 | Ht 70.0 in | Wt 175.0 lb

## 2015-01-04 DIAGNOSIS — J3089 Other allergic rhinitis: Secondary | ICD-10-CM | POA: Diagnosis not present

## 2015-01-04 MED ORDER — SIMVASTATIN 80 MG PO TABS: 80.0000 mg | ORAL_TABLET | Freq: Every day | ORAL | Status: DC

## 2015-01-04 MED ORDER — TRAMADOL HCL 50 MG PO TABS: 50.0000 mg | ORAL_TABLET | Freq: Four times a day (QID) | ORAL | Status: DC | PRN

## 2015-01-04 MED ORDER — SERTRALINE HCL 50 MG PO TABS: ORAL_TABLET | ORAL | Status: DC

## 2015-01-04 MED ORDER — MONTELUKAST SODIUM 10 MG PO TABS: 10.0000 mg | ORAL_TABLET | Freq: Every day | ORAL | Status: DC

## 2015-01-04 MED ORDER — AZELASTINE HCL 0.1 % NA SOLN: NASAL | Status: DC

## 2015-01-04 MED ORDER — METFORMIN HCL ER 500 MG PO TB24: 1000.0000 mg | ORAL_TABLET | Freq: Every day | ORAL | Status: DC

## 2015-01-04 NOTE — Telephone Encounter (Signed)
i printed 

## 2015-01-04 NOTE — Patient Instructions (Signed)
Perennial and seasonal allergic rhinitis  Aeroallergen avoidance measures have been discussed and provided in written form.  A prescription has been provided for azelastine nasal spray, 1-2 sprays per nostril 2 times daily as needed. Proper nasal spray technique has been discussed and demonstrated.   Nasal saline lavage (NeilMed) as needed has been recommended along with instructions for proper administration.   Return in about 4 months (around 05/04/2015), or if symptoms worsen or fail to improve.  Reducing Pollen Exposure  The American Academy of Allergy, Asthma and Immunology suggests the following steps to reduce your exposure to pollen during allergy seasons.    1. Do not hang sheets or clothing out to dry; pollen may collect on these items. 2. Do not mow lawns or spend time around freshly cut grass; mowing stirs up pollen. 3. Keep windows closed at night.  Keep car windows closed while driving. 4. Minimize morning activities outdoors, a time when pollen counts are usually at their highest. 5. Stay indoors as much as possible when pollen counts or humidity is high and on windy days when pollen tends to remain in the air longer. 6. Use air conditioning when possible.  Many air conditioners have filters that trap the pollen spores. 7. Use a HEPA room air filter to remove pollen form the indoor air you breathe.   Control of Mold Allergen  Mold and fungi can grow on a variety of surfaces provided certain temperature and moisture conditions exist.  Outdoor molds grow on plants, decaying vegetation and soil.  The major outdoor mold, Alternaria and Cladosporium, are found in very high numbers during hot and dry conditions.  Generally, a late Summer - Fall peak is seen for common outdoor fungal spores.  Rain will temporarily lower outdoor mold spore count, but counts rise rapidly when the rainy period ends.  The most important indoor molds are Aspergillus and Penicillium.  Dark, humid and poorly  ventilated basements are ideal sites for mold growth.  The next most common sites of mold growth are the bathroom and the kitchen.  Outdoor MicrosoftMold Control 1. Use air conditioning and keep windows closed 2. Avoid exposure to decaying vegetation. 3. Avoid leaf raking. 4. Avoid grain handling. 5. Consider wearing a face mask if working in moldy areas.  Indoor Mold Control 1. Maintain humidity below 50%. 2. Clean washable surfaces with 5% bleach solution. 3. Remove sources e.g. Contaminated carpets.

## 2015-01-04 NOTE — Telephone Encounter (Signed)
Rx faxed per pt's request.  

## 2015-01-04 NOTE — Telephone Encounter (Signed)
Pt is requesting a refill on his Tramadol medication for a 90 day supply.  Please advise, Thanks!

## 2015-01-04 NOTE — Progress Notes (Signed)
[facility] [place]  New Patient Note  RE: [name] MRN: [id number] DOB: July 31, 1931 Date of Office Visit: 01/04/2015  Referring provider: Romero Belling, MD Primary care provider: Romero Belling, MD  Chief Complaint: Allergic Rhinitis    History of present illness: HPI Comments: [name] is a 82 y.o. male presents today for his initial consultation of rhinitis.  He complains of thick nasal drainage "on and off" for several years area and he also experiences occasional sinus pressure and postnasal drainage.  He has tried fluticasone nasal spray without significant perceived benefit.  He is unable to identify any specific environmental triggers, however his symptoms seem to be worse in the fall.   Assessment and plan: Perennial and seasonal allergic rhinitis  Aeroallergen avoidance measures have been discussed and provided in written form.  A prescription has been provided for azelastine nasal spray, 1-2 sprays per nostril 2 times daily as needed. Proper nasal spray technique has been discussed and demonstrated.   Nasal saline lavage (NeilMed) as needed has been recommended along with instructions for proper administration.    Meds ordered this encounter  Medications  . azelastine (ASTELIN) 0.1 % nasal spray    Sig: USE 1-2 SPRAYS IN EACH NOSTRIL TWICE DAILY AS NEEDED    Dispense:  30 mL    Refill:  5    Diagnositics: Allergy skin testing: Positive to weed pollen and molds.    Physical examination: Blood pressure 122/60, pulse 80, temperature 98 F (36.7 C), resp. rate 20, height 5\' 10"  (1.778 m), weight 175 lb (79.379 kg).  General: Alert, interactive, in no acute distress. HEENT: TMs pearly gray, turbinates mildly edematous with clear discharge, post-pharynx moderately erythematous. Neck: Supple without lymphadenopathy. Lungs: Clear to auscultation without wheezing, rhonchi or rales. CV: Normal S1, S2 without  murmurs. Abdomen: Nondistended, nontender. Skin: Warm and dry, without lesions or rashes. Extremities:  No clubbing, cyanosis or edema. Neuro:   Grossly intact.  Review of systems: Review of Systems  Constitutional: Negative for fever, chills and weight loss.  HENT: Positive for congestion. Negative for nosebleeds.   Eyes: Negative for blurred vision.  Respiratory: Negative for hemoptysis, shortness of breath and wheezing.   Cardiovascular: Negative for chest pain.  Gastrointestinal: Negative for diarrhea and constipation.  Genitourinary: Negative for dysuria.  Musculoskeletal: Negative for myalgias and joint pain.  Skin: Negative for itching and rash.  Neurological: Negative for dizziness.  Endo/Heme/Allergies: Does not bruise/bleed easily.    Past medical history: Past Medical History  Diagnosis Date  . Coronary artery disease   . Leukopenia   . Smoker     quit 1968--25 ppy  . H/O: asbestos exposure   . Pernicious anemia   . ANEMIA, IRON DEFICIENCY   . COPD (chronic obstructive pulmonary disease) (HCC)   . BACK PAIN, LUMBAR   . Hypertension   . GERD (gastroesophageal reflux disease)   . DIABETES MELLITUS, TYPE II   . Charcot-Marie-Tooth disease     assoiciated muscular dystrophy     Past surgical history: Past Surgical History  Procedure Laterality Date  . Coronary artery bypass graft  1995    Family history: Family History  Problem Relation Age of Onset  . Charcot-Marie-Tooth disease Sister   . Charcot-Marie-Tooth disease Brother   . Charcot-Marie-Tooth disease Brother   . Charcot-Marie-Tooth disease Brother   . Charcot-Marie-Tooth disease Sister   . Charcot-Marie-Tooth disease Son   . Charcot-Marie-Tooth disease Other   .  COPD Daughter     never smoker    Social history: Social History   Social History  . Marital Status: Widowed    Spouse Name: N/A  . Number of Children: N/A  . Years of Education: N/A   Occupational History  . Retired      Games developerdiesel mechanic   Social History Main Topics  . Smoking status: Former Smoker -- 1.00 packs/day for 20 years    Types: Cigarettes    Quit date: 01/01/1966  . Smokeless tobacco: Never Used  . Alcohol Use: No  . Drug Use: No  . Sexual Activity: Not on file   Other Topics Concern  . Not on file   Social History Narrative   Was in service during [place].  Lives alone in a one story home.  On disability.           Environmental History:  [name] lives in a 80 year old house with carpeting throughout and central air/heat.  There no pets in the house.  He is a former cigarette smoker, having quit in 1968.    Medication List       This list is accurate as of: 01/04/15  3:42 PM.  Always use your most recent med list.               ADVOCATE REDI-CODE+ CONTROL Low Soln     ADVOCATE REDI-CODE+ Devi     aspirin 325 MG tablet  Take 325 mg by mouth daily.     azelastine 0.1 % nasal spray  Commonly known as:  ASTELIN  USE 1-2 SPRAYS IN EACH NOSTRIL TWICE DAILY AS NEEDED     CLEVER CHEK AUTO-CODE VOICE test strip  Generic drug:  glucose blood     clotrimazole-betamethasone cream  Commonly known as:  LOTRISONE  Apply topically 2 (two) times daily as needed.     VITAMIN B-12 IJ  Inject as directed. GET A B12 INJECTION ONCE EVERY MONTH     cyanocobalamin 1000 MCG tablet  Take 500 mcg by mouth 2 (two) times daily.     famotidine 20 MG tablet  Commonly known as:  PEPCID  One at bedtime     gabapentin 300 MG capsule  Commonly known as:  NEURONTIN  Take 1 tablet at bedtime for one week, then increase to 2 tablets at bedtime     metFORMIN 500 MG 24 hr tablet  Commonly known as:  GLUCOPHAGE-XR  Take 2 tablets (1,000 mg total) by mouth daily.     montelukast 10 MG tablet  Commonly known as:  SINGULAIR  Take 1 tablet (10 mg total) by mouth at bedtime.     multivitamin with minerals tablet  Take 1 tablet by mouth daily.     NIACIN FLUSH FREE 500 MG Caps  Generic drug:   Inositol Niacinate  Take by mouth.     nitroGLYCERIN 0.4 MG SL tablet  Commonly known as:  NITROSTAT  Place 0.4 mg under the tongue every 5 (five) minutes as needed.     pantoprazole 40 MG tablet  Commonly known as:  PROTONIX  Take 1 tablet (40 mg total) by mouth daily. Take 30-60 min before first meal of the day     sertraline 50 MG tablet  Commonly known as:  ZOLOFT  TAKE 1 TABLET (50 MG TOTAL) BY MOUTH DAILY.     SIMPLE DIAGNOSTICS LANCING DEV Misc     simvastatin 80 MG tablet  Commonly known as:  ZOCOR  Take 1  tablet (80 mg total) by mouth at bedtime.     traMADol 50 MG tablet  Commonly known as:  ULTRAM  Take 1 tablet (50 mg total) by mouth every 6 (six) hours as needed.     vitamin C 500 MG tablet  Commonly known as:  ASCORBIC ACID  Take 500 mg by mouth 2 (two) times daily.     Zinc 50 MG Caps  Take 1 capsule by mouth.        Known medication allergies: No Known Allergies  I appreciate the opportunity to take part in this [name]'s care. Please do not hesitate to contact me with questions.  Sincerely,   R. Jorene Guest, MD

## 2015-01-04 NOTE — Assessment & Plan Note (Signed)
   Aeroallergen avoidance measures have been discussed and provided in written form.  A prescription has been provided for azelastine nasal spray, 1-2 sprays per nostril 2 times daily as needed. Proper nasal spray technique has been discussed and demonstrated.   Nasal saline lavage (NeilMed) as needed has been recommended along with instructions for proper administration.

## 2015-01-10 ENCOUNTER — Ambulatory Visit: Payer: Commercial Managed Care - HMO | Admitting: Podiatry

## 2015-01-17 ENCOUNTER — Ambulatory Visit (INDEPENDENT_AMBULATORY_CARE_PROVIDER_SITE_OTHER): Payer: Medicare Other | Admitting: Podiatry

## 2015-01-17 ENCOUNTER — Encounter: Payer: Self-pay | Admitting: Podiatry

## 2015-01-17 DIAGNOSIS — L89891 Pressure ulcer of other site, stage 1: Secondary | ICD-10-CM | POA: Diagnosis not present

## 2015-01-17 DIAGNOSIS — L97521 Non-pressure chronic ulcer of other part of left foot limited to breakdown of skin: Secondary | ICD-10-CM

## 2015-01-17 NOTE — Progress Notes (Signed)
Subjective:     Patient ID: David PaulsFrank J Charland, male   DOB: 24-Dec-1931, 82 y.o.   MRN: [id number]  HPI this patient returns to the office with a chief complaint of a painful pre-ulcerous callus under the ball of his left foot. There is no evidence of any drainage from the pre-ulcerous site. No redness or swelling is noted. He presents the office for preventive foot care services   Review of Systems     Objective:   Physical Exam GENERAL APPEARANCE: Alert, conversant. Appropriately groomed. No acute distress.  VASCULAR: Pedal pulses palpable at  [facility] and PT bilateral but diminished.  Capillary refill time is immediate to all digits,  Normal temperature gradient.  Digital hair growth is absent.  NEUROLOGIC: sensation is abnormal  to 5.07 monofilament at 5/5 sites bilateral.  Light touch is intact bilateral, Muscle strength normal.  MUSCULOSKELETAL: acceptable muscle strength, tone and stability bilateral.  Intrinsic muscluature intact bilateral.  Rectus appearance of foot and digits noted bilateral.   DERMATOLOGIC: skin color, texture, and turgor are within normal limits.  No preulcerative lesions or ulcers  are seen, no interdigital maceration noted.  No open lesions present.  Digital nails are asymptomatic. No drainage noted.      Assessment:     Preulcerous lesion sub 1st MPJ left foot.     Plan:     Debridement of skin lesion left foot.  RTC 10 weeks.     Helane GuntherGregory Hammond Obeirne DPM

## 2015-02-22 ENCOUNTER — Telehealth: Payer: Self-pay | Admitting: Endocrinology

## 2015-02-22 NOTE — Telephone Encounter (Signed)
Appointment made for March 8th.

## 2015-02-22 NOTE — Telephone Encounter (Signed)
please call patient: The DM shoes form requires a recent foot exam Please make an appointment

## 2015-03-09 ENCOUNTER — Ambulatory Visit (INDEPENDENT_AMBULATORY_CARE_PROVIDER_SITE_OTHER): Payer: Medicare Other | Admitting: Endocrinology

## 2015-03-09 ENCOUNTER — Encounter: Payer: Self-pay | Admitting: Endocrinology

## 2015-03-09 VITALS — BP 128/80 | HR 63 | Temp 97.7°F | Ht 70.0 in | Wt 178.0 lb

## 2015-03-09 DIAGNOSIS — E1142 Type 2 diabetes mellitus with diabetic polyneuropathy: Secondary | ICD-10-CM | POA: Diagnosis not present

## 2015-03-09 DIAGNOSIS — E1159 Type 2 diabetes mellitus with other circulatory complications: Secondary | ICD-10-CM | POA: Diagnosis not present

## 2015-03-09 LAB — POCT GLYCOSYLATED HEMOGLOBIN (HGB A1C): HEMOGLOBIN A1C: 5.8

## 2015-03-09 NOTE — Progress Notes (Signed)
Subjective:    Patient ID: [name], male    DOB: 08-19-1931, 82 y.o.   MRN: [id number]  HPI Pt returns for f/u of diabetes mellitus: DM type: 2 Dx'ed: 2008 Complications: CAD Therapy: metformin. DKA: never Severe hypoglycemia: never Pancreatitis: never Other: he has never been on insulin Interval history: no cbg record, but states cbg's are well-controlled.  pt states he feels well in general. Past Medical History  Diagnosis Date  . Coronary artery disease   . Leukopenia   . Smoker     quit 1968--25 ppy  . H/O: asbestos exposure   . Pernicious anemia   . ANEMIA, IRON DEFICIENCY   . COPD (chronic obstructive pulmonary disease) (HCC)   . BACK PAIN, LUMBAR   . Hypertension   . GERD (gastroesophageal reflux disease)   . DIABETES MELLITUS, TYPE II   . Charcot-Marie-Tooth disease     assoiciated muscular dystrophy     Past Surgical History  Procedure Laterality Date  . Coronary artery bypass graft  1995    Social History   Social History  . Marital Status: Widowed    Spouse Name: N/A  . Number of Children: N/A  . Years of Education: N/A   Occupational History  . Retired     Games developer   Social History Main Topics  . Smoking status: Former Smoker -- 1.00 packs/day for 20 years    Types: Cigarettes    Quit date: 01/01/1966  . Smokeless tobacco: Never Used  . Alcohol Use: No  . Drug Use: No  . Sexual Activity: Not on file   Other Topics Concern  . Not on file   Social History Narrative   Was in service during [place] War.  Lives alone in a one story home.  On disability.            Current Outpatient Prescriptions on File Prior to Visit  Medication Sig Dispense Refill  . Ascorbic Acid (VITAMIN C) 500 MG tablet Take 500 mg by mouth 2 (two) times daily.     Marland Kitchen aspirin 325 MG tablet Take 325 mg by mouth daily.      Marland Kitchen azelastine (ASTELIN) 0.1 % nasal spray USE 1-2 SPRAYS IN EACH NOSTRIL TWICE DAILY AS NEEDED 30 mL 5  . Blood Glucose Calibration  (ADVOCATE REDI-CODE+ CONTROL) LOW SOLN   3  . Blood Glucose Monitoring Suppl (ADVOCATE REDI-CODE+) DEVI   1  . CLEVER CHEK AUTO-CODE VOICE test strip     . clotrimazole-betamethasone (LOTRISONE) cream Apply topically 2 (two) times daily as needed.     . Cyanocobalamin (VITAMIN B-12 IJ) Inject as directed. GET A B12 INJECTION ONCE EVERY MONTH    . cyanocobalamin 1000 MCG tablet Take 500 mcg by mouth 2 (two) times daily.     . famotidine (PEPCID) 20 MG tablet One at bedtime 30 tablet 2  . gabapentin (NEURONTIN) 300 MG capsule Take 1 tablet at bedtime for one week, then increase to 2 tablets at bedtime 60 capsule 11  . Inositol Niacinate (NIACIN FLUSH FREE) 500 MG CAPS Take by mouth.    Dem[employer] Shiner [employer] (SIMPLE DIAGNOSTICS LANCING DEV) MISC   1  . metFORMIN (GLUCOPHAGE-XR) 500 MG 24 hr tablet Take 2 tablets (1,000 mg total) by mouth daily. 180 tablet 3  . montelukast (SINGULAIR) 10 MG tablet Take 1 tablet (10 mg total) by mouth at bedtime. 90 tablet 3  . Multiple Vitamins-Minerals (MULTIVITAMIN WITH MINERALS) tablet Take 1 tablet by mouth daily.      Marland Kitchen  pantoprazole (PROTONIX) 40 MG tablet Take 1 tablet (40 mg total) by mouth daily. Take 30-60 min before first meal of the day 30 tablet 2  . sertraline (ZOLOFT) 50 MG tablet TAKE 1 TABLET (50 MG TOTAL) BY MOUTH DAILY. 90 tablet 3  . simvastatin (ZOCOR) 80 MG tablet Take 1 tablet (80 mg total) by mouth at bedtime. 90 tablet 3  . traMADol (ULTRAM) 50 MG tablet Take 1 tablet (50 mg total) by mouth every 6 (six) hours as needed. 270 tablet 1  . Zinc 50 MG CAPS Take 1 capsule by mouth.      . nitroGLYCERIN (NITROSTAT) 0.4 MG SL tablet Place 0.4 mg under the tongue every 5 (five) minutes as needed.     No current facility-administered medications on file prior to visit.    No Known Allergies  Family History  Problem Relation Age of Onset  . Charcot-Marie-Tooth disease Sister   . Charcot-Marie-Tooth disease Brother   . Charcot-Marie-Tooth disease  Brother   . Charcot-Marie-Tooth disease Brother   . Charcot-Marie-Tooth disease Sister   . Charcot-Marie-Tooth disease Son   . Charcot-Marie-Tooth disease Other   . COPD Daughter     never smoker    BP 128/80 mmHg  Pulse 63  Temp(Src) 97.7 F (36.5 C) (Oral)  Ht 5\' 10"  (1.778 m)  Wt 178 lb (80.74 kg)  BMI 25.54 kg/m2  SpO2 97%     Review of Systems He has regained a few lbs    Objective:   Physical Exam VITAL SIGNS:  See vs page GENERAL: no distress Pulses: dorsalis pedis intact bilat, but reduced from normal  MSK: no deformity of the feet CV: no leg edema Skin:  no ulcer on the feet, but the skin is dry.  feet are slightly cool to touch.  Old healed surgical scar at the right leg (vein harvest).  Neuro: sensation is intact to touch on the feet, but decreased from normal. Ext: There is bilateral onychomycosis of the toenails.     A1c=5.8%     Assessment & Plan:  DM: well-controlled.  Please continue the same medication.  Patient is advised the following: Patient Instructions  Please continue the same metformin.  I'll do the form for your shoes.  Please come back for a follow-up appointment in 5-6 months.

## 2015-03-09 NOTE — Patient Instructions (Signed)
Please continue the same metformin.  I'll do the form for your shoes.  Please come back for a follow-up appointment in 5-6 months.

## 2015-03-10 DIAGNOSIS — E119 Type 2 diabetes mellitus without complications: Secondary | ICD-10-CM | POA: Insufficient documentation

## 2015-03-21 ENCOUNTER — Encounter: Payer: Self-pay | Admitting: Podiatry

## 2015-03-21 ENCOUNTER — Ambulatory Visit (INDEPENDENT_AMBULATORY_CARE_PROVIDER_SITE_OTHER): Payer: Medicare Other | Admitting: Podiatry

## 2015-03-21 VITALS — BP 127/63 | HR 59 | Resp 14

## 2015-03-21 DIAGNOSIS — L97521 Non-pressure chronic ulcer of other part of left foot limited to breakdown of skin: Secondary | ICD-10-CM

## 2015-03-21 DIAGNOSIS — E1140 Type 2 diabetes mellitus with diabetic neuropathy, unspecified: Secondary | ICD-10-CM

## 2015-03-21 DIAGNOSIS — L89891 Pressure ulcer of other site, stage 1: Secondary | ICD-10-CM

## 2015-03-21 NOTE — Progress Notes (Signed)
Subjective:     Patient ID: [name], male   DOB: Aug 20, 1931, 82 y.o.   MRN: [id number]  HPI this patient returns to the office with a chief complaint of a painful pre-ulcerous callus under the ball of his left foot. There is no evidence of any drainage from the pre-ulcerous site. No redness or swelling is noted. He presents the office for preventive foot care services.  Patient says the pain returned soon after last visit and todays visit is earlier than scheduled.   Review of Systems     Objective:   Physical Exam GENERAL APPEARANCE: Alert, conversant. Appropriately groomed. No acute distress.  VASCULAR: Pedal pulses palpable at  [facility] and [facility] bilateral but diminished.  Capillary refill time is immediate to all digits,  Normal temperature gradient.  Digital hair growth is absent.  NEUROLOGIC: sensation is abnormal  to 5.07 monofilament at 5/5 sites bilateral.  Light touch is intact bilateral, Muscle strength normal.  MUSCULOSKELETAL: acceptable muscle strength, tone and stability bilateral.  Intrinsic muscluature intact bilateral.  Rectus appearance of foot and digits noted bilateral.   DERMATOLOGIC: skin color, texture, and turgor are within normal limits.  , no interdigital maceration noted.  No open lesions present.  Digital nails are asymptomatic. No drainage noted.  Ulcer  There is hyperkeratotic roof over pre-ulcer sub 1st MPJ  Left foot.      Assessment:     Preulcerous lesion sub 1st MPJ left foot.     Plan:     Debridement of skin lesion left foot. Neospotin/DSD.    RTC 10 weeks.     Helane GuntherGregory Alletta Mattos DPM

## 2015-03-28 ENCOUNTER — Ambulatory Visit: Payer: Medicare Other | Admitting: Podiatry

## 2015-04-06 ENCOUNTER — Institutional Professional Consult (permissible substitution): Payer: Commercial Managed Care - HMO | Admitting: Internal Medicine

## 2015-05-02 ENCOUNTER — Encounter: Payer: Self-pay | Admitting: Podiatry

## 2015-05-02 ENCOUNTER — Ambulatory Visit (INDEPENDENT_AMBULATORY_CARE_PROVIDER_SITE_OTHER): Payer: Medicare Other | Admitting: Podiatry

## 2015-05-02 VITALS — BP 146/117 | HR 71 | Resp 14

## 2015-05-02 DIAGNOSIS — E1140 Type 2 diabetes mellitus with diabetic neuropathy, unspecified: Secondary | ICD-10-CM | POA: Diagnosis not present

## 2015-05-02 DIAGNOSIS — L89891 Pressure ulcer of other site, stage 1: Secondary | ICD-10-CM

## 2015-05-02 DIAGNOSIS — L97521 Non-pressure chronic ulcer of other part of left foot limited to breakdown of skin: Secondary | ICD-10-CM

## 2015-05-02 NOTE — Progress Notes (Signed)
Subjective:     Patient ID: [name], male   DOB: 04/19/1931, 82 y.o.   MRN: [id number]  HPI this patient returns to the office with a chief complaint of a painful draining ulcer  under the ball of his left foot. . No redness or swelling is noted. He has been soaking this foot but he feels it is not healing since there is bleeding on his sock. He says there is also burning under the ball of left big toe.  He presents the office for preventive foot care services.    Review of Systems     Objective:   Physical Exam GENERAL APPEARANCE: Alert, conversant. Appropriately groomed. No acute distress.  VASCULAR: Pedal [facility]  [facility] bilateral but diminished.  Capillary refill time is immediate to all digits,  Normal temperature gradient.  Digital hair growth is absent.  NEUROLOGIC: sensation is abnormal  to 5.07 monofilament at 5/5 sites bilateral.  Light touch is intact bilateral, Muscle strength normal.  MUSCULOSKELETAL: acceptable muscle strength, tone and stability bilateral.  Intrinsic muscluature intact bilateral.  Rectus appearance of foot and digits noted bilateral.   DERMATOLOGIC: skin color, texture, and turgor are within normal limits.  , no interdigital maceration noted.  No open lesions present.  Digital nails are asymptomatic. No drainage noted.  Ulcer  There is a 10 mm x 10 mm lesion under the ball of left foot.  No infection or drainage.  No malodor noted.  Necrotic callus noted over ulcer.      Assessment:     Diabetic ulcer left foot.     Plan:     Debridement of ulcer  left foot. Neospotin/DSD.    RTC 2  weeks.  Home instructions given   Helane GuntherGregory Keilani Terrance DPM

## 2015-05-16 ENCOUNTER — Encounter: Payer: Self-pay | Admitting: Podiatry

## 2015-05-16 ENCOUNTER — Ambulatory Visit (INDEPENDENT_AMBULATORY_CARE_PROVIDER_SITE_OTHER): Payer: Medicare Other | Admitting: Podiatry

## 2015-05-16 VITALS — BP 139/72 | HR 62 | Resp 14

## 2015-05-16 DIAGNOSIS — L89891 Pressure ulcer of other site, stage 1: Secondary | ICD-10-CM | POA: Diagnosis not present

## 2015-05-16 DIAGNOSIS — E1140 Type 2 diabetes mellitus with diabetic neuropathy, unspecified: Secondary | ICD-10-CM

## 2015-05-16 DIAGNOSIS — L97521 Non-pressure chronic ulcer of other part of left foot limited to breakdown of skin: Secondary | ICD-10-CM

## 2015-05-16 NOTE — Progress Notes (Signed)
Subjective:     Patient ID: [name], male   DOB: 06-Jun-1931, 82 y.o.   MRN: [id number]  HPI this patient returns to the office with a chief complaint of a painful draining ulcer  under the ball of his left foot. . No redness or swelling is noted. He has been soaking this foot. He has been hesitant soaking in epsom salts.  He presents with drainage on his bandage. Marland Kitchen.  He presents the office for continued care of his ulcer left foot.   Review of Systems     Objective:   Physical Exam GENERAL APPEARANCE: Alert, conversant. Appropriately groomed. No acute distress.  VASCULAR: Pedal pulses palpable at  [facility] bilateral but diminished.  Capillary refill time is immediate to all digits,  Normal temperature gradient.  Digital hair growth is absent.  NEUROLOGIC: sensation is abnormal  to 5.07 monofilament at 5/5 sites bilateral.  Light touch is intact bilateral, Muscle strength normal.  MUSCULOSKELETAL: acceptable muscle strength, tone and stability bilateral.  Intrinsic muscluature intact bilateral.  Rectus appearance of foot and digits noted bilateral.   DERMATOLOGIC: skin color, texture, and turgor are within normal limits.  , no interdigital maceration noted.  No open lesions present.  Digital nails are asymptomatic. No drainage noted.  Ulcer  There is a 5 x 5 mm lesion under the ball of left foot.  No infection or drainage.  No malodor noted.  Healing noted around his ulcer.      Assessment:     Diabetic ulcer left foot.     Plan:     Debridement of ulcer  left foot. Neosporin/DSD.    RTC prn.  Home instructions given   Helane GuntherGregory Naziya Hegwood DPM

## 2015-05-25 DIAGNOSIS — M79609 Pain in unspecified limb: Secondary | ICD-10-CM | POA: Diagnosis not present

## 2015-05-31 DIAGNOSIS — M25511 Pain in right shoulder: Secondary | ICD-10-CM | POA: Diagnosis not present

## 2015-06-06 ENCOUNTER — Telehealth: Payer: Self-pay | Admitting: Endocrinology

## 2015-06-06 NOTE — Telephone Encounter (Signed)
[facility] request for the Trilogy Ventilator was not approved, must have a blood test done first and some other requriement before they are able to approve. Please advise [phone]

## 2015-06-06 NOTE — Telephone Encounter (Signed)
i am unfamiliar with this product?  What is it for?

## 2015-06-06 NOTE — Telephone Encounter (Signed)
See note below. Do we handle this for the patient?

## 2015-06-10 NOTE — Telephone Encounter (Signed)
Left message for patient to call with information for Dr. Everardo AllEllison regarding Trilogy Ventillator.

## 2015-06-27 ENCOUNTER — Telehealth: Payer: Self-pay | Admitting: Endocrinology

## 2015-06-27 NOTE — Telephone Encounter (Signed)
Pt needs refills on tramadol and needs to discuss the breathing machine

## 2015-06-29 ENCOUNTER — Telehealth: Payer: Self-pay

## 2015-06-29 NOTE — Telephone Encounter (Signed)
Called patient. Notified him that Dr. Everardo AllEllison would be back on Monday, and Dr. Elvera LennoxGherghe stated tramadol had not been refilled in 5 months, to let Dr. Everardo AllEllison address this when he returns. Patient understood, and asked to be called back on Monday if the prescription would be refilled.

## 2015-06-29 NOTE — Telephone Encounter (Signed)
The medicine was not refilled in the last 5 months. I would let Dr. Everardo AllEllison at address when he comes back.

## 2015-06-29 NOTE — Telephone Encounter (Signed)
Would you be ok with signing for the pt's tramadol prescription during Dr. George HughEllison's absence? Thanks!

## 2015-06-30 DIAGNOSIS — J9610 Chronic respiratory failure, unspecified whether with hypoxia or hypercapnia: Secondary | ICD-10-CM | POA: Diagnosis not present

## 2015-06-30 DIAGNOSIS — G600 Hereditary motor and sensory neuropathy: Secondary | ICD-10-CM | POA: Diagnosis not present

## 2015-07-04 ENCOUNTER — Ambulatory Visit: Payer: Commercial Managed Care - HMO | Admitting: Endocrinology

## 2015-07-04 ENCOUNTER — Other Ambulatory Visit: Payer: Self-pay | Admitting: Endocrinology

## 2015-07-04 ENCOUNTER — Telehealth: Payer: Self-pay | Admitting: Endocrinology

## 2015-07-04 NOTE — Telephone Encounter (Signed)
please call patient: Due to an interaction, we should stop the sertraline, or reduce the tramadol to twice a day. which do you prefer?

## 2015-07-06 ENCOUNTER — Encounter: Payer: Self-pay | Admitting: Sports Medicine

## 2015-07-06 ENCOUNTER — Ambulatory Visit (INDEPENDENT_AMBULATORY_CARE_PROVIDER_SITE_OTHER): Payer: Medicare Other | Admitting: Sports Medicine

## 2015-07-06 DIAGNOSIS — E1140 Type 2 diabetes mellitus with diabetic neuropathy, unspecified: Secondary | ICD-10-CM

## 2015-07-06 DIAGNOSIS — G600 Hereditary motor and sensory neuropathy: Secondary | ICD-10-CM

## 2015-07-06 DIAGNOSIS — L89891 Pressure ulcer of other site, stage 1: Secondary | ICD-10-CM | POA: Diagnosis not present

## 2015-07-06 DIAGNOSIS — M79675 Pain in left toe(s): Secondary | ICD-10-CM

## 2015-07-06 DIAGNOSIS — L97521 Non-pressure chronic ulcer of other part of left foot limited to breakdown of skin: Secondary | ICD-10-CM

## 2015-07-06 DIAGNOSIS — R269 Unspecified abnormalities of gait and mobility: Secondary | ICD-10-CM

## 2015-07-06 NOTE — Telephone Encounter (Signed)
LVM for pt to call back as soon as possible.   RE: PCP medication alert below.

## 2015-07-06 NOTE — Progress Notes (Signed)
Patient ID: [name], male   DOB: [birth year], 82 y.o.   MRN: [id number] Subjective: [name] is a 82 y.o. male patient seen in office for evaluation of ulceration of the left foot, sub-met 1. Patient has a history of diabetes and a blood glucose level  today of 100 mg/dl.   Patient is changing the dressing using triple antibiotic at home/ with help of daughter. Denies nausea/fever/vomiting/chills/night sweats/shortness of breath/pain. Patient has no other pedal complaints at this time.  Patient Active Problem List   Diagnosis Date Noted  . Diabetes ([place]) 03/10/2015  . Perennial and seasonal allergic rhinitis 12/20/2014  . Hematuria 12/20/2014  . Loss of weight 05/06/2014  . Other pancytopenia ([place]) 05/05/2014  . CAP (community acquired pneumonia) 05/05/2014  . Hypocalcemia 05/05/2014  . Skin lesion 04/30/2014  . 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 Abdominal pain, right upper quadrant 05/04/2013  . Cold intolerance 05/04/2013  . Low back pain 04/03/2012  . Encounter for long-term (current) use of other medications 12/11/2011  . Routine general medical examination at a health care facility 12/17/2010  . Screening for prostate cancer 12/12/2010  . Osteoarthrosis, unspecified whether generalized or localized, unspecified site 10/24/2010  . SKIN RASH 06/29/2009  . Iron deficiency anemia 03/10/2009  . BACK PAIN, LUMBAR 03/10/2009  . [place] DISEASE 05/03/2008  . DYSPNEA 05/03/2008  . DEPRESSIVE DISORDER NOT ELSEWHERE CLASSIFIED 05/22/2007  . CHEST PAIN 05/22/2007  . ANEMIA, PERNICIOUS 12/02/2006  . HYPERTENSION 07/26/2006  . CORONARY ARTERY DISEASE 07/26/2006  . GERD 07/26/2006   Current Outpatient Prescriptions on File Prior to Visit  Medication Sig Dispense Refill  . Ascorbic Acid (VITAMIN C) 500 MG tablet Take 500 mg by mouth 2 (two) times daily.     Marland Kitchen aspirin 325 MG tablet Take 325 mg by mouth daily.      Marland Kitchen azelastine (ASTELIN) 0.1 % nasal spray  USE 1-2 SPRAYS IN EACH NOSTRIL TWICE DAILY AS NEEDED 30 mL 5  . Blood Glucose Calibration (ADVOCATE REDI-CODE+ CONTROL) LOW SOLN   3  . Blood Glucose Monitoring Suppl (ADVOCATE REDI-CODE+) DEVI   1  . CLEVER CHEK AUTO-CODE VOICE test strip     . clotrimazole-betamethasone (LOTRISONE) cream Apply topically 2 (two) times daily as needed.     . Cyanocobalamin (VITAMIN B-12 IJ) Inject as directed. GET A B12 INJECTION ONCE EVERY MONTH    . cyanocobalamin 1000 MCG tablet Take 500 mcg by mouth 2 (two) times daily.     . famotidine (PEPCID) 20 MG tablet One at bedtime 30 tablet 2  . gabapentin (NEURONTIN) 300 MG capsule Take 1 tablet at bedtime for one week, then increase to 2 tablets at bedtime 60 capsule 11  . Inositol Niacinate (NIACIN FLUSH FREE) 500 MG CAPS Take by mouth.    Elmore Guise Devices (SIMPLE DIAGNOSTICS LANCING DEV) MISC   1  . metFORMIN (GLUCOPHAGE-XR) 500 MG 24 hr tablet Take 2 tablets (1,000 mg total) by mouth daily. 180 tablet 3  . montelukast (SINGULAIR) 10 MG tablet Take 1 tablet (10 mg total) by mouth at bedtime. 90 tablet 3  . Multiple Vitamins-Minerals (MULTIVITAMIN WITH MINERALS) tablet Take 1 tablet by mouth daily.      . nitroGLYCERIN (NITROSTAT) 0.4 MG SL tablet Place 0.4 mg under the tongue every 5 (five) minutes as needed.    . pantoprazole (PROTONIX) 40 MG tablet Take 1 tablet (40 mg total) by mouth daily. Take 30-60 min before first meal of the day 30 tablet  2  . sertraline (ZOLOFT) 50 MG tablet TAKE 1 TABLET (50 MG TOTAL) BY MOUTH DAILY. 90 tablet 3  . simvastatin (ZOCOR) 80 MG tablet Take 1 tablet (80 mg total) by mouth at bedtime. 90 tablet 3  . traMADol (ULTRAM) 50 MG tablet Take 1 tablet (50 mg total) by mouth every 6 (six) hours as needed. 270 tablet 1  . Zinc 50 MG CAPS Take 1 capsule by mouth.       No current facility-administered medications on file prior to visit.   No Known Allergies  No results found for this or any previous visit (from the past 2160  hour(s)).  Objective: There were no vitals filed for this visit.  General: Patient is awake, alert, oriented x 3 and in no acute distress.  Dermatology: Skin is warm and dry bilateral with a partial thickness ulceration present  Left sub-met 1. Ulceration measures 0.8 cm x 0.5 cm x 0.2 cm. There is a  Keratotic border with a granular base. The ulceration does not  probe to bone. There is no malodor, no active drainage, no erythema, no edema. No acute signs of infection.   Vascular: Dorsalis Pedis pulse = 1/4 Bilateral,  Posterior Tibial pulse = 1/4 Bilateral,  Capillary Fill Time < 5 seconds  Neurologic: Protective sensation absent bilateral using the 5.07/10g Semmes Weinstein Monofilament.  Musculosketal: There is decreased ankle joint range of motion Bilateral. There is decreased Subtalar joint range of motion Bilateral. There is a decrease in 1st metatarsophalangeal joint range of motion Bilateral. No Pain with palpation to ulcerated area. No pain with compression to calves bilateral. Muscle weakness requiring bracing bilateral.  Assessment and Plan:  Problem List Items Addressed This Visit      Nervous and Auditory   CHARCOT-MARIE-TOOTH DISEASE    Other Visit Diagnoses    Foot ulcer, left, limited to breakdown of skin ([place])    -  Primary    Type 2 diabetes, controlled, with neuropathy (HCC)        Pain of toe of left foot        Abnormality of gait          -Examined patient and discussed the progression of the wound and treatment alternatives. - Excisionally dedbrided ulceration to healthy bleeding borders using a sterile chisel  blade. -Applied Antibiotic cream and dry sterile dressing and instructed patient to continue with daily dressings at home consisting of the same -Applied offloading pad to insert inside shoe left foot and advised daughter to do the same to his new pair of shoes at home - Advised patient to go to the ER or return to office if the wound worsens or if  constitutional symptoms are present. -Patient to return to office as scheduled  for follow up care and evaluation or sooner if problems arise.  Landis Martins, DPM

## 2015-07-06 NOTE — Telephone Encounter (Signed)
PT returning your phone call 

## 2015-07-07 MED ORDER — TRAMADOL HCL 50 MG PO TABS: 50.0000 mg | ORAL_TABLET | Freq: Two times a day (BID) | ORAL | Status: DC | PRN

## 2015-07-07 NOTE — Telephone Encounter (Signed)
Ok, i printed 

## 2015-07-07 NOTE — Telephone Encounter (Signed)
I contacted the pt. Pt stated he would reduce the tramadol twice a day.

## 2015-07-19 ENCOUNTER — Telehealth: Payer: Self-pay | Admitting: Allergy and Immunology

## 2015-07-19 NOTE — Telephone Encounter (Signed)
He received a bill for $40 but he had paid his copay at his last visit so he wants to know why he still owes the balance.

## 2015-07-25 NOTE — Telephone Encounter (Signed)
EXPLAINED THAT THIS IS COINS ON TESTING - HE ASKED ME TO SEND HIM ANOTHER STATEMENT

## 2015-07-30 DIAGNOSIS — G600 Hereditary motor and sensory neuropathy: Secondary | ICD-10-CM | POA: Diagnosis not present

## 2015-07-30 DIAGNOSIS — J9610 Chronic respiratory failure, unspecified whether with hypoxia or hypercapnia: Secondary | ICD-10-CM | POA: Diagnosis not present

## 2015-08-18 ENCOUNTER — Telehealth: Payer: Self-pay | Admitting: *Deleted

## 2015-08-18 ENCOUNTER — Encounter: Payer: Self-pay | Admitting: Sports Medicine

## 2015-08-18 ENCOUNTER — Ambulatory Visit (INDEPENDENT_AMBULATORY_CARE_PROVIDER_SITE_OTHER): Payer: Medicare Other | Admitting: Sports Medicine

## 2015-08-18 DIAGNOSIS — G600 Hereditary motor and sensory neuropathy: Secondary | ICD-10-CM

## 2015-08-18 DIAGNOSIS — L89891 Pressure ulcer of other site, stage 1: Secondary | ICD-10-CM | POA: Diagnosis not present

## 2015-08-18 DIAGNOSIS — E1140 Type 2 diabetes mellitus with diabetic neuropathy, unspecified: Secondary | ICD-10-CM

## 2015-08-18 DIAGNOSIS — R269 Unspecified abnormalities of gait and mobility: Secondary | ICD-10-CM

## 2015-08-18 DIAGNOSIS — M79675 Pain in left toe(s): Secondary | ICD-10-CM

## 2015-08-18 DIAGNOSIS — L97521 Non-pressure chronic ulcer of other part of left foot limited to breakdown of skin: Secondary | ICD-10-CM

## 2015-08-18 NOTE — Progress Notes (Signed)
Patient ID: David [name], male   DOB: 01/29/1931, 82 y.o.   MRN: [id number] Subjective: David [name] is a 82 y.o. male patient seen in office for evaluation of ulceration of the left foot, sub-met 1. Patient has a history of diabetes and a blood glucose level not recorded, a1c 5-6.   Patient is changing the dressing using triple antibiotic at home/ with help of daughter. Patient request to have TCC placed like his bother had. Reports bother suffered with the same type of ulceration and got it healed with a cast. Denies nausea/fever/vomiting/chills/night sweats/shortness of breath/pain. Patient has no other pedal complaints at this time.  Patient Active Problem List   Diagnosis Date Noted  . Diabetes (HCC) 03/10/2015  . Perennial and seasonal allergic rhinitis 12/20/2014  . Hematuria 12/20/2014  . Loss of weight 05/06/2014  . Other pancytopenia (HCC) 05/05/2014  . CAP (community acquired pneumonia) 05/05/2014  . Hypocalcemia 05/05/2014  . Skin lesion 04/30/2014  . 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 Abdominal pain, right upper quadrant 05/04/2013  . Cold intolerance 05/04/2013  . Low back pain 04/03/2012  . Encounter for long-term (current) use of other medications 12/11/2011  . Routine general medical examination at a health care facility 12/17/2010  . Screening for prostate cancer 12/12/2010  . Osteoarthrosis, unspecified whether generalized or localized, unspecified site 10/24/2010  . SKIN RASH 06/29/2009  . Iron deficiency anemia 03/10/2009  . BACK PAIN, LUMBAR 03/10/2009  . CHARCOT-MARIE-TOOTH DISEASE 05/03/2008  . DYSPNEA 05/03/2008  . DEPRESSIVE DISORDER NOT ELSEWHERE CLASSIFIED 05/22/2007  . CHEST PAIN 05/22/2007  . ANEMIA, PERNICIOUS 12/02/2006  . HYPERTENSION 07/26/2006  . CORONARY ARTERY DISEASE 07/26/2006  . GERD 07/26/2006   Current Outpatient Prescriptions on File Prior to Visit  Medication Sig Dispense Refill  . Ascorbic Acid (VITAMIN C) 500 MG  tablet Take 500 mg by mouth 2 (two) times daily.     Marland Kitchen aspirin 325 MG tablet Take 325 mg by mouth daily.      Marland Kitchen azelastine (ASTELIN) 0.1 % nasal spray USE 1-2 SPRAYS IN EACH NOSTRIL TWICE DAILY AS NEEDED 30 mL 5  . Blood Glucose Calibration (ADVOCATE REDI-CODE+ CONTROL) LOW SOLN   3  . Blood Glucose Monitoring Suppl (ADVOCATE REDI-CODE+) DEVI   1  . CLEVER CHEK AUTO-CODE VOICE test strip     . clotrimazole-betamethasone (LOTRISONE) cream Apply topically 2 (two) times daily as needed.     . Cyanocobalamin (VITAMIN B-12 IJ) Inject as directed. GET A B12 INJECTION ONCE EVERY MONTH    . cyanocobalamin 1000 MCG tablet Take 500 mcg by mouth 2 (two) times daily.     . famotidine (PEPCID) 20 MG tablet One at bedtime 30 tablet 2  . gabapentin (NEURONTIN) 300 MG capsule Take 1 tablet at bedtime for one week, then increase to 2 tablets at bedtime 60 capsule 11  . Inositol Niacinate (NIACIN FLUSH FREE) 500 MG CAPS Take by mouth.    Demetra Shiner Devices (SIMPLE DIAGNOSTICS LANCING DEV) MISC   1  . metFORMIN (GLUCOPHAGE-XR) 500 MG 24 hr tablet Take 2 tablets (1,000 mg total) by mouth daily. 180 tablet 3  . montelukast (SINGULAIR) 10 MG tablet Take 1 tablet (10 mg total) by mouth at bedtime. 90 tablet 3  . Multiple Vitamins-Minerals (MULTIVITAMIN WITH MINERALS) tablet Take 1 tablet by mouth daily.      . nitroGLYCERIN (NITROSTAT) 0.4 MG SL tablet Place 0.4 mg under the tongue every 5 (five) minutes as needed.    Marland Kitchen  pantoprazole (PROTONIX) 40 MG tablet Take 1 tablet (40 mg total) by mouth daily. Take 30-60 min before first meal of the day 30 tablet 2  . sertraline (ZOLOFT) 50 MG tablet TAKE 1 TABLET (50 MG TOTAL) BY MOUTH DAILY. 90 tablet 3  . simvastatin (ZOCOR) 80 MG tablet Take 1 tablet (80 mg total) by mouth at bedtime. 90 tablet 3  . traMADol (ULTRAM) 50 MG tablet Take 1 tablet (50 mg total) by mouth every 12 (twelve) hours as needed. 180 tablet 1  . Zinc 50 MG CAPS Take 1 capsule by mouth.       No current  facility-administered medications on file prior to visit.    No Known Allergies  No results found for this or any previous visit (from the past 2160 hour(s)).  Objective: There were no vitals filed for this visit.  General: Patient is awake, alert, oriented x 3 and in no acute distress.  Dermatology: Skin is warm and dry bilateral with a partial thickness ulceration present  Left sub-met 1. Ulceration measures 1 cm x 0.5 cm x 0.2 cm. (last measurement 0.8 cm x 0.5 cm x 0.2 cm) There is a  Keratotic border with a granular base. The ulceration does not  probe to bone. There is no malodor, no active drainage, no erythema, no edema. No acute signs of infection.   Vascular: Dorsalis Pedis pulse = 1/4 Bilateral,  Posterior Tibial pulse = 1/4 Bilateral,  Capillary Fill Time < 5 seconds  Neurologic: Protective sensation absent bilateral using the 5.07/10g Semmes Weinstein Monofilament.  Musculosketal: There is decreased ankle joint range of motion Bilateral. There is decreased Subtalar joint range of motion Bilateral. There is a decrease in 1st metatarsophalangeal joint range of motion Bilateral. No Pain with palpation to ulcerated area. No pain with compression to calves bilateral. Muscle weakness requiring bracing bilateral.  Assessment and Plan:  Problem List Items Addressed This Visit      Nervous and Auditory   CHARCOT-MARIE-TOOTH DISEASE    Other Visit Diagnoses    Foot ulcer, left, limited to breakdown of skin ([place])    -  Primary   Type 2 diabetes, controlled, with neuropathy (HCC)       Pain of toe of left foot       Abnormality of gait         -Examined patient and discussed the progression of the wound and treatment alternatives. - Excisionally dedbrided ulceration to healthy bleeding borders using a sterile chisel  blade. -Applied offloading pad, Iodosorb and dry sterile dressing and instructed patient to continue with daily dressings at home consisting of the same -Advised  patient to return to using CAM boot that he has at home and office will arrange ordering supplies for Total contact Cast to place at next visit -Advised patient to go to the ER or return to office if the wound worsens or if constitutional symptoms are present. -Patient to return to office in 2 weeks for follow up care and evaluation or sooner if problems arise.  Landis Martins, DPM

## 2015-08-18 NOTE — Telephone Encounter (Addendum)
-----   Message from Asencion Islamitorya Stover, [place] sent at 08/18/2015 11:53 AM EDT ----- Regarding: Total Contact Cast Hi Valery Can we contact Jennifer Halloway from [employer] for Total Contact Cast- EZ. Her # is [phone] The patient has a diabetic foot ulceration that I want to use the cast on to see if we can get it healed when he comes to his appointment in 2 weeks. Thanks Dr. [name]. I spoke with Allyson SabalJennifer Holloway, she states she will email me instructions and forms to order TCCast-EZ. 08/25/2015-I asked pt multiple times what size shoe he wore on the foot Dr. [name] was going to put the cast and pt states 5.5.

## 2015-08-22 ENCOUNTER — Ambulatory Visit: Payer: Medicare Other | Admitting: Podiatry

## 2015-08-25 ENCOUNTER — Ambulatory Visit: Payer: Medicare Other | Admitting: Sports Medicine

## 2015-08-29 ENCOUNTER — Ambulatory Visit (INDEPENDENT_AMBULATORY_CARE_PROVIDER_SITE_OTHER): Payer: Medicare Other | Admitting: Endocrinology

## 2015-08-29 ENCOUNTER — Encounter: Payer: Self-pay | Admitting: Endocrinology

## 2015-08-29 VITALS — BP 122/84 | HR 67 | Ht 70.0 in | Wt 195.0 lb

## 2015-08-29 DIAGNOSIS — E538 Deficiency of other specified B group vitamins: Secondary | ICD-10-CM

## 2015-08-29 DIAGNOSIS — E1159 Type 2 diabetes mellitus with other circulatory complications: Secondary | ICD-10-CM

## 2015-08-29 LAB — POCT GLYCOSYLATED HEMOGLOBIN (HGB A1C): Hemoglobin A1C: 6

## 2015-08-29 MED ORDER — TRAMADOL HCL 50 MG PO TABS: 50.0000 mg | ORAL_TABLET | Freq: Two times a day (BID) | ORAL | 1 refills | Status: DC | PRN

## 2015-08-29 MED ORDER — CYANOCOBALAMIN 1000 MCG/ML IJ SOLN
1000.0000 ug | Freq: Once | INTRAMUSCULAR | Status: AC
  Administered 2015-08-29: 1000 ug via INTRAMUSCULAR

## 2015-08-29 NOTE — Progress Notes (Signed)
we discussed code status.  pt requests full code, but would not want to be started or maintained on artificial life-support measures if there was not a reasonable chance of recovery 

## 2015-08-29 NOTE — Patient Instructions (Addendum)
Please continue the same metformin.   Please come back for a regular physical appointment in 5 months.   Please consider these measures for your health:  minimize alcohol.  Do not use tobacco products.  Have a colonoscopy at least every 10 years from age 80.  Women should have an annual mammogram from age 80.  Keep firearms safely stored.  Always use seat belts.  have working smoke alarms in your home.  See an eye doctor and dentist regularly.  Never drive under the influence of alcohol or drugs (including prescription drugs).  Those with fair skin should take precautions against the sun, and should carefully examine their skin once per month, for any new or changed moles.  It is critically important to prevent falling down (keep floor areas well-lit, dry, and free of loose objects.  If you have a cane, walker, or wheelchair, you should use it, even for short trips around the house.  Wear flat-soled shoes.  Also, try not to rush).

## 2015-08-29 NOTE — Progress Notes (Signed)
Subjective:    Patient ID: [name], male    DOB: December 04, 1931, 82 y.o.   MRN: [id number]  HPI Pt returns for f/u of diabetes mellitus: DM type: 2 Dx'ed: 2008 Complications: CAD.  Therapy: metformin.  DKA: never.  Severe hypoglycemia: never.  Pancreatitis: never.  Other: he has never been on insulin.   Interval history: no cbg record, but states cbg's are well-controlled.  pt states he feels well in general.   Past Medical History:  Diagnosis Date  . ANEMIA, IRON DEFICIENCY   . BACK PAIN, LUMBAR   . Charcot-Marie-Tooth disease    assoiciated muscular dystrophy   . COPD (chronic obstructive pulmonary disease) (HCC)   . Coronary artery disease   . DIABETES MELLITUS, TYPE II   . GERD (gastroesophageal reflux disease)   . H/O: asbestos exposure   . Hypertension   . Leukopenia   . Pernicious anemia   . Smoker    quit 1968--25 ppy    Past Surgical History:  Procedure Laterality Date  . CORONARY ARTERY BYPASS GRAFT  1995    Social History   Social History  . Marital status: Widowed    Spouse name: N/A  . Number of children: N/A  . Years of education: N/A   Occupational History  . Retired Retired    Games developer   Social History Main Topics  . Smoking status: Former Smoker    Packs/day: 1.00    Years: 20.00    Types: Cigarettes    Quit date: 01/01/1966  . Smokeless tobacco: Never Used  . Alcohol use No  . Drug use: No  . Sexual activity: Not on file   Other Topics Concern  . Not on file   Social History Narrative   Was in service during [place] War.  Lives alone in a one story home.  On disability.            Current Outpatient Prescriptions on File Prior to Visit  Medication Sig Dispense Refill  . Ascorbic Acid (VITAMIN C) 500 MG tablet Take 500 mg by mouth 2 (two) times daily.     Marland Kitchen aspirin 325 MG tablet Take 325 mg by mouth daily.      Marland Kitchen azelastine (ASTELIN) 0.1 % nasal spray USE 1-2 SPRAYS IN EACH NOSTRIL TWICE DAILY AS NEEDED 30 mL 5  .  Blood Glucose Calibration (ADVOCATE REDI-CODE+ CONTROL) LOW SOLN   3  . Blood Glucose Monitoring Suppl (ADVOCATE REDI-CODE+) DEVI   1  . CLEVER CHEK AUTO-CODE VOICE test strip     . clotrimazole-betamethasone (LOTRISONE) cream Apply topically 2 (two) times daily as needed.     . Cyanocobalamin (VITAMIN B-12 IJ) Inject as directed. GET A B12 INJECTION ONCE EVERY MONTH    . cyanocobalamin 1000 MCG tablet Take 500 mcg by mouth 2 (two) times daily.     . famotidine (PEPCID) 20 MG tablet One at bedtime 30 tablet 2  . gabapentin (NEURONTIN) 300 MG capsule Take 1 tablet at bedtime for one week, then increase to 2 tablets at bedtime 60 capsule 11  . Inositol Niacinate (NIACIN FLUSH FREE) 500 MG CAPS Take by mouth.    Demetra Shiner [employer] (SIMPLE DIAGNOSTICS LANCING DEV) MISC   1  . metFORMIN (GLUCOPHAGE-XR) 500 MG 24 hr tablet Take 2 tablets (1,000 mg total) by mouth daily. 180 tablet 3  . montelukast (SINGULAIR) 10 MG tablet Take 1 tablet (10 mg total) by mouth at bedtime. 90 tablet 3  . Multiple  Vitamins-Minerals (MULTIVITAMIN WITH MINERALS) tablet Take 1 tablet by mouth daily.      . nitroGLYCERIN (NITROSTAT) 0.4 MG SL tablet Place 0.4 mg under the tongue every 5 (five) minutes as needed.    . pantoprazole (PROTONIX) 40 MG tablet Take 1 tablet (40 mg total) by mouth daily. Take 30-60 min before first meal of the day 30 tablet 2  . sertraline (ZOLOFT) 50 MG tablet TAKE 1 TABLET (50 MG TOTAL) BY MOUTH DAILY. 90 tablet 3  . simvastatin (ZOCOR) 80 MG tablet Take 1 tablet (80 mg total) by mouth at bedtime. 90 tablet 3  . Zinc 50 MG CAPS Take 1 capsule by mouth.       No current facility-administered medications on file prior to visit.     No Known Allergies  Family History  Problem Relation Age of Onset  . Charcot-Marie-Tooth disease Sister   . Charcot-Marie-Tooth disease Brother   . Charcot-Marie-Tooth disease Brother   . Charcot-Marie-Tooth disease Brother   . Charcot-Marie-Tooth disease Sister     . Charcot-Marie-Tooth disease Son   . Charcot-Marie-Tooth disease Other   . COPD Daughter     never smoker    BP 122/84   Pulse 67   Ht 5\' 10"  (1.778 m)   Wt 195 lb (88.5 kg)   BMI 27.98 kg/m    Review of Systems No significant weight change.     Objective:   Physical Exam VITAL SIGNS:  See vs page GENERAL: no distress Pulses: dorsalis pedis intact bilat, but reduced from normal.   MSK: no deformity of the feet CV: trace bilat leg edema.  Skin:  There is a shallow ulcer at the plantar aspect of the left foot (sees podiatry).  the skin is dry.  feet are slightly cool to touch.  Old healed surgical scar at the right leg (vein harvest).  Neuro: sensation is intact to touch on the feet, but decreased from normal.   Ext: There is bilateral onychomycosis of the toenails.    A1c=6.0%     Assessment & Plan:  Type 2 DM: well-controlled.  Please continue the same medication  Subjective:   Patient here for Medicare annual wellness visit and management of other chronic and acute problems.     Risk factors: advanced age    Roster of Physicians Providing Medical Care to Patient:  See "snapshot"   Activities of Daily Living: In your present state of health, do you have any difficulty performing the following activities?:  Preparing food and eating?: No  Bathing yourself: No  Getting dressed: No  Using the toilet: No  Moving around from place to place: No  In the past year have you fallen or had a near fall?: No    Home Safety: Has smoke detector and wears seat belts. No firearms. No excess sun exposure.    Diet and Exercise  Current exercise habits: as limited by foot ulcer and CMT disease.  Dietary issues discussed: pt reports a healthy diet   Depression Screen  Q1: Over the past two weeks, have you felt down, depressed or hopeless? no  Q2: Over the past two weeks, have you felt little interest or pleasure in doing things? no   The following portions of the patient's  history were reviewed and updated as appropriate: allergies, current medications, past family history, past medical history, past social history, past surgical history and problem list.   Review of Systems  Denies hearing loss, and visual loss Objective:   Vision:  Sees opthalmologist, but does not recall name.  Declines VA today.  Hearing: grossly normal Body mass index:  See vs page Msk: pt slowly performs "get-up-and-go" from a sitting position Cognitive Impairment Assessment: cognition, memory and judgment appear normal.  remembers 3/3 at 5 minutes.  excellent recall.  He is unable to write a sentence, due to contractures of the hands.  alert and oriented x 3.     Assessment:   Medicare wellness utd on preventive parameters.     Plan:   During the course of the visit the patient was educated and counseled about appropriate screening and preventive services including:        Fall prevention   Diabetes screening  Nutrition counseling.   Vaccines / LABS Zostavax / Pneumococcal Vaccine today.   Patient Instructions (the written plan) was given to the patient.

## 2015-08-30 DIAGNOSIS — J9610 Chronic respiratory failure, unspecified whether with hypoxia or hypercapnia: Secondary | ICD-10-CM | POA: Diagnosis not present

## 2015-08-30 DIAGNOSIS — G600 Hereditary motor and sensory neuropathy: Secondary | ICD-10-CM | POA: Diagnosis not present

## 2015-08-31 ENCOUNTER — Ambulatory Visit (INDEPENDENT_AMBULATORY_CARE_PROVIDER_SITE_OTHER): Payer: Medicare Other | Admitting: Sports Medicine

## 2015-08-31 DIAGNOSIS — E1140 Type 2 diabetes mellitus with diabetic neuropathy, unspecified: Secondary | ICD-10-CM

## 2015-08-31 DIAGNOSIS — L97521 Non-pressure chronic ulcer of other part of left foot limited to breakdown of skin: Secondary | ICD-10-CM

## 2015-08-31 DIAGNOSIS — G600 Hereditary motor and sensory neuropathy: Secondary | ICD-10-CM

## 2015-08-31 DIAGNOSIS — R269 Unspecified abnormalities of gait and mobility: Secondary | ICD-10-CM

## 2015-08-31 DIAGNOSIS — M79675 Pain in left toe(s): Secondary | ICD-10-CM

## 2015-08-31 NOTE — Progress Notes (Signed)
Patient ID: [name], male   DOB: 1931/07/17, 82 y.o.   MRN: [id number] Subjective: [name] is a 82 y.o. male patient seen in office for evaluation of ulceration of the left foot, sub-met 1 and for application of total contact cast. Patient has a history of diabetes and a blood glucose level not recorded, a1c 5-6.   Patient is changing the dressing using triple antibiotic at home/ with help of daughter. Denies nausea/fever/vomiting/chills/night sweats/shortness of breath/pain. Patient has no other pedal complaints at this time.  Patient Active Problem List   Diagnosis Date Noted  . Diabetes ([place]) 03/10/2015  . Perennial and seasonal allergic rhinitis 12/20/2014  . Hematuria 12/20/2014  . Loss of weight 05/06/2014  . Other pancytopenia ([place]) 05/05/2014  . CAP (community acquired pneumonia) 05/05/2014  . Hypocalcemia 05/05/2014  . Skin lesion 04/30/2014  . 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 Abdominal pain, right upper quadrant 05/04/2013  . Cold intolerance 05/04/2013  . Low back pain 04/03/2012  . Encounter for long-term (current) use of other medications 12/11/2011  . Routine general medical examination at a health care facility 12/17/2010  . Screening for prostate cancer 12/12/2010  . Osteoarthrosis, unspecified whether generalized or localized, unspecified site 10/24/2010  . SKIN RASH 06/29/2009  . Iron deficiency anemia 03/10/2009  . BACK PAIN, LUMBAR 03/10/2009  . [place] DISEASE 05/03/2008  . DYSPNEA 05/03/2008  . DEPRESSIVE DISORDER NOT ELSEWHERE CLASSIFIED 05/22/2007  . CHEST PAIN 05/22/2007  . ANEMIA, PERNICIOUS 12/02/2006  . HYPERTENSION 07/26/2006  . CORONARY ARTERY DISEASE 07/26/2006  . GERD 07/26/2006   Current Outpatient Prescriptions on File Prior to Visit  Medication Sig Dispense Refill  . Ascorbic Acid (VITAMIN C) 500 MG tablet Take 500 mg by mouth 2 (two) times daily.     Marland Kitchen aspirin 325 MG tablet Take 325 mg by mouth daily.       Marland Kitchen azelastine (ASTELIN) 0.1 % nasal spray USE 1-2 SPRAYS IN EACH NOSTRIL TWICE DAILY AS NEEDED 30 mL 5  . Blood Glucose Calibration (ADVOCATE REDI-CODE+ CONTROL) LOW SOLN   3  . Blood Glucose Monitoring Suppl (ADVOCATE REDI-CODE+) DEVI   1  . CLEVER CHEK AUTO-CODE VOICE test strip     . clotrimazole-betamethasone (LOTRISONE) cream Apply topically 2 (two) times daily as needed.     . Cyanocobalamin (VITAMIN B-12 IJ) Inject as directed. GET A B12 INJECTION ONCE EVERY MONTH    . cyanocobalamin 1000 MCG tablet Take 500 mcg by mouth 2 (two) times daily.     . famotidine (PEPCID) 20 MG tablet One at bedtime 30 tablet 2  . gabapentin (NEURONTIN) 300 MG capsule Take 1 tablet at bedtime for one week, then increase to 2 tablets at bedtime 60 capsule 11  . Inositol Niacinate (NIACIN FLUSH FREE) 500 MG CAPS Take by mouth.    Elmore Guise Devices (SIMPLE DIAGNOSTICS LANCING DEV) MISC   1  . metFORMIN (GLUCOPHAGE-XR) 500 MG 24 hr tablet Take 2 tablets (1,000 mg total) by mouth daily. 180 tablet 3  . montelukast (SINGULAIR) 10 MG tablet Take 1 tablet (10 mg total) by mouth at bedtime. 90 tablet 3  . Multiple Vitamins-Minerals (MULTIVITAMIN WITH MINERALS) tablet Take 1 tablet by mouth daily.      . nitroGLYCERIN (NITROSTAT) 0.4 MG SL tablet Place 0.4 mg under the tongue every 5 (five) minutes as needed.    . pantoprazole (PROTONIX) 40 MG tablet Take 1 tablet (40 mg total) by mouth daily. Take 30-60 min before first  meal of the day 30 tablet 2  . sertraline (ZOLOFT) 50 MG tablet TAKE 1 TABLET (50 MG TOTAL) BY MOUTH DAILY. 90 tablet 3  . simvastatin (ZOCOR) 80 MG tablet Take 1 tablet (80 mg total) by mouth at bedtime. 90 tablet 3  . traMADol (ULTRAM) 50 MG tablet Take 1 tablet (50 mg total) by mouth every 12 (twelve) hours as needed. 180 tablet 1  . Zinc 50 MG CAPS Take 1 capsule by mouth.       No current facility-administered medications on file prior to visit.    No Known Allergies  Recent Results (from  the past 2160 hour(s))  POCT glycosylated hemoglobin (Hb A1C)     Status: None   Collection Time: 08/29/15  2:15 PM  Result Value Ref Range   Hemoglobin A1C 6.0     Objective: There were no vitals filed for this visit.  General: Patient is awake, alert, oriented x 3 and in no acute distress.  Dermatology: Skin is warm and dry bilateral with a partial thickness ulceration present  Left sub-met 1. Ulceration measures 0.5 cm x 0.5 cm x 0.2 cm. (last measurement 1 cm x 0.5 cm x 0.2 cm) There is a  Keratotic border with a granular base. The ulceration does not  probe to bone. There is no malodor, no active drainage, no erythema, no edema. No acute signs of infection.   Vascular: Dorsalis Pedis pulse = 1/4 Bilateral,  Posterior Tibial pulse = 1/4 Bilateral,  Capillary Fill Time < 5 seconds  Neurologic: Protective sensation absent bilateral using the 5.07/10g Semmes Weinstein Monofilament.  Musculosketal: There is decreased ankle joint range of motion Bilateral. There is decreased Subtalar joint range of motion Bilateral. There is a decrease in 1st metatarsophalangeal joint range of motion Bilateral. No Pain with palpation to ulcerated area. No pain with compression to calves bilateral. Muscle weakness requiring bracing bilateral.  Assessment and Plan:  Problem List Items Addressed This Visit      Nervous and Auditory   CHARCOT-MARIE-TOOTH DISEASE    Other Visit Diagnoses    Foot ulcer, left, limited to breakdown of skin ([place])    -  Primary   Type 2 diabetes, controlled, with neuropathy (HCC)       Pain of toe of left foot       Abnormality of gait         -Examined patient and discussed the progression of the wound and treatment alternatives. - Cleansed ulceration and applied hydrofoam -Applied Total contact Cast with walking boot and educated patient on use -Advised patient to use cane for assistance and gait to provide additional stability -Advised patient to go to the ER or  return to office if the wound or cast site worsens or if constitutional symptoms are present. -Patient to return to office in 1 day to check cast and re-apply/follow up wound care and evaluation or sooner if problems arise.  Landis Martins, DPM

## 2015-09-01 ENCOUNTER — Ambulatory Visit: Payer: Medicare Other | Admitting: Sports Medicine

## 2015-09-01 ENCOUNTER — Encounter: Payer: Self-pay | Admitting: Sports Medicine

## 2015-09-01 ENCOUNTER — Ambulatory Visit (INDEPENDENT_AMBULATORY_CARE_PROVIDER_SITE_OTHER): Payer: Medicare Other | Admitting: Sports Medicine

## 2015-09-01 DIAGNOSIS — G600 Hereditary motor and sensory neuropathy: Secondary | ICD-10-CM

## 2015-09-01 DIAGNOSIS — L97521 Non-pressure chronic ulcer of other part of left foot limited to breakdown of skin: Secondary | ICD-10-CM

## 2015-09-01 DIAGNOSIS — E1140 Type 2 diabetes mellitus with diabetic neuropathy, unspecified: Secondary | ICD-10-CM | POA: Diagnosis not present

## 2015-09-01 DIAGNOSIS — R269 Unspecified abnormalities of gait and mobility: Secondary | ICD-10-CM

## 2015-09-01 DIAGNOSIS — M79675 Pain in left toe(s): Secondary | ICD-10-CM | POA: Diagnosis not present

## 2015-09-01 NOTE — Progress Notes (Signed)
Patient ID: [name], male   DOB: 1931/04/30, 82 y.o.   MRN: [id number] Subjective: [name] is a 82 y.o. male patient seen in office for evaluation of ulceration of the left foot, sub-met 1 and for changing of total contact cast. Patient has a history of diabetes and a blood glucose level not recorded, a1c 6.  Admits no major problems with calf except difficult to bend the knee. Denies nausea/fever/vomiting/chills/night sweats/shortness of breath/pain. Patient has no other pedal complaints at this time.  Patient Active Problem List   Diagnosis Date Noted  . Diabetes ([place]) 03/10/2015  . Perennial and seasonal allergic rhinitis 12/20/2014  . Hematuria 12/20/2014  . Loss of weight 05/06/2014  . Other pancytopenia ([place]) 05/05/2014  . CAP (community acquired pneumonia) 05/05/2014  . Hypocalcemia 05/05/2014  . Skin lesion 04/30/2014  . 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 Abdominal pain, right upper quadrant 05/04/2013  . Cold intolerance 05/04/2013  . Low back pain 04/03/2012  . Encounter for long-term (current) use of other medications 12/11/2011  . Routine general medical examination at a health care facility 12/17/2010  . Screening for prostate cancer 12/12/2010  . Osteoarthrosis, unspecified whether generalized or localized, unspecified site 10/24/2010  . SKIN RASH 06/29/2009  . Iron deficiency anemia 03/10/2009  . BACK PAIN, LUMBAR 03/10/2009  . [place] DISEASE 05/03/2008  . DYSPNEA 05/03/2008  . DEPRESSIVE DISORDER NOT ELSEWHERE CLASSIFIED 05/22/2007  . CHEST PAIN 05/22/2007  . ANEMIA, PERNICIOUS 12/02/2006  . HYPERTENSION 07/26/2006  . CORONARY ARTERY DISEASE 07/26/2006  . GERD 07/26/2006   Current Outpatient Prescriptions on File Prior to Visit  Medication Sig Dispense Refill  . Ascorbic Acid (VITAMIN C) 500 MG tablet Take 500 mg by mouth 2 (two) times daily.     Marland Kitchen aspirin 325 MG tablet Take 325 mg by mouth daily.      Marland Kitchen azelastine  (ASTELIN) 0.1 % nasal spray USE 1-2 SPRAYS IN EACH NOSTRIL TWICE DAILY AS NEEDED 30 mL 5  . Blood Glucose Calibration (ADVOCATE REDI-CODE+ CONTROL) LOW SOLN   3  . Blood Glucose Monitoring Suppl (ADVOCATE REDI-CODE+) DEVI   1  . CLEVER CHEK AUTO-CODE VOICE test strip     . clotrimazole-betamethasone (LOTRISONE) cream Apply topically 2 (two) times daily as needed.     . Cyanocobalamin (VITAMIN B-12 IJ) Inject as directed. GET A B12 INJECTION ONCE EVERY MONTH    . cyanocobalamin 1000 MCG tablet Take 500 mcg by mouth 2 (two) times daily.     . famotidine (PEPCID) 20 MG tablet One at bedtime 30 tablet 2  . gabapentin (NEURONTIN) 300 MG capsule Take 1 tablet at bedtime for one week, then increase to 2 tablets at bedtime 60 capsule 11  . Inositol Niacinate (NIACIN FLUSH FREE) 500 MG CAPS Take by mouth.    Elmore Guise Devices (SIMPLE DIAGNOSTICS LANCING DEV) MISC   1  . metFORMIN (GLUCOPHAGE-XR) 500 MG 24 hr tablet Take 2 tablets (1,000 mg total) by mouth daily. 180 tablet 3  . montelukast (SINGULAIR) 10 MG tablet Take 1 tablet (10 mg total) by mouth at bedtime. 90 tablet 3  . Multiple Vitamins-Minerals (MULTIVITAMIN WITH MINERALS) tablet Take 1 tablet by mouth daily.      . nitroGLYCERIN (NITROSTAT) 0.4 MG SL tablet Place 0.4 mg under the tongue every 5 (five) minutes as needed.    . pantoprazole (PROTONIX) 40 MG tablet Take 1 tablet (40 mg total) by mouth daily. Take 30-60 min before first meal of the  day 30 tablet 2  . sertraline (ZOLOFT) 50 MG tablet TAKE 1 TABLET (50 MG TOTAL) BY MOUTH DAILY. 90 tablet 3  . simvastatin (ZOCOR) 80 MG tablet Take 1 tablet (80 mg total) by mouth at bedtime. 90 tablet 3  . traMADol (ULTRAM) 50 MG tablet Take 1 tablet (50 mg total) by mouth every 12 (twelve) hours as needed. 180 tablet 1  . Zinc 50 MG CAPS Take 1 capsule by mouth.       No current facility-administered medications on file prior to visit.    No Known Allergies  Recent Results (from the past 2160  hour(s))  POCT glycosylated hemoglobin (Hb A1C)     Status: None   Collection Time: 08/29/15  2:15 PM  Result Value Ref Range   Hemoglobin A1C 6.0     Objective: There were no vitals filed for this visit.  General: Patient is awake, alert, oriented x 3 and in no acute distress.  Dermatology: Skin is warm and dry bilateral with a partial thickness ulceration present  Left sub-met 1. Ulceration measures 0.5 cm x 0.5 cm x 0.2 cm. (last measurement Same) There is a Keratotic border with a granular base. The ulceration does not  probe to bone. There is no malodor, no active drainage, no erythema, no edema. No acute signs of infection.   Vascular: Dorsalis Pedis pulse = 1/4 Bilateral,  Posterior Tibial pulse = 1/4 Bilateral,  Capillary Fill Time < 5 seconds  Neurologic: Protective sensation absent bilateral using the 5.07/10g Semmes Weinstein Monofilament.  Musculosketal: There is decreased ankle joint range of motion Bilateral. There is decreased Subtalar joint range of motion Bilateral. There is a decrease in 1st metatarsophalangeal joint range of motion Bilateral. No Pain with palpation to ulcerated area. No pain with compression to calves bilateral. Muscle weakness requiring bracing bilateral from Charcot-Marie-Tooth.  Assessment and Plan:  Problem List Items Addressed This Visit      Nervous and Auditory   CHARCOT-MARIE-TOOTH DISEASE    Other Visit Diagnoses    Foot ulcer, left, limited to breakdown of skin ([place])    -  Primary   Type 2 diabetes, controlled, with neuropathy (HCC)       Pain of toe of left foot       Abnormality of gait         -Examined patient and discussed the progression of the wound and treatment alternatives. -Removed total contact cast in preparation for cast reapplication and change - Cleansed ulceration and applied hydrofoam -Applied Total contact Cast with walking boot and educated patient on use -Advised patient to use cane/electric wheelchair/walker  for assistance and gait to provide additional stability -Advised patient to go to the ER or return to office if the wound or cast site worsens or if constitutional symptoms are present. -Patient to return to office in 1 week to check cast and re-apply/follow up wound care and evaluation or sooner if problems arise.  Landis Martins, DPM

## 2015-09-08 ENCOUNTER — Encounter: Payer: Self-pay | Admitting: Sports Medicine

## 2015-09-08 ENCOUNTER — Ambulatory Visit (INDEPENDENT_AMBULATORY_CARE_PROVIDER_SITE_OTHER): Payer: Medicare Other | Admitting: Sports Medicine

## 2015-09-08 DIAGNOSIS — L97521 Non-pressure chronic ulcer of other part of left foot limited to breakdown of skin: Secondary | ICD-10-CM | POA: Diagnosis not present

## 2015-09-08 DIAGNOSIS — M79675 Pain in left toe(s): Secondary | ICD-10-CM

## 2015-09-08 DIAGNOSIS — E1140 Type 2 diabetes mellitus with diabetic neuropathy, unspecified: Secondary | ICD-10-CM | POA: Diagnosis not present

## 2015-09-08 DIAGNOSIS — G600 Hereditary motor and sensory neuropathy: Secondary | ICD-10-CM

## 2015-09-08 NOTE — Progress Notes (Signed)
Patient ID: [name], male   DOB: 26-Dec-1931, 82 y.o.   MRN: [id number] Subjective: [name] is a 82 y.o. male patient seen in office for evaluation of ulceration of the left foot, sub-met 1 and for changing of total contact cast. This is cast #2. Patient has a history of diabetes and a blood glucose level not recorded, a1c 6.  Admits no major problems with cast except difficult to walk because he is use to his braces that he uses for CMT. Denies nausea/fever/vomiting/chills/night sweats/shortness of breath/pain. Patient has no other pedal complaints at this time.  Patient Active Problem List   Diagnosis Date Noted  . Diabetes ([place]) 03/10/2015  . Perennial and seasonal allergic rhinitis 12/20/2014  . Hematuria 12/20/2014  . Loss of weight 05/06/2014  . Other pancytopenia ([place]) 05/05/2014  . CAP (community acquired pneumonia) 05/05/2014  . Hypocalcemia 05/05/2014  . Skin lesion 04/30/2014  . 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 Abdominal pain, right upper quadrant 05/04/2013  . Cold intolerance 05/04/2013  . Low back pain 04/03/2012  . Encounter for long-term (current) use of other medications 12/11/2011  . Routine general medical examination at a health care facility 12/17/2010  . Screening for prostate cancer 12/12/2010  . Osteoarthrosis, unspecified whether generalized or localized, unspecified site 10/24/2010  . SKIN RASH 06/29/2009  . Iron deficiency anemia 03/10/2009  . BACK PAIN, LUMBAR 03/10/2009  . [place] DISEASE 05/03/2008  . DYSPNEA 05/03/2008  . DEPRESSIVE DISORDER NOT ELSEWHERE CLASSIFIED 05/22/2007  . CHEST PAIN 05/22/2007  . ANEMIA, PERNICIOUS 12/02/2006  . HYPERTENSION 07/26/2006  . CORONARY ARTERY DISEASE 07/26/2006  . GERD 07/26/2006   Current Outpatient Prescriptions on File Prior to Visit  Medication Sig Dispense Refill  . Ascorbic Acid (VITAMIN C) 500 MG tablet Take 500 mg by mouth 2 (two) times daily.     Marland Kitchen aspirin 325 MG  tablet Take 325 mg by mouth daily.      Marland Kitchen azelastine (ASTELIN) 0.1 % nasal spray USE 1-2 SPRAYS IN EACH NOSTRIL TWICE DAILY AS NEEDED 30 mL 5  . Blood Glucose Calibration (ADVOCATE REDI-CODE+ CONTROL) LOW SOLN   3  . Blood Glucose Monitoring Suppl (ADVOCATE REDI-CODE+) DEVI   1  . CLEVER CHEK AUTO-CODE VOICE test strip     . clotrimazole-betamethasone (LOTRISONE) cream Apply topically 2 (two) times daily as needed.     . Cyanocobalamin (VITAMIN B-12 IJ) Inject as directed. GET A B12 INJECTION ONCE EVERY MONTH    . cyanocobalamin 1000 MCG tablet Take 500 mcg by mouth 2 (two) times daily.     . famotidine (PEPCID) 20 MG tablet One at bedtime 30 tablet 2  . gabapentin (NEURONTIN) 300 MG capsule Take 1 tablet at bedtime for one week, then increase to 2 tablets at bedtime 60 capsule 11  . Inositol Niacinate (NIACIN FLUSH FREE) 500 MG CAPS Take by mouth.    Elmore Guise Devices (SIMPLE DIAGNOSTICS LANCING DEV) MISC   1  . metFORMIN (GLUCOPHAGE-XR) 500 MG 24 hr tablet Take 2 tablets (1,000 mg total) by mouth daily. 180 tablet 3  . montelukast (SINGULAIR) 10 MG tablet Take 1 tablet (10 mg total) by mouth at bedtime. 90 tablet 3  . Multiple Vitamins-Minerals (MULTIVITAMIN WITH MINERALS) tablet Take 1 tablet by mouth daily.      . nitroGLYCERIN (NITROSTAT) 0.4 MG SL tablet Place 0.4 mg under the tongue every 5 (five) minutes as needed.    . pantoprazole (PROTONIX) 40 MG tablet Take 1 tablet (  40 mg total) by mouth daily. Take 30-60 min before first meal of the day 30 tablet 2  . sertraline (ZOLOFT) 50 MG tablet TAKE 1 TABLET (50 MG TOTAL) BY MOUTH DAILY. 90 tablet 3  . simvastatin (ZOCOR) 80 MG tablet Take 1 tablet (80 mg total) by mouth at bedtime. 90 tablet 3  . traMADol (ULTRAM) 50 MG tablet Take 1 tablet (50 mg total) by mouth every 12 (twelve) hours as needed. 180 tablet 1  . Zinc 50 MG CAPS Take 1 capsule by mouth.       No current facility-administered medications on file prior to visit.    No Known  Allergies  Recent Results (from the past 2160 hour(s))  POCT glycosylated hemoglobin (Hb A1C)     Status: None   Collection Time: 08/29/15  2:15 PM  Result Value Ref Range   Hemoglobin A1C 6.0     Objective: There were no vitals filed for this visit.  General: Patient is awake, alert, oriented x 3 and in no acute distress.  Dermatology: Skin is warm and dry bilateral with now healed ulceration present  Left sub-met 1; Ulceration is prematurely healed (last measurement 0.5cm x 0.5cm x 0.2cm) There is keratosis to the site, no malodor, no active drainage, no erythema, no edema. No acute signs of infection.   Vascular: Dorsalis Pedis pulse = 1/4 Bilateral,  Posterior Tibial pulse = 1/4 Bilateral,  Capillary Fill Time < 5 seconds  Neurologic: Protective sensation absent bilateral using the 5.07/10g Semmes Weinstein Monofilament.  Musculosketal: There is decreased ankle joint range of motion Bilateral. There is decreased Subtalar joint range of motion Bilateral. There is a decrease in 1st metatarsophalangeal joint range of motion Bilateral. No Pain with palpation to pre-ulcerative area. No pain with compression to calves bilateral. Muscle weakness requiring bracing bilateral from Charcot-Marie-Tooth.  Assessment and Plan:  Problem List Items Addressed This Visit      Nervous and Auditory   CHARCOT-MARIE-TOOTH DISEASE   Relevant Medications   gabapentin (NEURONTIN) 300 MG capsule    Other Visit Diagnoses    Foot ulcer, left, limited to breakdown of skin ([place])    -  Primary   Prematurely healed   Type 2 diabetes, controlled, with neuropathy (HCC)       Relevant Medications   aspirin (GOODSENSE ASPIRIN) 325 MG tablet   metFORMIN (GLUCOPHAGE) 500 MG tablet   Pain of toe of left foot          -Examined patient and discussed the progression of the healed wound and treatment alternatives. -Removed total contact cast  -Ulceration is healed, debrided surrounding keratosis sub met 1 on  left and applied protective offloading pad and antibiotic cream; advised patient's daughter to continue with the same daily at home -Patient to return to office in 1 week to check healed ulceration site or sooner if problems arise.  Landis Martins, DPM

## 2015-09-15 ENCOUNTER — Encounter: Payer: Self-pay | Admitting: Sports Medicine

## 2015-09-15 ENCOUNTER — Ambulatory Visit (INDEPENDENT_AMBULATORY_CARE_PROVIDER_SITE_OTHER): Payer: Medicare Other | Admitting: Sports Medicine

## 2015-09-15 VITALS — Wt 180.0 lb

## 2015-09-15 DIAGNOSIS — R269 Unspecified abnormalities of gait and mobility: Secondary | ICD-10-CM

## 2015-09-15 DIAGNOSIS — M79675 Pain in left toe(s): Secondary | ICD-10-CM

## 2015-09-15 DIAGNOSIS — E1140 Type 2 diabetes mellitus with diabetic neuropathy, unspecified: Secondary | ICD-10-CM

## 2015-09-15 DIAGNOSIS — L97521 Non-pressure chronic ulcer of other part of left foot limited to breakdown of skin: Secondary | ICD-10-CM

## 2015-09-15 DIAGNOSIS — G600 Hereditary motor and sensory neuropathy: Secondary | ICD-10-CM

## 2015-09-15 NOTE — Progress Notes (Signed)
Patient ID: [name], male   DOB: [birth year], 82 y.o.   MRN: [id number] Subjective: [name] is a 82 y.o. male patient seen in office for follow up evaluation of ulceration of the left foot, sub-met 1 that was healed last visit; daughter has been applying protective offloading dressing to site.  Patient has a history of diabetes and a blood glucose level not recorded, a1c 6, unchanged from prior. Denies nausea/fever/vomiting/chills/night sweats/shortness of breath/pain. Patient has no other pedal complaints at this time.  Patient Active Problem List   Diagnosis Date Noted  . Diabetes ([place]) 03/10/2015  . Perennial and seasonal allergic rhinitis 12/20/2014  . Hematuria 12/20/2014  . Loss of weight 05/06/2014  . Other pancytopenia ([place]) 05/05/2014  . CAP (community acquired pneumonia) 05/05/2014  . Hypocalcemia 05/05/2014  . Skin lesion 04/30/2014  . 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 Abdominal pain, right upper quadrant 05/04/2013  . Cold intolerance 05/04/2013  . Low back pain 04/03/2012  . Encounter for long-term (current) use of other medications 12/11/2011  . Routine general medical examination at a health care facility 12/17/2010  . Screening for prostate cancer 12/12/2010  . Osteoarthrosis, unspecified whether generalized or localized, unspecified site 10/24/2010  . SKIN RASH 06/29/2009  . Iron deficiency anemia 03/10/2009  . BACK PAIN, LUMBAR 03/10/2009  . [place] DISEASE 05/03/2008  . DYSPNEA 05/03/2008  . DEPRESSIVE DISORDER NOT ELSEWHERE CLASSIFIED 05/22/2007  . CHEST PAIN 05/22/2007  . ANEMIA, PERNICIOUS 12/02/2006  . HYPERTENSION 07/26/2006  . CORONARY ARTERY DISEASE 07/26/2006  . GERD 07/26/2006   Current Outpatient Prescriptions on File Prior to Visit  Medication Sig Dispense Refill  . Ascorbic Acid (VITAMIN C) 500 MG tablet Take 500 mg by mouth 2 (two) times daily.     Marland Kitchen aspirin (GOODSENSE ASPIRIN) 325 MG tablet Take 325 mg by  mouth.    Marland Kitchen aspirin 325 MG tablet Take 325 mg by mouth daily.      Marland Kitchen azelastine (ASTELIN) 0.1 % nasal spray USE 1-2 SPRAYS IN EACH NOSTRIL TWICE DAILY AS NEEDED 30 mL 5  . Blood Glucose Calibration (ADVOCATE REDI-CODE+ CONTROL) LOW SOLN   3  . Blood Glucose Monitoring Suppl (ADVOCATE REDI-CODE+) DEVI   1  . CLEVER CHEK AUTO-CODE VOICE test strip     . clotrimazole-betamethasone (LOTRISONE) cream Apply topically 2 (two) times daily as needed.     . Cyanocobalamin (VITAMIN B-12 IJ) Inject as directed. GET A B12 INJECTION ONCE EVERY MONTH    . cyanocobalamin 1000 MCG tablet Take 500 mcg by mouth 2 (two) times daily.     . famotidine (PEPCID) 20 MG tablet One at bedtime 30 tablet 2  . gabapentin (NEURONTIN) 300 MG capsule Take 1 tablet at bedtime for one week, then increase to 2 tablets at bedtime 60 capsule 11  . gabapentin (NEURONTIN) 300 MG capsule 1 or 2 pills by mouth at night as needed for leg pain    . Inositol Niacinate (NIACIN FLUSH FREE) 500 MG CAPS Take by mouth.    Elmore Guise Devices (SIMPLE DIAGNOSTICS LANCING DEV) MISC   1  . metFORMIN (GLUCOPHAGE) 500 MG tablet Take 500 mg by mouth.    . metFORMIN (GLUCOPHAGE-XR) 500 MG 24 hr tablet Take 2 tablets (1,000 mg total) by mouth daily. 180 tablet 3  . montelukast (SINGULAIR) 10 MG tablet Take 1 tablet (10 mg total) by mouth at bedtime. 90 tablet 3  . Multiple Vitamins-Minerals (MULTIVITAMIN WITH MINERALS) tablet Take 1 tablet by mouth  daily.      . nitroGLYCERIN (NITROSTAT) 0.4 MG SL tablet Place 0.4 mg under the tongue every 5 (five) minutes as needed.    . pantoprazole (PROTONIX) 40 MG tablet Take 1 tablet (40 mg total) by mouth daily. Take 30-60 min before first meal of the day 30 tablet 2  . sertraline (ZOLOFT) 50 MG tablet TAKE 1 TABLET (50 MG TOTAL) BY MOUTH DAILY. 90 tablet 3  . simvastatin (ZOCOR) 80 MG tablet Take 1 tablet (80 mg total) by mouth at bedtime. 90 tablet 3  . traMADol (ULTRAM) 50 MG tablet Take 1 tablet (50 mg total) by  mouth every 12 (twelve) hours as needed. 180 tablet 1  . Zinc 50 MG CAPS Take 1 capsule by mouth.       No current facility-administered medications on file prior to visit.    No Known Allergies  Recent Results (from the past 2160 hour(s))  POCT glycosylated hemoglobin (Hb A1C)     Status: None   Collection Time: 08/29/15  2:15 PM  Result Value Ref Range   Hemoglobin A1C 6.0     Objective: Vitals:   09/15/15 1536  Weight: 180 lb (81.6 kg)    General: Patient is awake, alert, oriented x 3 and in no acute distress.  Dermatology: Skin is warm and dry bilateral a continued healed ulceration present Left sub-met 1; Ulceration is prematurely healed; There is keratosis to the site with no underlying opening, no malodor, no active drainage, no erythema, no edema. No acute signs of infection.   Vascular: Dorsalis Pedis pulse = 1/4 Bilateral,  Posterior Tibial pulse = 1/4 Bilateral,  Capillary Fill Time < 5 seconds  Neurologic: Protective sensation absent bilateral using the 5.07/10g Semmes Weinstein Monofilament.  Musculosketal: There is decreased ankle joint range of motion Bilateral. There is decreased Subtalar joint range of motion Bilateral. There is a decrease in 1st metatarsophalangeal joint range of motion Bilateral. No Pain with palpation to pre-ulcerative area. No pain with compression to calves bilateral. Hammertoes with Muscle weakness requiring bracing bilateral from Charcot-Marie-Tooth.  Assessment and Plan:  Problem List Items Addressed This Visit      Nervous and Auditory   CHARCOT-MARIE-TOOTH DISEASE    Other Visit Diagnoses    Foot ulcer, left, limited to breakdown of skin ([place])    -  Primary   prematurely healed   Type 2 diabetes, controlled, with neuropathy (HCC)       Pain of toe of left foot       Abnormality of gait         -Examined patient and discussed the progression of the continued healed wound and treatment alternatives. -Ulceration healed no dressings  needed -Advised patient to closely monitor site if re-opens to redressing with offloading pad and iodosorb and return to office or ER immediately -Continue with braces and custom shoes with offloading inserts -Patient to return to office in 3 weeks for final check healed ulceration site or sooner if problems arise.  Landis Martins, DPM

## 2015-09-30 DIAGNOSIS — J9610 Chronic respiratory failure, unspecified whether with hypoxia or hypercapnia: Secondary | ICD-10-CM | POA: Diagnosis not present

## 2015-10-05 ENCOUNTER — Encounter: Payer: Self-pay | Admitting: Sports Medicine

## 2015-10-05 ENCOUNTER — Ambulatory Visit (INDEPENDENT_AMBULATORY_CARE_PROVIDER_SITE_OTHER): Payer: Medicare Other | Admitting: Sports Medicine

## 2015-10-05 DIAGNOSIS — M79672 Pain in left foot: Secondary | ICD-10-CM

## 2015-10-05 DIAGNOSIS — L03116 Cellulitis of left lower limb: Secondary | ICD-10-CM | POA: Diagnosis not present

## 2015-10-05 DIAGNOSIS — L02619 Cutaneous abscess of unspecified foot: Secondary | ICD-10-CM | POA: Diagnosis not present

## 2015-10-05 DIAGNOSIS — E1140 Type 2 diabetes mellitus with diabetic neuropathy, unspecified: Secondary | ICD-10-CM

## 2015-10-05 DIAGNOSIS — L02612 Cutaneous abscess of left foot: Secondary | ICD-10-CM | POA: Diagnosis not present

## 2015-10-05 DIAGNOSIS — L97521 Non-pressure chronic ulcer of other part of left foot limited to breakdown of skin: Secondary | ICD-10-CM | POA: Diagnosis not present

## 2015-10-05 DIAGNOSIS — L03119 Cellulitis of unspecified part of limb: Secondary | ICD-10-CM

## 2015-10-05 DIAGNOSIS — L89891 Pressure ulcer of other site, stage 1: Secondary | ICD-10-CM | POA: Diagnosis not present

## 2015-10-05 DIAGNOSIS — G600 Hereditary motor and sensory neuropathy: Secondary | ICD-10-CM

## 2015-10-05 MED ORDER — AMOXICILLIN-POT CLAVULANATE 875-125 MG PO TABS: 1.0000 | ORAL_TABLET | Freq: Two times a day (BID) | ORAL | 0 refills | Status: DC

## 2015-10-05 NOTE — Progress Notes (Signed)
Patient ID: [name], male   DOB: 1931-08-27, 82 y.o.   MRN: [id number] Subjective: [name] is a 82 y.o. male patient seen in office for follow up evaluation of ulceration of the left foot, sub-met 1 that was healed last visit, however over the last few days noticed warmth and redness to foot ; daughter has been applying protective offloading dressing to site.  Patient has a history of diabetes and a blood glucose level not recorded, a1c 6, unchanged from prior. Denies nausea/fever/vomiting/chills/night sweats/shortness of breath/pain. Patient has no other pedal complaints at this time.  Patient Active Problem List   Diagnosis Date Noted  . Diabetes ([place]) 03/10/2015  . Perennial and seasonal allergic rhinitis 12/20/2014  . Hematuria 12/20/2014  . Loss of weight 05/06/2014  . Other pancytopenia ([place]) 05/05/2014  . CAP (community acquired pneumonia) 05/05/2014  . Hypocalcemia 05/05/2014  . Skin lesion 04/30/2014  . 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 Abdominal pain, right upper quadrant 05/04/2013  . Cold intolerance 05/04/2013  . Low back pain 04/03/2012  . Encounter for long-term (current) use of other medications 12/11/2011  . Routine general medical examination at a health care facility 12/17/2010  . Screening for prostate cancer 12/12/2010  . Osteoarthrosis, unspecified whether generalized or localized, unspecified site 10/24/2010  . SKIN RASH 06/29/2009  . Iron deficiency anemia 03/10/2009  . BACK PAIN, LUMBAR 03/10/2009  . [place] DISEASE 05/03/2008  . DYSPNEA 05/03/2008  . DEPRESSIVE DISORDER NOT ELSEWHERE CLASSIFIED 05/22/2007  . CHEST PAIN 05/22/2007  . ANEMIA, PERNICIOUS 12/02/2006  . HYPERTENSION 07/26/2006  . CORONARY ARTERY DISEASE 07/26/2006  . GERD 07/26/2006   Current Outpatient Prescriptions on File Prior to Visit  Medication Sig Dispense Refill  . Ascorbic Acid (VITAMIN C) 500 MG tablet Take 500 mg by mouth 2 (two) times  daily.     Marland Kitchen aspirin (GOODSENSE ASPIRIN) 325 MG tablet Take 325 mg by mouth.    Marland Kitchen aspirin 325 MG tablet Take 325 mg by mouth daily.      Marland Kitchen azelastine (ASTELIN) 0.1 % nasal spray USE 1-2 SPRAYS IN EACH NOSTRIL TWICE DAILY AS NEEDED 30 mL 5  . Blood Glucose Calibration (ADVOCATE REDI-CODE+ CONTROL) LOW SOLN   3  . Blood Glucose Monitoring Suppl (ADVOCATE REDI-CODE+) DEVI   1  . CLEVER CHEK AUTO-CODE VOICE test strip     . clotrimazole-betamethasone (LOTRISONE) cream Apply topically 2 (two) times daily as needed.     . Cyanocobalamin (VITAMIN B-12 IJ) Inject as directed. GET A B12 INJECTION ONCE EVERY MONTH    . cyanocobalamin 1000 MCG tablet Take 500 mcg by mouth 2 (two) times daily.     . famotidine (PEPCID) 20 MG tablet One at bedtime 30 tablet 2  . gabapentin (NEURONTIN) 300 MG capsule Take 1 tablet at bedtime for one week, then increase to 2 tablets at bedtime 60 capsule 11  . gabapentin (NEURONTIN) 300 MG capsule 1 or 2 pills by mouth at night as needed for leg pain    . Inositol Niacinate (NIACIN FLUSH FREE) 500 MG CAPS Take by mouth.    Elmore Guise Devices (SIMPLE DIAGNOSTICS LANCING DEV) MISC   1  . metFORMIN (GLUCOPHAGE) 500 MG tablet Take 500 mg by mouth.    . metFORMIN (GLUCOPHAGE-XR) 500 MG 24 hr tablet Take 2 tablets (1,000 mg total) by mouth daily. 180 tablet 3  . montelukast (SINGULAIR) 10 MG tablet Take 1 tablet (10 mg total) by mouth at bedtime. 90 tablet 3  .  Multiple Vitamins-Minerals (MULTIVITAMIN WITH MINERALS) tablet Take 1 tablet by mouth daily.      . nitroGLYCERIN (NITROSTAT) 0.4 MG SL tablet Place 0.4 mg under the tongue every 5 (five) minutes as needed.    . pantoprazole (PROTONIX) 40 MG tablet Take 1 tablet (40 mg total) by mouth daily. Take 30-60 min before first meal of the day 30 tablet 2  . sertraline (ZOLOFT) 50 MG tablet TAKE 1 TABLET (50 MG TOTAL) BY MOUTH DAILY. 90 tablet 3  . simvastatin (ZOCOR) 80 MG tablet Take 1 tablet (80 mg total) by mouth at bedtime. 90  tablet 3  . traMADol (ULTRAM) 50 MG tablet Take 1 tablet (50 mg total) by mouth every 12 (twelve) hours as needed. 180 tablet 1  . Zinc 50 MG CAPS Take 1 capsule by mouth.       No current facility-administered medications on file prior to visit.    No Known Allergies  Recent Results (from the past 2160 hour(s))  POCT glycosylated hemoglobin (Hb A1C)     Status: None   Collection Time: 08/29/15  2:15 PM  Result Value Ref Range   Hemoglobin A1C 6.0     Objective: There were no vitals filed for this visit.  General: Patient is awake, alert, oriented x 3 and in no acute distress.  Dermatology: Skin is warm and dry bilateral a with a partial thickness ulceration present Left sub-met 1; with granular base 1x1cm with small amount puss that was cultured; There is keratosis periwound, no malodor,  + erythema, + edema focal to forefoot with warm. No other acute signs of infection.   Vascular: Dorsalis Pedis pulse = 1/4 Bilateral,  Posterior Tibial pulse = 1/4 Bilateral,  Capillary Fill Time < 5 seconds  Neurologic: Protective sensation absent bilateral using the 5.07/10g Semmes Weinstein Monofilament.  Musculosketal: There is decreased ankle joint range of motion Bilateral. There is decreased Subtalar joint range of motion Bilateral. There is a decrease in 1st metatarsophalangeal joint range of motion Bilateral. No Pain with palpation to ulcer area, left foot. No pain with compression to calves bilateral. Hammertoes with Muscle weakness requiring bracing bilateral from Charcot-Marie-Tooth.  Assessment and Plan:  Problem List Items Addressed This Visit      Nervous and Auditory   CHARCOT-MARIE-TOOTH DISEASE   Relevant Medications   amoxicillin-clavulanate (AUGMENTIN) 875-125 MG tablet   Other Relevant Orders   CBC with Differential   Basic Metabolic Panel   Sedimentation Rate   C-reactive protein   Uric Acid   WOUND CULTURE    Other Visit Diagnoses    Cellulitis and abscess of foot,  except toes    -  Primary   Relevant Medications   amoxicillin-clavulanate (AUGMENTIN) 875-125 MG tablet   Other Relevant Orders   CBC with Differential   Basic Metabolic Panel   Sedimentation Rate   C-reactive protein   Uric Acid   WOUND CULTURE   Foot ulcer, left, limited to breakdown of skin (HCC)       Relevant Medications   amoxicillin-clavulanate (AUGMENTIN) 875-125 MG tablet   Other Relevant Orders   CBC with Differential   Basic Metabolic Panel   Sedimentation Rate   C-reactive protein   Uric Acid   WOUND CULTURE   Type 2 diabetes, controlled, with neuropathy (HCC)       Relevant Medications   amoxicillin-clavulanate (AUGMENTIN) 875-125 MG tablet   Other Relevant Orders   CBC with Differential   Basic Metabolic Panel   Sedimentation Rate  C-reactive protein   Uric Acid   WOUND CULTURE   Left foot pain       Relevant Medications   amoxicillin-clavulanate (AUGMENTIN) 875-125 MG tablet   Other Relevant Orders   CBC with Differential   Basic Metabolic Panel   Sedimentation Rate   C-reactive protein   Uric Acid   WOUND CULTURE     -Examined patient and discussed the progression of the wound and treatment alternatives. -Ulceration debrided using sterile chisel blade and wound culture obtained. Applied offloading pad and iodosorb and advised daughter to do the same. -Rx Augmentin -Rx labs for review if concerning or no improvement with oral antibiotics to return to office or ER immediately -Continue with braces and custom shoes with offloading inserts -Patient to return to office in 1 week for follow up ulceration care or sooner if problems arise.  Landis Martins, DPM

## 2015-10-06 ENCOUNTER — Telehealth: Payer: Self-pay | Admitting: Sports Medicine

## 2015-10-06 LAB — BASIC METABOLIC PANEL
BUN/Creatinine Ratio: 25 — ABNORMAL HIGH (ref 10–24)
BUN: 19 mg/dL (ref 8–27)
CALCIUM: 9.3 mg/dL (ref 8.6–10.2)
CHLORIDE: 102 mmol/L (ref 96–106)
CO2: 25 mmol/L (ref 18–29)
Creatinine, Ser: 0.76 mg/dL (ref 0.76–1.27)
GFR calc Af Amer: 99 mL/min/{1.73_m2} (ref >59)
GFR, EST NON AFRICAN AMERICAN: 86 mL/min/{1.73_m2} (ref >59)
GLUCOSE: 109 mg/dL — AB (ref 65–99)
POTASSIUM: 5 mmol/L (ref 3.5–5.2)
SODIUM: 140 mmol/L (ref 134–144)

## 2015-10-06 LAB — CBC WITH DIFFERENTIAL/PLATELET
BASOS ABS: 0 10*3/uL (ref 0.0–0.2)
Basos: 0 %
EOS (ABSOLUTE): 0.1 10*3/uL (ref 0.0–0.4)
Eos: 1 %
Hematocrit: 34 % — ABNORMAL LOW (ref 37.5–51.0)
Hemoglobin: 11.2 g/dL — ABNORMAL LOW (ref 12.6–17.7)
IMMATURE GRANULOCYTES: 0 %
Immature Grans (Abs): 0 10*3/uL (ref 0.0–0.1)
LYMPHS ABS: 0.9 10*3/uL (ref 0.7–3.1)
Lymphs: 14 %
MCH: 30.1 pg (ref 26.6–33.0)
MCHC: 32.9 g/dL (ref 31.5–35.7)
MCV: 91 fL (ref 79–97)
MONOS ABS: 0.8 10*3/uL (ref 0.1–0.9)
Monocytes: 13 %
NEUTROS PCT: 72 %
Neutrophils Absolute: 4.6 10*3/uL (ref 1.4–7.0)
PLATELETS: 178 10*3/uL (ref 150–379)
RBC: 3.72 x10E6/uL — AB (ref 4.14–5.80)
RDW: 13.1 % (ref 12.3–15.4)
WBC: 6.4 10*3/uL (ref 3.4–10.8)

## 2015-10-06 LAB — URIC ACID: URIC ACID: 4.6 mg/dL (ref 3.7–8.6)

## 2015-10-06 LAB — C-REACTIVE PROTEIN: CRP: 4.5 mg/L (ref 0.0–4.9)

## 2015-10-06 LAB — SEDIMENTATION RATE: SED RATE: 21 mm/h (ref 0–30)

## 2015-10-06 NOTE — Telephone Encounter (Signed)
Discussed lab results with patient. Patient denies symptoms this AM and is taking Augmentin. Labs within normal range for patient. Patient to follow up as scheduled next week. -Dr. [name]

## 2015-10-12 ENCOUNTER — Encounter: Payer: Self-pay | Admitting: Sports Medicine

## 2015-10-12 ENCOUNTER — Ambulatory Visit (INDEPENDENT_AMBULATORY_CARE_PROVIDER_SITE_OTHER): Payer: Medicare Other | Admitting: Sports Medicine

## 2015-10-12 DIAGNOSIS — L02619 Cutaneous abscess of unspecified foot: Secondary | ICD-10-CM

## 2015-10-12 DIAGNOSIS — M79672 Pain in left foot: Secondary | ICD-10-CM

## 2015-10-12 DIAGNOSIS — L03119 Cellulitis of unspecified part of limb: Secondary | ICD-10-CM

## 2015-10-12 DIAGNOSIS — L97521 Non-pressure chronic ulcer of other part of left foot limited to breakdown of skin: Secondary | ICD-10-CM

## 2015-10-12 DIAGNOSIS — L89891 Pressure ulcer of other site, stage 1: Secondary | ICD-10-CM | POA: Diagnosis not present

## 2015-10-12 DIAGNOSIS — E1140 Type 2 diabetes mellitus with diabetic neuropathy, unspecified: Secondary | ICD-10-CM

## 2015-10-12 DIAGNOSIS — G600 Hereditary motor and sensory neuropathy: Secondary | ICD-10-CM

## 2015-10-12 MED ORDER — CADEXOMER IODINE 0.9 % EX GEL: 1.0000 "application " | Freq: Every day | CUTANEOUS | 0 refills | Status: DC | PRN

## 2015-10-12 NOTE — Progress Notes (Signed)
Patient ID: [name], male   DOB: April 07, 1931, 82 y.o.   MRN: [id number] Subjective: [name] is a 82 y.o. male patient seen in office for follow up evaluation of ulceration of the left foot, sub-met 1; daughter has been applying iodosorb and offloading dressing to site.  Patient has a history of diabetes and a blood glucose level not recorded, a1c 6, unchanged from prior. Patient is taking Augmentin with no issues for + wound culture. Denies nausea/fever/vomiting/chills/night sweats/shortness of breath/pain. Patient has no other pedal complaints at this time.  Patient Active Problem List   Diagnosis Date Noted  . Diabetes ([place]) 03/10/2015  . Perennial and seasonal allergic rhinitis 12/20/2014  . Hematuria 12/20/2014  . Loss of weight 05/06/2014  . Other pancytopenia ([place]) 05/05/2014  . CAP (community acquired pneumonia) 05/05/2014  . Hypocalcemia 05/05/2014  . Skin lesion 04/30/2014  . 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 Abdominal pain, right upper quadrant 05/04/2013  . Cold intolerance 05/04/2013  . Low back pain 04/03/2012  . Encounter for long-term (current) use of other medications 12/11/2011  . Routine general medical examination at a health care facility 12/17/2010  . Screening for prostate cancer 12/12/2010  . Osteoarthrosis, unspecified whether generalized or localized, unspecified site 10/24/2010  . SKIN RASH 06/29/2009  . Iron deficiency anemia 03/10/2009  . BACK PAIN, LUMBAR 03/10/2009  . [place] DISEASE 05/03/2008  . DYSPNEA 05/03/2008  . DEPRESSIVE DISORDER NOT ELSEWHERE CLASSIFIED 05/22/2007  . CHEST PAIN 05/22/2007  . ANEMIA, PERNICIOUS 12/02/2006  . HYPERTENSION 07/26/2006  . CORONARY ARTERY DISEASE 07/26/2006  . GERD 07/26/2006   Current Outpatient Prescriptions on File Prior to Visit  Medication Sig Dispense Refill  . amoxicillin-clavulanate (AUGMENTIN) 875-125 MG tablet Take 1 tablet by mouth 2 (two) times daily. 28 tablet  0  . Ascorbic Acid (VITAMIN C) 500 MG tablet Take 500 mg by mouth 2 (two) times daily.     Marland Kitchen aspirin (GOODSENSE ASPIRIN) 325 MG tablet Take 325 mg by mouth.    Marland Kitchen aspirin 325 MG tablet Take 325 mg by mouth daily.      Marland Kitchen azelastine (ASTELIN) 0.1 % nasal spray USE 1-2 SPRAYS IN EACH NOSTRIL TWICE DAILY AS NEEDED 30 mL 5  . Blood Glucose Calibration (ADVOCATE REDI-CODE+ CONTROL) LOW SOLN   3  . Blood Glucose Monitoring Suppl (ADVOCATE REDI-CODE+) DEVI   1  . CLEVER CHEK AUTO-CODE VOICE test strip     . clotrimazole-betamethasone (LOTRISONE) cream Apply topically 2 (two) times daily as needed.     . Cyanocobalamin (VITAMIN B-12 IJ) Inject as directed. GET A B12 INJECTION ONCE EVERY MONTH    . cyanocobalamin 1000 MCG tablet Take 500 mcg by mouth 2 (two) times daily.     . famotidine (PEPCID) 20 MG tablet One at bedtime 30 tablet 2  . gabapentin (NEURONTIN) 300 MG capsule Take 1 tablet at bedtime for one week, then increase to 2 tablets at bedtime 60 capsule 11  . gabapentin (NEURONTIN) 300 MG capsule 1 or 2 pills by mouth at night as needed for leg pain    . Inositol Niacinate (NIACIN FLUSH FREE) 500 MG CAPS Take by mouth.    Elmore Guise Devices (SIMPLE DIAGNOSTICS LANCING DEV) MISC   1  . metFORMIN (GLUCOPHAGE) 500 MG tablet Take 500 mg by mouth.    . metFORMIN (GLUCOPHAGE-XR) 500 MG 24 hr tablet Take 2 tablets (1,000 mg total) by mouth daily. 180 tablet 3  . montelukast (SINGULAIR) 10 MG tablet  Take 1 tablet (10 mg total) by mouth at bedtime. 90 tablet 3  . Multiple Vitamins-Minerals (MULTIVITAMIN WITH MINERALS) tablet Take 1 tablet by mouth daily.      . nitroGLYCERIN (NITROSTAT) 0.4 MG SL tablet Place 0.4 mg under the tongue every 5 (five) minutes as needed.    . pantoprazole (PROTONIX) 40 MG tablet Take 1 tablet (40 mg total) by mouth daily. Take 30-60 min before first meal of the day 30 tablet 2  . sertraline (ZOLOFT) 50 MG tablet TAKE 1 TABLET (50 MG TOTAL) BY MOUTH DAILY. 90 tablet 3  .  simvastatin (ZOCOR) 80 MG tablet Take 1 tablet (80 mg total) by mouth at bedtime. 90 tablet 3  . traMADol (ULTRAM) 50 MG tablet Take 1 tablet (50 mg total) by mouth every 12 (twelve) hours as needed. 180 tablet 1  . Zinc 50 MG CAPS Take 1 capsule by mouth.       No current facility-administered medications on file prior to visit.    No Known Allergies  Recent Results (from the past 2160 hour(s))  POCT glycosylated hemoglobin (Hb A1C)     Status: None   Collection Time: 08/29/15  2:15 PM  Result Value Ref Range   Hemoglobin A1C 6.0   CBC with Differential     Status: Abnormal   Collection Time: 10/05/15 11:30 AM  Result Value Ref Range   WBC 6.4 3.4 - 10.8 x10E3/uL   RBC 3.72 (L) 4.14 - 5.80 x10E6/uL   Hemoglobin 11.2 (L) 12.6 - 17.7 g/dL   Hematocrit 34.0 (L) 37.5 - 51.0 %   MCV 91 79 - 97 fL   MCH 30.1 26.6 - 33.0 pg   MCHC 32.9 31.5 - 35.7 g/dL   RDW 13.1 12.3 - 15.4 %   Platelets 178 150 - 379 x10E3/uL   Neutrophils 72 Not Estab. %   Lymphs 14 Not Estab. %   Monocytes 13 Not Estab. %   Eos 1 Not Estab. %   Basos 0 Not Estab. %   Neutrophils Absolute 4.6 1.4 - 7.0 x10E3/uL   Lymphocytes Absolute 0.9 0.7 - 3.1 x10E3/uL   Monocytes Absolute 0.8 0.1 - 0.9 x10E3/uL   EOS (ABSOLUTE) 0.1 0.0 - 0.4 x10E3/uL   Basophils Absolute 0.0 0.0 - 0.2 x10E3/uL   Immature Granulocytes 0 Not Estab. %   Immature Grans (Abs) 0.0 0.0 - 0.1 O27O3/JK  Basic Metabolic Panel     Status: Abnormal   Collection Time: 10/05/15 11:30 AM  Result Value Ref Range   Glucose 109 (H) 65 - 99 mg/dL   BUN 19 8 - 27 mg/dL   Creatinine, Ser 0.76 0.76 - 1.27 mg/dL   GFR calc non Af Amer 86 >59 mL/min/1.73   GFR calc Af Amer 99 >59 mL/min/1.73   BUN/Creatinine Ratio 25 (H) 10 - 24   Sodium 140 134 - 144 mmol/L   Potassium 5.0 3.5 - 5.2 mmol/L   Chloride 102 96 - 106 mmol/L   CO2 25 18 - 29 mmol/L   Calcium 9.3 8.6 - 10.2 mg/dL  Sedimentation Rate     Status: None   Collection Time: 10/05/15 11:30 AM   Result Value Ref Range   Sed Rate 21 0 - 30 mm/hr  C-reactive protein     Status: None   Collection Time: 10/05/15 11:30 AM  Result Value Ref Range   CRP 4.5 0.0 - 4.9 mg/L  Uric Acid     Status: None   Collection Time:  10/05/15 11:30 AM  Result Value Ref Range   Uric Acid 4.6 3.7 - 8.6 mg/dL    Comment:            Therapeutic target for gout patients: <6.0    Objective: There were no vitals filed for this visit.  General: Patient is awake, alert, oriented x 3 and in no acute distress.  Dermatology: Skin is warm and dry bilateral with a partial thickness ulceration present Left sub-met 1; with granular base 1x1cm with a central skin island in between making 2 ulcerated wound beds; There is keratosis periwound, no malodor,  decreased erythema, decreased edema focal to forefoot without warm. No other acute signs of infection.   Vascular: Dorsalis Pedis pulse = 1/4 Bilateral,  Posterior Tibial pulse = 1/4 Bilateral,  Capillary Fill Time < 5 seconds  Neurologic: Protective sensation absent bilateral using the 5.07/10g Semmes Weinstein Monofilament.  Musculosketal: There is decreased ankle joint range of motion Bilateral. There is decreased Subtalar joint range of motion Bilateral. There is a decrease in 1st metatarsophalangeal joint range of motion Bilateral. No Pain with palpation to ulcer area, left foot. No pain with compression to calves bilateral. Hammertoes with Muscle weakness requiring bracing bilateral from Charcot-Marie-Tooth.  Assessment and Plan:  Problem List Items Addressed This Visit      Nervous and Auditory   CHARCOT-MARIE-TOOTH DISEASE    Other Visit Diagnoses    Foot ulcer, left, limited to breakdown of skin ([place])    -  Primary   Relevant Medications   cadexomer iodine (IODOSORB) 0.9 % gel   Cellulitis and abscess of foot, except toes       Type 2 diabetes, controlled, with neuropathy (HCC)       Left foot pain         -Examined patient and discussed the  progression of the wound and treatment alternatives. -Ulceration debrided using sterile chisel blade; Applied offloading pad and iodosorb and advised daughter to do the same. -Continue with Augmentin until completed -Continue with CAM boot on left -Patient to return to office in 2 weeks for follow up ulceration care or sooner if problems arise. May consider PRISMA next visit if remains open.   Landis Martins, DPM

## 2015-10-27 ENCOUNTER — Ambulatory Visit: Payer: Medicare Other | Admitting: Sports Medicine

## 2015-10-27 ENCOUNTER — Ambulatory Visit (INDEPENDENT_AMBULATORY_CARE_PROVIDER_SITE_OTHER): Payer: Medicare Other | Admitting: Sports Medicine

## 2015-10-27 ENCOUNTER — Encounter: Payer: Self-pay | Admitting: Sports Medicine

## 2015-10-27 DIAGNOSIS — M79672 Pain in left foot: Secondary | ICD-10-CM

## 2015-10-27 DIAGNOSIS — E1140 Type 2 diabetes mellitus with diabetic neuropathy, unspecified: Secondary | ICD-10-CM

## 2015-10-27 DIAGNOSIS — G600 Hereditary motor and sensory neuropathy: Secondary | ICD-10-CM

## 2015-10-27 DIAGNOSIS — L89891 Pressure ulcer of other site, stage 1: Secondary | ICD-10-CM | POA: Diagnosis not present

## 2015-10-27 DIAGNOSIS — L97521 Non-pressure chronic ulcer of other part of left foot limited to breakdown of skin: Secondary | ICD-10-CM

## 2015-10-27 NOTE — Progress Notes (Signed)
Patient ID: [name], male   DOB: 1931/06/29, 82 y.o.   MRN: [id number] Subjective: [name] is a 82 y.o. male patient seen in office for follow up evaluation of ulceration of the left foot, sub-met 1; daughter has been applying iodosorb and offloading dressing to site.  Patient has a history of diabetes and a blood glucose level not recorded, a1c 6, unchanged from prior. Patient completed Augmentin with no issues. Denies nausea/fever/vomiting/chills/night sweats/shortness of breath/pain. Patient has no other pedal complaints at this time.  Patient Active Problem List   Diagnosis Date Noted  . Diabetes ([place]) 03/10/2015  . Perennial and seasonal allergic rhinitis 12/20/2014  . Hematuria 12/20/2014  . Loss of weight 05/06/2014  . Other pancytopenia ([place]) 05/05/2014  . CAP (community acquired pneumonia) 05/05/2014  . Hypocalcemia 05/05/2014  . Skin lesion 04/30/2014  . 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 Abdominal pain, right upper quadrant 05/04/2013  . Cold intolerance 05/04/2013  . Low back pain 04/03/2012  . Encounter for long-term (current) use of other medications 12/11/2011  . Routine general medical examination at a health care facility 12/17/2010  . Screening for prostate cancer 12/12/2010  . Osteoarthrosis, unspecified whether generalized or localized, unspecified site 10/24/2010  . SKIN RASH 06/29/2009  . Iron deficiency anemia 03/10/2009  . BACK PAIN, LUMBAR 03/10/2009  . [place] DISEASE 05/03/2008  . DYSPNEA 05/03/2008  . DEPRESSIVE DISORDER NOT ELSEWHERE CLASSIFIED 05/22/2007  . CHEST PAIN 05/22/2007  . ANEMIA, PERNICIOUS 12/02/2006  . HYPERTENSION 07/26/2006  . CORONARY ARTERY DISEASE 07/26/2006  . GERD 07/26/2006   Current Outpatient Prescriptions on File Prior to Visit  Medication Sig Dispense Refill  . amoxicillin-clavulanate (AUGMENTIN) 875-125 MG tablet Take 1 tablet by mouth 2 (two) times daily. 28 tablet 0  . Ascorbic Acid  (VITAMIN C) 500 MG tablet Take 500 mg by mouth 2 (two) times daily.     Marland Kitchen aspirin (GOODSENSE ASPIRIN) 325 MG tablet Take 325 mg by mouth.    Marland Kitchen aspirin 325 MG tablet Take 325 mg by mouth daily.      Marland Kitchen azelastine (ASTELIN) 0.1 % nasal spray USE 1-2 SPRAYS IN EACH NOSTRIL TWICE DAILY AS NEEDED 30 mL 5  . Blood Glucose Calibration (ADVOCATE REDI-CODE+ CONTROL) LOW SOLN   3  . Blood Glucose Monitoring Suppl (ADVOCATE REDI-CODE+) DEVI   1  . cadexomer iodine (IODOSORB) 0.9 % gel Apply 1 application topically daily as needed for wound care. 40 g 0  . CLEVER CHEK AUTO-CODE VOICE test strip     . clotrimazole-betamethasone (LOTRISONE) cream Apply topically 2 (two) times daily as needed.     . Cyanocobalamin (VITAMIN B-12 IJ) Inject as directed. GET A B12 INJECTION ONCE EVERY MONTH    . cyanocobalamin 1000 MCG tablet Take 500 mcg by mouth 2 (two) times daily.     . famotidine (PEPCID) 20 MG tablet One at bedtime 30 tablet 2  . gabapentin (NEURONTIN) 300 MG capsule Take 1 tablet at bedtime for one week, then increase to 2 tablets at bedtime 60 capsule 11  . gabapentin (NEURONTIN) 300 MG capsule 1 or 2 pills by mouth at night as needed for leg pain    . Inositol Niacinate (NIACIN FLUSH FREE) 500 MG CAPS Take by mouth.    Elmore Guise Devices (SIMPLE DIAGNOSTICS LANCING DEV) MISC   1  . metFORMIN (GLUCOPHAGE) 500 MG tablet Take 500 mg by mouth.    . metFORMIN (GLUCOPHAGE-XR) 500 MG 24 hr tablet Take 2 tablets (  1,000 mg total) by mouth daily. 180 tablet 3  . montelukast (SINGULAIR) 10 MG tablet Take 1 tablet (10 mg total) by mouth at bedtime. 90 tablet 3  . Multiple Vitamins-Minerals (MULTIVITAMIN WITH MINERALS) tablet Take 1 tablet by mouth daily.      . nitroGLYCERIN (NITROSTAT) 0.4 MG SL tablet Place 0.4 mg under the tongue every 5 (five) minutes as needed.    . pantoprazole (PROTONIX) 40 MG tablet Take 1 tablet (40 mg total) by mouth daily. Take 30-60 min before first meal of the day 30 tablet 2  .  sertraline (ZOLOFT) 50 MG tablet TAKE 1 TABLET (50 MG TOTAL) BY MOUTH DAILY. 90 tablet 3  . simvastatin (ZOCOR) 80 MG tablet Take 1 tablet (80 mg total) by mouth at bedtime. 90 tablet 3  . traMADol (ULTRAM) 50 MG tablet Take 1 tablet (50 mg total) by mouth every 12 (twelve) hours as needed. 180 tablet 1  . Zinc 50 MG CAPS Take 1 capsule by mouth.       No current facility-administered medications on file prior to visit.    No Known Allergies  Recent Results (from the past 2160 hour(s))  POCT glycosylated hemoglobin (Hb A1C)     Status: None   Collection Time: 08/29/15  2:15 PM  Result Value Ref Range   Hemoglobin A1C 6.0   CBC with Differential     Status: Abnormal   Collection Time: 10/05/15 11:30 AM  Result Value Ref Range   WBC 6.4 3.4 - 10.8 x10E3/uL   RBC 3.72 (L) 4.14 - 5.80 x10E6/uL   Hemoglobin 11.2 (L) 12.6 - 17.7 g/dL   Hematocrit 34.0 (L) 37.5 - 51.0 %   MCV 91 79 - 97 fL   MCH 30.1 26.6 - 33.0 pg   MCHC 32.9 31.5 - 35.7 g/dL   RDW 13.1 12.3 - 15.4 %   Platelets 178 150 - 379 x10E3/uL   Neutrophils 72 Not Estab. %   Lymphs 14 Not Estab. %   Monocytes 13 Not Estab. %   Eos 1 Not Estab. %   Basos 0 Not Estab. %   Neutrophils Absolute 4.6 1.4 - 7.0 x10E3/uL   Lymphocytes Absolute 0.9 0.7 - 3.1 x10E3/uL   Monocytes Absolute 0.8 0.1 - 0.9 x10E3/uL   EOS (ABSOLUTE) 0.1 0.0 - 0.4 x10E3/uL   Basophils Absolute 0.0 0.0 - 0.2 x10E3/uL   Immature Granulocytes 0 Not Estab. %   Immature Grans (Abs) 0.0 0.0 - 0.1 O16W7/PX  Basic Metabolic Panel     Status: Abnormal   Collection Time: 10/05/15 11:30 AM  Result Value Ref Range   Glucose 109 (H) 65 - 99 mg/dL   BUN 19 8 - 27 mg/dL   Creatinine, Ser 0.76 0.76 - 1.27 mg/dL   GFR calc non Af Amer 86 >59 mL/min/1.73   GFR calc Af Amer 99 >59 mL/min/1.73   BUN/Creatinine Ratio 25 (H) 10 - 24   Sodium 140 134 - 144 mmol/L   Potassium 5.0 3.5 - 5.2 mmol/L   Chloride 102 96 - 106 mmol/L   CO2 25 18 - 29 mmol/L   Calcium 9.3 8.6 -  10.2 mg/dL  Sedimentation Rate     Status: None   Collection Time: 10/05/15 11:30 AM  Result Value Ref Range   Sed Rate 21 0 - 30 mm/hr  C-reactive protein     Status: None   Collection Time: 10/05/15 11:30 AM  Result Value Ref Range   CRP 4.5 0.0 -  4.9 mg/L  Uric Acid     Status: None   Collection Time: 10/05/15 11:30 AM  Result Value Ref Range   Uric Acid 4.6 3.7 - 8.6 mg/dL    Comment:            Therapeutic target for gout patients: <6.0    Objective: There were no vitals filed for this visit.  General: Patient is awake, alert, oriented x 3 and in no acute distress.  Dermatology: Skin is warm and dry bilateral with a partial thickness ulceration present Left sub-met 1; granular base 1x0.3x0.1cm with a  resolved central skin island making it only 1 wound present now.  There is keratosis periwound, no malodor,  decreased erythema, decreased edema focal to forefoot without warm. No other acute signs of infection.   Vascular: Dorsalis Pedis pulse = 1/4 Bilateral,  Posterior Tibial pulse = 1/4 Bilateral,  Capillary Fill Time < 5 seconds  Neurologic: Protective sensation absent bilateral using the 5.07/10g Semmes Weinstein Monofilament.  Musculosketal: There is decreased ankle joint range of motion Bilateral. There is decreased Subtalar joint range of motion Bilateral. There is a decrease in 1st metatarsophalangeal joint range of motion Bilateral. No Pain with palpation to ulcer area, left foot. No pain with compression to calves bilateral. Hammertoes with Muscle weakness requiring bracing bilateral from Charcot-Marie-Tooth.  Assessment and Plan:  Problem List Items Addressed This Visit      Nervous and Auditory   CHARCOT-MARIE-TOOTH DISEASE    Other Visit Diagnoses    Foot ulcer, left, limited to breakdown of skin ([place])    -  Primary   Type 2 diabetes, controlled, with neuropathy ([place])       Left foot pain         -Examined patient and discussed the progression of the wound  and treatment alternatives. -Ulceration debrided using sterile chisel blade; Applied offloading pad and Prisma Ag and advised daughter to do the same. [place] Starter kit given and wound supplies ordered. -Continue with CAM boot on left -Patient to return to office in 2 weeks for follow up ulceration care or sooner if problems arise.    Landis Martins, DPM

## 2015-10-30 DIAGNOSIS — J9610 Chronic respiratory failure, unspecified whether with hypoxia or hypercapnia: Secondary | ICD-10-CM | POA: Diagnosis not present

## 2015-10-31 DIAGNOSIS — L97521 Non-pressure chronic ulcer of other part of left foot limited to breakdown of skin: Secondary | ICD-10-CM | POA: Diagnosis not present

## 2015-11-10 ENCOUNTER — Encounter: Payer: Self-pay | Admitting: Sports Medicine

## 2015-11-10 ENCOUNTER — Ambulatory Visit (INDEPENDENT_AMBULATORY_CARE_PROVIDER_SITE_OTHER): Payer: Medicare Other | Admitting: Sports Medicine

## 2015-11-10 DIAGNOSIS — M79672 Pain in left foot: Secondary | ICD-10-CM

## 2015-11-10 DIAGNOSIS — L97521 Non-pressure chronic ulcer of other part of left foot limited to breakdown of skin: Secondary | ICD-10-CM

## 2015-11-10 DIAGNOSIS — G600 Hereditary motor and sensory neuropathy: Secondary | ICD-10-CM

## 2015-11-10 DIAGNOSIS — E1140 Type 2 diabetes mellitus with diabetic neuropathy, unspecified: Secondary | ICD-10-CM | POA: Diagnosis not present

## 2015-11-10 NOTE — Progress Notes (Signed)
Patient ID: [name], male   DOB: Jun 02, 1931, 82 y.o.   MRN: [id number] Subjective: [name] is a 82 y.o. male patient seen in office for follow up evaluation of ulceration of the left foot, sub-met 1; daughter has been applying Prisma and offloading dressing to site.  Patient has a history of diabetes and a blood glucose level not recorded, a1c 6, unchanged from prior. Denies nausea/fever/vomiting/chills/night sweats/shortness of breath/pain. Patient has no other pedal complaints at this time.  Patient Active Problem List   Diagnosis Date Noted  . Diabetes ([place]) 03/10/2015  . Perennial and seasonal allergic rhinitis 12/20/2014  . Hematuria 12/20/2014  . Loss of weight 05/06/2014  . Other pancytopenia ([place]) 05/05/2014  . CAP (community acquired pneumonia) 05/05/2014  . Hypocalcemia 05/05/2014  . Skin lesion 04/30/2014  . 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 Abdominal pain, right upper quadrant 05/04/2013  . Cold intolerance 05/04/2013  . Low back pain 04/03/2012  . Encounter for long-term (current) use of other medications 12/11/2011  . Routine general medical examination at a health care facility 12/17/2010  . Screening for prostate cancer 12/12/2010  . Osteoarthrosis, unspecified whether generalized or localized, unspecified site 10/24/2010  . SKIN RASH 06/29/2009  . Iron deficiency anemia 03/10/2009  . BACK PAIN, LUMBAR 03/10/2009  . [place] DISEASE 05/03/2008  . DYSPNEA 05/03/2008  . DEPRESSIVE DISORDER NOT ELSEWHERE CLASSIFIED 05/22/2007  . CHEST PAIN 05/22/2007  . ANEMIA, PERNICIOUS 12/02/2006  . HYPERTENSION 07/26/2006  . CORONARY ARTERY DISEASE 07/26/2006  . GERD 07/26/2006   Current Outpatient Prescriptions on File Prior to Visit  Medication Sig Dispense Refill  . amoxicillin-clavulanate (AUGMENTIN) 875-125 MG tablet Take 1 tablet by mouth 2 (two) times daily. 28 tablet 0  . Ascorbic Acid (VITAMIN C) 500 MG tablet Take 500 mg by mouth  2 (two) times daily.     Marland Kitchen aspirin (GOODSENSE ASPIRIN) 325 MG tablet Take 325 mg by mouth.    Marland Kitchen aspirin 325 MG tablet Take 325 mg by mouth daily.      Marland Kitchen azelastine (ASTELIN) 0.1 % nasal spray USE 1-2 SPRAYS IN EACH NOSTRIL TWICE DAILY AS NEEDED 30 mL 5  . Blood Glucose Calibration (ADVOCATE REDI-CODE+ CONTROL) LOW SOLN   3  . Blood Glucose Monitoring Suppl (ADVOCATE REDI-CODE+) DEVI   1  . cadexomer iodine (IODOSORB) 0.9 % gel Apply 1 application topically daily as needed for wound care. 40 g 0  . CLEVER CHEK AUTO-CODE VOICE test strip     . clotrimazole-betamethasone (LOTRISONE) cream Apply topically 2 (two) times daily as needed.     . Cyanocobalamin (VITAMIN B-12 IJ) Inject as directed. GET A B12 INJECTION ONCE EVERY MONTH    . cyanocobalamin 1000 MCG tablet Take 500 mcg by mouth 2 (two) times daily.     . famotidine (PEPCID) 20 MG tablet One at bedtime 30 tablet 2  . gabapentin (NEURONTIN) 300 MG capsule Take 1 tablet at bedtime for one week, then increase to 2 tablets at bedtime 60 capsule 11  . gabapentin (NEURONTIN) 300 MG capsule 1 or 2 pills by mouth at night as needed for leg pain    . Inositol Niacinate (NIACIN FLUSH FREE) 500 MG CAPS Take by mouth.    Elmore Guise Devices (SIMPLE DIAGNOSTICS LANCING DEV) MISC   1  . metFORMIN (GLUCOPHAGE) 500 MG tablet Take 500 mg by mouth.    . metFORMIN (GLUCOPHAGE-XR) 500 MG 24 hr tablet Take 2 tablets (1,000 mg total) by mouth daily.  180 tablet 3  . montelukast (SINGULAIR) 10 MG tablet Take 1 tablet (10 mg total) by mouth at bedtime. 90 tablet 3  . Multiple Vitamins-Minerals (MULTIVITAMIN WITH MINERALS) tablet Take 1 tablet by mouth daily.      . nitroGLYCERIN (NITROSTAT) 0.4 MG SL tablet Place 0.4 mg under the tongue every 5 (five) minutes as needed.    . pantoprazole (PROTONIX) 40 MG tablet Take 1 tablet (40 mg total) by mouth daily. Take 30-60 min before first meal of the day 30 tablet 2  . sertraline (ZOLOFT) 50 MG tablet TAKE 1 TABLET (50 MG  TOTAL) BY MOUTH DAILY. 90 tablet 3  . simvastatin (ZOCOR) 80 MG tablet Take 1 tablet (80 mg total) by mouth at bedtime. 90 tablet 3  . traMADol (ULTRAM) 50 MG tablet Take 1 tablet (50 mg total) by mouth every 12 (twelve) hours as needed. 180 tablet 1  . Zinc 50 MG CAPS Take 1 capsule by mouth.       No current facility-administered medications on file prior to visit.    No Known Allergies  Recent Results (from the past 2160 hour(s))  POCT glycosylated hemoglobin (Hb A1C)     Status: None   Collection Time: 08/29/15  2:15 PM  Result Value Ref Range   Hemoglobin A1C 6.0   CBC with Differential     Status: Abnormal   Collection Time: 10/05/15 11:30 AM  Result Value Ref Range   WBC 6.4 3.4 - 10.8 x10E3/uL   RBC 3.72 (L) 4.14 - 5.80 x10E6/uL   Hemoglobin 11.2 (L) 12.6 - 17.7 g/dL   Hematocrit 34.0 (L) 37.5 - 51.0 %   MCV 91 79 - 97 fL   MCH 30.1 26.6 - 33.0 pg   MCHC 32.9 31.5 - 35.7 g/dL   RDW 13.1 12.3 - 15.4 %   Platelets 178 150 - 379 x10E3/uL   Neutrophils 72 Not Estab. %   Lymphs 14 Not Estab. %   Monocytes 13 Not Estab. %   Eos 1 Not Estab. %   Basos 0 Not Estab. %   Neutrophils Absolute 4.6 1.4 - 7.0 x10E3/uL   Lymphocytes Absolute 0.9 0.7 - 3.1 x10E3/uL   Monocytes Absolute 0.8 0.1 - 0.9 x10E3/uL   EOS (ABSOLUTE) 0.1 0.0 - 0.4 x10E3/uL   Basophils Absolute 0.0 0.0 - 0.2 x10E3/uL   Immature Granulocytes 0 Not Estab. %   Immature Grans (Abs) 0.0 0.0 - 0.1 [id number]/WU  Basic Metabolic Panel     Status: Abnormal   Collection Time: 10/05/15 11:30 AM  Result Value Ref Range   Glucose 109 (H) 65 - 99 mg/dL   BUN 19 8 - 27 mg/dL   Creatinine, Ser 0.76 0.76 - 1.27 mg/dL   GFR calc non Af Amer 86 >59 mL/min/1.73   GFR calc Af Amer 99 >59 mL/min/1.73   BUN/Creatinine Ratio 25 (H) 10 - 24   Sodium 140 134 - 144 mmol/L   Potassium 5.0 3.5 - 5.2 mmol/L   Chloride 102 96 - 106 mmol/L   CO2 25 18 - 29 mmol/L   Calcium 9.3 8.6 - 10.2 mg/dL  Sedimentation Rate     Status: None    Collection Time: 10/05/15 11:30 AM  Result Value Ref Range   Sed Rate 21 0 - 30 mm/hr  C-reactive protein     Status: None   Collection Time: 10/05/15 11:30 AM  Result Value Ref Range   CRP 4.5 0.0 - 4.9 mg/L  Uric Acid  Status: None   Collection Time: 10/05/15 11:30 AM  Result Value Ref Range   Uric Acid 4.6 3.7 - 8.6 mg/dL    Comment:            Therapeutic target for gout patients: <6.0    Objective: There were no vitals filed for this visit.  General: Patient is awake, alert, oriented x 3 and in no acute distress.  Dermatology: Skin is warm and dry bilateral with a now healed ulceration Left sub-met 1;no opening with keratosis periwound, no malodor,  no erythema, mild edema focal to forefoot without warm. There is mild keratosis at left medial heel with no signs of infection. No other acute signs of infection.   Vascular: Dorsalis Pedis pulse = 1/4 Bilateral,  Posterior Tibial pulse = 1/4 Bilateral,  Capillary Fill Time < 5 seconds  Neurologic: Protective sensation absent bilateral using the 5.07/10g Semmes Weinstein Monofilament.  Musculosketal: There is decreased ankle joint range of motion Bilateral. There is decreased Subtalar joint range of motion Bilateral. There is a decrease in 1st metatarsophalangeal joint range of motion Bilateral. No Pain with palpation to previously ulcerated area, left foot. No pain with compression to calves bilateral. Hammertoes with Muscle weakness requiring bracing bilateral from Charcot-Marie-Tooth.  Assessment and Plan:  Problem List Items Addressed This Visit      Nervous and Auditory   CHARCOT-MARIE-TOOTH DISEASE    Other Visit Diagnoses    Foot ulcer, left, limited to breakdown of skin ([place])    -  Primary   Type 2 diabetes, controlled, with neuropathy ([place])       Left foot pain         -Examined patient and discussed the progression of the healed wound and plan of care. -Debrided prematurely healed ulceration with no underlying  opening left sub met 1 and medial heel keratosis using sterile chisel blade; Applied offloading pad and Prisma Ag to left forefoot and advised daughter to do the same.  -Continue with CAM boot on left x 1 week thereafter may return to custom shoe with brace and offloading pad to foot -Continue with skin emollients to keratotic heel  -Patient to return to office in 3 weeks for follow up care or sooner if problems arise.    Landis Martins, DPM

## 2015-11-14 ENCOUNTER — Other Ambulatory Visit: Payer: Self-pay | Admitting: Endocrinology

## 2015-11-29 ENCOUNTER — Other Ambulatory Visit: Payer: Self-pay | Admitting: Neurology

## 2015-11-29 NOTE — Telephone Encounter (Signed)
Rx sent 

## 2015-11-30 DIAGNOSIS — J9610 Chronic respiratory failure, unspecified whether with hypoxia or hypercapnia: Secondary | ICD-10-CM | POA: Diagnosis not present

## 2015-12-01 ENCOUNTER — Ambulatory Visit: Payer: Medicare Other | Admitting: Sports Medicine

## 2015-12-04 NOTE — Progress Notes (Signed)
Subjective:    Patient ID: [name], male    DOB: [birth year], 82 y.o.   MRN: [id number]  HPI Pt returns for f/u of diabetes mellitus: DM type: 2 Dx'ed: 2008 Complications: CAD.  Therapy: metformin.  DKA: never.  Severe hypoglycemia: never.  Pancreatitis: never.  Other: he has never been on insulin.   Interval history: no cbg record, but states cbg's are well-controlled.  pt states he feels well in general.   Chronic leg pain: gabapentin helps. Chronic arthralgias: pain is worst at the shoulders.  tramadol helps. Past Medical History:  Diagnosis Date  . ANEMIA, IRON DEFICIENCY   . BACK PAIN, LUMBAR   . Charcot-Marie-Tooth disease    assoiciated muscular dystrophy   . COPD (chronic obstructive pulmonary disease) ([facility])   . Coronary artery disease   . DIABETES MELLITUS, TYPE II   . GERD (gastroesophageal reflux disease)   . H/O: asbestos exposure   . Hypertension   . Leukopenia   . Pernicious anemia   . Smoker    quit 1968--25 ppy    Past Surgical History:  Procedure Laterality Date  . CORONARY ARTERY BYPASS GRAFT  1995    Social History   Social History  . Marital status: Widowed    Spouse name: N/A  . Number of children: N/A  . Years of education: N/A   Occupational History  . Retired Retired    Games developerdiesel mechanic   Social History Main Topics  . Smoking status: Former Smoker    Packs/day: 1.00    Years: 20.00    Types: Cigarettes    Quit date: 01/01/1966  . Smokeless tobacco: Never Used  . Alcohol use No  . Drug use: No  . Sexual activity: Not on file   Other Topics Concern  . Not on file   Social History Narrative   Was in service during [place].  Lives alone in a one story home.  On disability.            Current Outpatient Prescriptions on File Prior to Visit  Medication Sig Dispense Refill  . Ascorbic Acid (VITAMIN C) 500 MG tablet Take 500 mg by mouth 2 (two) times daily.     Marland Kitchen. aspirin (GOODSENSE ASPIRIN) 325 MG tablet Take 325 mg by  mouth.    Marland Kitchen. aspirin 325 MG tablet Take 325 mg by mouth daily.      Marland Kitchen. azelastine (ASTELIN) 0.1 % nasal spray USE 1-2 SPRAYS IN EACH NOSTRIL TWICE DAILY AS NEEDED 30 mL 5  . Blood Glucose Calibration (ADVOCATE REDI-CODE+ CONTROL) LOW SOLN   3  . Blood Glucose Monitoring Suppl (ADVOCATE REDI-CODE+) DEVI   1  . cadexomer iodine (IODOSORB) 0.9 % gel Apply 1 application topically daily as needed for wound care. 40 g 0  . CLEVER CHEK AUTO-CODE VOICE test strip     . clotrimazole-betamethasone (LOTRISONE) cream Apply topically 2 (two) times daily as needed.     . Cyanocobalamin (VITAMIN B-12 IJ) Inject as directed. GET A B12 INJECTION ONCE EVERY MONTH    . cyanocobalamin 1000 MCG tablet Take 500 mcg by mouth 2 (two) times daily.     . famotidine (PEPCID) 20 MG tablet One at bedtime 30 tablet 2  . Inositol Niacinate (NIACIN FLUSH FREE) 500 MG CAPS Take by mouth.    Demetra Shiner. Lancet Devices (SIMPLE DIAGNOSTICS LANCING DEV) MISC   1  . montelukast (SINGULAIR) 10 MG tablet TAKE 1 TABLET BY MOUTH AT  BEDTIME 90 tablet  3  . Multiple Vitamins-Minerals (MULTIVITAMIN WITH MINERALS) tablet Take 1 tablet by mouth daily.      . pantoprazole (PROTONIX) 40 MG tablet Take 1 tablet (40 mg total) by mouth daily. Take 30-60 min before first meal of the day 30 tablet 2  . traMADol (ULTRAM) 50 MG tablet Take 1 tablet (50 mg total) by mouth every 12 (twelve) hours as needed. 180 tablet 1  . Zinc 50 MG CAPS Take 1 capsule by mouth.      . nitroGLYCERIN (NITROSTAT) 0.4 MG SL tablet Place 0.4 mg under the tongue every 5 (five) minutes as needed.     No current facility-administered medications on file prior to visit.     No Known Allergies  Family History  Problem Relation Age of Onset  . Charcot-Marie-Tooth disease Sister   . Charcot-Marie-Tooth disease Brother   . Charcot-Marie-Tooth disease Brother   . Charcot-Marie-Tooth disease Brother   . Charcot-Marie-Tooth disease Sister   . Charcot-Marie-Tooth disease Son   .  Charcot-Marie-Tooth disease Other   . COPD Daughter     never smoker    BP (!) 144/76   Pulse 77   Wt 194 lb 6.4 oz (88.2 kg)   SpO2 97%   BMI 27.89 kg/m    Review of Systems Denies low back pain.  Denies chest pain.      Objective:   Physical Exam VITAL SIGNS:  See vs page GENERAL: no distress Foot exam is declined today.     A1c=6.0%  i personally reviewed electrocardiogram tracing (today):  Indication: DM Impression: normal    Assessment & Plan:  Type 2 DM: well-controlled.  Please continue the same medication Arthralgias: pain is well-controlled. Depression: sertraline dose is limited by tramadol interaction. Patient is advised the following: Patient Instructions  Please continue the same meds.  Please come back for a regular physical appointment in 3 months.

## 2015-12-05 ENCOUNTER — Ambulatory Visit (INDEPENDENT_AMBULATORY_CARE_PROVIDER_SITE_OTHER): Payer: Medicare Other | Admitting: Endocrinology

## 2015-12-05 ENCOUNTER — Encounter: Payer: Self-pay | Admitting: Endocrinology

## 2015-12-05 VITALS — BP 144/76 | HR 77 | Wt 194.4 lb

## 2015-12-05 DIAGNOSIS — Z9289 Personal history of other medical treatment: Secondary | ICD-10-CM | POA: Diagnosis not present

## 2015-12-05 DIAGNOSIS — E1159 Type 2 diabetes mellitus with other circulatory complications: Secondary | ICD-10-CM | POA: Diagnosis not present

## 2015-12-05 LAB — POCT GLYCOSYLATED HEMOGLOBIN (HGB A1C): HEMOGLOBIN A1C: 6

## 2015-12-05 MED ORDER — SERTRALINE HCL 50 MG PO TABS: 50.0000 mg | ORAL_TABLET | Freq: Every day | ORAL | 3 refills | Status: DC

## 2015-12-05 MED ORDER — METFORMIN HCL ER 500 MG PO TB24: 1000.0000 mg | ORAL_TABLET | Freq: Every day | ORAL | 3 refills | Status: DC

## 2015-12-05 MED ORDER — SIMVASTATIN 80 MG PO TABS: 80.0000 mg | ORAL_TABLET | Freq: Every day | ORAL | 3 refills | Status: DC

## 2015-12-05 MED ORDER — GABAPENTIN 300 MG PO CAPS: 600.0000 mg | ORAL_CAPSULE | Freq: Every day | ORAL | 3 refills | Status: DC

## 2015-12-05 NOTE — Patient Instructions (Addendum)
Please continue the same meds.  Please come back for a regular physical appointment in 3 months.

## 2015-12-06 NOTE — Progress Notes (Signed)
Chief Complaint  Patient presents with  . Shortness of Breath    History of Present Illness: 82 yo [name] with history of CAD with 5V CABG in 1995 with most recent relook cath 2006, DM, HTN, former smoker who is here today for cardiac follow up. I met him in April 2012 and he had c/o chest pains. Stress myoview April 2012 with no ischemia. I saw him 12/24/12 and there was question of atrial fibrillation in primary care. I arranged a 48 hour monitor which showed sinus rhythm with frequent PACs, several runs of SVT. Echo 01/12/13 with normal LV size and function, mild AI and TR. Probable mild pulm HTN. He could not lay flat for stress myoview images due to diaphragm issues, chronic problem. Seen by Dr. Melvyn Novas and note states he has no lung issues. He was treated for GERD.   He is here today for follow up. He tells me that he has been feeling well. He has stable angina with one episode of sharp chest pain last month, lasted for 2 seconds. He reports constant SOB secondary to his Charcot Marie Tooth syndrome. No changes since last visit. No awareness of palpitations.   Primary Care Physician: Renato Shin, MD  Past Medical History:  Diagnosis Date  . ANEMIA, IRON DEFICIENCY   . BACK PAIN, LUMBAR   . Charcot-Marie-Tooth disease    assoiciated muscular dystrophy   . COPD (chronic obstructive pulmonary disease) ([place])   . Coronary artery disease   . DIABETES MELLITUS, TYPE II   . GERD (gastroesophageal reflux disease)   . H/O: asbestos exposure   . Hypertension   . Leukopenia   . Pernicious anemia   . Smoker    quit 1968--25 ppy    Past Surgical History:  Procedure Laterality Date  . CORONARY ARTERY BYPASS GRAFT  1995    Current Outpatient Prescriptions  Medication Sig Dispense Refill  . Ascorbic Acid (VITAMIN C) 500 MG tablet Take 500 mg by mouth 2 (two) times daily.     Marland Kitchen aspirin (GOODSENSE ASPIRIN) 325 MG tablet Take 325 mg by mouth.    Marland Kitchen azelastine (ASTELIN) 0.1 % nasal spray USE  1-2 SPRAYS IN EACH NOSTRIL TWICE DAILY AS NEEDED 30 mL 5  . Blood Glucose Calibration (ADVOCATE REDI-CODE+ CONTROL) LOW SOLN   3  . Blood Glucose Monitoring Suppl (ADVOCATE REDI-CODE+) DEVI   1  . cadexomer iodine (IODOSORB) 0.9 % gel Apply 1 application topically daily as needed for wound care. 40 g 0  . CLEVER CHEK AUTO-CODE VOICE test strip     . clotrimazole-betamethasone (LOTRISONE) cream Apply topically 2 (two) times daily as needed.     . Cyanocobalamin (VITAMIN B-12 IJ) Inject as directed. GET A B12 INJECTION ONCE EVERY MONTH    . cyanocobalamin 1000 MCG tablet Take 500 mcg by mouth 2 (two) times daily.     . famotidine (PEPCID) 20 MG tablet One at bedtime 30 tablet 2  . gabapentin (NEURONTIN) 300 MG capsule Take 2 capsules (600 mg total) by mouth at bedtime. 180 capsule 3  . Inositol Niacinate (NIACIN FLUSH FREE) 500 MG CAPS Take by mouth.    Elmore Guise Devices (SIMPLE DIAGNOSTICS LANCING DEV) MISC   1  . metFORMIN (GLUCOPHAGE-XR) 500 MG 24 hr tablet Take 2 tablets (1,000 mg total) by mouth daily. 180 tablet 3  . montelukast (SINGULAIR) 10 MG tablet TAKE 1 TABLET BY MOUTH AT  BEDTIME 90 tablet 3  . Multiple Vitamins-Minerals (MULTIVITAMIN WITH MINERALS) tablet  Take 1 tablet by mouth daily.      . pantoprazole (PROTONIX) 40 MG tablet Take 1 tablet (40 mg total) by mouth daily. Take 30-60 min before first meal of the day 30 tablet 2  . sertraline (ZOLOFT) 50 MG tablet Take 1 tablet (50 mg total) by mouth daily. 90 tablet 3  . simvastatin (ZOCOR) 80 MG tablet Take 1 tablet (80 mg total) by mouth at bedtime. 90 tablet 3  . traMADol (ULTRAM) 50 MG tablet Take 1 tablet (50 mg total) by mouth every 12 (twelve) hours as needed. 180 tablet 1  . Zinc 50 MG CAPS Take 1 capsule by mouth.      . nitroGLYCERIN (NITROSTAT) 0.4 MG SL tablet Place 0.4 mg under the tongue every 5 (five) minutes as needed.     No current facility-administered medications for this visit.     No Known Allergies  Social  History   Social History  . Marital status: Widowed    Spouse name: N/A  . Number of children: N/A  . Years of education: N/A   Occupational History  . Retired Retired    Engineer, building services   Social History Main Topics  . Smoking status: Former Smoker    Packs/day: 1.00    Years: 20.00    Types: Cigarettes    Quit date: 01/01/1966  . Smokeless tobacco: Never Used  . Alcohol use No  . Drug use: No  . Sexual activity: Not on file   Other Topics Concern  . Not on file   Social History Narrative   Was in service during [place] War.  Lives alone in a one story home.  On disability.            Family History  Problem Relation Age of Onset  . Charcot-Marie-Tooth disease Sister   . Charcot-Marie-Tooth disease Brother   . Charcot-Marie-Tooth disease Brother   . Charcot-Marie-Tooth disease Brother   . Charcot-Marie-Tooth disease Sister   . Charcot-Marie-Tooth disease Son   . Charcot-Marie-Tooth disease Other   . COPD Daughter     never smoker    Review of Systems:  As stated in the HPI and otherwise negative.   BP 126/64   Pulse 73   Ht '5\' 10"'$  (1.778 m)   Wt 194 lb (88 kg)   SpO2 97%   BMI 27.84 kg/m   Physical Examination: General: Well developed, well nourished, NAD  HEENT: OP clear, mucus membranes moist  SKIN: warm, dry. No rashes. Neuro: No focal deficits  Musculoskeletal: Muscle strength 5/5 all ext  Psychiatric: Mood and affect normal  Neck: No JVD, no carotid bruits, no thyromegaly, no lymphadenopathy.  Lungs:Clear bilaterally, no wheezes, rhonci, crackles Cardiovascular: Regular rate and rhythm. No murmurs, gallops or rubs. Abdomen:Soft. Bowel sounds present. Non-tender.  Extremities: No lower extremity edema. Pulses are 2 + in the bilateral DP/PT.  Echo 01/12/13: Left ventricle: The cavity size was normal. There was mild focal basal hypertrophy of the septum. Systolic function was normal. The estimated ejection fraction was in the range of 55% to 60%.  Wall motion was normal; there were no regional wall motion abnormalities. - Aortic valve: Mild regurgitation. - Left atrium: The atrium was mildly dilated. - Right ventricle: The cavity size was mildly dilated. Wall thickness was normal. - Tricuspid valve: Mild-moderate regurgitation. - Pulmonary arteries: PA peak pressure: 68m Hg (S). Impressions:  - The right ventricular systolic pressure was increased consistent with mild pulmonary hypertension.  EKG:  EKG is not  ordered today. The ekg ordered today demonstrates   Recent Labs: 12/20/2014: ALT 20; Hemoglobin 11.1; TSH 2.23 10/05/2015: BUN 19; Creatinine, Ser 0.76; Platelets 178; Potassium 5.0; Sodium 140   Lipid Panel    Component Value Date/Time   CHOL 113 12/20/2014 1132   TRIG 83.0 12/20/2014 1132   HDL 44.40 12/20/2014 1132   CHOLHDL 3 12/20/2014 1132   VLDL 16.6 12/20/2014 1132   LDLCALC 52 12/20/2014 1132     Wt Readings from Last 3 Encounters:  12/07/15 194 lb (88 kg)  12/05/15 194 lb 6.4 oz (88.2 kg)  09/15/15 180 lb (81.6 kg)     Other studies Reviewed: Additional studies/ records that were reviewed today include: . Review of the above records demonstrates:    Assessment and Plan:   1. CAD: he is s/p CABG in 1995. He has stable angina with rare chest pains. Continue ASA and statin. No beta blocker with bradycardia.   2. SVT: No recent palpitations. Event monitor had shown bradycardia and several runs SVT.  He is not on rate control agents. If he has symptoms from SVT, will need EP referral as a pacemaker would likely be needed for rate backup if medical therapy is pursued to rate control.   3. Tobacco abuse, in remission: He has not started back smoking.   4. HTN: BP controlled. No changes.   Current medicines are reviewed at length with the patient today.  The patient does not have concerns regarding medicines.  The following changes have been made:  no change  Labs/ tests ordered today include:    No orders of the defined types were placed in this encounter.   Disposition:   FU with me in 12  months  Signed, Lauree Chandler, MD 12/07/2015 8:58 AM    [facility] [address], [place], [place]  [address] Phone: [phone]; Fax: [phone]

## 2015-12-07 ENCOUNTER — Ambulatory Visit (INDEPENDENT_AMBULATORY_CARE_PROVIDER_SITE_OTHER): Payer: Medicare Other | Admitting: Cardiovascular Disease

## 2015-12-07 ENCOUNTER — Encounter: Payer: Self-pay | Admitting: Cardiovascular Disease

## 2015-12-07 VITALS — BP 126/64 | HR 73 | Ht 70.0 in | Wt 194.0 lb

## 2015-12-07 DIAGNOSIS — I10 Essential (primary) hypertension: Secondary | ICD-10-CM

## 2015-12-07 DIAGNOSIS — F17201 Nicotine dependence, unspecified, in remission: Secondary | ICD-10-CM | POA: Diagnosis not present

## 2015-12-07 DIAGNOSIS — I471 Supraventricular tachycardia: Secondary | ICD-10-CM

## 2015-12-07 DIAGNOSIS — I25119 Atherosclerotic heart disease of native coronary artery with unspecified angina pectoris: Secondary | ICD-10-CM

## 2015-12-07 NOTE — Patient Instructions (Signed)
Medication Instructions:  Your physician recommends that you continue on your current medications as directed. Please refer to the Current Medication list given to you today.   Labwork: none  Testing/Procedures: none  Follow-Up: Your physician recommends that you schedule a follow-up appointment in: 12 months. Please call us in early September to schedule this appointment.     Any Other Special Instructions Will Be Listed Below (If Applicable).     If you need a refill on your cardiac medications before your next appointment, please call your pharmacy.

## 2015-12-08 ENCOUNTER — Encounter: Payer: Self-pay | Admitting: Sports Medicine

## 2015-12-08 ENCOUNTER — Ambulatory Visit (INDEPENDENT_AMBULATORY_CARE_PROVIDER_SITE_OTHER): Payer: Medicare Other | Admitting: Sports Medicine

## 2015-12-08 DIAGNOSIS — M79672 Pain in left foot: Secondary | ICD-10-CM

## 2015-12-08 DIAGNOSIS — E1140 Type 2 diabetes mellitus with diabetic neuropathy, unspecified: Secondary | ICD-10-CM

## 2015-12-08 DIAGNOSIS — L97521 Non-pressure chronic ulcer of other part of left foot limited to breakdown of skin: Secondary | ICD-10-CM

## 2015-12-08 DIAGNOSIS — G600 Hereditary motor and sensory neuropathy: Secondary | ICD-10-CM

## 2015-12-08 NOTE — Progress Notes (Signed)
Patient ID: [name], male   DOB: 05/23/1931, 82 y.o.   MRN: [id number] Subjective: [name] is a 82 y.o. male patient seen in office for follow up evaluation of healed ulceration of the left foot, sub-met 1; daughter has been applying offloading pad to site.  Patient has a history of diabetes and a blood glucose level not recorded, a1c 6, unchanged from prior. Denies nausea/fever/vomiting/chills/night sweats/shortness of breath/pain. Patient has no other pedal complaints at this time.  Patient Active Problem List   Diagnosis Date Noted  . Diabetes ([place]) 03/10/2015  . Perennial and seasonal allergic rhinitis 12/20/2014  . Hematuria 12/20/2014  . Loss of weight 05/06/2014  . Other pancytopenia ([place]) 05/05/2014  . CAP (community acquired pneumonia) 05/05/2014  . Hypocalcemia 05/05/2014  . Skin lesion 04/30/2014  . 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 Abdominal pain, right upper quadrant 05/04/2013  . Cold intolerance 05/04/2013  . Low back pain 04/03/2012  . Encounter for long-term (current) use of other medications 12/11/2011  . Routine general medical examination at a health care facility 12/17/2010  . Screening for prostate cancer 12/12/2010  . Osteoarthrosis, unspecified whether generalized or localized, unspecified site 10/24/2010  . SKIN RASH 06/29/2009  . Iron deficiency anemia 03/10/2009  . BACK PAIN, LUMBAR 03/10/2009  . [place] DISEASE 05/03/2008  . DYSPNEA 05/03/2008  . DEPRESSIVE DISORDER NOT ELSEWHERE CLASSIFIED 05/22/2007  . CHEST PAIN 05/22/2007  . ANEMIA, PERNICIOUS 12/02/2006  . HYPERTENSION 07/26/2006  . CORONARY ARTERY DISEASE 07/26/2006  . GERD 07/26/2006   Current Outpatient Prescriptions on File Prior to Visit  Medication Sig Dispense Refill  . Ascorbic Acid (VITAMIN C) 500 MG tablet Take 500 mg by mouth 2 (two) times daily.     Marland Kitchen aspirin (GOODSENSE ASPIRIN) 325 MG tablet Take 325 mg by mouth.    Marland Kitchen azelastine (ASTELIN) 0.1  % nasal spray USE 1-2 SPRAYS IN EACH NOSTRIL TWICE DAILY AS NEEDED 30 mL 5  . Blood Glucose Calibration (ADVOCATE REDI-CODE+ CONTROL) LOW SOLN   3  . Blood Glucose Monitoring Suppl (ADVOCATE REDI-CODE+) DEVI   1  . cadexomer iodine (IODOSORB) 0.9 % gel Apply 1 application topically daily as needed for wound care. 40 g 0  . CLEVER CHEK AUTO-CODE VOICE test strip     . clotrimazole-betamethasone (LOTRISONE) cream Apply topically 2 (two) times daily as needed.     . Cyanocobalamin (VITAMIN B-12 IJ) Inject as directed. GET A B12 INJECTION ONCE EVERY MONTH    . cyanocobalamin 1000 MCG tablet Take 500 mcg by mouth 2 (two) times daily.     . famotidine (PEPCID) 20 MG tablet One at bedtime 30 tablet 2  . gabapentin (NEURONTIN) 300 MG capsule Take 2 capsules (600 mg total) by mouth at bedtime. 180 capsule 3  . Inositol Niacinate (NIACIN FLUSH FREE) 500 MG CAPS Take by mouth.    Elmore Guise Devices (SIMPLE DIAGNOSTICS LANCING DEV) MISC   1  . metFORMIN (GLUCOPHAGE-XR) 500 MG 24 hr tablet Take 2 tablets (1,000 mg total) by mouth daily. 180 tablet 3  . montelukast (SINGULAIR) 10 MG tablet TAKE 1 TABLET BY MOUTH AT  BEDTIME 90 tablet 3  . Multiple Vitamins-Minerals (MULTIVITAMIN WITH MINERALS) tablet Take 1 tablet by mouth daily.      . nitroGLYCERIN (NITROSTAT) 0.4 MG SL tablet Place 0.4 mg under the tongue every 5 (five) minutes as needed.    . pantoprazole (PROTONIX) 40 MG tablet Take 1 tablet (40 mg total) by mouth daily.  Take 30-60 min before first meal of the day 30 tablet 2  . sertraline (ZOLOFT) 50 MG tablet Take 1 tablet (50 mg total) by mouth daily. 90 tablet 3  . simvastatin (ZOCOR) 80 MG tablet Take 1 tablet (80 mg total) by mouth at bedtime. 90 tablet 3  . traMADol (ULTRAM) 50 MG tablet Take 1 tablet (50 mg total) by mouth every 12 (twelve) hours as needed. 180 tablet 1  . Zinc 50 MG CAPS Take 1 capsule by mouth.       No current facility-administered medications on file prior to visit.    No  Known Allergies  Recent Results (from the past 2160 hour(s))  CBC with Differential     Status: Abnormal   Collection Time: 10/05/15 11:30 AM  Result Value Ref Range   WBC 6.4 3.4 - 10.8 x10E3/uL   RBC 3.72 (L) 4.14 - 5.80 x10E6/uL   Hemoglobin 11.2 (L) 12.6 - 17.7 g/dL   Hematocrit 34.0 (L) 37.5 - 51.0 %   MCV 91 79 - 97 fL   MCH 30.1 26.6 - 33.0 pg   MCHC 32.9 31.5 - 35.7 g/dL   RDW 13.1 12.3 - 15.4 %   Platelets 178 150 - 379 x10E3/uL   Neutrophils 72 Not Estab. %   Lymphs 14 Not Estab. %   Monocytes 13 Not Estab. %   Eos 1 Not Estab. %   Basos 0 Not Estab. %   Neutrophils Absolute 4.6 1.4 - 7.0 x10E3/uL   Lymphocytes Absolute 0.9 0.7 - 3.1 x10E3/uL   Monocytes Absolute 0.8 0.1 - 0.9 x10E3/uL   EOS (ABSOLUTE) 0.1 0.0 - 0.4 x10E3/uL   Basophils Absolute 0.0 0.0 - 0.2 x10E3/uL   Immature Granulocytes 0 Not Estab. %   Immature Grans (Abs) 0.0 0.0 - 0.1 D35T0/VX  Basic Metabolic Panel     Status: Abnormal   Collection Time: 10/05/15 11:30 AM  Result Value Ref Range   Glucose 109 (H) 65 - 99 mg/dL   BUN 19 8 - 27 mg/dL   Creatinine, Ser 0.76 0.76 - 1.27 mg/dL   GFR calc non Af Amer 86 >59 mL/min/1.73   GFR calc Af Amer 99 >59 mL/min/1.73   BUN/Creatinine Ratio 25 (H) 10 - 24   Sodium 140 134 - 144 mmol/L   Potassium 5.0 3.5 - 5.2 mmol/L   Chloride 102 96 - 106 mmol/L   CO2 25 18 - 29 mmol/L   Calcium 9.3 8.6 - 10.2 mg/dL  Sedimentation Rate     Status: None   Collection Time: 10/05/15 11:30 AM  Result Value Ref Range   Sed Rate 21 0 - 30 mm/hr  C-reactive protein     Status: None   Collection Time: 10/05/15 11:30 AM  Result Value Ref Range   CRP 4.5 0.0 - 4.9 mg/L  Uric Acid     Status: None   Collection Time: 10/05/15 11:30 AM  Result Value Ref Range   Uric Acid 4.6 3.7 - 8.6 mg/dL    Comment:            Therapeutic target for gout patients: <6.0  POCT HgB A1C     Status: None   Collection Time: 12/05/15 10:24 AM  Result Value Ref Range   Hemoglobin A1C 6.0      Objective: There were no vitals filed for this visit.  General: Patient is awake, alert, oriented x 3 and in no acute distress.  Dermatology: Skin is warm and dry bilateral  with a continued healed ulceration Left sub-met 1;no opening, no malodor,  no erythema, decreased edema, There is mild keratosis at left medial heel with no signs of infection. No other acute signs of infection.   Vascular: Dorsalis Pedis pulse = 1/4 Bilateral,  Posterior Tibial pulse = 1/4 Bilateral,  Capillary Fill Time < 5 seconds  Neurologic: Protective sensation absent bilateral using the 5.07/10g Semmes Weinstein Monofilament.  Musculosketal: There is decreased ankle joint range of motion Bilateral. There is decreased Subtalar joint range of motion Bilateral. There is a decrease in 1st metatarsophalangeal joint range of motion Bilateral. No Pain with palpation to previously ulcerated area, left foot. No pain with compression to calves bilateral. Hammertoes with Muscle weakness requiring bracing bilateral from Charcot-Marie-Tooth.  Assessment and Plan:  Problem List Items Addressed This Visit      Nervous and Auditory   CHARCOT-MARIE-TOOTH DISEASE    Other Visit Diagnoses    Foot ulcer, left, limited to breakdown of skin ([place])    -  Primary   healed   Type 2 diabetes, controlled, with neuropathy ([place])       Left foot pain         -Examined patient and discussed the progression of the healed wound and plan of care. -Left sub met ulceration is healed, continue with custom shoe with brace and offloading pad to left foot -Continue with skin emollients to keratotic heel and dry skin areas -Patient to return to office in 6-8 weeks for follow up care/final healed ulcer check or sooner if problems arise.    Landis Martins, DPM

## 2015-12-30 DIAGNOSIS — J9610 Chronic respiratory failure, unspecified whether with hypoxia or hypercapnia: Secondary | ICD-10-CM | POA: Diagnosis not present

## 2016-01-23 IMAGING — US US ABDOMEN COMPLETE
1 series · 14 of 25 positions shown · non-contrast
Comparison: None.

CLINICAL DATA: Abdominal pain. Right upper quadrant. History of
leukopenia.

EXAM:
ULTRASOUND ABDOMEN COMPLETE

[Series 1: us abdomen complete · 0.35mm/px · 14 of 66 slices shown]
[im 1/66]
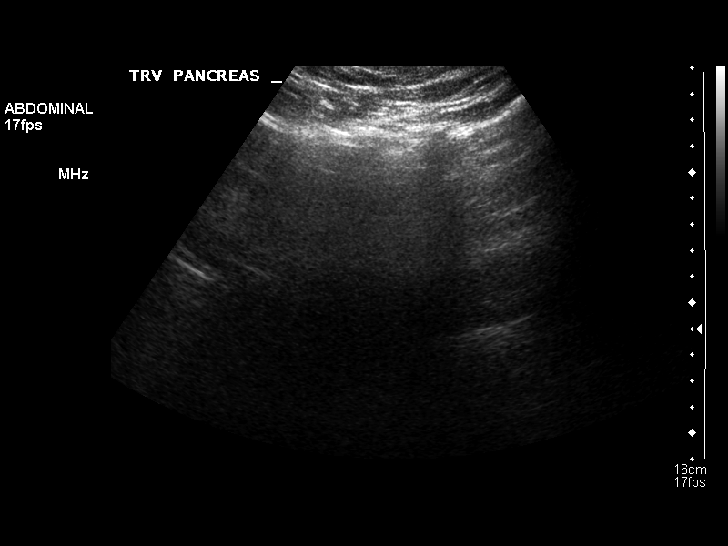
[im 6/66]
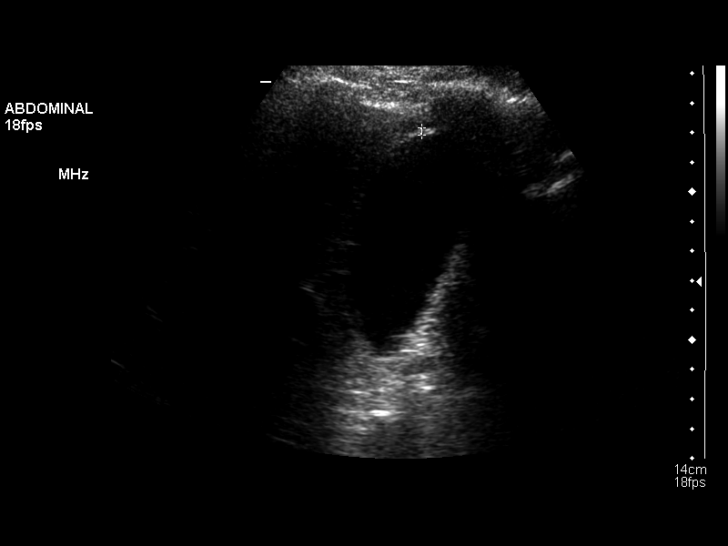
[im 11/66]
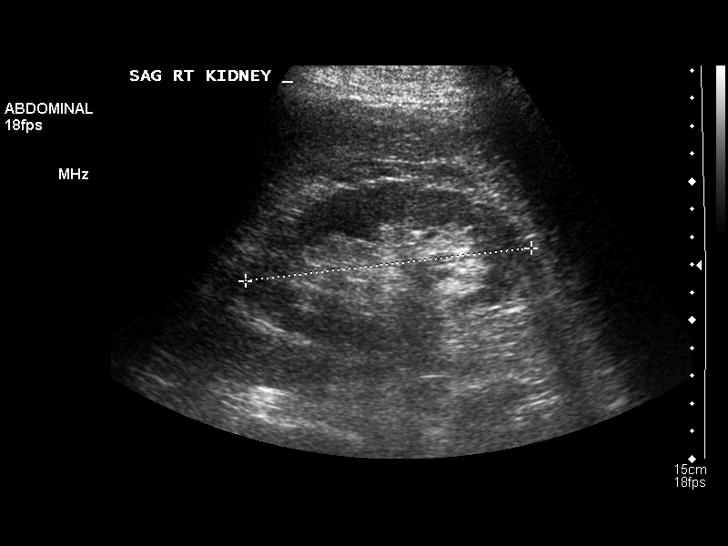
[im 17/66]
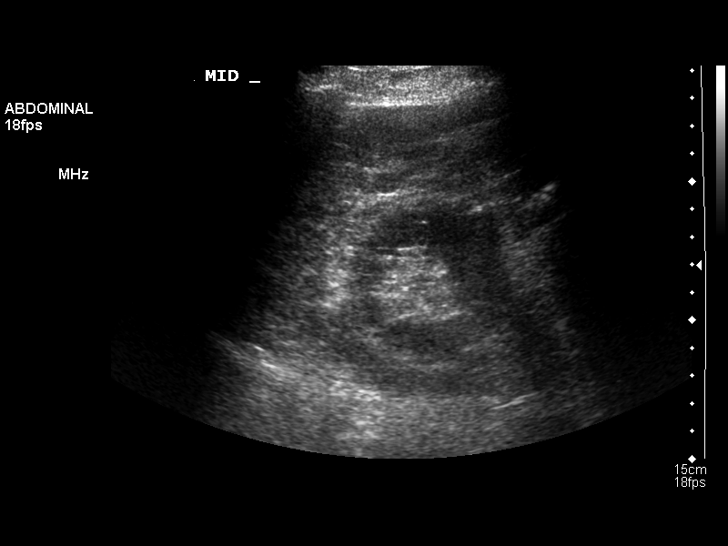
[im 22/66]
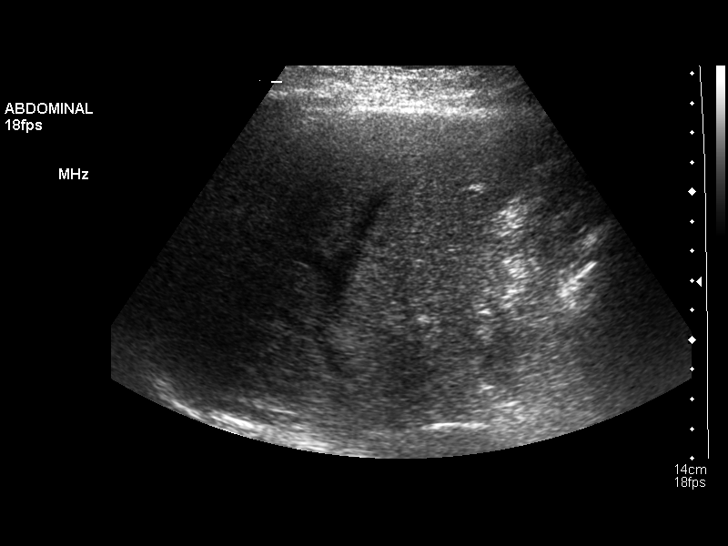
[im 25/66]
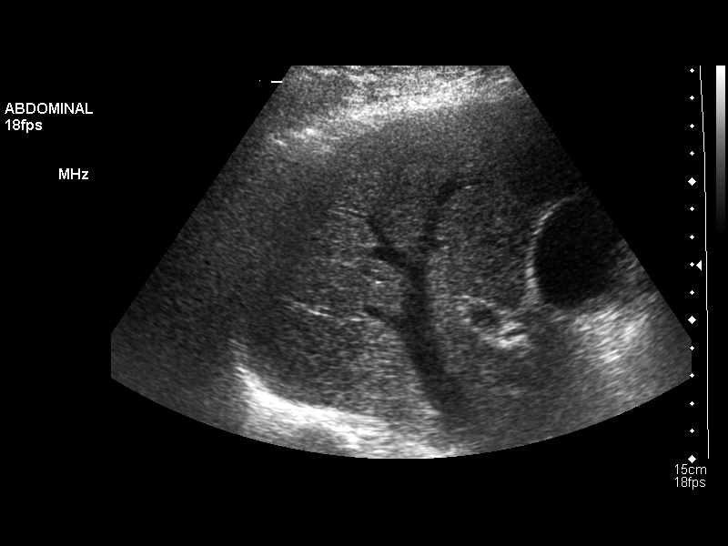
[im 30/66]
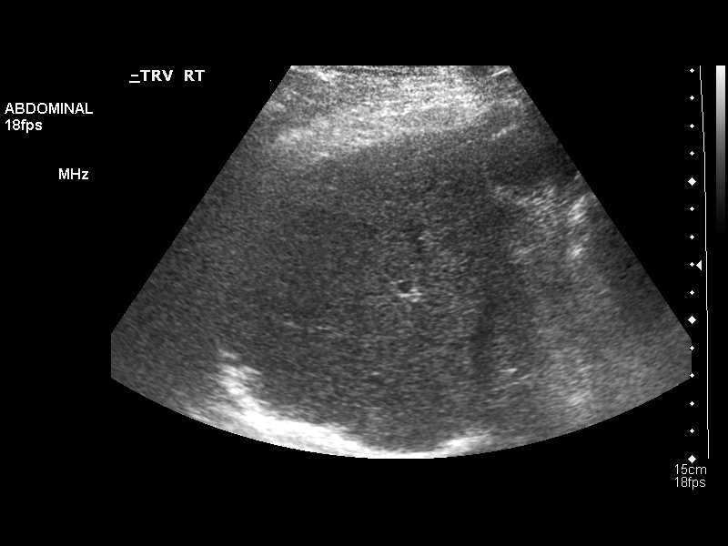
[im 36/66]
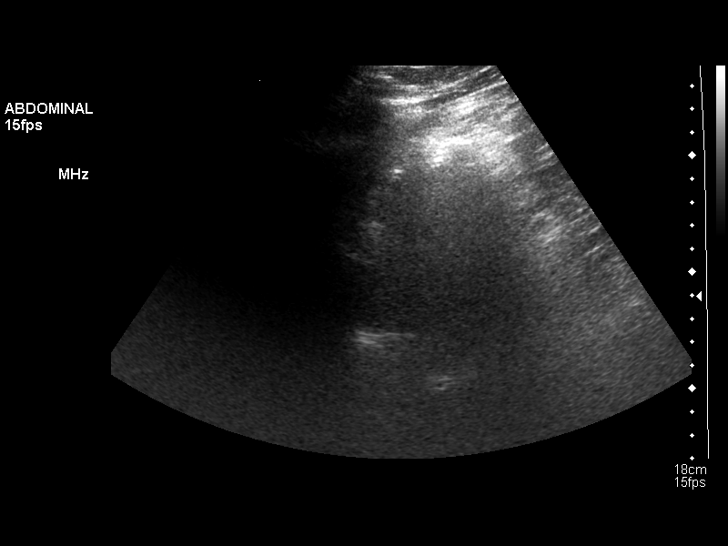
[im 41/66]
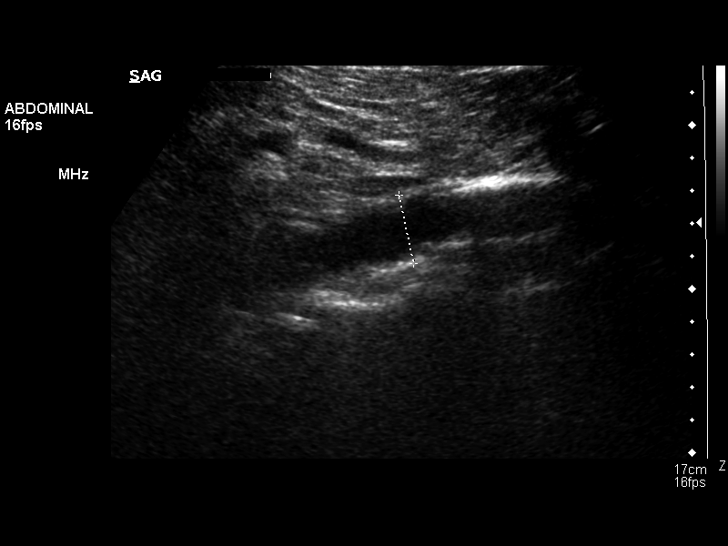
[im 44/66]
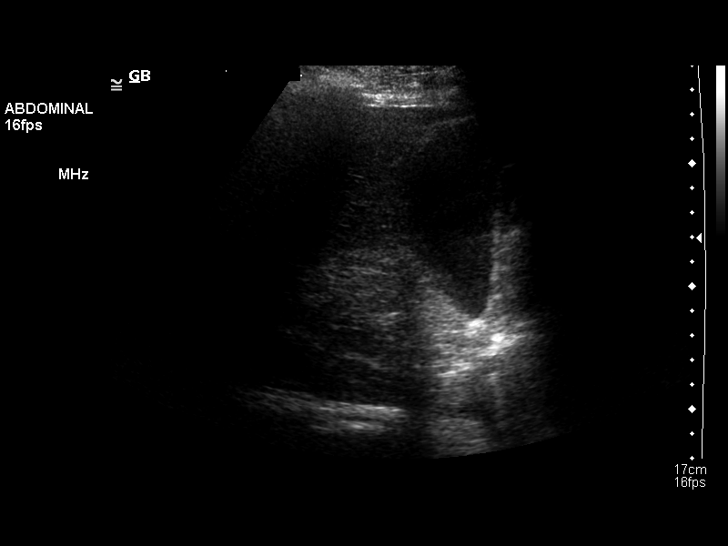
[im 49/66]
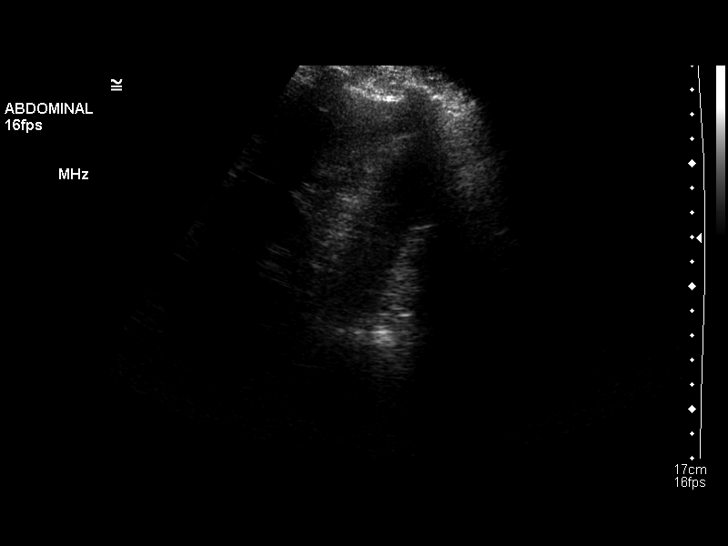
[im 55/66]
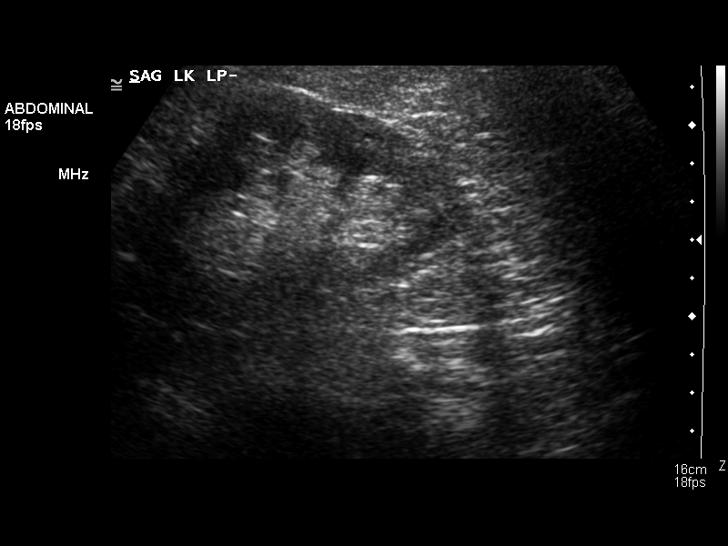
[im 60/66]
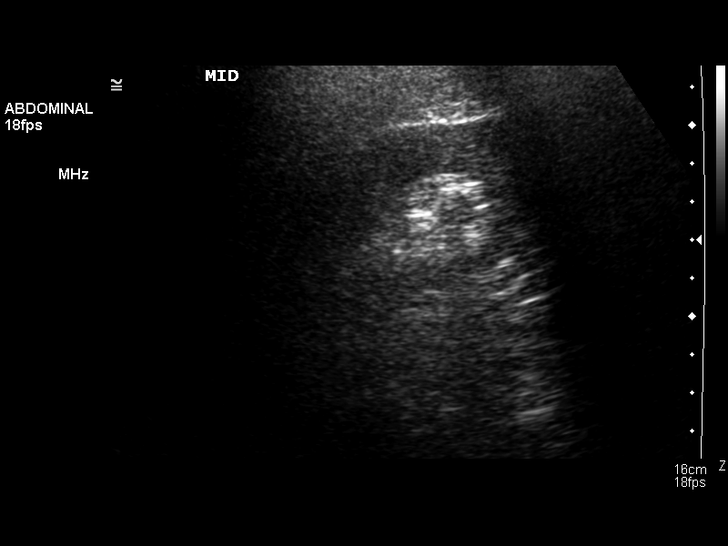
[im 66/66]
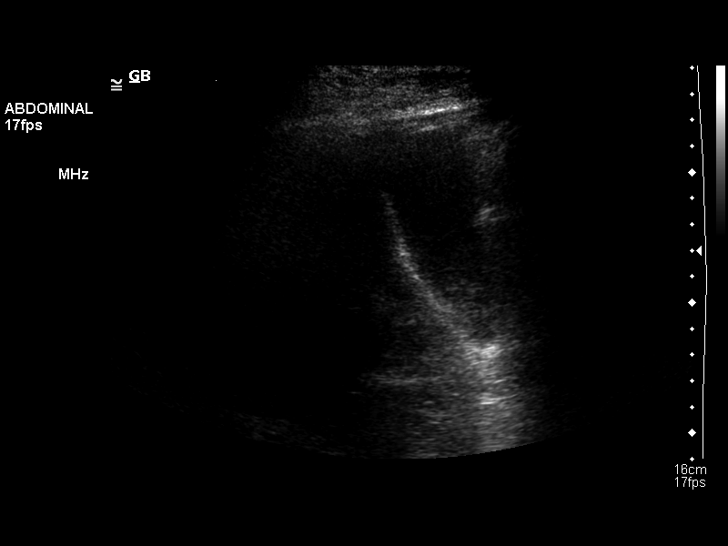

[14 of 25 positions shown; findings below may reference images not displayed]

FINDINGS: Gallbladder:

No gallstones or wall thickening visualized. No sonographic Murphy
sign noted.

Common bile duct:

Diameter: Normal, 4 mm.

Liver:

Partially obscured by bowel gas.  Normal in its visualized portions.

IVC:

Poorly visualized due to overlying bowel gas.

Pancreas:

Poorly visualized due to overlying bowel gas.

Spleen:

Size and appearance within normal limits.

Right Kidney:

Length: 10.7 cm. Echogenicity within normal limits. No mass or
hydronephrosis visualized.

Left Kidney:

Length: 10.4 cm. Echogenicity within normal limits. No mass or
hydronephrosis visualized.

Abdominal aorta:

No aneurysm visualized.

Other findings:

No ascites. Exam overall limited secondary to bowel gas and patient
body habitus.
IMPRESSION: 1. Decreased sensitivity and specificity exam due to technique
related factors, as described above.
2. Given these factors, no acute process or explanation for
patient's symptoms.

## 2016-01-30 DIAGNOSIS — J9610 Chronic respiratory failure, unspecified whether with hypoxia or hypercapnia: Secondary | ICD-10-CM | POA: Diagnosis not present

## 2016-02-02 ENCOUNTER — Ambulatory Visit: Payer: Medicare Other | Admitting: Sports Medicine

## 2016-02-02 DIAGNOSIS — H2513 Age-related nuclear cataract, bilateral: Secondary | ICD-10-CM | POA: Diagnosis not present

## 2016-02-02 DIAGNOSIS — E119 Type 2 diabetes mellitus without complications: Secondary | ICD-10-CM | POA: Diagnosis not present

## 2016-02-02 DIAGNOSIS — H524 Presbyopia: Secondary | ICD-10-CM | POA: Diagnosis not present

## 2016-02-06 ENCOUNTER — Other Ambulatory Visit: Payer: Self-pay | Admitting: Neurology

## 2016-02-08 ENCOUNTER — Encounter: Payer: Self-pay | Admitting: Sports Medicine

## 2016-02-08 ENCOUNTER — Ambulatory Visit (INDEPENDENT_AMBULATORY_CARE_PROVIDER_SITE_OTHER): Payer: Medicare Other | Admitting: Sports Medicine

## 2016-02-08 DIAGNOSIS — B351 Tinea unguium: Secondary | ICD-10-CM | POA: Diagnosis not present

## 2016-02-08 DIAGNOSIS — G600 Hereditary motor and sensory neuropathy: Secondary | ICD-10-CM

## 2016-02-08 DIAGNOSIS — L97521 Non-pressure chronic ulcer of other part of left foot limited to breakdown of skin: Secondary | ICD-10-CM

## 2016-02-08 DIAGNOSIS — E1140 Type 2 diabetes mellitus with diabetic neuropathy, unspecified: Secondary | ICD-10-CM

## 2016-02-08 NOTE — Progress Notes (Signed)
Patient ID: KEY CEN, male   DOB: 1931-03-20, 83 y.o.   MRN: [id number]  Subjective: [name] is a 83 y.o. male patient seen in office for follow up evaluation of healed ulceration of the left foot, sub-met 1; daughter admits to a blister a few weeks ago that she lanced and it finally draining and then went away. Patient has a history of diabetes and a blood glucose level not recorded. Patient desires nails to be trimmed. Patient has no other pedal complaints at this time.  Patient Active Problem List   Diagnosis Date Noted  . Diabetes ([place]) 03/10/2015  . Perennial and seasonal allergic rhinitis 12/20/2014  . Hematuria 12/20/2014  . Loss of weight 05/06/2014  . Other pancytopenia ([place]) 05/05/2014  . CAP (community acquired pneumonia) 05/05/2014  . Hypocalcemia 05/05/2014  . Skin lesion 04/30/2014  . 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 Abdominal pain, right upper quadrant 05/04/2013  . Cold intolerance 05/04/2013  . Low back pain 04/03/2012  . Encounter for long-term (current) use of other medications 12/11/2011  . Routine general medical examination at a health care facility 12/17/2010  . Screening for prostate cancer 12/12/2010  . Osteoarthrosis, unspecified whether generalized or localized, unspecified site 10/24/2010  . SKIN RASH 06/29/2009  . Iron deficiency anemia 03/10/2009  . BACK PAIN, LUMBAR 03/10/2009  . [place] DISEASE 05/03/2008  . DYSPNEA 05/03/2008  . DEPRESSIVE DISORDER NOT ELSEWHERE CLASSIFIED 05/22/2007  . CHEST PAIN 05/22/2007  . ANEMIA, PERNICIOUS 12/02/2006  . HYPERTENSION 07/26/2006  . CORONARY ARTERY DISEASE 07/26/2006  . GERD 07/26/2006   Current Outpatient Prescriptions on File Prior to Visit  Medication Sig Dispense Refill  . Ascorbic Acid (VITAMIN C) 500 MG tablet Take 500 mg by mouth 2 (two) times daily.     Marland Kitchen aspirin (GOODSENSE ASPIRIN) 325 MG tablet Take 325 mg by mouth.    Marland Kitchen azelastine (ASTELIN) 0.1 % nasal  spray USE 1-2 SPRAYS IN EACH NOSTRIL TWICE DAILY AS NEEDED 30 mL 5  . Blood Glucose Calibration (ADVOCATE REDI-CODE+ CONTROL) LOW SOLN   3  . Blood Glucose Monitoring Suppl (ADVOCATE REDI-CODE+) DEVI   1  . cadexomer iodine (IODOSORB) 0.9 % gel Apply 1 application topically daily as needed for wound care. 40 g 0  . CLEVER CHEK AUTO-CODE VOICE test strip     . clotrimazole-betamethasone (LOTRISONE) cream Apply topically 2 (two) times daily as needed.     . Cyanocobalamin (VITAMIN B-12 IJ) Inject as directed. GET A B12 INJECTION ONCE EVERY MONTH    . cyanocobalamin 1000 MCG tablet Take 500 mcg by mouth 2 (two) times daily.     . famotidine (PEPCID) 20 MG tablet One at bedtime 30 tablet 2  . gabapentin (NEURONTIN) 300 MG capsule Take 2 capsules (600 mg total) by mouth at bedtime. 180 capsule 3  . Inositol Niacinate (NIACIN FLUSH FREE) 500 MG CAPS Take by mouth.    Elmore Guise Devices (SIMPLE DIAGNOSTICS LANCING DEV) MISC   1  . metFORMIN (GLUCOPHAGE-XR) 500 MG 24 hr tablet Take 2 tablets (1,000 mg total) by mouth daily. 180 tablet 3  . montelukast (SINGULAIR) 10 MG tablet TAKE 1 TABLET BY MOUTH AT  BEDTIME 90 tablet 3  . Multiple Vitamins-Minerals (MULTIVITAMIN WITH MINERALS) tablet Take 1 tablet by mouth daily.      . nitroGLYCERIN (NITROSTAT) 0.4 MG SL tablet Place 0.4 mg under the tongue every 5 (five) minutes as needed.    . pantoprazole (PROTONIX) 40 MG tablet Take  1 tablet (40 mg total) by mouth daily. Take 30-60 min before first meal of the day 30 tablet 2  . sertraline (ZOLOFT) 50 MG tablet Take 1 tablet (50 mg total) by mouth daily. 90 tablet 3  . simvastatin (ZOCOR) 80 MG tablet Take 1 tablet (80 mg total) by mouth at bedtime. 90 tablet 3  . traMADol (ULTRAM) 50 MG tablet Take 1 tablet (50 mg total) by mouth every 12 (twelve) hours as needed. 180 tablet 1  . Zinc 50 MG CAPS Take 1 capsule by mouth.       No current facility-administered medications on file prior to visit.    No Known  Allergies  Recent Results (from the past 2160 hour(s))  POCT HgB A1C     Status: None   Collection Time: 12/05/15 10:24 AM  Result Value Ref Range   Hemoglobin A1C 6.0     Objective: There were no vitals filed for this visit.  General: Patient is awake, alert, oriented x 3 and in no acute distress.  Dermatology: Skin is warm and dry bilateral with a continued healed ulceration Left sub-met 1; no opening, no malodor,  no erythema, decreased edema, There is mild keratosis at left medial heel with no signs of infection. Nails x 10 mycotic. No other acute signs of infection.   Vascular: Dorsalis Pedis pulse = 1/4 Bilateral,  Posterior Tibial pulse = 1/4 Bilateral,  Capillary Fill Time < 5 seconds  Neurologic: Protective sensation absent bilateral using the 5.07/10g Semmes Weinstein Monofilament.  Musculosketal: There is decreased ankle joint range of motion Bilateral. There is decreased Subtalar joint range of motion Bilateral. There is a decrease in 1st metatarsophalangeal joint range of motion Bilateral. No Pain with palpation to previously ulcerated area, left foot. No pain with compression to calves bilateral. Hammertoes with Muscle weakness requiring bracing bilateral from Charcot-Marie-Tooth.  Assessment and Plan:  Problem List Items Addressed This Visit      Nervous and Auditory   CHARCOT-MARIE-TOOTH DISEASE    Other Visit Diagnoses    Foot ulcer, left, limited to breakdown of skin ([place])    -  Primary   healed   Onychomycosis       Type 2 diabetes, controlled, with neuropathy ([place])         -Examined patient and discussed the progression of the healed wound and plan of care. -Left sub met ulceration remains healed, continue with custom shoe with brace and offloading pad to left foot -Continue with skin emollients to keratotic heel and dry skin areas -Mechanically debrided nails x 10 using sterile nail nipper without incident -Patient to return to office in 8 weeks for follow  up care/final healed ulcer check and f/u diabetic nail care or sooner if problems arise.    Landis Martins, DPM

## 2016-02-29 DIAGNOSIS — J9610 Chronic respiratory failure, unspecified whether with hypoxia or hypercapnia: Secondary | ICD-10-CM | POA: Diagnosis not present

## 2016-03-05 ENCOUNTER — Ambulatory Visit (INDEPENDENT_AMBULATORY_CARE_PROVIDER_SITE_OTHER): Payer: Medicare Other | Admitting: Endocrinology

## 2016-03-05 ENCOUNTER — Other Ambulatory Visit: Payer: Self-pay

## 2016-03-05 ENCOUNTER — Encounter: Payer: Self-pay | Admitting: Endocrinology

## 2016-03-05 ENCOUNTER — Other Ambulatory Visit: Payer: Self-pay | Admitting: Endocrinology

## 2016-03-05 VITALS — BP 122/84 | HR 64 | Ht 70.0 in | Wt 194.0 lb

## 2016-03-05 DIAGNOSIS — Z125 Encounter for screening for malignant neoplasm of prostate: Secondary | ICD-10-CM | POA: Diagnosis not present

## 2016-03-05 DIAGNOSIS — D509 Iron deficiency anemia, unspecified: Secondary | ICD-10-CM

## 2016-03-05 DIAGNOSIS — Z0000 Encounter for general adult medical examination without abnormal findings: Secondary | ICD-10-CM

## 2016-03-05 DIAGNOSIS — E1159 Type 2 diabetes mellitus with other circulatory complications: Secondary | ICD-10-CM

## 2016-03-05 DIAGNOSIS — E538 Deficiency of other specified B group vitamins: Secondary | ICD-10-CM | POA: Diagnosis not present

## 2016-03-05 DIAGNOSIS — D61818 Other pancytopenia: Secondary | ICD-10-CM

## 2016-03-05 LAB — IBC PANEL
IRON: 104 ug/dL (ref 42–165)
SATURATION RATIOS: 30 % (ref 20.0–50.0)
TRANSFERRIN: 248 mg/dL (ref 212.0–360.0)

## 2016-03-05 LAB — URINALYSIS, ROUTINE W REFLEX MICROSCOPIC
BILIRUBIN URINE: NEGATIVE
Hgb urine dipstick: NEGATIVE
Ketones, ur: NEGATIVE
LEUKOCYTES UA: NEGATIVE
NITRITE: NEGATIVE
PH: 6 (ref 5.0–8.0)
SPECIFIC GRAVITY, URINE: 1.01 (ref 1.000–1.030)
Total Protein, Urine: NEGATIVE
URINE GLUCOSE: NEGATIVE
Urobilinogen, UA: 0.2 (ref 0.0–1.0)

## 2016-03-05 LAB — LIPID PANEL
CHOL/HDL RATIO: 3
Cholesterol: 147 mg/dL (ref 0–200)
HDL: 56 mg/dL (ref >39.00)
LDL CALC: 72 mg/dL (ref 0–99)
NonHDL: 90.9
Triglycerides: 93 mg/dL (ref 0.0–149.0)
VLDL: 18.6 mg/dL (ref 0.0–40.0)

## 2016-03-05 LAB — HEPATIC FUNCTION PANEL
ALBUMIN: 4.3 g/dL (ref 3.5–5.2)
ALT: 17 U/L (ref 0–53)
AST: 21 U/L (ref 0–37)
Alkaline Phosphatase: 42 U/L (ref 39–117)
Bilirubin, Direct: 0.1 mg/dL (ref 0.0–0.3)
TOTAL PROTEIN: 6.3 g/dL (ref 6.0–8.3)
Total Bilirubin: 0.4 mg/dL (ref 0.2–1.2)

## 2016-03-05 LAB — TSH: TSH: 1.61 u[IU]/mL (ref 0.35–4.50)

## 2016-03-05 LAB — BASIC METABOLIC PANEL
BUN: 21 mg/dL (ref 6–23)
CHLORIDE: 106 meq/L (ref 96–112)
CO2: 27 meq/L (ref 19–32)
Calcium: 9.5 mg/dL (ref 8.4–10.5)
Creatinine, Ser: 0.7 mg/dL (ref 0.40–1.50)
GFR: 114.67 mL/min (ref >60.00)
Glucose, Bld: 108 mg/dL — ABNORMAL HIGH (ref 70–99)
Potassium: 4.8 mEq/L (ref 3.5–5.1)
Sodium: 141 mEq/L (ref 135–145)

## 2016-03-05 LAB — MICROALBUMIN / CREATININE URINE RATIO
CREATININE, U: 70.2 mg/dL
Microalb Creat Ratio: 4.8 mg/g (ref 0.0–30.0)
Microalb, Ur: 3.4 mg/dL — ABNORMAL HIGH (ref 0.0–1.9)

## 2016-03-05 LAB — CBC WITH DIFFERENTIAL/PLATELET
BASOS ABS: 0 10*3/uL (ref 0.0–0.1)
Basophils Relative: 0.5 % (ref 0.0–3.0)
EOS PCT: 2.7 % (ref 0.0–5.0)
Eosinophils Absolute: 0.2 10*3/uL (ref 0.0–0.7)
HCT: 36.5 % — ABNORMAL LOW (ref 39.0–52.0)
HEMOGLOBIN: 11.9 g/dL — AB (ref 13.0–17.0)
LYMPHS PCT: 18.5 % (ref 12.0–46.0)
Lymphs Abs: 1 10*3/uL (ref 0.7–4.0)
MCHC: 32.7 g/dL (ref 30.0–36.0)
MCV: 89.9 fl (ref 78.0–100.0)
MONOS PCT: 10.2 % (ref 3.0–12.0)
Monocytes Absolute: 0.6 10*3/uL (ref 0.1–1.0)
NEUTROS PCT: 68.1 % (ref 43.0–77.0)
Neutro Abs: 3.7 10*3/uL (ref 1.4–7.7)
Platelets: 185 10*3/uL (ref 150.0–400.0)
RBC: 4.06 Mil/uL — AB (ref 4.22–5.81)
RDW: 14.1 % (ref 11.5–15.5)
WBC: 5.5 10*3/uL (ref 4.0–10.5)

## 2016-03-05 LAB — HEMOGLOBIN A1C: Hgb A1c MFr Bld: 6.6 % — ABNORMAL HIGH (ref 4.6–6.5)

## 2016-03-05 LAB — VITAMIN D 25 HYDROXY (VIT D DEFICIENCY, FRACTURES): VITD: 63.79 ng/mL (ref 30.00–100.00)

## 2016-03-05 LAB — POCT GLYCOSYLATED HEMOGLOBIN (HGB A1C): Hemoglobin A1C: 6.1

## 2016-03-05 LAB — PSA: PSA: 2.87 ng/mL (ref 0.10–4.00)

## 2016-03-05 MED ORDER — GABAPENTIN 300 MG PO CAPS: 600.0000 mg | ORAL_CAPSULE | Freq: Every day | ORAL | 3 refills | Status: DC

## 2016-03-05 MED ORDER — MONTELUKAST SODIUM 10 MG PO TABS: 10.0000 mg | ORAL_TABLET | Freq: Every day | ORAL | 3 refills | Status: DC

## 2016-03-05 MED ORDER — CYANOCOBALAMIN 1000 MCG/ML IJ SOLN
1000.0000 ug | Freq: Once | INTRAMUSCULAR | Status: AC
  Administered 2016-03-05: 1000 ug via INTRAMUSCULAR

## 2016-03-05 MED ORDER — TRAMADOL HCL 50 MG PO TABS: 50.0000 mg | ORAL_TABLET | Freq: Two times a day (BID) | ORAL | 1 refills | Status: DC | PRN

## 2016-03-05 MED ORDER — SERTRALINE HCL 50 MG PO TABS: 50.0000 mg | ORAL_TABLET | Freq: Every day | ORAL | 3 refills | Status: DC

## 2016-03-05 MED ORDER — METFORMIN HCL ER 500 MG PO TB24: 1000.0000 mg | ORAL_TABLET | Freq: Every day | ORAL | 3 refills | Status: DC

## 2016-03-05 MED ORDER — SIMVASTATIN 80 MG PO TABS: 80.0000 mg | ORAL_TABLET | Freq: Every day | ORAL | 3 refills | Status: DC

## 2016-03-05 NOTE — Patient Instructions (Addendum)
Please consider these measures for your health:  minimize alcohol.  Do not use tobacco products.  Have a colonoscopy at least every 10 years from age 89.  Keep firearms safely stored.  Always use seat belts.  have working smoke alarms in your home.  See an eye doctor and dentist regularly.  Never drive under the influence of alcohol or drugs (including prescription drugs).  Those with fair skin should take precautions against the sun, and should carefully examine their skin once per month, for any new or changed moles. It is critically important to prevent falling down (keep floor areas well-lit, dry, and free of loose objects.  If you have a cane, walker, or wheelchair, you should use it, even for short trips around the house.  Wear flat-soled shoes.  Also, try not to rush).   blood and urine tests are requested for you today.  We'll let you know about the results.  Please come back for a follow-up appointment in 6 months.

## 2016-03-05 NOTE — Progress Notes (Signed)
Subjective:    Patient ID: [name], male    DOB: Feb 04, 1931, 83 y.o.   MRN: [id number]  HPI Pt is here for regular wellness examination, and is feeling pretty well in general, and says chronic med probs are stable, except as noted below Past Medical History:  Diagnosis Date  . ANEMIA, IRON DEFICIENCY   . BACK PAIN, LUMBAR   . Charcot-Marie-Tooth disease    assoiciated muscular dystrophy   . COPD (chronic obstructive pulmonary disease) (HCC)   . Coronary artery disease   . DIABETES MELLITUS, TYPE II   . GERD (gastroesophageal reflux disease)   . H/O: asbestos exposure   . Hypertension   . Leukopenia   . Pernicious anemia   . Smoker    quit 1968--25 ppy    Past Surgical History:  Procedure Laterality Date  . CORONARY ARTERY BYPASS GRAFT  1995    Social History   Social History  . Marital status: Widowed    Spouse name: N/A  . Number of children: N/A  . Years of education: N/A   Occupational History  . Retired Retired    Games developer   Social History Main Topics  . Smoking status: Former Smoker    Packs/day: 1.00    Years: 20.00    Types: Cigarettes    Quit date: 01/01/1966  . Smokeless tobacco: Never Used  . Alcohol use No  . Drug use: No  . Sexual activity: Not on file   Other Topics Concern  . Not on file   Social History Narrative   Was in service during [place] War.  Lives alone in a one story home.  On disability.            Current Outpatient Prescriptions on File Prior to Visit  Medication Sig Dispense Refill  . Ascorbic Acid (VITAMIN C) 500 MG tablet Take 500 mg by mouth 2 (two) times daily.     Marland Kitchen aspirin (GOODSENSE ASPIRIN) 325 MG tablet Take 325 mg by mouth.    Marland Kitchen azelastine (ASTELIN) 0.1 % nasal spray USE 1-2 SPRAYS IN EACH NOSTRIL TWICE DAILY AS NEEDED 30 mL 5  . Blood Glucose Calibration (ADVOCATE REDI-CODE+ CONTROL) LOW SOLN   3  . Blood Glucose Monitoring Suppl (ADVOCATE REDI-CODE+) DEVI   1  . cadexomer iodine (IODOSORB) 0.9 %  gel Apply 1 application topically daily as needed for wound care. 40 g 0  . CLEVER CHEK AUTO-CODE VOICE test strip     . clotrimazole-betamethasone (LOTRISONE) cream Apply topically 2 (two) times daily as needed.     . Cyanocobalamin (VITAMIN B-12 IJ) Inject as directed. GET A B12 INJECTION ONCE EVERY MONTH    . cyanocobalamin 1000 MCG tablet Take 500 mcg by mouth 2 (two) times daily.     . famotidine (PEPCID) 20 MG tablet One at bedtime 30 tablet 2  . Inositol Niacinate (NIACIN FLUSH FREE) 500 MG CAPS Take by mouth.    Demetra Shiner Devices (SIMPLE DIAGNOSTICS LANCING DEV) MISC   1  . Multiple Vitamins-Minerals (MULTIVITAMIN WITH MINERALS) tablet Take 1 tablet by mouth daily.      . pantoprazole (PROTONIX) 40 MG tablet Take 1 tablet (40 mg total) by mouth daily. Take 30-60 min before first meal of the day 30 tablet 2  . Zinc 50 MG CAPS Take 1 capsule by mouth.      . nitroGLYCERIN (NITROSTAT) 0.4 MG SL tablet Place 0.4 mg under the tongue every 5 (five) minutes as needed.  No current facility-administered medications on file prior to visit.     No Known Allergies  Family History  Problem Relation Age of Onset  . Charcot-Marie-Tooth disease Sister   . Charcot-Marie-Tooth disease Brother   . Charcot-Marie-Tooth disease Brother   . Charcot-Marie-Tooth disease Brother   . Charcot-Marie-Tooth disease Sister   . Charcot-Marie-Tooth disease Son   . Charcot-Marie-Tooth disease Other   . COPD Daughter     never smoker    BP 122/84   Pulse 64   Ht 5\' 10"  (1.778 m)   Wt 194 lb (88 kg)   SpO2 97%   BMI 27.84 kg/m     Review of Systems Constitutional: no weight change.  HENT: Negative for hearing loss.   Eyes: Negative for visual disturbance.  Respiratory: Negative for wheezing.   Cardiovascular: he seldom has chest pain.  Endocrine: Positive for cold intolerance.  GI: no BRBPR Genitourinary: Negative for hematuria and difficulty urinating.  Musculoskeletal: Negative for back  pain.  Skin: Negative for rash.  Allergic/Immunologic: Positive for environmental allergies.  Neurological: Negative for syncope.  Psychiatric/Behavioral: Negative for sleep disturbance.     Objective:   Physical Exam VS: see vs page GEN: no distress NECK: supple, thyroid is not enlarged CHEST WALL: no deformity LUNGS: clear to auscultation, except for decreased BS at the bases.   BREASTS:  No gynecomastia CV: reg rate and rhythm, no murmur.  1+ bilat leg edema.  ABD: abdomen is soft, nontender.  no hepatosplenomegaly.  not distended.  no hernia.   GENITALIA/RECTAL/PROSTATE: declined.  MUSCULOSKELETAL: muscle bulk and strength are grossly normal.  no obvious joint swelling.  gait is steady with a cane EXTEMITIES: no edema. no deformity of the feet. There is bilateral onychomycosis of the toenails. PULSES: no carotid bruit. dorsalis pedis intact bilat, but reduced from normal.  NEURO:  cn 2-12 grossly intact.   readily moves all 4's, but strength is decreased throughout the LE's.  sensation is intact to touch on the LE's, but decreased from normal SKIN:  Normal texture and temperature.  No suspicious lesion is visible.  Seborrhea is noted on the face. Several heavy calluses at the plantar aspect of the left foot (sees podiatry).  the skin on the feet is dry.  feet are slightly cool to touch.  Old healed surgical scar at the right leg (vein harvest).  NODES:  None palpable at the neck.  PSYCH: alert, well-oriented.  Does not appear anxious nor depressed.        Assessment & Plan:  Wellness visit today, with problems stable, except as noted.

## 2016-03-06 LAB — PTH, INTACT AND CALCIUM
Calcium: 9.5 mg/dL (ref 8.6–10.3)
PTH: 41 pg/mL (ref 14–64)

## 2016-03-07 ENCOUNTER — Telehealth: Payer: Self-pay | Admitting: Endocrinology

## 2016-03-07 MED ORDER — GABAPENTIN 300 MG PO CAPS: 600.0000 mg | ORAL_CAPSULE | Freq: Every day | ORAL | 3 refills | Status: DC

## 2016-03-07 NOTE — Telephone Encounter (Signed)
Pt called in requesting call back from [facility], he would like to know why all of his medications were not called in.

## 2016-03-07 NOTE — Telephone Encounter (Signed)
I contacted the patient and advised on 03/06/2016 on 03/05/2016 Gabapentin, tramadol, simvastatin, zoloft, singular and metformin [facility]. I contacted the patient today and advised of this again. Patient stated the pharmacy did not received the gabapentin or Tramadol. Rx's resubmitted for these two medications.

## 2016-03-21 ENCOUNTER — Telehealth: Payer: Self-pay | Admitting: Endocrinology

## 2016-03-21 MED ORDER — GABAPENTIN 300 MG PO CAPS: 600.0000 mg | ORAL_CAPSULE | Freq: Every day | ORAL | 3 refills | Status: DC

## 2016-03-21 MED ORDER — TRAMADOL HCL 50 MG PO TABS: 50.0000 mg | ORAL_TABLET | Freq: Two times a day (BID) | ORAL | 1 refills | Status: DC | PRN

## 2016-03-21 NOTE — Telephone Encounter (Signed)
I contacted the patient. Patient stated [facility] never got the rx for tramadol from 03/05/2016. The rx has been put the shred since then, ok to print another one?

## 2016-03-21 NOTE — Telephone Encounter (Signed)
Rx resubmitted

## 2016-03-21 NOTE — Telephone Encounter (Signed)
I printed  

## 2016-03-21 NOTE — Telephone Encounter (Signed)
Sorry this is a Dr Everardo AllEllison pt  Pt requesting a call back about meds please, I did ask if there was any other info I could give you but all he wanted was to discuss meds.

## 2016-03-23 ENCOUNTER — Encounter: Payer: Self-pay | Admitting: Sports Medicine

## 2016-03-23 ENCOUNTER — Ambulatory Visit (INDEPENDENT_AMBULATORY_CARE_PROVIDER_SITE_OTHER): Payer: Medicare Other | Admitting: Sports Medicine

## 2016-03-23 DIAGNOSIS — L97521 Non-pressure chronic ulcer of other part of left foot limited to breakdown of skin: Secondary | ICD-10-CM

## 2016-03-23 DIAGNOSIS — E1140 Type 2 diabetes mellitus with diabetic neuropathy, unspecified: Secondary | ICD-10-CM

## 2016-03-23 DIAGNOSIS — L02619 Cutaneous abscess of unspecified foot: Secondary | ICD-10-CM

## 2016-03-23 DIAGNOSIS — L03119 Cellulitis of unspecified part of limb: Secondary | ICD-10-CM

## 2016-03-23 DIAGNOSIS — G600 Hereditary motor and sensory neuropathy: Secondary | ICD-10-CM

## 2016-03-23 MED ORDER — GABAPENTIN 300 MG PO CAPS: 600.0000 mg | ORAL_CAPSULE | Freq: Every day | ORAL | 3 refills | Status: DC

## 2016-03-23 MED ORDER — AMOXICILLIN-POT CLAVULANATE 875-125 MG PO TABS: 1.0000 | ORAL_TABLET | Freq: Two times a day (BID) | ORAL | 0 refills | Status: DC

## 2016-03-23 NOTE — Progress Notes (Signed)
Patient ID: [name], male   DOB: 1931-07-23, 83 y.o.   MRN: [id number]  Subjective: [name] is a 83 y.o. male patient seen in office for follow up evaluation of healed ulceration of the left foot, sub-met 1; daughter admits to area opening up last week with pain and swelling; has been using pads with no relief to area. Return to using cam boot which helps. Patient has a history of diabetes and a blood glucose level not recorded.  Patient has no other pedal complaints at this time.  Patient Active Problem List   Diagnosis Date Noted  . Diabetes ([place]) 03/10/2015  . Perennial and seasonal allergic rhinitis 12/20/2014  . Hematuria 12/20/2014  . Loss of weight 05/06/2014  . Other pancytopenia ([place]) 05/05/2014  . CAP (community acquired pneumonia) 05/05/2014  . Hypocalcemia 05/05/2014  . Skin lesion 04/30/2014  . 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 Abdominal pain, right upper quadrant 05/04/2013  . Cold intolerance 05/04/2013  . Low back pain 04/03/2012  . Encounter for long-term (current) use of other medications 12/11/2011  . Routine general medical examination at a health care facility 12/17/2010  . Screening for prostate cancer 12/12/2010  . Osteoarthrosis, unspecified whether generalized or localized, unspecified site 10/24/2010  . SKIN RASH 06/29/2009  . Iron deficiency anemia 03/10/2009  . BACK PAIN, LUMBAR 03/10/2009  . [place] DISEASE 05/03/2008  . DYSPNEA 05/03/2008  . DEPRESSIVE DISORDER NOT ELSEWHERE CLASSIFIED 05/22/2007  . CHEST PAIN 05/22/2007  . ANEMIA, PERNICIOUS 12/02/2006  . HYPERTENSION 07/26/2006  . CORONARY ARTERY DISEASE 07/26/2006  . GERD 07/26/2006   Current Outpatient Prescriptions on File Prior to Visit  Medication Sig Dispense Refill  . Ascorbic Acid (VITAMIN C) 500 MG tablet Take 500 mg by mouth 2 (two) times daily.     Marland Kitchen aspirin (GOODSENSE ASPIRIN) 325 MG tablet Take 325 mg by mouth.    Marland Kitchen azelastine (ASTELIN)  0.1 % nasal spray USE 1-2 SPRAYS IN EACH NOSTRIL TWICE DAILY AS NEEDED 30 mL 5  . Blood Glucose Calibration (ADVOCATE REDI-CODE+ CONTROL) LOW SOLN   3  . Blood Glucose Monitoring Suppl (ADVOCATE REDI-CODE+) DEVI   1  . cadexomer iodine (IODOSORB) 0.9 % gel Apply 1 application topically daily as needed for wound care. 40 g 0  . CLEVER CHEK AUTO-CODE VOICE test strip     . clotrimazole-betamethasone (LOTRISONE) cream Apply topically 2 (two) times daily as needed.     . Cyanocobalamin (VITAMIN B-12 IJ) Inject as directed. GET A B12 INJECTION ONCE EVERY MONTH    . cyanocobalamin 1000 MCG tablet Take 500 mcg by mouth 2 (two) times daily.     . famotidine (PEPCID) 20 MG tablet One at bedtime 30 tablet 2  . Inositol Niacinate (NIACIN FLUSH FREE) 500 MG CAPS Take by mouth.    Elmore Guise Devices (SIMPLE DIAGNOSTICS LANCING DEV) MISC   1  . metFORMIN (GLUCOPHAGE-XR) 500 MG 24 hr tablet Take 2 tablets (1,000 mg total) by mouth daily. 180 tablet 3  . montelukast (SINGULAIR) 10 MG tablet Take 1 tablet (10 mg total) by mouth at bedtime. 90 tablet 3  . Multiple Vitamins-Minerals (MULTIVITAMIN WITH MINERALS) tablet Take 1 tablet by mouth daily.      . nitroGLYCERIN (NITROSTAT) 0.4 MG SL tablet Place 0.4 mg under the tongue every 5 (five) minutes as needed.    . pantoprazole (PROTONIX) 40 MG tablet Take 1 tablet (40 mg total) by mouth daily. Take 30-60 min before first  meal of the day 30 tablet 2  . sertraline (ZOLOFT) 50 MG tablet Take 1 tablet (50 mg total) by mouth daily. 90 tablet 3  . simvastatin (ZOCOR) 80 MG tablet Take 1 tablet (80 mg total) by mouth at bedtime. 90 tablet 3  . traMADol (ULTRAM) 50 MG tablet Take 1 tablet (50 mg total) by mouth every 12 (twelve) hours as needed. 180 tablet 1  . Zinc 50 MG CAPS Take 1 capsule by mouth.       No current facility-administered medications on file prior to visit.    No Known Allergies  Recent Results (from the past 2160 hour(s))  POCT glycosylated  hemoglobin (Hb A1C)     Status: None   Collection Time: 03/05/16 10:31 AM  Result Value Ref Range   Hemoglobin A1C 6.1   IBC panel     Status: None   Collection Time: 03/05/16 10:42 AM  Result Value Ref Range   Iron 104 42 - 165 ug/dL   Transferrin 279.2 334.1 - 360.0 mg/dL   Saturation Ratios 62.2 20.0 - 50.0 %  PSA     Status: None   Collection Time: 03/05/16 10:42 AM  Result Value Ref Range   PSA 2.87 0.10 - 4.00 ng/mL  VITAMIN D 25 Hydroxy (Vit-D Deficiency, Fractures)     Status: None   Collection Time: 03/05/16 10:42 AM  Result Value Ref Range   VITD 63.79 30.00 - 100.00 ng/mL  PTH, intact and calcium     Status: None   Collection Time: 03/05/16 10:42 AM  Result Value Ref Range   PTH 41 14 - 64 pg/mL   Calcium 9.5 8.6 - 10.3 mg/dL    Comment:   Interpretive Guide:                              Intact PTH               Calcium                              ----------               ------- Normal Parathyroid           Normal                   Normal Hypoparathyroidism           Low or Low Normal        Low Hyperparathyroidism      Primary                 Normal or High           High      Secondary               High                     Normal or Low      Tertiary                High                     High Non-Parathyroid   Hypercalcemia              Low or Low Normal        High   Hepatic function panel  Status: None   Collection Time: 03/05/16 10:42 AM  Result Value Ref Range   Total Bilirubin 0.4 0.2 - 1.2 mg/dL   Bilirubin, Direct 0.1 0.0 - 0.3 mg/dL   Alkaline Phosphatase 42 39 - 117 U/L   AST 21 0 - 37 U/L   ALT 17 0 - 53 U/L   Total Protein 6.3 6.0 - 8.3 g/dL   Albumin 4.3 3.5 - 5.2 g/dL  CBC with Differential/Platelet     Status: Abnormal   Collection Time: 03/05/16 10:42 AM  Result Value Ref Range   WBC 5.5 4.0 - 10.5 K/uL   RBC 4.06 (L) 4.22 - 5.81 Mil/uL   Hemoglobin 11.9 (L) 13.0 - 17.0 g/dL   HCT 36.5 (L) 39.0 - 52.0 %   MCV 89.9 78.0 - 100.0  fl   MCHC 32.7 30.0 - 36.0 g/dL   RDW 14.1 11.5 - 15.5 %   Platelets 185.0 150.0 - 400.0 K/uL   Neutrophils Relative % 68.1 43.0 - 77.0 %   Lymphocytes Relative 18.5 12.0 - 46.0 %   Monocytes Relative 10.2 3.0 - 12.0 %   Eosinophils Relative 2.7 0.0 - 5.0 %   Basophils Relative 0.5 0.0 - 3.0 %   Neutro Abs 3.7 1.4 - 7.7 K/uL   Lymphs Abs 1.0 0.7 - 4.0 K/uL   Monocytes Absolute 0.6 0.1 - 1.0 K/uL   Eosinophils Absolute 0.2 0.0 - 0.7 K/uL   Basophils Absolute 0.0 0.0 - 0.1 K/uL  Urinalysis, Routine w reflex microscopic     Status: Abnormal   Collection Time: 03/05/16 10:42 AM  Result Value Ref Range   Color, Urine YELLOW Yellow;Lt. Yellow   APPearance CLEAR Clear   Specific Gravity, Urine 1.010 1.000 - 1.030   pH 6.0 5.0 - 8.0   Total Protein, Urine NEGATIVE Negative   Urine Glucose NEGATIVE Negative   Ketones, ur NEGATIVE Negative   Bilirubin Urine NEGATIVE Negative   Hgb urine dipstick NEGATIVE Negative   Urobilinogen, UA 0.2 0.0 - 1.0   Leukocytes, UA NEGATIVE Negative   Nitrite NEGATIVE Negative   WBC, UA 0-2/hpf 0-2/hpf   RBC / HPF 0-2/hpf 0-2/hpf   Mucus, UA Presence of (A) None   Squamous Epithelial / LPF Rare(0-4/hpf) Rare(0-4/hpf)   Bacteria, UA Rare(<10/hpf) (A) None  Lipid panel     Status: None   Collection Time: 03/05/16 10:42 AM  Result Value Ref Range   Cholesterol 147 0 - 200 mg/dL    Comment: ATP III Classification       Desirable:  < 200 mg/dL               Borderline High:  200 - 239 mg/dL          High:  > = 240 mg/dL   Triglycerides 93.0 0.0 - 149.0 mg/dL    Comment: Normal:  <150 mg/dLBorderline High:  150 - 199 mg/dL   HDL 56.00 >39.00 mg/dL   VLDL 18.6 0.0 - 40.0 mg/dL   LDL Cholesterol 72 0 - 99 mg/dL   Total CHOL/HDL Ratio 3     Comment:                Men          Women1/2 Average Risk     3.4          3.3Average Risk          5.0          4.42X Average Risk  9.6          7.13X Average Risk          15.0          11.0                        NonHDL 90.90     Comment: NOTE:  Non-HDL goal should be 30 mg/dL higher than patient's LDL goal (i.e. LDL goal of < 70 mg/dL, would have non-HDL goal of < 100 mg/dL)  Microalbumin / creatinine urine ratio     Status: Abnormal   Collection Time: 03/05/16 10:42 AM  Result Value Ref Range   Microalb, Ur 3.4 (H) 0.0 - 1.9 mg/dL   Creatinine,U 70.2 mg/dL   Microalb Creat Ratio 4.8 0.0 - 30.0 mg/g  Basic metabolic panel     Status: Abnormal   Collection Time: 03/05/16 10:42 AM  Result Value Ref Range   Sodium 141 135 - 145 mEq/L   Potassium 4.8 3.5 - 5.1 mEq/L   Chloride 106 96 - 112 mEq/L   CO2 27 19 - 32 mEq/L   Glucose, Bld 108 (H) 70 - 99 mg/dL   BUN 21 6 - 23 mg/dL   Creatinine, Ser 0.70 0.40 - 1.50 mg/dL   Calcium 9.5 8.4 - 10.5 mg/dL   GFR 114.67 >60.00 mL/min  Hemoglobin A1c     Status: Abnormal   Collection Time: 03/05/16 10:42 AM  Result Value Ref Range   Hgb A1c MFr Bld 6.6 (H) 4.6 - 6.5 %    Comment: Glycemic Control Guidelines for People with Diabetes:Non Diabetic:  <6%Goal of Therapy: <7%Additional Action Suggested:  >8%   TSH     Status: None   Collection Time: 03/05/16 10:42 AM  Result Value Ref Range   TSH 1.61 0.35 - 4.50 uIU/mL    Objective: There were no vitals filed for this visit.  General: Patient is awake, alert, oriented x 3 and in no acute distress.  Dermatology: Skin is warm and dry bilateral with a partial thickness ulceration Left sub-met 1 measures 1.5x1.5cm with granular base and mild odor,  mild erythema, mild edema, There is mild keratosis at left medial heel with no signs of infection. Nails x 10 mycotic but short. No other acute signs of infection.   Vascular: Dorsalis Pedis pulse = 1/4 Bilateral,  Posterior Tibial pulse = 1/4 Bilateral,  Capillary Fill Time < 5 seconds  Neurologic: Protective sensation absent bilateral using the 5.07/10g Semmes Weinstein Monofilament.  Musculosketal: There is decreased ankle joint range of motion Bilateral.  There is decreased Subtalar joint range of motion Bilateral. There is a decrease in 1st metatarsophalangeal joint range of motion Bilateral. Mild Pain with palpation sub met 1, left foot. No pain with compression to calves bilateral. Hammertoes with Muscle weakness requiring bracing bilateral from Charcot-Marie-Tooth.  Assessment and Plan:  Problem List Items Addressed This Visit      Nervous and Auditory   CHARCOT-MARIE-TOOTH DISEASE   Relevant Medications   gabapentin (NEURONTIN) 300 MG capsule    Other Visit Diagnoses    Foot ulcer, left, limited to breakdown of skin (HCC)    -  Primary   Relevant Medications   amoxicillin-clavulanate (AUGMENTIN) 875-125 MG tablet   Cellulitis and abscess of foot, except toes       Relevant Medications   amoxicillin-clavulanate (AUGMENTIN) 875-125 MG tablet   Type 2 diabetes, controlled, with neuropathy ([place])       Relevant  Medications   gabapentin (NEURONTIN) 300 MG capsule     -Examined patient and discussed the progression of the wound and plan of care. -Debrided Left sub met ulceration to healthy bleeding excisionally and applied silavdene cream and offloading pad to left foot; Patient's daughter to assist with dressing changes at home consisting of the same -Rx Augmentin to call patient if need to change based on wound culture results  -Continue with skin emollients to keratotic heel and dry skin areas -Refilled gabapentin for neuropathy  -Patient to return to office in 1 week for follow up care or sooner if problems arise.    Landis Martins, DPM

## 2016-03-29 ENCOUNTER — Ambulatory Visit (INDEPENDENT_AMBULATORY_CARE_PROVIDER_SITE_OTHER): Payer: Medicare Other | Admitting: Sports Medicine

## 2016-03-29 DIAGNOSIS — L02619 Cutaneous abscess of unspecified foot: Secondary | ICD-10-CM | POA: Diagnosis not present

## 2016-03-29 DIAGNOSIS — L03119 Cellulitis of unspecified part of limb: Secondary | ICD-10-CM | POA: Diagnosis not present

## 2016-03-29 DIAGNOSIS — J9610 Chronic respiratory failure, unspecified whether with hypoxia or hypercapnia: Secondary | ICD-10-CM | POA: Diagnosis not present

## 2016-03-29 DIAGNOSIS — L97521 Non-pressure chronic ulcer of other part of left foot limited to breakdown of skin: Secondary | ICD-10-CM

## 2016-03-29 DIAGNOSIS — E1140 Type 2 diabetes mellitus with diabetic neuropathy, unspecified: Secondary | ICD-10-CM

## 2016-03-29 MED ORDER — CIPROFLOXACIN HCL 500 MG PO TABS: 500.0000 mg | ORAL_TABLET | Freq: Two times a day (BID) | ORAL | 0 refills | Status: DC

## 2016-03-29 MED ORDER — SILVER SULFADIAZINE 1 % EX CREA: 1.0000 "application " | TOPICAL_CREAM | Freq: Every day | CUTANEOUS | 0 refills | Status: DC

## 2016-03-29 NOTE — Progress Notes (Signed)
Patient ID: [name], male   DOB: 1931-08-06, 83 y.o.   MRN: [id number]  Subjective: [name] is a 83 y.o. male patient seen in office for follow up evaluation of left foot, sub-met 1 ulcer; daughter has been changing dressings using silvadene and is using cam boot which helps. Denies any constitutional symptoms. Patient has a history of diabetes and a blood glucose level not recorded.  Patient has no other pedal complaints at this time.  Patient Active Problem List   Diagnosis Date Noted  . Diabetes ([place]) 03/10/2015  . Perennial and seasonal allergic rhinitis 12/20/2014  . Hematuria 12/20/2014  . Loss of weight 05/06/2014  . Other pancytopenia ([place]) 05/05/2014  . CAP (community acquired pneumonia) 05/05/2014  . Hypocalcemia 05/05/2014  . Skin lesion 04/30/2014  . 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 Abdominal pain, right upper quadrant 05/04/2013  . Cold intolerance 05/04/2013  . Low back pain 04/03/2012  . Encounter for long-term (current) use of other medications 12/11/2011  . Routine general medical examination at a health care facility 12/17/2010  . Screening for prostate cancer 12/12/2010  . Osteoarthrosis, unspecified whether generalized or localized, unspecified site 10/24/2010  . SKIN RASH 06/29/2009  . Iron deficiency anemia 03/10/2009  . BACK PAIN, LUMBAR 03/10/2009  . [place] DISEASE 05/03/2008  . DYSPNEA 05/03/2008  . DEPRESSIVE DISORDER NOT ELSEWHERE CLASSIFIED 05/22/2007  . CHEST PAIN 05/22/2007  . ANEMIA, PERNICIOUS 12/02/2006  . HYPERTENSION 07/26/2006  . CORONARY ARTERY DISEASE 07/26/2006  . GERD 07/26/2006   Current Outpatient Prescriptions on File Prior to Visit  Medication Sig Dispense Refill  . amoxicillin-clavulanate (AUGMENTIN) 875-125 MG tablet Take 1 tablet by mouth 2 (two) times daily. 28 tablet 0  . Ascorbic Acid (VITAMIN C) 500 MG tablet Take 500 mg by mouth 2 (two) times daily.     Marland Kitchen aspirin (GOODSENSE  ASPIRIN) 325 MG tablet Take 325 mg by mouth.    Marland Kitchen azelastine (ASTELIN) 0.1 % nasal spray USE 1-2 SPRAYS IN EACH NOSTRIL TWICE DAILY AS NEEDED 30 mL 5  . Blood Glucose Calibration (ADVOCATE REDI-CODE+ CONTROL) LOW SOLN   3  . Blood Glucose Monitoring Suppl (ADVOCATE REDI-CODE+) DEVI   1  . cadexomer iodine (IODOSORB) 0.9 % gel Apply 1 application topically daily as needed for wound care. 40 g 0  . CLEVER CHEK AUTO-CODE VOICE test strip     . clotrimazole-betamethasone (LOTRISONE) cream Apply topically 2 (two) times daily as needed.     . Cyanocobalamin (VITAMIN B-12 IJ) Inject as directed. GET A B12 INJECTION ONCE EVERY MONTH    . cyanocobalamin 1000 MCG tablet Take 500 mcg by mouth 2 (two) times daily.     . famotidine (PEPCID) 20 MG tablet One at bedtime 30 tablet 2  . gabapentin (NEURONTIN) 300 MG capsule Take 2 capsules (600 mg total) by mouth at bedtime. 180 capsule 3  . Inositol Niacinate (NIACIN FLUSH FREE) 500 MG CAPS Take by mouth.    Elmore Guise Devices (SIMPLE DIAGNOSTICS LANCING DEV) MISC   1  . metFORMIN (GLUCOPHAGE-XR) 500 MG 24 hr tablet Take 2 tablets (1,000 mg total) by mouth daily. 180 tablet 3  . montelukast (SINGULAIR) 10 MG tablet Take 1 tablet (10 mg total) by mouth at bedtime. 90 tablet 3  . Multiple Vitamins-Minerals (MULTIVITAMIN WITH MINERALS) tablet Take 1 tablet by mouth daily.      . nitroGLYCERIN (NITROSTAT) 0.4 MG SL tablet Place 0.4 mg under the tongue  every 5 (five) minutes as needed.    . pantoprazole (PROTONIX) 40 MG tablet Take 1 tablet (40 mg total) by mouth daily. Take 30-60 min before first meal of the day 30 tablet 2  . sertraline (ZOLOFT) 50 MG tablet Take 1 tablet (50 mg total) by mouth daily. 90 tablet 3  . simvastatin (ZOCOR) 80 MG tablet Take 1 tablet (80 mg total) by mouth at bedtime. 90 tablet 3  . traMADol (ULTRAM) 50 MG tablet Take 1 tablet (50 mg total) by mouth every 12 (twelve) hours as needed. 180 tablet 1  . Zinc 50 MG CAPS Take 1 capsule by  mouth.       No current facility-administered medications on file prior to visit.    No Known Allergies  Recent Results (from the past 2160 hour(s))  POCT glycosylated hemoglobin (Hb A1C)     Status: None   Collection Time: 03/05/16 10:31 AM  Result Value Ref Range   Hemoglobin A1C 6.1   IBC panel     Status: None   Collection Time: 03/05/16 10:42 AM  Result Value Ref Range   Iron 104 42 - 165 ug/dL   Transferrin 248.0 212.0 - 360.0 mg/dL   Saturation Ratios 30.0 20.0 - 50.0 %  PSA     Status: None   Collection Time: 03/05/16 10:42 AM  Result Value Ref Range   PSA 2.87 0.10 - 4.00 ng/mL  VITAMIN D 25 Hydroxy (Vit-D Deficiency, Fractures)     Status: None   Collection Time: 03/05/16 10:42 AM  Result Value Ref Range   VITD 63.79 30.00 - 100.00 ng/mL  PTH, intact and calcium     Status: None   Collection Time: 03/05/16 10:42 AM  Result Value Ref Range   PTH 41 14 - 64 pg/mL   Calcium 9.5 8.6 - 10.3 mg/dL    Comment:   Interpretive Guide:                              Intact PTH               Calcium                              ----------               ------- Normal Parathyroid           Normal                   Normal Hypoparathyroidism           Low or Low Normal        Low Hyperparathyroidism      Primary                 Normal or High           High      Secondary               High                     Normal or Low      Tertiary                High                     High Non-Parathyroid   Hypercalcemia  Low or Low Normal        High   Hepatic function panel     Status: None   Collection Time: 03/05/16 10:42 AM  Result Value Ref Range   Total Bilirubin 0.4 0.2 - 1.2 mg/dL   Bilirubin, Direct 0.1 0.0 - 0.3 mg/dL   Alkaline Phosphatase 42 39 - 117 U/L   AST 21 0 - 37 U/L   ALT 17 0 - 53 U/L   Total Protein 6.3 6.0 - 8.3 g/dL   Albumin 4.3 3.5 - 5.2 g/dL  CBC with Differential/Platelet     Status: Abnormal   Collection Time: 03/05/16 10:42 AM  Result  Value Ref Range   WBC 5.5 4.0 - 10.5 K/uL   RBC 4.06 (L) 4.22 - 5.81 Mil/uL   Hemoglobin 11.9 (L) 13.0 - 17.0 g/dL   HCT 36.5 (L) 39.0 - 52.0 %   MCV 89.9 78.0 - 100.0 fl   MCHC 32.7 30.0 - 36.0 g/dL   RDW 14.1 11.5 - 15.5 %   Platelets 185.0 150.0 - 400.0 K/uL   Neutrophils Relative % 68.1 43.0 - 77.0 %   Lymphocytes Relative 18.5 12.0 - 46.0 %   Monocytes Relative 10.2 3.0 - 12.0 %   Eosinophils Relative 2.7 0.0 - 5.0 %   Basophils Relative 0.5 0.0 - 3.0 %   Neutro Abs 3.7 1.4 - 7.7 K/uL   Lymphs Abs 1.0 0.7 - 4.0 K/uL   Monocytes Absolute 0.6 0.1 - 1.0 K/uL   Eosinophils Absolute 0.2 0.0 - 0.7 K/uL   Basophils Absolute 0.0 0.0 - 0.1 K/uL  Urinalysis, Routine w reflex microscopic     Status: Abnormal   Collection Time: 03/05/16 10:42 AM  Result Value Ref Range   Color, Urine YELLOW Yellow;Lt. Yellow   APPearance CLEAR Clear   Specific Gravity, Urine 1.010 1.000 - 1.030   pH 6.0 5.0 - 8.0   Total Protein, Urine NEGATIVE Negative   Urine Glucose NEGATIVE Negative   Ketones, ur NEGATIVE Negative   Bilirubin Urine NEGATIVE Negative   Hgb urine dipstick NEGATIVE Negative   Urobilinogen, UA 0.2 0.0 - 1.0   Leukocytes, UA NEGATIVE Negative   Nitrite NEGATIVE Negative   WBC, UA 0-2/hpf 0-2/hpf   RBC / HPF 0-2/hpf 0-2/hpf   Mucus, UA Presence of (A) None   Squamous Epithelial / LPF Rare(0-4/hpf) Rare(0-4/hpf)   Bacteria, UA Rare(<10/hpf) (A) None  Lipid panel     Status: None   Collection Time: 03/05/16 10:42 AM  Result Value Ref Range   Cholesterol 147 0 - 200 mg/dL    Comment: ATP III Classification       Desirable:  < 200 mg/dL               Borderline High:  200 - 239 mg/dL          High:  > = 240 mg/dL   Triglycerides 93.0 0.0 - 149.0 mg/dL    Comment: Normal:  <150 mg/dLBorderline High:  150 - 199 mg/dL   HDL 56.00 >39.00 mg/dL   VLDL 18.6 0.0 - 40.0 mg/dL   LDL Cholesterol 72 0 - 99 mg/dL   Total CHOL/HDL Ratio 3     Comment:                Men          Women1/2  Average Risk     3.4          3.3Average Risk  5.0          4.42X Average Risk          9.6          7.13X Average Risk          15.0          11.0                       NonHDL 90.90     Comment: NOTE:  Non-HDL goal should be 30 mg/dL higher than patient's LDL goal (i.e. LDL goal of < 70 mg/dL, would have non-HDL goal of < 100 mg/dL)  Microalbumin / creatinine urine ratio     Status: Abnormal   Collection Time: 03/05/16 10:42 AM  Result Value Ref Range   Microalb, Ur 3.4 (H) 0.0 - 1.9 mg/dL   Creatinine,U 70.2 mg/dL   Microalb Creat Ratio 4.8 0.0 - 30.0 mg/g  Basic metabolic panel     Status: Abnormal   Collection Time: 03/05/16 10:42 AM  Result Value Ref Range   Sodium 141 135 - 145 mEq/L   Potassium 4.8 3.5 - 5.1 mEq/L   Chloride 106 96 - 112 mEq/L   CO2 27 19 - 32 mEq/L   Glucose, Bld 108 (H) 70 - 99 mg/dL   BUN 21 6 - 23 mg/dL   Creatinine, Ser 0.70 0.40 - 1.50 mg/dL   Calcium 9.5 8.4 - 10.5 mg/dL   GFR 114.67 >60.00 mL/min  Hemoglobin A1c     Status: Abnormal   Collection Time: 03/05/16 10:42 AM  Result Value Ref Range   Hgb A1c MFr Bld 6.6 (H) 4.6 - 6.5 %    Comment: Glycemic Control Guidelines for People with Diabetes:Non Diabetic:  <6%Goal of Therapy: <7%Additional Action Suggested:  >8%   TSH     Status: None   Collection Time: 03/05/16 10:42 AM  Result Value Ref Range   TSH 1.61 0.35 - 4.50 uIU/mL    Objective: There were no vitals filed for this visit.  General: Patient is awake, alert, oriented x 3 and in no acute distress.  Dermatology: Skin is warm and dry bilateral with a partial thickness ulceration Left sub-met 1 measures 1.0x0.5cm with granular base and resolved edema, erythema, and odor, There is mild keratosis at left medial heel with no signs of infection. Nails x 10 mycotic but short. No other acute signs of infection.   Vascular: Dorsalis Pedis pulse = 1/4 Bilateral,  Posterior Tibial pulse = 1/4 Bilateral,  Capillary Fill Time < 5  seconds  Neurologic: Protective sensation absent bilateral using the 5.07/10g Semmes Weinstein Monofilament.  Musculosketal: There is decreased ankle joint range of motion Bilateral. There is decreased Subtalar joint range of motion Bilateral. There is a decrease in 1st metatarsophalangeal joint range of motion Bilateral. Mild Pain with palpation sub met 1, left foot. No pain with compression to calves bilateral. Hammertoes with Muscle weakness requiring bracing bilateral from Charcot-Marie-Tooth.  Assessment and Plan:  Problem List Items Addressed This Visit    None    Visit Diagnoses    Foot ulcer, left, limited to breakdown of skin (HCC)    -  Primary   Relevant Medications   ciprofloxacin (CIPRO) 500 MG tablet   Cellulitis and abscess of foot, except toes       Relevant Medications   ciprofloxacin (CIPRO) 500 MG tablet   Type 2 diabetes, controlled, with neuropathy (HCC)       Relevant Medications  ciprofloxacin (CIPRO) 500 MG tablet     -Examined patient and discussed the progression of the wound and plan of care. -Debrided Left sub met ulceration to healthy bleeding excisionally and applied silavdene cream and offloading pad to left foot; Patient's daughter to assist with dressing changes at home consisting of the same Added Cipro to Augmentin for Enterobacteria on wound culture results  -Continue with skin emollients to keratotic heel and dry skin areas -Continue with gabapentin for neuropathy; may need to increase to tid   -Patient to return to office in 2 weeks for follow up care or sooner if problems arise.    Landis Martins, DPM

## 2016-04-04 ENCOUNTER — Ambulatory Visit: Payer: Medicare Other | Admitting: Sports Medicine

## 2016-04-10 DIAGNOSIS — H2513 Age-related nuclear cataract, bilateral: Secondary | ICD-10-CM | POA: Diagnosis not present

## 2016-04-10 DIAGNOSIS — H02839 Dermatochalasis of unspecified eye, unspecified eyelid: Secondary | ICD-10-CM | POA: Diagnosis not present

## 2016-04-10 DIAGNOSIS — H2511 Age-related nuclear cataract, right eye: Secondary | ICD-10-CM | POA: Diagnosis not present

## 2016-04-10 DIAGNOSIS — H18413 Arcus senilis, bilateral: Secondary | ICD-10-CM | POA: Diagnosis not present

## 2016-04-10 DIAGNOSIS — E119 Type 2 diabetes mellitus without complications: Secondary | ICD-10-CM | POA: Diagnosis not present

## 2016-04-13 ENCOUNTER — Ambulatory Visit (INDEPENDENT_AMBULATORY_CARE_PROVIDER_SITE_OTHER): Payer: Medicare Other | Admitting: Sports Medicine

## 2016-04-13 ENCOUNTER — Encounter: Payer: Self-pay | Admitting: Sports Medicine

## 2016-04-13 DIAGNOSIS — M216X9 Other acquired deformities of unspecified foot: Secondary | ICD-10-CM | POA: Diagnosis not present

## 2016-04-13 DIAGNOSIS — R269 Unspecified abnormalities of gait and mobility: Secondary | ICD-10-CM | POA: Diagnosis not present

## 2016-04-13 DIAGNOSIS — Q667 Congenital pes cavus, unspecified foot: Secondary | ICD-10-CM

## 2016-04-13 DIAGNOSIS — E1140 Type 2 diabetes mellitus with diabetic neuropathy, unspecified: Secondary | ICD-10-CM

## 2016-04-13 DIAGNOSIS — G600 Hereditary motor and sensory neuropathy: Secondary | ICD-10-CM | POA: Diagnosis not present

## 2016-04-13 DIAGNOSIS — L97521 Non-pressure chronic ulcer of other part of left foot limited to breakdown of skin: Secondary | ICD-10-CM | POA: Diagnosis not present

## 2016-04-13 NOTE — Progress Notes (Signed)
Patient ID: [name], male   DOB: 06-24-1931, 83 y.o.   MRN: [id number]  Subjective: [name] is a 83 y.o. male patient seen in office for follow up evaluation of left foot, sub-met 1 ulcer; Patient reports that it is healed up and doing well for the last 3 days has returned back to custom shoe and brace and has not applied anything to the area. Patient has a history of diabetes and a blood glucose level not recorded.  Patient has no other pedal complaints at this time.  Patient is assisted by Dr. at this visit.  Patient Active Problem List   Diagnosis Date Noted  . Diabetes ([place]) 03/10/2015  . Perennial and seasonal allergic rhinitis 12/20/2014  . Hematuria 12/20/2014  . Loss of weight 05/06/2014  . Other pancytopenia ([place]) 05/05/2014  . CAP (community acquired pneumonia) 05/05/2014  . Hypocalcemia 05/05/2014  . Skin lesion 04/30/2014  . 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 Abdominal pain, right upper quadrant 05/04/2013  . Cold intolerance 05/04/2013  . Low back pain 04/03/2012  . Encounter for long-term (current) use of other medications 12/11/2011  . Routine general medical examination at a health care facility 12/17/2010  . Screening for prostate cancer 12/12/2010  . Osteoarthrosis, unspecified whether generalized or localized, unspecified site 10/24/2010  . SKIN RASH 06/29/2009  . Iron deficiency anemia 03/10/2009  . BACK PAIN, LUMBAR 03/10/2009  . [place] DISEASE 05/03/2008  . DYSPNEA 05/03/2008  . DEPRESSIVE DISORDER NOT ELSEWHERE CLASSIFIED 05/22/2007  . CHEST PAIN 05/22/2007  . ANEMIA, PERNICIOUS 12/02/2006  . HYPERTENSION 07/26/2006  . CORONARY ARTERY DISEASE 07/26/2006  . GERD 07/26/2006   Current Outpatient Prescriptions on File Prior to Visit  Medication Sig Dispense Refill  . amoxicillin-clavulanate (AUGMENTIN) 875-125 MG tablet Take 1 tablet by mouth 2 (two) times daily. 28 tablet 0  . Ascorbic Acid (VITAMIN C) 500 MG  tablet Take 500 mg by mouth 2 (two) times daily.     Marland Kitchen aspirin (GOODSENSE ASPIRIN) 325 MG tablet Take 325 mg by mouth.    Marland Kitchen azelastine (ASTELIN) 0.1 % nasal spray USE 1-2 SPRAYS IN EACH NOSTRIL TWICE DAILY AS NEEDED 30 mL 5  . Blood Glucose Calibration (ADVOCATE REDI-CODE+ CONTROL) LOW SOLN   3  . Blood Glucose Monitoring Suppl (ADVOCATE REDI-CODE+) DEVI   1  . cadexomer iodine (IODOSORB) 0.9 % gel Apply 1 application topically daily as needed for wound care. 40 g 0  . ciprofloxacin (CIPRO) 500 MG tablet Take 1 tablet (500 mg total) by mouth 2 (two) times daily. 20 tablet 0  . CLEVER CHEK AUTO-CODE VOICE test strip     . clotrimazole-betamethasone (LOTRISONE) cream Apply topically 2 (two) times daily as needed.     . Cyanocobalamin (VITAMIN B-12 IJ) Inject as directed. GET A B12 INJECTION ONCE EVERY MONTH    . cyanocobalamin 1000 MCG tablet Take 500 mcg by mouth 2 (two) times daily.     . famotidine (PEPCID) 20 MG tablet One at bedtime 30 tablet 2  . gabapentin (NEURONTIN) 300 MG capsule Take 2 capsules (600 mg total) by mouth at bedtime. 180 capsule 3  . Inositol Niacinate (NIACIN FLUSH FREE) 500 MG CAPS Take by mouth.    Elmore Guise Devices (SIMPLE DIAGNOSTICS LANCING DEV) MISC   1  . metFORMIN (GLUCOPHAGE-XR) 500 MG 24 hr tablet Take 2 tablets (1,000 mg total) by mouth daily. 180 tablet 3  . montelukast (SINGULAIR) 10 MG tablet Take  1 tablet (10 mg total) by mouth at bedtime. 90 tablet 3  . Multiple Vitamins-Minerals (MULTIVITAMIN WITH MINERALS) tablet Take 1 tablet by mouth daily.      . nitroGLYCERIN (NITROSTAT) 0.4 MG SL tablet Place 0.4 mg under the tongue every 5 (five) minutes as needed.    . pantoprazole (PROTONIX) 40 MG tablet Take 1 tablet (40 mg total) by mouth daily. Take 30-60 min before first meal of the day 30 tablet 2  . sertraline (ZOLOFT) 50 MG tablet Take 1 tablet (50 mg total) by mouth daily. 90 tablet 3  . silver sulfADIAZINE (SILVADENE) 1 % cream Apply 1 application  topically daily. 50 g 0  . simvastatin (ZOCOR) 80 MG tablet Take 1 tablet (80 mg total) by mouth at bedtime. 90 tablet 3  . traMADol (ULTRAM) 50 MG tablet Take 1 tablet (50 mg total) by mouth every 12 (twelve) hours as needed. 180 tablet 1  . Zinc 50 MG CAPS Take 1 capsule by mouth.       No current facility-administered medications on file prior to visit.    No Known Allergies  Recent Results (from the past 2160 hour(s))  POCT glycosylated hemoglobin (Hb A1C)     Status: None   Collection Time: 03/05/16 10:31 AM  Result Value Ref Range   Hemoglobin A1C 6.1   IBC panel     Status: None   Collection Time: 03/05/16 10:42 AM  Result Value Ref Range   Iron 104 42 - 165 ug/dL   Transferrin 248.0 212.0 - 360.0 mg/dL   Saturation Ratios 30.0 20.0 - 50.0 %  PSA     Status: None   Collection Time: 03/05/16 10:42 AM  Result Value Ref Range   PSA 2.87 0.10 - 4.00 ng/mL  VITAMIN D 25 Hydroxy (Vit-D Deficiency, Fractures)     Status: None   Collection Time: 03/05/16 10:42 AM  Result Value Ref Range   VITD 63.79 30.00 - 100.00 ng/mL  PTH, intact and calcium     Status: None   Collection Time: 03/05/16 10:42 AM  Result Value Ref Range   PTH 41 14 - 64 pg/mL   Calcium 9.5 8.6 - 10.3 mg/dL    Comment:   Interpretive Guide:                              Intact PTH               Calcium                              ----------               ------- Normal Parathyroid           Normal                   Normal Hypoparathyroidism           Low or Low Normal        Low Hyperparathyroidism      Primary                 Normal or High           High      Secondary               High  Normal or Low      Tertiary                High                     High Non-Parathyroid   Hypercalcemia              Low or Low Normal        High   Hepatic function panel     Status: None   Collection Time: 03/05/16 10:42 AM  Result Value Ref Range   Total Bilirubin 0.4 0.2 - 1.2 mg/dL    Bilirubin, Direct 0.1 0.0 - 0.3 mg/dL   Alkaline Phosphatase 42 39 - 117 U/L   AST 21 0 - 37 U/L   ALT 17 0 - 53 U/L   Total Protein 6.3 6.0 - 8.3 g/dL   Albumin 4.3 3.5 - 5.2 g/dL  CBC with Differential/Platelet     Status: Abnormal   Collection Time: 03/05/16 10:42 AM  Result Value Ref Range   WBC 5.5 4.0 - 10.5 K/uL   RBC 4.06 (L) 4.22 - 5.81 Mil/uL   Hemoglobin 11.9 (L) 13.0 - 17.0 g/dL   HCT 36.5 (L) 39.0 - 52.0 %   MCV 89.9 78.0 - 100.0 fl   MCHC 32.7 30.0 - 36.0 g/dL   RDW 14.1 11.5 - 15.5 %   Platelets 185.0 150.0 - 400.0 K/uL   Neutrophils Relative % 68.1 43.0 - 77.0 %   Lymphocytes Relative 18.5 12.0 - 46.0 %   Monocytes Relative 10.2 3.0 - 12.0 %   Eosinophils Relative 2.7 0.0 - 5.0 %   Basophils Relative 0.5 0.0 - 3.0 %   Neutro Abs 3.7 1.4 - 7.7 K/uL   Lymphs Abs 1.0 0.7 - 4.0 K/uL   Monocytes Absolute 0.6 0.1 - 1.0 K/uL   Eosinophils Absolute 0.2 0.0 - 0.7 K/uL   Basophils Absolute 0.0 0.0 - 0.1 K/uL  Urinalysis, Routine w reflex microscopic     Status: Abnormal   Collection Time: 03/05/16 10:42 AM  Result Value Ref Range   Color, Urine YELLOW Yellow;Lt. Yellow   APPearance CLEAR Clear   Specific Gravity, Urine 1.010 1.000 - 1.030   pH 6.0 5.0 - 8.0   Total Protein, Urine NEGATIVE Negative   Urine Glucose NEGATIVE Negative   Ketones, ur NEGATIVE Negative   Bilirubin Urine NEGATIVE Negative   Hgb urine dipstick NEGATIVE Negative   Urobilinogen, UA 0.2 0.0 - 1.0   Leukocytes, UA NEGATIVE Negative   Nitrite NEGATIVE Negative   WBC, UA 0-2/hpf 0-2/hpf   RBC / HPF 0-2/hpf 0-2/hpf   Mucus, UA Presence of (A) None   Squamous Epithelial / LPF Rare(0-4/hpf) Rare(0-4/hpf)   Bacteria, UA Rare(<10/hpf) (A) None  Lipid panel     Status: None   Collection Time: 03/05/16 10:42 AM  Result Value Ref Range   Cholesterol 147 0 - 200 mg/dL    Comment: ATP III Classification       Desirable:  < 200 mg/dL               Borderline High:  200 - 239 mg/dL          High:  > =  240 mg/dL   Triglycerides 93.0 0.0 - 149.0 mg/dL    Comment: Normal:  <150 mg/dLBorderline High:  150 - 199 mg/dL   HDL 56.00 >39.00 mg/dL   VLDL 18.6 0.0 - 40.0 mg/dL  LDL Cholesterol 72 0 - 99 mg/dL   Total CHOL/HDL Ratio 3     Comment:                Men          Women1/2 Average Risk     3.4          3.3Average Risk          5.0          4.42X Average Risk          9.6          7.13X Average Risk          15.0          11.0                       NonHDL 90.90     Comment: NOTE:  Non-HDL goal should be 30 mg/dL higher than patient's LDL goal (i.e. LDL goal of < 70 mg/dL, would have non-HDL goal of < 100 mg/dL)  Microalbumin / creatinine urine ratio     Status: Abnormal   Collection Time: 03/05/16 10:42 AM  Result Value Ref Range   Microalb, Ur 3.4 (H) 0.0 - 1.9 mg/dL   Creatinine,U 70.2 mg/dL   Microalb Creat Ratio 4.8 0.0 - 30.0 mg/g  Basic metabolic panel     Status: Abnormal   Collection Time: 03/05/16 10:42 AM  Result Value Ref Range   Sodium 141 135 - 145 mEq/L   Potassium 4.8 3.5 - 5.1 mEq/L   Chloride 106 96 - 112 mEq/L   CO2 27 19 - 32 mEq/L   Glucose, Bld 108 (H) 70 - 99 mg/dL   BUN 21 6 - 23 mg/dL   Creatinine, Ser 0.70 0.40 - 1.50 mg/dL   Calcium 9.5 8.4 - 10.5 mg/dL   GFR 114.67 >60.00 mL/min  Hemoglobin A1c     Status: Abnormal   Collection Time: 03/05/16 10:42 AM  Result Value Ref Range   Hgb A1c MFr Bld 6.6 (H) 4.6 - 6.5 %    Comment: Glycemic Control Guidelines for People with Diabetes:Non Diabetic:  <6%Goal of Therapy: <7%Additional Action Suggested:  >8%   TSH     Status: None   Collection Time: 03/05/16 10:42 AM  Result Value Ref Range   TSH 1.61 0.35 - 4.50 uIU/mL    Objective: There were no vitals filed for this visit.  General: Patient is awake, alert, oriented x 3 and in no acute distress.  Dermatology: Skin is warm and dry bilateral with a Healed ulceration Left sub-met 1 , with now reactive keratosis that is minimal in nature, no edema, no  erythema, and no odor, There is mild keratosis at left medial heel with no signs of infection. Nails x 10 mycotic but short. No other acute signs of infection.   Vascular: Dorsalis Pedis pulse = 1/4 Bilateral,  Posterior Tibial pulse = 1/4 Bilateral,  Capillary Fill Time < 5 seconds  Neurologic: Protective sensation absent bilateral using the 5.07/10g Semmes Weinstein Monofilament.  Musculosketal: There is decreased ankle joint range of motion Bilateral. There is decreased Subtalar joint range of motion Bilateral. There is a decrease in 1st metatarsophalangeal joint range of motion Bilateral. Pes cavus foot type with prominent first metatarsal head bilateral, left greater than right. No Pain with palpation sub met 1, left foot. No pain with compression to calves bilateral. Hammertoes with Muscle weakness requiring bracing bilateral from Charcot-Marie-Tooth.  Assessment and Plan:  Problem List Items Addressed This Visit      Nervous and Auditory   CHARCOT-MARIE-TOOTH DISEASE    Other Visit Diagnoses    Foot ulcer, left, limited to breakdown of skin ([place])    -  Primary   Healed   Type 2 diabetes, controlled, with neuropathy ([facility])       Pes cavus, congenital       Prominent metatarsal head, unspecified laterality       Abnormality of gait         -Examined patient and discussed the progression of theHeel wound and plan of care. -Applied offloading pad to left foot; Patient's daughter to assist with applying offloading padding daily -Continue with skin emollients to keratotic heel and dry skin areas -Continue with gabapentin for neuropathy; may need to increase to tid as needed -Patient to return to office for healed ulcer check in 1 month and to also see Betha to be evaluated for custom molded offloading insert that will be aggressive enough to offload underneath the first metatarsal phalangeal joints to prevent re-ulceration or sooner if problems arise.  I advised patient to bring new set of  shoes and braces that he has at home to see if any additional modifications need to be made to be shoes and braces that he is not currently wearing.  Landis Martins, DPM

## 2016-04-29 DIAGNOSIS — J9610 Chronic respiratory failure, unspecified whether with hypoxia or hypercapnia: Secondary | ICD-10-CM | POA: Diagnosis not present

## 2016-05-09 ENCOUNTER — Ambulatory Visit (INDEPENDENT_AMBULATORY_CARE_PROVIDER_SITE_OTHER): Payer: Medicare Other | Admitting: Sports Medicine

## 2016-05-09 ENCOUNTER — Encounter: Payer: Self-pay | Admitting: Sports Medicine

## 2016-05-09 ENCOUNTER — Ambulatory Visit (INDEPENDENT_AMBULATORY_CARE_PROVIDER_SITE_OTHER): Payer: Self-pay | Admitting: Sports Medicine

## 2016-05-09 DIAGNOSIS — Q667 Congenital pes cavus, unspecified foot: Secondary | ICD-10-CM

## 2016-05-09 DIAGNOSIS — E1140 Type 2 diabetes mellitus with diabetic neuropathy, unspecified: Secondary | ICD-10-CM

## 2016-05-09 DIAGNOSIS — M216X9 Other acquired deformities of unspecified foot: Secondary | ICD-10-CM

## 2016-05-09 DIAGNOSIS — R269 Unspecified abnormalities of gait and mobility: Secondary | ICD-10-CM | POA: Diagnosis not present

## 2016-05-09 DIAGNOSIS — L97521 Non-pressure chronic ulcer of other part of left foot limited to breakdown of skin: Secondary | ICD-10-CM | POA: Diagnosis not present

## 2016-05-09 DIAGNOSIS — G600 Hereditary motor and sensory neuropathy: Secondary | ICD-10-CM

## 2016-05-09 NOTE — Progress Notes (Signed)
Patient ID: [name], male   DOB: Aug 26, 1931, 83 y.o.   MRN: [id number]  Subjective: [name] is a 83 y.o. male patient seen in office for follow up evaluation of left foot, sub-met 1 ulcer; Patient reports that it is healed up and doing well for the last week has not applied anything to the area not even the offloading pad.  Patient has a history of diabetes and a blood glucose level not recorded.  Patient has no other pedal complaints at this time.  Patient is assisted by friend at this visit and reports that son was suppose to be applying the pad but hasn't come by to help.  Patient Active Problem List   Diagnosis Date Noted  . Diabetes ([place]) 03/10/2015  . Perennial and seasonal allergic rhinitis 12/20/2014  . Hematuria 12/20/2014  . Loss of weight 05/06/2014  . Other pancytopenia ([place]) 05/05/2014  . CAP (community acquired pneumonia) 05/05/2014  . Hypocalcemia 05/05/2014  . Skin lesion 04/30/2014  . 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 Abdominal pain, right upper quadrant 05/04/2013  . Cold intolerance 05/04/2013  . Low back pain 04/03/2012  . Encounter for long-term (current) use of other medications 12/11/2011  . Routine general medical examination at a health care facility 12/17/2010  . Screening for prostate cancer 12/12/2010  . Osteoarthrosis, unspecified whether generalized or localized, unspecified site 10/24/2010  . SKIN RASH 06/29/2009  . Iron deficiency anemia 03/10/2009  . BACK PAIN, LUMBAR 03/10/2009  . [place] DISEASE 05/03/2008  . DYSPNEA 05/03/2008  . DEPRESSIVE DISORDER NOT ELSEWHERE CLASSIFIED 05/22/2007  . CHEST PAIN 05/22/2007  . ANEMIA, PERNICIOUS 12/02/2006  . HYPERTENSION 07/26/2006  . CORONARY ARTERY DISEASE 07/26/2006  . GERD 07/26/2006   Current Outpatient Prescriptions on File Prior to Visit  Medication Sig Dispense Refill  . amoxicillin-clavulanate (AUGMENTIN) 875-125 MG tablet Take 1 tablet by mouth 2  (two) times daily. 28 tablet 0  . Ascorbic Acid (VITAMIN C) 500 MG tablet Take 500 mg by mouth 2 (two) times daily.     Marland Kitchen aspirin (GOODSENSE ASPIRIN) 325 MG tablet Take 325 mg by mouth.    Marland Kitchen azelastine (ASTELIN) 0.1 % nasal spray USE 1-2 SPRAYS IN EACH NOSTRIL TWICE DAILY AS NEEDED 30 mL 5  . Blood Glucose Calibration (ADVOCATE REDI-CODE+ CONTROL) LOW SOLN   3  . Blood Glucose Monitoring Suppl (ADVOCATE REDI-CODE+) DEVI   1  . cadexomer iodine (IODOSORB) 0.9 % gel Apply 1 application topically daily as needed for wound care. 40 g 0  . ciprofloxacin (CIPRO) 500 MG tablet Take 1 tablet (500 mg total) by mouth 2 (two) times daily. 20 tablet 0  . CLEVER CHEK AUTO-CODE VOICE test strip     . clotrimazole-betamethasone (LOTRISONE) cream Apply topically 2 (two) times daily as needed.     . Cyanocobalamin (VITAMIN B-12 IJ) Inject as directed. GET A B12 INJECTION ONCE EVERY MONTH    . cyanocobalamin 1000 MCG tablet Take 500 mcg by mouth 2 (two) times daily.     . famotidine (PEPCID) 20 MG tablet One at bedtime 30 tablet 2  . gabapentin (NEURONTIN) 300 MG capsule Take 2 capsules (600 mg total) by mouth at bedtime. 180 capsule 3  . Inositol Niacinate (NIACIN FLUSH FREE) 500 MG CAPS Take by mouth.    Elmore Guise Devices (SIMPLE DIAGNOSTICS LANCING DEV) MISC   1  . metFORMIN (GLUCOPHAGE-XR) 500 MG 24 hr tablet Take 2 tablets (1,000 mg total) by  mouth daily. 180 tablet 3  . montelukast (SINGULAIR) 10 MG tablet Take 1 tablet (10 mg total) by mouth at bedtime. 90 tablet 3  . Multiple Vitamins-Minerals (MULTIVITAMIN WITH MINERALS) tablet Take 1 tablet by mouth daily.      . nitroGLYCERIN (NITROSTAT) 0.4 MG SL tablet Place 0.4 mg under the tongue every 5 (five) minutes as needed.    . pantoprazole (PROTONIX) 40 MG tablet Take 1 tablet (40 mg total) by mouth daily. Take 30-60 min before first meal of the day 30 tablet 2  . sertraline (ZOLOFT) 50 MG tablet Take 1 tablet (50 mg total) by mouth daily. 90 tablet 3  .  silver sulfADIAZINE (SILVADENE) 1 % cream Apply 1 application topically daily. 50 g 0  . simvastatin (ZOCOR) 80 MG tablet Take 1 tablet (80 mg total) by mouth at bedtime. 90 tablet 3  . traMADol (ULTRAM) 50 MG tablet Take 1 tablet (50 mg total) by mouth every 12 (twelve) hours as needed. 180 tablet 1  . Zinc 50 MG CAPS Take 1 capsule by mouth.       No current facility-administered medications on file prior to visit.    No Known Allergies  Recent Results (from the past 2160 hour(s))  POCT glycosylated hemoglobin (Hb A1C)     Status: None   Collection Time: 03/05/16 10:31 AM  Result Value Ref Range   Hemoglobin A1C 6.1   IBC panel     Status: None   Collection Time: 03/05/16 10:42 AM  Result Value Ref Range   Iron 104 42 - 165 ug/dL   Transferrin 248.0 212.0 - 360.0 mg/dL   Saturation Ratios 30.0 20.0 - 50.0 %  PSA     Status: None   Collection Time: 03/05/16 10:42 AM  Result Value Ref Range   PSA 2.87 0.10 - 4.00 ng/mL  VITAMIN D 25 Hydroxy (Vit-D Deficiency, Fractures)     Status: None   Collection Time: 03/05/16 10:42 AM  Result Value Ref Range   VITD 63.79 30.00 - 100.00 ng/mL  PTH, intact and calcium     Status: None   Collection Time: 03/05/16 10:42 AM  Result Value Ref Range   PTH 41 14 - 64 pg/mL   Calcium 9.5 8.6 - 10.3 mg/dL    Comment:   Interpretive Guide:                              Intact PTH               Calcium                              ----------               ------- Normal Parathyroid           Normal                   Normal Hypoparathyroidism           Low or Low Normal        Low Hyperparathyroidism      Primary                 Normal or High           High      Secondary  High                     Normal or Low      Tertiary                High                     High Non-Parathyroid   Hypercalcemia              Low or Low Normal        High   Hepatic function panel     Status: None   Collection Time: 03/05/16 10:42 AM  Result  Value Ref Range   Total Bilirubin 0.4 0.2 - 1.2 mg/dL   Bilirubin, Direct 0.1 0.0 - 0.3 mg/dL   Alkaline Phosphatase 42 39 - 117 U/L   AST 21 0 - 37 U/L   ALT 17 0 - 53 U/L   Total Protein 6.3 6.0 - 8.3 g/dL   Albumin 4.3 3.5 - 5.2 g/dL  CBC with Differential/Platelet     Status: Abnormal   Collection Time: 03/05/16 10:42 AM  Result Value Ref Range   WBC 5.5 4.0 - 10.5 K/uL   RBC 4.06 (L) 4.22 - 5.81 Mil/uL   Hemoglobin 11.9 (L) 13.0 - 17.0 g/dL   HCT 36.5 (L) 39.0 - 52.0 %   MCV 89.9 78.0 - 100.0 fl   MCHC 32.7 30.0 - 36.0 g/dL   RDW 14.1 11.5 - 15.5 %   Platelets 185.0 150.0 - 400.0 K/uL   Neutrophils Relative % 68.1 43.0 - 77.0 %   Lymphocytes Relative 18.5 12.0 - 46.0 %   Monocytes Relative 10.2 3.0 - 12.0 %   Eosinophils Relative 2.7 0.0 - 5.0 %   Basophils Relative 0.5 0.0 - 3.0 %   Neutro Abs 3.7 1.4 - 7.7 K/uL   Lymphs Abs 1.0 0.7 - 4.0 K/uL   Monocytes Absolute 0.6 0.1 - 1.0 K/uL   Eosinophils Absolute 0.2 0.0 - 0.7 K/uL   Basophils Absolute 0.0 0.0 - 0.1 K/uL  Urinalysis, Routine w reflex microscopic     Status: Abnormal   Collection Time: 03/05/16 10:42 AM  Result Value Ref Range   Color, Urine YELLOW Yellow;Lt. Yellow   APPearance CLEAR Clear   Specific Gravity, Urine 1.010 1.000 - 1.030   pH 6.0 5.0 - 8.0   Total Protein, Urine NEGATIVE Negative   Urine Glucose NEGATIVE Negative   Ketones, ur NEGATIVE Negative   Bilirubin Urine NEGATIVE Negative   Hgb urine dipstick NEGATIVE Negative   Urobilinogen, UA 0.2 0.0 - 1.0   Leukocytes, UA NEGATIVE Negative   Nitrite NEGATIVE Negative   WBC, UA 0-2/hpf 0-2/hpf   RBC / HPF 0-2/hpf 0-2/hpf   Mucus, UA Presence of (A) None   Squamous Epithelial / LPF Rare(0-4/hpf) Rare(0-4/hpf)   Bacteria, UA Rare(<10/hpf) (A) None  Lipid panel     Status: None   Collection Time: 03/05/16 10:42 AM  Result Value Ref Range   Cholesterol 147 0 - 200 mg/dL    Comment: ATP III Classification       Desirable:  < 200 mg/dL                Borderline High:  200 - 239 mg/dL          High:  > = 240 mg/dL   Triglycerides 93.0 0.0 - 149.0 mg/dL    Comment: Normal:  <150 mg/dLBorderline High:  150 - 199 mg/dL   HDL 56.00 >39.00 mg/dL   VLDL 18.6 0.0 - 40.0 mg/dL   LDL Cholesterol 72 0 - 99 mg/dL   Total CHOL/HDL Ratio 3     Comment:                Men          Women1/2 Average Risk     3.4          3.3Average Risk          5.0          4.42X Average Risk          9.6          7.13X Average Risk          15.0          11.0                       NonHDL 90.90     Comment: NOTE:  Non-HDL goal should be 30 mg/dL higher than patient's LDL goal (i.e. LDL goal of < 70 mg/dL, would have non-HDL goal of < 100 mg/dL)  Microalbumin / creatinine urine ratio     Status: Abnormal   Collection Time: 03/05/16 10:42 AM  Result Value Ref Range   Microalb, Ur 3.4 (H) 0.0 - 1.9 mg/dL   Creatinine,U 70.2 mg/dL   Microalb Creat Ratio 4.8 0.0 - 30.0 mg/g  Basic metabolic panel     Status: Abnormal   Collection Time: 03/05/16 10:42 AM  Result Value Ref Range   Sodium 141 135 - 145 mEq/L   Potassium 4.8 3.5 - 5.1 mEq/L   Chloride 106 96 - 112 mEq/L   CO2 27 19 - 32 mEq/L   Glucose, Bld 108 (H) 70 - 99 mg/dL   BUN 21 6 - 23 mg/dL   Creatinine, Ser 0.70 0.40 - 1.50 mg/dL   Calcium 9.5 8.4 - 10.5 mg/dL   GFR 114.67 >60.00 mL/min  Hemoglobin A1c     Status: Abnormal   Collection Time: 03/05/16 10:42 AM  Result Value Ref Range   Hgb A1c MFr Bld 6.6 (H) 4.6 - 6.5 %    Comment: Glycemic Control Guidelines for People with Diabetes:Non Diabetic:  <6%Goal of Therapy: <7%Additional Action Suggested:  >8%   TSH     Status: None   Collection Time: 03/05/16 10:42 AM  Result Value Ref Range   TSH 1.61 0.35 - 4.50 uIU/mL    Objective: There were no vitals filed for this visit.  General: Patient is awake, alert, oriented x 3 and in no acute distress.  Dermatology: Skin is warm and dry bilateral with a Healed ulceration Left sub-met 1 , with now reactive  keratosis that is soft and minimal in nature, no edema, no erythema, and no odor, There is mild keratosis at left medial heel with no signs of infection. Nails x 10 mycotic but short. No other acute signs of infection.   Vascular: Dorsalis Pedis pulse = 1/4 Bilateral,  Posterior Tibial pulse = 1/4 Bilateral,  Capillary Fill Time < 5 seconds  Neurologic: Protective sensation absent bilateral using the 5.07/10g Semmes Weinstein Monofilament.  Musculosketal: There is decreased ankle joint range of motion Bilateral. There is decreased Subtalar joint range of motion Bilateral. There is a decrease in 1st metatarsophalangeal joint range of motion Bilateral. Pes cavus foot type with prominent first metatarsal head bilateral, left greater than right. No Pain with  palpation sub met 1, left foot. No pain with compression to calves bilateral. Hammertoes with Muscle weakness requiring bracing bilateral from Charcot-Marie-Tooth.  Assessment and Plan:  Problem List Items Addressed This Visit      Nervous and Auditory   CHARCOT-MARIE-TOOTH DISEASE    Other Visit Diagnoses    Foot ulcer, left, limited to breakdown of skin ([place])    -  Primary   prematurely healed   Type 2 diabetes, controlled, with neuropathy ([facility])       Pes cavus, congenital       Prominent metatarsal head, unspecified laterality       Abnormality of gait         -Examined patient and discussed the progression of theHeel wound and plan of care. -Applied protective iodosorb dressing since callus was soft and offloading pad to left foot; Patient's son or daughter to assist with applying offloading padding daily -Continue with skin emollients to keratotic heel and dry skin areas -Continue with gabapentin for neuropathy; may need to increase to tid as needed -Patient to return to office for healed ulcer check in 3 weeks. Patient also met with [facility] today for consideration of offloading insole and shoe-brace modification.  Landis Martins,  DPM

## 2016-05-09 NOTE — Progress Notes (Addendum)
Patient met with Benjie Karvonen to be considered for offloading insole for left foot to go inside shoe that is attached to brace. Patient to return for foam box impression to offload problem area -Dr. Cannon Kettle

## 2016-05-16 ENCOUNTER — Other Ambulatory Visit: Payer: Medicare Other

## 2016-05-28 DIAGNOSIS — H2513 Age-related nuclear cataract, bilateral: Secondary | ICD-10-CM | POA: Diagnosis not present

## 2016-05-28 DIAGNOSIS — H2511 Age-related nuclear cataract, right eye: Secondary | ICD-10-CM | POA: Diagnosis not present

## 2016-05-29 DIAGNOSIS — H2512 Age-related nuclear cataract, left eye: Secondary | ICD-10-CM | POA: Diagnosis not present

## 2016-05-29 DIAGNOSIS — J9610 Chronic respiratory failure, unspecified whether with hypoxia or hypercapnia: Secondary | ICD-10-CM | POA: Diagnosis not present

## 2016-05-30 ENCOUNTER — Ambulatory Visit: Payer: Medicare Other | Admitting: Sports Medicine

## 2016-06-14 DIAGNOSIS — Z7982 Long term (current) use of aspirin: Secondary | ICD-10-CM | POA: Diagnosis not present

## 2016-06-14 DIAGNOSIS — R0689 Other abnormalities of breathing: Secondary | ICD-10-CM | POA: Diagnosis not present

## 2016-06-14 DIAGNOSIS — I25810 Atherosclerosis of coronary artery bypass graft(s) without angina pectoris: Secondary | ICD-10-CM | POA: Diagnosis not present

## 2016-06-14 DIAGNOSIS — E119 Type 2 diabetes mellitus without complications: Secondary | ICD-10-CM | POA: Diagnosis not present

## 2016-06-18 DIAGNOSIS — H2511 Age-related nuclear cataract, right eye: Secondary | ICD-10-CM | POA: Diagnosis not present

## 2016-06-18 DIAGNOSIS — H2513 Age-related nuclear cataract, bilateral: Secondary | ICD-10-CM | POA: Diagnosis not present

## 2016-06-18 DIAGNOSIS — H2512 Age-related nuclear cataract, left eye: Secondary | ICD-10-CM | POA: Diagnosis not present

## 2016-06-22 ENCOUNTER — Encounter: Payer: Self-pay | Admitting: Sports Medicine

## 2016-06-22 ENCOUNTER — Ambulatory Visit (INDEPENDENT_AMBULATORY_CARE_PROVIDER_SITE_OTHER): Payer: Medicare Other | Admitting: Sports Medicine

## 2016-06-22 VITALS — BP 132/67 | HR 58 | Temp 96.6°F | Resp 18

## 2016-06-22 DIAGNOSIS — G600 Hereditary motor and sensory neuropathy: Secondary | ICD-10-CM

## 2016-06-22 DIAGNOSIS — L97521 Non-pressure chronic ulcer of other part of left foot limited to breakdown of skin: Secondary | ICD-10-CM

## 2016-06-22 DIAGNOSIS — R609 Edema, unspecified: Secondary | ICD-10-CM

## 2016-06-22 DIAGNOSIS — Q667 Congenital pes cavus, unspecified foot: Secondary | ICD-10-CM

## 2016-06-22 DIAGNOSIS — E1140 Type 2 diabetes mellitus with diabetic neuropathy, unspecified: Secondary | ICD-10-CM

## 2016-06-22 DIAGNOSIS — M216X9 Other acquired deformities of unspecified foot: Secondary | ICD-10-CM

## 2016-06-22 DIAGNOSIS — R269 Unspecified abnormalities of gait and mobility: Secondary | ICD-10-CM

## 2016-06-22 MED ORDER — METHYLPREDNISOLONE 4 MG PO TBPK: ORAL_TABLET | ORAL | 0 refills | Status: DC

## 2016-06-22 NOTE — Progress Notes (Signed)
Patient ID: [name], male   DOB: Nov 20, 1931, 83 y.o.   MRN: [id number]  Subjective: [name] is a 83 y.o. male patient seen in office for follow up evaluation of left foot, sub-met 1 ulcer/callus; Patient reports that it has been doing well with no drainage, however, his left leg is swollen and he has missed several appointments because he had his cataracts done. Patient has a history of diabetes and a blood glucose level not recorded.  Patient has no other pedal complaints at this time.  Patient is assisted by his daughter who reports that she has been helping him with the offloading padding and has made him wear his walking cam boot to help keep the pressure off since his foot is more swollen and difficult to figure normal shoe.  Patient Active Problem List   Diagnosis Date Noted  . Diabetes ([place]) 03/10/2015  . Perennial and seasonal allergic rhinitis 12/20/2014  . Hematuria 12/20/2014  . Loss of weight 05/06/2014  . Other pancytopenia ([place]) 05/05/2014  . CAP (community acquired pneumonia) 05/05/2014  . Hypocalcemia 05/05/2014  . Skin lesion 04/30/2014  . 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 Abdominal pain, right upper quadrant 05/04/2013  . Cold intolerance 05/04/2013  . Low back pain 04/03/2012  . Encounter for long-term (current) use of other medications 12/11/2011  . Routine general medical examination at a health care facility 12/17/2010  . Screening for prostate cancer 12/12/2010  . Osteoarthrosis, unspecified whether generalized or localized, unspecified site 10/24/2010  . SKIN RASH 06/29/2009  . Iron deficiency anemia 03/10/2009  . BACK PAIN, LUMBAR 03/10/2009  . [place] DISEASE 05/03/2008  . DYSPNEA 05/03/2008  . DEPRESSIVE DISORDER NOT ELSEWHERE CLASSIFIED 05/22/2007  . CHEST PAIN 05/22/2007  . ANEMIA, PERNICIOUS 12/02/2006  . HYPERTENSION 07/26/2006  . CORONARY ARTERY DISEASE 07/26/2006  . GERD 07/26/2006   Current  Outpatient Prescriptions on File Prior to Visit  Medication Sig Dispense Refill  . amoxicillin-clavulanate (AUGMENTIN) 875-125 MG tablet Take 1 tablet by mouth 2 (two) times daily. 28 tablet 0  . Ascorbic Acid (VITAMIN C) 500 MG tablet Take 500 mg by mouth 2 (two) times daily.     Marland Kitchen aspirin (GOODSENSE ASPIRIN) 325 MG tablet Take 325 mg by mouth.    Marland Kitchen azelastine (ASTELIN) 0.1 % nasal spray USE 1-2 SPRAYS IN EACH NOSTRIL TWICE DAILY AS NEEDED 30 mL 5  . Blood Glucose Calibration (ADVOCATE REDI-CODE+ CONTROL) LOW SOLN   3  . Blood Glucose Monitoring Suppl (ADVOCATE REDI-CODE+) DEVI   1  . cadexomer iodine (IODOSORB) 0.9 % gel Apply 1 application topically daily as needed for wound care. 40 g 0  . ciprofloxacin (CIPRO) 500 MG tablet Take 1 tablet (500 mg total) by mouth 2 (two) times daily. 20 tablet 0  . CLEVER CHEK AUTO-CODE VOICE test strip     . clotrimazole-betamethasone (LOTRISONE) cream Apply topically 2 (two) times daily as needed.     . Cyanocobalamin (VITAMIN B-12 IJ) Inject as directed. GET A B12 INJECTION ONCE EVERY MONTH    . cyanocobalamin 1000 MCG tablet Take 500 mcg by mouth 2 (two) times daily.     . famotidine (PEPCID) 20 MG tablet One at bedtime 30 tablet 2  . gabapentin (NEURONTIN) 300 MG capsule Take 2 capsules (600 mg total) by mouth at bedtime. 180 capsule 3  . Inositol Niacinate (NIACIN FLUSH FREE) 500 MG CAPS Take by mouth.    Elmore Guise Devices (SIMPLE DIAGNOSTICS  LANCING DEV) MISC   1  . metFORMIN (GLUCOPHAGE-XR) 500 MG 24 hr tablet Take 2 tablets (1,000 mg total) by mouth daily. 180 tablet 3  . montelukast (SINGULAIR) 10 MG tablet Take 1 tablet (10 mg total) by mouth at bedtime. 90 tablet 3  . Multiple Vitamins-Minerals (MULTIVITAMIN WITH MINERALS) tablet Take 1 tablet by mouth daily.      . nitroGLYCERIN (NITROSTAT) 0.4 MG SL tablet Place 0.4 mg under the tongue every 5 (five) minutes as needed.    . pantoprazole (PROTONIX) 40 MG tablet Take 1 tablet (40 mg total) by  mouth daily. Take 30-60 min before first meal of the day 30 tablet 2  . sertraline (ZOLOFT) 50 MG tablet Take 1 tablet (50 mg total) by mouth daily. 90 tablet 3  . silver sulfADIAZINE (SILVADENE) 1 % cream Apply 1 application topically daily. 50 g 0  . simvastatin (ZOCOR) 80 MG tablet Take 1 tablet (80 mg total) by mouth at bedtime. 90 tablet 3  . traMADol (ULTRAM) 50 MG tablet Take 1 tablet (50 mg total) by mouth every 12 (twelve) hours as needed. 180 tablet 1  . Zinc 50 MG CAPS Take 1 capsule by mouth.       No current facility-administered medications on file prior to visit.    No Known Allergies  No results found for this or any previous visit (from the past 2160 hour(s)).  Objective: There were no vitals filed for this visit.  General: Patient is awake, alert, oriented x 3 and in no acute distress.  Dermatology: Skin is warm and dry bilateral with a Recurrent ulceration Left sub-met 1 , that measures 1 cm x 0.3 cm  in width with very minimal depth with with reactive keratosis that is soft and minimal in nature, 1+ pitting left greater than right, no erythema, and no odor, There is mild keratosis at left medial heel with no signs of infection. Nails x 10 mycotic but short. No other acute signs of infection.   Vascular: Dorsalis Pedis pulse = 1/4 Bilateral,  Posterior Tibial pulse = 1/4 Bilateral,  Capillary Fill Time < 5 seconds  Neurologic: Protective sensation absent bilateral using the 5.07/10g Semmes Weinstein Monofilament.  Musculosketal: There is decreased ankle joint range of motion Bilateral. There is decreased Subtalar joint range of motion Bilateral. There is a decrease in 1st metatarsophalangeal joint range of motion Bilateral. Pes cavus foot type with prominent first metatarsal head bilateral, left greater than right. No Pain with palpation sub met 1, left foot, however. Subjective burning sensation. No pain with compression to calves bilateral. Hammertoes with Muscle weakness  requiring bracing bilateral from Charcot-Marie-Tooth.  Assessment and Plan:  Problem List Items Addressed This Visit      Nervous and Auditory   CHARCOT-MARIE-TOOTH DISEASE    Other Visit Diagnoses    Foot ulcer, left, limited to breakdown of skin ([place])    -  Primary   Type 2 diabetes, controlled, with neuropathy (HCC)       Swelling       Relevant Medications   methylPREDNISolone (MEDROL DOSEPAK) 4 MG TBPK tablet   Pes cavus, congenital       Prominent metatarsal head, unspecified laterality       Abnormality of gait         -Examined patient and discussed the progression of Recurrent wound -Mechanically debrided ulceration left sub-met one to healthy bleeding margins. Then applied offloading pad and iodosorb dressing sto left foot; Patient's daughter to assist  with dressing changes -Prescribed Medrol Dosepak for swelling, pain and inflammation -Continue with skin emollients to keratotic heel and dry skin areas -Continue with gabapentin for neuropathy; may need to increase to tid as needed -Patient to return to office for ulcer check in 2 weeks. Patient to also be molded for custom insoles for his shoes next visit if swelling is resolved. However, if swelling is still present. Will repeat x-ray and ordered blood work including a d-dimer.   Landis Martins, DPM

## 2016-06-29 DIAGNOSIS — J9610 Chronic respiratory failure, unspecified whether with hypoxia or hypercapnia: Secondary | ICD-10-CM | POA: Diagnosis not present

## 2016-07-06 ENCOUNTER — Ambulatory Visit (INDEPENDENT_AMBULATORY_CARE_PROVIDER_SITE_OTHER): Payer: Medicare Other | Admitting: Sports Medicine

## 2016-07-06 DIAGNOSIS — Q667 Congenital pes cavus, unspecified foot: Secondary | ICD-10-CM

## 2016-07-06 DIAGNOSIS — R609 Edema, unspecified: Secondary | ICD-10-CM

## 2016-07-06 DIAGNOSIS — E1140 Type 2 diabetes mellitus with diabetic neuropathy, unspecified: Secondary | ICD-10-CM | POA: Diagnosis not present

## 2016-07-06 DIAGNOSIS — R269 Unspecified abnormalities of gait and mobility: Secondary | ICD-10-CM | POA: Diagnosis not present

## 2016-07-06 DIAGNOSIS — M216X9 Other acquired deformities of unspecified foot: Secondary | ICD-10-CM

## 2016-07-06 DIAGNOSIS — L97521 Non-pressure chronic ulcer of other part of left foot limited to breakdown of skin: Secondary | ICD-10-CM | POA: Diagnosis not present

## 2016-07-06 DIAGNOSIS — G600 Hereditary motor and sensory neuropathy: Secondary | ICD-10-CM

## 2016-07-06 NOTE — Progress Notes (Signed)
Patient ID: [name], male   DOB: 11/17/1931, 83 y.o.   MRN: [id number]  Subjective: [name] is a 83 y.o. male patient seen in office for follow up evaluation of left foot, sub-met 1 ulcer; Patient reports that the swelling is better and that there has been no drainage from the ulceration on his left foot. Patient has a history of diabetes and a blood glucose level not recorded.  Patient has no other pedal complaints at this time.  Patient is assisted by his daughter who assists with wound care, has been using iodosorb and offloading padding.   Patient Active Problem List   Diagnosis Date Noted  . Diabetes ([place]) 03/10/2015  . Perennial and seasonal allergic rhinitis 12/20/2014  . Hematuria 12/20/2014  . Loss of weight 05/06/2014  . Other pancytopenia ([place]) 05/05/2014  . CAP (community acquired pneumonia) 05/05/2014  . Hypocalcemia 05/05/2014  . Skin lesion 04/30/2014  . 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 Abdominal pain, right upper quadrant 05/04/2013  . Cold intolerance 05/04/2013  . Low back pain 04/03/2012  . Encounter for long-term (current) use of other medications 12/11/2011  . Routine general medical examination at a health care facility 12/17/2010  . Screening for prostate cancer 12/12/2010  . Osteoarthrosis, unspecified whether generalized or localized, unspecified site 10/24/2010  . SKIN RASH 06/29/2009  . Iron deficiency anemia 03/10/2009  . BACK PAIN, LUMBAR 03/10/2009  . [place] DISEASE 05/03/2008  . DYSPNEA 05/03/2008  . DEPRESSIVE DISORDER NOT ELSEWHERE CLASSIFIED 05/22/2007  . CHEST PAIN 05/22/2007  . ANEMIA, PERNICIOUS 12/02/2006  . HYPERTENSION 07/26/2006  . CORONARY ARTERY DISEASE 07/26/2006  . GERD 07/26/2006   Current Outpatient Prescriptions on File Prior to Visit  Medication Sig Dispense Refill  . amoxicillin-clavulanate (AUGMENTIN) 875-125 MG tablet Take 1 tablet by mouth 2 (two) times daily. 28 tablet 0  .  Ascorbic Acid (VITAMIN C) 500 MG tablet Take 500 mg by mouth 2 (two) times daily.     Marland Kitchen aspirin (GOODSENSE ASPIRIN) 325 MG tablet Take 325 mg by mouth.    Marland Kitchen azelastine (ASTELIN) 0.1 % nasal spray USE 1-2 SPRAYS IN EACH NOSTRIL TWICE DAILY AS NEEDED 30 mL 5  . Blood Glucose Calibration (ADVOCATE REDI-CODE+ CONTROL) LOW SOLN   3  . Blood Glucose Monitoring Suppl (ADVOCATE REDI-CODE+) DEVI   1  . cadexomer iodine (IODOSORB) 0.9 % gel Apply 1 application topically daily as needed for wound care. 40 g 0  . ciprofloxacin (CIPRO) 500 MG tablet Take 1 tablet (500 mg total) by mouth 2 (two) times daily. 20 tablet 0  . CLEVER CHEK AUTO-CODE VOICE test strip     . clotrimazole-betamethasone (LOTRISONE) cream Apply topically 2 (two) times daily as needed.     . Cyanocobalamin (VITAMIN B-12 IJ) Inject as directed. GET A B12 INJECTION ONCE EVERY MONTH    . cyanocobalamin 1000 MCG tablet Take 500 mcg by mouth 2 (two) times daily.     . famotidine (PEPCID) 20 MG tablet One at bedtime 30 tablet 2  . gabapentin (NEURONTIN) 300 MG capsule Take 2 capsules (600 mg total) by mouth at bedtime. 180 capsule 3  . Inositol Niacinate (NIACIN FLUSH FREE) 500 MG CAPS Take by mouth.    Elmore Guise Devices (SIMPLE DIAGNOSTICS LANCING DEV) MISC   1  . metFORMIN (GLUCOPHAGE-XR) 500 MG 24 hr tablet Take 2 tablets (1,000 mg total) by mouth daily. 180 tablet 3  . methylPREDNISolone (MEDROL DOSEPAK) 4 MG  TBPK tablet Take as instructed 21 tablet 0  . montelukast (SINGULAIR) 10 MG tablet Take 1 tablet (10 mg total) by mouth at bedtime. 90 tablet 3  . Multiple Vitamins-Minerals (MULTIVITAMIN WITH MINERALS) tablet Take 1 tablet by mouth daily.      . nitroGLYCERIN (NITROSTAT) 0.4 MG SL tablet Place 0.4 mg under the tongue every 5 (five) minutes as needed.    . pantoprazole (PROTONIX) 40 MG tablet Take 1 tablet (40 mg total) by mouth daily. Take 30-60 min before first meal of the day 30 tablet 2  . sertraline (ZOLOFT) 50 MG tablet Take 1  tablet (50 mg total) by mouth daily. 90 tablet 3  . silver sulfADIAZINE (SILVADENE) 1 % cream Apply 1 application topically daily. 50 g 0  . simvastatin (ZOCOR) 80 MG tablet Take 1 tablet (80 mg total) by mouth at bedtime. 90 tablet 3  . traMADol (ULTRAM) 50 MG tablet Take 1 tablet (50 mg total) by mouth every 12 (twelve) hours as needed. 180 tablet 1  . Zinc 50 MG CAPS Take 1 capsule by mouth.       No current facility-administered medications on file prior to visit.    No Known Allergies  No results found for this or any previous visit (from the past 2160 hour(s)).  Objective: There were no vitals filed for this visit.  General: Patient is awake, alert, oriented x 3 and in no acute distress.  Dermatology: Skin is warm and dry bilateral with a ulceration Left sub-met, that measures 0.5 cm x 0.3 cm  in width with very minimal depth with with reactive keratosis that is soft and minimal in nature,trace edema to left greater than right, no erythema, and no odor, There is mild keratosis at left medial heel with no signs of infection. Nails x 10 mycotic but short. No other acute signs of infection.   Vascular: Dorsalis Pedis pulse = 1/4 Bilateral,  Posterior Tibial pulse = 1/4 Bilateral,  Capillary Fill Time < 5 seconds  Neurologic: Protective sensation absent bilateral using the 5.07/10g Semmes Weinstein Monofilament.  Musculosketal: There is decreased ankle joint range of motion Bilateral. There is decreased Subtalar joint range of motion Bilateral. There is a decrease in 1st metatarsophalangeal joint range of motion Bilateral. Pes cavus foot type with prominent first metatarsal head bilateral, left greater than right. No Pain with palpation sub met 1, left foot, however continued subjective burning sensation. No pain with compression to calves bilateral. Hammertoes with Muscle weakness requiring bracing bilateral from Charcot-Marie-Tooth.  Assessment and Plan:  Problem List Items Addressed  This Visit      Nervous and Auditory   CHARCOT-MARIE-TOOTH DISEASE    Other Visit Diagnoses    Type 2 diabetes, controlled, with neuropathy ([place])    -  Primary   Foot ulcer, left, limited to breakdown of skin (HCC)       Pes cavus, congenital       Prominent metatarsal head, unspecified laterality       Abnormality of gait       Swelling         -Examined patient and discussed the progression of Recurrent wound -Mechanically debrided ulceration left sub-met one to healthy bleeding margins. Then applied offloading pad and iodosorb dressing to left foot; Patient's daughter to assist with dressing changes consisting of the same -Continue with CAM boot -Foam impression were obtained of both feet for a custom set of insoles with aggressive sub met 1 offloading; Rx and impressions were  sent to [facility] -Continue with skin emollients to keratotic heel and dry skin areas -Continue with gabapentin for neuropathy; may need to increase to tid as needed -Patient to return to office for ulcer check and PUO when called or sooner if problems or issues arise.  Landis Martins, DPM

## 2016-07-29 DIAGNOSIS — J9610 Chronic respiratory failure, unspecified whether with hypoxia or hypercapnia: Secondary | ICD-10-CM | POA: Diagnosis not present

## 2016-08-08 ENCOUNTER — Ambulatory Visit (INDEPENDENT_AMBULATORY_CARE_PROVIDER_SITE_OTHER): Payer: Medicare Other | Admitting: Sports Medicine

## 2016-08-08 DIAGNOSIS — Q667 Congenital pes cavus, unspecified foot: Secondary | ICD-10-CM

## 2016-08-08 DIAGNOSIS — E1140 Type 2 diabetes mellitus with diabetic neuropathy, unspecified: Secondary | ICD-10-CM

## 2016-08-08 DIAGNOSIS — I82401 Acute embolism and thrombosis of unspecified deep veins of right lower extremity: Secondary | ICD-10-CM

## 2016-08-08 DIAGNOSIS — L97521 Non-pressure chronic ulcer of other part of left foot limited to breakdown of skin: Secondary | ICD-10-CM | POA: Diagnosis not present

## 2016-08-08 DIAGNOSIS — L03115 Cellulitis of right lower limb: Secondary | ICD-10-CM

## 2016-08-08 DIAGNOSIS — R600 Localized edema: Secondary | ICD-10-CM | POA: Diagnosis not present

## 2016-08-08 DIAGNOSIS — G600 Hereditary motor and sensory neuropathy: Secondary | ICD-10-CM

## 2016-08-08 NOTE — Progress Notes (Signed)
Patient ID: [name], male   DOB: Aug 18, 1931, 83 y.o.   MRN: [id number]  Subjective: [name] is a 83 y.o. male patient seen in office for follow up evaluation of left [name], sub-met 1 ulcer; Patient reports that the swelling has significant swelling in the right greater than left leg with redness and a new blister that has popped up on the left great toe. Patient is a poor historian and doesn't know when all of these changes have started it. Patient is assisted by daughter who states that that had a fall one week ago. Twice during that week; Patient has a history of diabetes and a blood glucose level not recorded.  Patient has no other pedal complaints at this time.  Patient is assisted by his daughter who assists with wound care, has been using iodosorb, but ran out of it a few days ago  and offloading padding.   Patient Active Problem List   Diagnosis Date Noted  . Diabetes ([place]) 03/10/2015  . Perennial and seasonal allergic rhinitis 12/20/2014  . Hematuria 12/20/2014  . Loss of weight 05/06/2014  . Other pancytopenia ([place]) 05/05/2014  . CAP (community acquired pneumonia) 05/05/2014  . Hypocalcemia 05/05/2014  . Skin lesion 04/30/2014  . 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 Abdominal pain, right upper quadrant 05/04/2013  . Cold intolerance 05/04/2013  . Low back pain 04/03/2012  . Encounter for long-term (current) use of other medications 12/11/2011  . Routine general medical examination at a health care facility 12/17/2010  . Screening for prostate cancer 12/12/2010  . Osteoarthrosis, unspecified whether generalized or localized, unspecified site 10/24/2010  . SKIN RASH 06/29/2009  . Iron deficiency anemia 03/10/2009  . BACK PAIN, LUMBAR 03/10/2009  . [place] DISEASE 05/03/2008  . DYSPNEA 05/03/2008  . DEPRESSIVE DISORDER NOT ELSEWHERE CLASSIFIED 05/22/2007  . CHEST PAIN 05/22/2007  . ANEMIA, PERNICIOUS 12/02/2006  . HYPERTENSION  07/26/2006  . CORONARY ARTERY DISEASE 07/26/2006  . GERD 07/26/2006   Current Outpatient Prescriptions on File Prior to Visit  Medication Sig Dispense Refill  . amoxicillin-clavulanate (AUGMENTIN) 875-125 MG tablet Take 1 tablet by mouth 2 (two) times daily. 28 tablet 0  . Ascorbic Acid (VITAMIN C) 500 MG tablet Take 500 mg by mouth 2 (two) times daily.     Marland Kitchen aspirin (GOODSENSE ASPIRIN) 325 MG tablet Take 325 mg by mouth.    Marland Kitchen azelastine (ASTELIN) 0.1 % nasal spray USE 1-2 SPRAYS IN EACH NOSTRIL TWICE DAILY AS NEEDED 30 mL 5  . Blood Glucose Calibration (ADVOCATE REDI-CODE+ CONTROL) LOW SOLN   3  . Blood Glucose Monitoring Suppl (ADVOCATE REDI-CODE+) DEVI   1  . cadexomer iodine (IODOSORB) 0.9 % gel Apply 1 application topically daily as needed for wound care. 40 g 0  . ciprofloxacin (CIPRO) 500 MG tablet Take 1 tablet (500 mg total) by mouth 2 (two) times daily. 20 tablet 0  . CLEVER CHEK AUTO-CODE VOICE test strip     . clotrimazole-betamethasone (LOTRISONE) cream Apply topically 2 (two) times daily as needed.     . Cyanocobalamin (VITAMIN B-12 IJ) Inject as directed. GET A B12 INJECTION ONCE EVERY MONTH    . cyanocobalamin 1000 MCG tablet Take 500 mcg by mouth 2 (two) times daily.     . famotidine (PEPCID) 20 MG tablet One at bedtime 30 tablet 2  . gabapentin (NEURONTIN) 300 MG capsule Take 2 capsules (600 mg total) by mouth at bedtime. 180 capsule 3  .  Inositol Niacinate (NIACIN FLUSH FREE) 500 MG CAPS Take by mouth.    Elmore Guise Devices (SIMPLE DIAGNOSTICS LANCING DEV) MISC   1  . metFORMIN (GLUCOPHAGE-XR) 500 MG 24 hr tablet Take 2 tablets (1,000 mg total) by mouth daily. 180 tablet 3  . methylPREDNISolone (MEDROL DOSEPAK) 4 MG TBPK tablet Take as instructed 21 tablet 0  . montelukast (SINGULAIR) 10 MG tablet Take 1 tablet (10 mg total) by mouth at bedtime. 90 tablet 3  . Multiple Vitamins-Minerals (MULTIVITAMIN WITH MINERALS) tablet Take 1 tablet by mouth daily.      . nitroGLYCERIN  (NITROSTAT) 0.4 MG SL tablet Place 0.4 mg under the tongue every 5 (five) minutes as needed.    . pantoprazole (PROTONIX) 40 MG tablet Take 1 tablet (40 mg total) by mouth daily. Take 30-60 min before first meal of the day 30 tablet 2  . sertraline (ZOLOFT) 50 MG tablet Take 1 tablet (50 mg total) by mouth daily. 90 tablet 3  . silver sulfADIAZINE (SILVADENE) 1 % cream Apply 1 application topically daily. 50 g 0  . simvastatin (ZOCOR) 80 MG tablet Take 1 tablet (80 mg total) by mouth at bedtime. 90 tablet 3  . traMADol (ULTRAM) 50 MG tablet Take 1 tablet (50 mg total) by mouth every 12 (twelve) hours as needed. 180 tablet 1  . Zinc 50 MG CAPS Take 1 capsule by mouth.       No current facility-administered medications on file prior to visit.    No Known Allergies  No results found for this or any previous visit (from the past 2160 hour(s)).  Objective: There were no vitals filed for this visit.  General: Patient is awake, alert, oriented x 3 and in no acute distress in electric wheelchair.  Dermatology: Skin is warm and dry bilateral withDry ruptured blister at the left great toe with no acute signs of infection and a ulceration Left sub-met, that measures 1.5 cm x 0.8 x0.2 cm with  a granular base with reactive keratosis that is soft and minimal in nature,trace edema  on left, however, there is significant 1+ pitting edema on right with warmth and redness and multiple superficial abrasions, noactive drainage, no odor, There is mild keratosis at left medial heel with no signs of infection. Nails x 10 mycotic but short.    Vascular: Dorsalis Pedis pulse = 1/4 Bilateral,  Posterior Tibial pulse = 1/4 Bilateral,  Capillary Fill Time < 5 seconds  Neurologic: Protective sensation absent bilateral using the 5.07/10g Semmes Weinstein Monofilament.  Musculosketal: No pain with palpation to ulcerated area. Pes cavus [name] type with prominent first metatarsal head bilateral, left greater than right.  There is mild tenderness to right calf with warmth and swelling. Hammertoes with Muscle weakness requiring bracing bilateral from Charcot-Marie-Tooth.  Assessment and Plan:  Problem List Items Addressed This Visit      Nervous and Auditory   CHARCOT-MARIE-TOOTH DISEASE    Other Visit Diagnoses    Cellulitis of right leg    -  Primary   Acute deep vein thrombosis (DVT) of right lower extremity, unspecified vein (HCC)       [name] ulcer, left, limited to breakdown of skin (HCC)       Pes cavus, congenital       Type 2 diabetes, controlled, with neuropathy ([place])         -Examined patient and discussed the progression of Recurrent wound -Mechanically debrided ulceration left sub-met one to healthy bleeding margins. Then applied offloading  pad and PRISMA dressing to left [name]; Patient's daughter to assist with dressing changes consisting of the same -No dressing is needed at the left first toe since blister is resolved -Called to [facility] and spoke with attending physician and advised patient to go to ER for cellulitis on right versus DVT -Patient is awaiting custom insoles from [facility]  -Continue with skin emollients to keratotic heel and dry skin areas -Continue with gabapentin for neuropathy -Patient to return to office for ulcer check and PUO when called or sooner if problems or issues arise.If patient is admitted to [facility]. I will follow him there while inpatient.   Landis Martins, DPM

## 2016-08-17 DIAGNOSIS — M79602 Pain in left arm: Secondary | ICD-10-CM | POA: Diagnosis not present

## 2016-08-27 DIAGNOSIS — M25512 Pain in left shoulder: Secondary | ICD-10-CM | POA: Diagnosis not present

## 2016-08-27 DIAGNOSIS — S42255A Nondisplaced fracture of greater tuberosity of left humerus, initial encounter for closed fracture: Secondary | ICD-10-CM | POA: Diagnosis not present

## 2016-08-29 DIAGNOSIS — J9610 Chronic respiratory failure, unspecified whether with hypoxia or hypercapnia: Secondary | ICD-10-CM | POA: Diagnosis not present

## 2016-08-30 ENCOUNTER — Encounter (INDEPENDENT_AMBULATORY_CARE_PROVIDER_SITE_OTHER): Payer: Self-pay

## 2016-08-30 ENCOUNTER — Ambulatory Visit (INDEPENDENT_AMBULATORY_CARE_PROVIDER_SITE_OTHER): Payer: Medicare Other | Admitting: Sports Medicine

## 2016-08-30 ENCOUNTER — Ambulatory Visit: Payer: Medicare Other | Admitting: Orthotics

## 2016-08-30 DIAGNOSIS — I82401 Acute embolism and thrombosis of unspecified deep veins of right lower extremity: Secondary | ICD-10-CM

## 2016-08-30 DIAGNOSIS — G600 Hereditary motor and sensory neuropathy: Secondary | ICD-10-CM

## 2016-08-30 DIAGNOSIS — M216X9 Other acquired deformities of unspecified foot: Secondary | ICD-10-CM

## 2016-08-30 DIAGNOSIS — Q667 Congenital pes cavus, unspecified foot: Secondary | ICD-10-CM

## 2016-08-30 DIAGNOSIS — L97521 Non-pressure chronic ulcer of other part of left foot limited to breakdown of skin: Secondary | ICD-10-CM

## 2016-08-30 DIAGNOSIS — R609 Edema, unspecified: Secondary | ICD-10-CM

## 2016-08-30 DIAGNOSIS — L03115 Cellulitis of right lower limb: Secondary | ICD-10-CM

## 2016-08-30 DIAGNOSIS — E1140 Type 2 diabetes mellitus with diabetic neuropathy, unspecified: Secondary | ICD-10-CM

## 2016-08-30 DIAGNOSIS — R269 Unspecified abnormalities of gait and mobility: Secondary | ICD-10-CM

## 2016-08-30 NOTE — Progress Notes (Signed)
Patient ID: NISAIAH BECHTOL, male   DOB: 16-May-1931, 83 y.o.   MRN: [id number]  Subjective: AVEDIS BEVIS is a 83 y.o. male patient seen in office for follow up evaluation of left foot, sub-met 1 ulcer; Patient reports that the swelling is better and that the ulceration has been dry with minimal drainage.  Patient has no other pedal complaints at this time.  Patient is assisted by his daughter who assists with wound care, has been using dancer pad and PRISMA.   Patient is also here to see Liliane Channel for revision of orthotics.   Patient Active Problem List   Diagnosis Date Noted  . Diabetes ([place]) 03/10/2015  . Perennial and seasonal allergic rhinitis 12/20/2014  . Hematuria 12/20/2014  . Loss of weight 05/06/2014  . Other pancytopenia ([place]) 05/05/2014  . CAP (community acquired pneumonia) 05/05/2014  . Hypocalcemia 05/05/2014  . Skin lesion 04/30/2014  . 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 Abdominal pain, right upper quadrant 05/04/2013  . Cold intolerance 05/04/2013  . Low back pain 04/03/2012  . Encounter for long-term (current) use of other medications 12/11/2011  . Routine general medical examination at a health care facility 12/17/2010  . Screening for prostate cancer 12/12/2010  . Osteoarthrosis, unspecified whether generalized or localized, unspecified site 10/24/2010  . SKIN RASH 06/29/2009  . Iron deficiency anemia 03/10/2009  . BACK PAIN, LUMBAR 03/10/2009  . [place] DISEASE 05/03/2008  . DYSPNEA 05/03/2008  . DEPRESSIVE DISORDER NOT ELSEWHERE CLASSIFIED 05/22/2007  . CHEST PAIN 05/22/2007  . ANEMIA, PERNICIOUS 12/02/2006  . HYPERTENSION 07/26/2006  . CORONARY ARTERY DISEASE 07/26/2006  . GERD 07/26/2006   Current Outpatient Prescriptions on File Prior to Visit  Medication Sig Dispense Refill  . amoxicillin-clavulanate (AUGMENTIN) 875-125 MG tablet Take 1 tablet by mouth 2 (two) times daily. 28 tablet 0  . Ascorbic Acid (VITAMIN C) 500 MG  tablet Take 500 mg by mouth 2 (two) times daily.     Marland Kitchen aspirin (GOODSENSE ASPIRIN) 325 MG tablet Take 325 mg by mouth.    Marland Kitchen azelastine (ASTELIN) 0.1 % nasal spray USE 1-2 SPRAYS IN EACH NOSTRIL TWICE DAILY AS NEEDED 30 mL 5  . Blood Glucose Calibration (ADVOCATE REDI-CODE+ CONTROL) LOW SOLN   3  . Blood Glucose Monitoring Suppl (ADVOCATE REDI-CODE+) DEVI   1  . cadexomer iodine (IODOSORB) 0.9 % gel Apply 1 application topically daily as needed for wound care. 40 g 0  . ciprofloxacin (CIPRO) 500 MG tablet Take 1 tablet (500 mg total) by mouth 2 (two) times daily. 20 tablet 0  . CLEVER CHEK AUTO-CODE VOICE test strip     . clotrimazole-betamethasone (LOTRISONE) cream Apply topically 2 (two) times daily as needed.     . Cyanocobalamin (VITAMIN B-12 IJ) Inject as directed. GET A B12 INJECTION ONCE EVERY MONTH    . cyanocobalamin 1000 MCG tablet Take 500 mcg by mouth 2 (two) times daily.     . famotidine (PEPCID) 20 MG tablet One at bedtime 30 tablet 2  . gabapentin (NEURONTIN) 300 MG capsule Take 2 capsules (600 mg total) by mouth at bedtime. 180 capsule 3  . Inositol Niacinate (NIACIN FLUSH FREE) 500 MG CAPS Take by mouth.    Elmore Guise Devices (SIMPLE DIAGNOSTICS LANCING DEV) MISC   1  . metFORMIN (GLUCOPHAGE-XR) 500 MG 24 hr tablet Take 2 tablets (1,000 mg total) by mouth daily. 180 tablet 3  . methylPREDNISolone (MEDROL DOSEPAK) 4 MG TBPK tablet Take as  instructed 21 tablet 0  . montelukast (SINGULAIR) 10 MG tablet Take 1 tablet (10 mg total) by mouth at bedtime. 90 tablet 3  . Multiple Vitamins-Minerals (MULTIVITAMIN WITH MINERALS) tablet Take 1 tablet by mouth daily.      . nitroGLYCERIN (NITROSTAT) 0.4 MG SL tablet Place 0.4 mg under the tongue every 5 (five) minutes as needed.    . pantoprazole (PROTONIX) 40 MG tablet Take 1 tablet (40 mg total) by mouth daily. Take 30-60 min before first meal of the day 30 tablet 2  . sertraline (ZOLOFT) 50 MG tablet Take 1 tablet (50 mg total) by mouth  daily. 90 tablet 3  . silver sulfADIAZINE (SILVADENE) 1 % cream Apply 1 application topically daily. 50 g 0  . simvastatin (ZOCOR) 80 MG tablet Take 1 tablet (80 mg total) by mouth at bedtime. 90 tablet 3  . traMADol (ULTRAM) 50 MG tablet Take 1 tablet (50 mg total) by mouth every 12 (twelve) hours as needed. 180 tablet 1  . Zinc 50 MG CAPS Take 1 capsule by mouth.       No current facility-administered medications on file prior to visit.    No Known Allergies  No results found for this or any previous visit (from the past 2160 hour(s)).  Objective: There were no vitals filed for this visit.  General: Patient is awake, alert, oriented x 3 and in no acute distress in electric wheelchair.  Dermatology: Skin is warm and dry bilateral with a ulceration Left sub-met, that measures 0.5 cm x 0.5 x0.2 cm with  a granular base with reactive keratosis that is soft and minimal in nature,trace edema bilateral, no cellulitis, no odor, There is mild keratosis at left medial heel with no signs of infection. Nails x 10 mycotic but short.    Vascular: Dorsalis Pedis pulse = 1/4 Bilateral,  Posterior Tibial pulse = 1/4 Bilateral,  Capillary Fill Time < 5 seconds  Neurologic: Protective sensation absent bilateral using the 5.07/10g Semmes Weinstein Monofilament.  Musculosketal: No pain with palpation to ulcerated area. Pes cavus foot type with prominent first metatarsal head bilateral, left greater than right. Hammertoes with Muscle weakness requiring bracing bilateral from Charcot-Marie-Tooth.  Assessment and Plan:  Problem List Items Addressed This Visit      Nervous and Auditory   CHARCOT-MARIE-TOOTH DISEASE    Other Visit Diagnoses    Cellulitis of right leg    -  Primary   Acute deep vein thrombosis (DVT) of right lower extremity, unspecified vein (HCC)       Foot ulcer, left, limited to breakdown of skin (HCC)       Pes cavus, congenital       Type 2 diabetes, controlled, with neuropathy (HCC)        Prominent metatarsal head, unspecified laterality       Abnormality of gait       Swelling         -Examined patient and discussed the progression of Recurrent wound -Mechanically debrided ulceration left sub-met one to healthy bleeding margins. Then applied offloading pad and PRISMA dressing to left foot; Patient's daughter to assist with dressing changes consisting of the same -Patient met with Liliane Channel regarding offloading orthotics and custom shoes -Continue with skin emollients to keratotic heel and dry skin areas -Continue with gabapentin for neuropathy -Patient to return to office for ulcer check in 3 weeks and PUO.  Landis Martins, DPM

## 2016-08-30 NOTE — Progress Notes (Signed)
Orthotics to be sent back to [facility] for correction:  Arch needs to be higher (way too low) and further offload of 1st met head.

## 2016-09-05 ENCOUNTER — Ambulatory Visit (INDEPENDENT_AMBULATORY_CARE_PROVIDER_SITE_OTHER): Payer: Medicare Other | Admitting: Endocrinology

## 2016-09-05 ENCOUNTER — Encounter: Payer: Self-pay | Admitting: Endocrinology

## 2016-09-05 VITALS — BP 122/72 | HR 60 | Wt 194.0 lb

## 2016-09-05 DIAGNOSIS — E1159 Type 2 diabetes mellitus with other circulatory complications: Secondary | ICD-10-CM | POA: Diagnosis not present

## 2016-09-05 DIAGNOSIS — E538 Deficiency of other specified B group vitamins: Secondary | ICD-10-CM

## 2016-09-05 LAB — POCT GLYCOSYLATED HEMOGLOBIN (HGB A1C): HEMOGLOBIN A1C: 6.2

## 2016-09-05 MED ORDER — CYANOCOBALAMIN 1000 MCG/ML IJ SOLN
1000.0000 ug | Freq: Once | INTRAMUSCULAR | Status: AC
  Administered 2016-09-05: 1000 ug via INTRAMUSCULAR

## 2016-09-05 NOTE — Progress Notes (Signed)
we discussed code status.  pt requests DNR 

## 2016-09-05 NOTE — Progress Notes (Signed)
Subjective:    Patient ID: [name], male    DOB: Dec 15, 1931, 83 y.o.   MRN: [id number]  HPI Pt returns for f/u of diabetes mellitus: DM type: 2 Dx'ed: 2008 Complications: CAD and foot ulcer.  Therapy: metformin.  DKA: never.  Severe hypoglycemia: never.  Pancreatitis: never.  Other: he has never been on insulin.   Interval history: no cbg record, but states cbg's are well-controlled.  pt states he feels well in general, except recent fx left shoulder.  Past Medical History:  Diagnosis Date  . ANEMIA, IRON DEFICIENCY   . BACK PAIN, LUMBAR   . Charcot-Marie-Tooth disease    assoiciated muscular dystrophy   . COPD (chronic obstructive pulmonary disease) (HCC)   . Coronary artery disease   . DIABETES MELLITUS, TYPE II   . GERD (gastroesophageal reflux disease)   . H/O: asbestos exposure   . Hypertension   . Leukopenia   . Pernicious anemia   . Smoker    quit 1968--25 ppy    Past Surgical History:  Procedure Laterality Date  . CORONARY ARTERY BYPASS GRAFT  1995    Social History   Social History  . Marital status: Widowed    Spouse name: N/A  . Number of children: N/A  . Years of education: N/A   Occupational History  . Retired Retired    Games developer   Social History Main Topics  . Smoking status: Former Smoker    Packs/day: 1.00    Years: 20.00    Types: Cigarettes    Quit date: 01/01/1966  . Smokeless tobacco: Never Used  . Alcohol use No  . Drug use: No  . Sexual activity: Not on file   Other Topics Concern  . Not on file   Social History Narrative   Was in service during [place] War.  Lives alone in a one story home.  On disability.            Current Outpatient Prescriptions on File Prior to Visit  Medication Sig Dispense Refill  . amoxicillin-clavulanate (AUGMENTIN) 875-125 MG tablet Take 1 tablet by mouth 2 (two) times daily. 28 tablet 0  . Ascorbic Acid (VITAMIN C) 500 MG tablet Take 500 mg by mouth 2 (two) times daily.     Marland Kitchen  aspirin (GOODSENSE ASPIRIN) 325 MG tablet Take 325 mg by mouth.    Marland Kitchen azelastine (ASTELIN) 0.1 % nasal spray USE 1-2 SPRAYS IN EACH NOSTRIL TWICE DAILY AS NEEDED 30 mL 5  . Blood Glucose Calibration (ADVOCATE REDI-CODE+ CONTROL) LOW SOLN   3  . Blood Glucose Monitoring Suppl (ADVOCATE REDI-CODE+) DEVI   1  . cadexomer iodine (IODOSORB) 0.9 % gel Apply 1 application topically daily as needed for wound care. 40 g 0  . CLEVER CHEK AUTO-CODE VOICE test strip     . clotrimazole-betamethasone (LOTRISONE) cream Apply topically 2 (two) times daily as needed.     . Cyanocobalamin (VITAMIN B-12 IJ) Inject as directed. GET A B12 INJECTION ONCE EVERY MONTH    . cyanocobalamin 1000 MCG tablet Take 500 mcg by mouth 2 (two) times daily.     . famotidine (PEPCID) 20 MG tablet One at bedtime 30 tablet 2  . gabapentin (NEURONTIN) 300 MG capsule Take 2 capsules (600 mg total) by mouth at bedtime. 180 capsule 3  . Inositol Niacinate (NIACIN FLUSH FREE) 500 MG CAPS Take by mouth.    Demetra Shiner Devices (SIMPLE DIAGNOSTICS LANCING DEV) MISC   1  . metFORMIN (  GLUCOPHAGE-XR) 500 MG 24 hr tablet Take 2 tablets (1,000 mg total) by mouth daily. 180 tablet 3  . montelukast (SINGULAIR) 10 MG tablet Take 1 tablet (10 mg total) by mouth at bedtime. 90 tablet 3  . Multiple Vitamins-Minerals (MULTIVITAMIN WITH MINERALS) tablet Take 1 tablet by mouth daily.      . nitroGLYCERIN (NITROSTAT) 0.4 MG SL tablet Place 0.4 mg under the tongue every 5 (five) minutes as needed.    . pantoprazole (PROTONIX) 40 MG tablet Take 1 tablet (40 mg total) by mouth daily. Take 30-60 min before first meal of the day 30 tablet 2  . sertraline (ZOLOFT) 50 MG tablet Take 1 tablet (50 mg total) by mouth daily. 90 tablet 3  . silver sulfADIAZINE (SILVADENE) 1 % cream Apply 1 application topically daily. 50 g 0  . simvastatin (ZOCOR) 80 MG tablet Take 1 tablet (80 mg total) by mouth at bedtime. 90 tablet 3  . traMADol (ULTRAM) 50 MG tablet Take 1 tablet (50  mg total) by mouth every 12 (twelve) hours as needed. 180 tablet 1  . Zinc 50 MG CAPS Take 1 capsule by mouth.       No current facility-administered medications on file prior to visit.     No Known Allergies  Family History  Problem Relation Age of Onset  . Charcot-Marie-Tooth disease Sister   . Charcot-Marie-Tooth disease Brother   . Charcot-Marie-Tooth disease Brother   . Charcot-Marie-Tooth disease Brother   . Charcot-Marie-Tooth disease Sister   . Charcot-Marie-Tooth disease Son   . Charcot-Marie-Tooth disease Other   . COPD Daughter        never smoker    BP 122/72   Pulse 60   Wt 194 lb (88 kg)   SpO2 96%   BMI 27.84 kg/m   Review of Systems Denies weight change.     Objective:   Physical Exam VITAL SIGNS:  See vs page.  GENERAL: no distress.   Pulses: foot pulses are intact bilaterally.   MSK: no deformity of the feet or ankles.  CV: no edema of the legs or ankles Skin: ulcer on the left foot is bandaged (sees podiatry) .  normal color and temp on the feet and ankles. Old healed surgical scar at the right leg (vein harvest)  Neuro: sensation is intact to touch on the feet and ankles, but decreased from normal.   Ext: There is bilateral onychomycosis of the toenails.   Lab Results  Component Value Date   HGBA1C 6.2 09/05/2016       Assessment & Plan:  Type 2 DM: well-controlled CMT disease: we discussed inability to exercise, so medication and diet are best rxs  Subjective:   Patient here for Medicare annual wellness visit and management of other chronic and acute problems.     Risk factors: advanced age    Roster of Physicians Providing Medical Care to Patient:  See "snapshot"   Activities of Daily Living: In your present state of health, do you have any difficulty performing the following activities (lives alone, here with dtr today)?:  Preparing food and eating?: No  Bathing yourself: No  Getting dressed: No  Using the toilet:No  Moving  around from place to place: No  In the past year have you fallen or had a near fall?:No    Home Safety: Has smoke detector and wears seat belts.  Firearms are safely stored. No excess sun exposure.    Diet and Exercise  Current exercise habits: limited  by health probs Dietary issues discussed: pt reports a healthy diet   Depression Screen  Q1: Over the past two weeks, have you felt down, depressed or hopeless? no  Q2: Over the past two weeks, have you felt little interest or pleasure in doing things? no   The following portions of the patient's history were reviewed and updated as appropriate: allergies, current medications, past family history, past medical history, past social history, past surgical history and problem list.   Review of Systems  Denies change in chronic hearing loss (declines HA); denies visual loss Objective:   Vision:  [place] opthalmologist, so he declines [facility] today.  Hearing: grossly normal Body mass index:  See vs page Msk: pt easily and slowly performs "get-up-and-go" from a sitting position Cognitive Impairment Assessment: cognition, memory and judgment appear normal.  remembers 3/3 at 5 minutes.  excellent recall.  can easily read and write a sentence.  alert and oriented x 3   Assessment:   Medicare wellness utd on preventive parameters    Plan:   During the course of the visit the patient was educated and counseled about appropriate screening and preventive services including:        Fall prevention.    Diabetes screening. Nutrition counseling   Vaccines are updated as needed  Patient Instructions (the written plan) was given to the patient.

## 2016-09-05 NOTE — Patient Instructions (Addendum)
Please consider these measures for your health:  minimize alcohol.  Do not use tobacco products.  Have a colonoscopy at least every 10 years from age 83.  Keep firearms safely stored.  Always use seat belts.  have working smoke alarms in your home.  See an eye doctor and dentist regularly.  Never drive under the influence of alcohol or drugs (including prescription drugs).  Those with fair skin should take precautions against the sun, and should carefully examine their skin once per month, for any new or changed moles. It is critically important to prevent falling down (keep floor areas well-lit, dry, and free of loose objects.  If you have a cane, walker, or wheelchair, you should use it, even for short trips around the house.  Wear flat-soled shoes.  Also, try not to rush).   Please come back for a regular physical appointment in 6 months (must be after 03/06/17).

## 2016-09-11 DIAGNOSIS — S42255D Nondisplaced fracture of greater tuberosity of left humerus, subsequent encounter for fracture with routine healing: Secondary | ICD-10-CM | POA: Diagnosis not present

## 2016-09-21 ENCOUNTER — Ambulatory Visit (INDEPENDENT_AMBULATORY_CARE_PROVIDER_SITE_OTHER): Payer: Medicare Other | Admitting: Sports Medicine

## 2016-09-21 DIAGNOSIS — L97521 Non-pressure chronic ulcer of other part of left foot limited to breakdown of skin: Secondary | ICD-10-CM

## 2016-09-21 DIAGNOSIS — M216X9 Other acquired deformities of unspecified foot: Secondary | ICD-10-CM

## 2016-09-21 DIAGNOSIS — R269 Unspecified abnormalities of gait and mobility: Secondary | ICD-10-CM

## 2016-09-21 DIAGNOSIS — G600 Hereditary motor and sensory neuropathy: Secondary | ICD-10-CM

## 2016-09-21 DIAGNOSIS — E1140 Type 2 diabetes mellitus with diabetic neuropathy, unspecified: Secondary | ICD-10-CM

## 2016-09-21 DIAGNOSIS — Q667 Congenital pes cavus, unspecified foot: Secondary | ICD-10-CM

## 2016-09-22 NOTE — Progress Notes (Signed)
Patient ID: [name], male   DOB: 1931/05/05, 83 y.o.   MRN: [id number]  Subjective: [name] is a 83 y.o. male patient seen in office for follow up evaluation of left foot, sub-met 1 ulcer; Patient reports that the swelling is better and that the ulceration has been dry.  Patient has no other pedal complaints at this time.  Patient is assisted by his daughter who assists with wound care, has been using dancer pad and PRISMA with improvement.    Patient Active Problem List   Diagnosis Date Noted  . Diabetes ([place]) 03/10/2015  . Perennial and seasonal allergic rhinitis 12/20/2014  . Hematuria 12/20/2014  . Loss of weight 05/06/2014  . Other pancytopenia ([place]) 05/05/2014  . CAP (community acquired pneumonia) 05/05/2014  . Hypocalcemia 05/05/2014  . Skin lesion 04/30/2014  . 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 Abdominal pain, right upper quadrant 05/04/2013  . Cold intolerance 05/04/2013  . Low back pain 04/03/2012  . Encounter for long-term (current) use of other medications 12/11/2011  . Routine general medical examination at a health care facility 12/17/2010  . Screening for prostate cancer 12/12/2010  . Osteoarthrosis, unspecified whether generalized or localized, unspecified site 10/24/2010  . SKIN RASH 06/29/2009  . Iron deficiency anemia 03/10/2009  . BACK PAIN, LUMBAR 03/10/2009  . [place] DISEASE 05/03/2008  . DYSPNEA 05/03/2008  . DEPRESSIVE DISORDER NOT ELSEWHERE CLASSIFIED 05/22/2007  . CHEST PAIN 05/22/2007  . ANEMIA, PERNICIOUS 12/02/2006  . HYPERTENSION 07/26/2006  . CORONARY ARTERY DISEASE 07/26/2006  . GERD 07/26/2006   Current Outpatient Prescriptions on File Prior to Visit  Medication Sig Dispense Refill  . amoxicillin-clavulanate (AUGMENTIN) 875-125 MG tablet Take 1 tablet by mouth 2 (two) times daily. 28 tablet 0  . Ascorbic Acid (VITAMIN C) 500 MG tablet Take 500 mg by mouth 2 (two) times daily.     Marland Kitchen aspirin  (GOODSENSE ASPIRIN) 325 MG tablet Take 325 mg by mouth.    Marland Kitchen azelastine (ASTELIN) 0.1 % nasal spray USE 1-2 SPRAYS IN EACH NOSTRIL TWICE DAILY AS NEEDED 30 mL 5  . Blood Glucose Calibration (ADVOCATE REDI-CODE+ CONTROL) LOW SOLN   3  . Blood Glucose Monitoring Suppl (ADVOCATE REDI-CODE+) DEVI   1  . cadexomer iodine (IODOSORB) 0.9 % gel Apply 1 application topically daily as needed for wound care. 40 g 0  . CLEVER CHEK AUTO-CODE VOICE test strip     . clotrimazole-betamethasone (LOTRISONE) cream Apply topically 2 (two) times daily as needed.     . Cyanocobalamin (VITAMIN B-12 IJ) Inject as directed. GET A B12 INJECTION ONCE EVERY MONTH    . cyanocobalamin 1000 MCG tablet Take 500 mcg by mouth 2 (two) times daily.     . famotidine (PEPCID) 20 MG tablet One at bedtime 30 tablet 2  . gabapentin (NEURONTIN) 300 MG capsule Take 2 capsules (600 mg total) by mouth at bedtime. 180 capsule 3  . Inositol Niacinate (NIACIN FLUSH FREE) 500 MG CAPS Take by mouth.    Elmore Guise Devices (SIMPLE DIAGNOSTICS LANCING DEV) MISC   1  . metFORMIN (GLUCOPHAGE-XR) 500 MG 24 hr tablet Take 2 tablets (1,000 mg total) by mouth daily. 180 tablet 3  . montelukast (SINGULAIR) 10 MG tablet Take 1 tablet (10 mg total) by mouth at bedtime. 90 tablet 3  . Multiple Vitamins-Minerals (MULTIVITAMIN WITH MINERALS) tablet Take 1 tablet by mouth daily.      . nitroGLYCERIN (NITROSTAT) 0.4 MG SL tablet  Place 0.4 mg under the tongue every 5 (five) minutes as needed.    . pantoprazole (PROTONIX) 40 MG tablet Take 1 tablet (40 mg total) by mouth daily. Take 30-60 min before first meal of the day 30 tablet 2  . sertraline (ZOLOFT) 50 MG tablet Take 1 tablet (50 mg total) by mouth daily. 90 tablet 3  . silver sulfADIAZINE (SILVADENE) 1 % cream Apply 1 application topically daily. 50 g 0  . simvastatin (ZOCOR) 80 MG tablet Take 1 tablet (80 mg total) by mouth at bedtime. 90 tablet 3  . traMADol (ULTRAM) 50 MG tablet Take 1 tablet (50 mg  total) by mouth every 12 (twelve) hours as needed. 180 tablet 1  . Zinc 50 MG CAPS Take 1 capsule by mouth.       No current facility-administered medications on file prior to visit.    No Known Allergies  Recent Results (from the past 2160 hour(s))  POCT HgB A1C     Status: None   Collection Time: 09/05/16 10:41 AM  Result Value Ref Range   Hemoglobin A1C 6.2     Objective: There were no vitals filed for this visit.  General: Patient is awake, alert, oriented x 3 and in no acute distress in electric wheelchair.  Dermatology: Skin is warm and dry bilateral with a ulceration Left sub-met, that measures 0.3 cm x 0.2 x0.2 cm with  a granular base with reactive keratosis that is soft and minimal in nature,trace edema bilateral, no cellulitis, no odor, There is mild keratosis at left medial heel with no signs of infection. Nails x 10 mycotic but short.    Vascular: Dorsalis Pedis pulse = 1/4 Bilateral,  Posterior Tibial pulse = 1/4 Bilateral,  Capillary Fill Time < 5 seconds  Neurologic: Protective sensation absent bilateral using the 5.07/10g Semmes Weinstein Monofilament.  Musculosketal: No pain with palpation to ulcerated area. Pes cavus foot type with prominent first metatarsal head bilateral, left greater than right. Hammertoes with Muscle weakness requiring bracing bilateral from Charcot-Marie-Tooth.  Assessment and Plan:  Problem List Items Addressed This Visit      Nervous and Auditory   CHARCOT-MARIE-TOOTH DISEASE    Other Visit Diagnoses    Foot ulcer, left, limited to breakdown of skin (HCC)    -  Primary   Pes cavus, congenital       Type 2 diabetes, controlled, with neuropathy (HCC)       Prominent metatarsal head, unspecified laterality       Abnormality of gait         -Examined patient and discussed the progression of Recurrent wound -Mechanically debrided ulceration left sub-met one to healthy bleeding margins. Then applied offloading pad and PRISMA dressing to  left foot; Patient's daughter to assist with dressing changes consisting of the same -Orthoics were sent back to [facility]; reports that he decided to hold off on it for now/cancel the order -Continue with custom shoes and braces  -Continue with skin emollients to keratotic heel and dry skin areas -Continue with gabapentin for neuropathy -Patient to return to office for ulcer check in 3 weeks.  Landis Martins, DPM

## 2016-09-29 DIAGNOSIS — J9610 Chronic respiratory failure, unspecified whether with hypoxia or hypercapnia: Secondary | ICD-10-CM | POA: Diagnosis not present

## 2016-10-08 DIAGNOSIS — M19012 Primary osteoarthritis, left shoulder: Secondary | ICD-10-CM | POA: Diagnosis not present

## 2016-10-08 DIAGNOSIS — S42255D Nondisplaced fracture of greater tuberosity of left humerus, subsequent encounter for fracture with routine healing: Secondary | ICD-10-CM | POA: Diagnosis not present

## 2016-10-08 DIAGNOSIS — M75102 Unspecified rotator cuff tear or rupture of left shoulder, not specified as traumatic: Secondary | ICD-10-CM | POA: Diagnosis not present

## 2016-10-09 DIAGNOSIS — R0600 Dyspnea, unspecified: Secondary | ICD-10-CM | POA: Diagnosis not present

## 2016-10-10 ENCOUNTER — Telehealth: Payer: Self-pay | Admitting: *Deleted

## 2016-10-10 NOTE — Telephone Encounter (Signed)
   [facility] Pre-operative Risk Assessment    Request for surgical clearance:   1. What type of surgery is being performed? Left Shoulder:Reversed total shoulder   2. When is this surgery scheduled? Waiting to schedule after clearance  3. Are there any medications that need to be held prior to surgery and how long? No medications Cardiac Clearance   4. Practice name and name of physician performing surgery? [facility] Veverly Fells, MD.  5. What is your office phone and fax number? Phone [phone] Fax [phone] Attention Velvet McBride  6. Anesthesia type (None, local, MAC, general) ? N/A   [place], Chasitie R 10/10/2016, 1:41 PM  _________________________________________________________________   (provider comments below)

## 2016-10-11 NOTE — Telephone Encounter (Signed)
    Chart reviewed as part of pre-operative protocol coverage. Because of [name]'s past medical history and time since last visit (last evaluated in 12/2015), he/she will require a follow-up visit in order to better assess preoperative cardiovascular risk.  Ellsworth Lennox, PA-C  10/11/2016, 2:42 PM

## 2016-10-12 ENCOUNTER — Encounter: Payer: Self-pay | Admitting: *Deleted

## 2016-10-12 NOTE — Telephone Encounter (Signed)
Follow up   Called pt and schedule an appointment with Herma Carson on Monday 10/15/16 at 2:15pm.

## 2016-10-15 ENCOUNTER — Encounter: Payer: Self-pay | Admitting: Physician Assistant

## 2016-10-15 ENCOUNTER — Ambulatory Visit (INDEPENDENT_AMBULATORY_CARE_PROVIDER_SITE_OTHER): Payer: Medicare Other | Admitting: Physician Assistant

## 2016-10-15 ENCOUNTER — Encounter (INDEPENDENT_AMBULATORY_CARE_PROVIDER_SITE_OTHER): Payer: Self-pay

## 2016-10-15 VITALS — BP 124/68 | HR 63 | Resp 16 | Wt 193.8 lb

## 2016-10-15 DIAGNOSIS — E1159 Type 2 diabetes mellitus with other circulatory complications: Secondary | ICD-10-CM | POA: Diagnosis not present

## 2016-10-15 DIAGNOSIS — E785 Hyperlipidemia, unspecified: Secondary | ICD-10-CM

## 2016-10-15 DIAGNOSIS — I10 Essential (primary) hypertension: Secondary | ICD-10-CM

## 2016-10-15 DIAGNOSIS — Z01810 Encounter for preprocedural cardiovascular examination: Secondary | ICD-10-CM

## 2016-10-15 DIAGNOSIS — I25810 Atherosclerosis of coronary artery bypass graft(s) without angina pectoris: Secondary | ICD-10-CM | POA: Diagnosis not present

## 2016-10-15 DIAGNOSIS — Z01818 Encounter for other preprocedural examination: Secondary | ICD-10-CM

## 2016-10-15 NOTE — Progress Notes (Signed)
Cardiology Office Note    Date:  10/15/2016   ID:  BLONG BUSK, DOB February 18, 1931, MRN [id number]  PCP:  Romero Belling, MD  Cardiologist: Dr. Clifton James Chief Complaint  Patient presents with  . Medical Clearance    History of Present Illness:  [name] is a 83 y.o. male with history of CAD status post CABG 5 in 1995 with 3 left cath 2006. No ischemia on stress Myoview 04/2010. Stress test ordered in 2015 but patient was unable to lie flat which is a chronic problem for him. History of SVT in 2014 but also bradycardia not on rate controlling meds. Will need to see EPS if symptoms from SVT.Marland Kitchen Echo in 2015 normal LV size and function mild AI and TR, mild pulmonary hypertension. Has chronic shortness of breath secondary to Charcot-Marie-Tooth syndrome.  Last saw Dr. Clifton James 12/07/15 and had stable angina. No beta blocker b/c of bradycardia.  Patient is here today for preoperative clearance before undergoing reversed total shoulder surgery by Dr. Ranell Patrick. He has Charcot Hilda Lias Tooth and can only walk very short distance before he gets out of breath. We cannot assess his functional capacity and he cannot lie flat for any type of nuclear stress testing. He still lives alone but has a lot of nerve muscle damage and can hardly walk with braces on both legs. His daughter and son-in-law who are with him help take care of him. He sleeps upright. He denies any chest tightness, pressure, dizziness or presyncope. He has chronic dyspnea on exertion. He has sharp shooting chest pains on occasion that lasts less than 5 minutes and eased spontaneously. He thinks it's indigestion. It's only when he reclines at night.Patient has been taking aspirin 325 mg daily since Dr. Juanda Chance had him on this and he does not want to change.   According to the Revised Cardiac Risk Index (RCRI), his Perioperative Risk of Major Cardiac Event is (%): 0.9  His Functional Capacity in METs is: 3.3 according to the [facility] Activity  Status Index (DASI).     Past Medical History:  Diagnosis Date  . ANEMIA, IRON DEFICIENCY   . BACK PAIN, LUMBAR   . Charcot-Marie-Tooth disease    assoiciated muscular dystrophy   . COPD (chronic obstructive pulmonary disease) (HCC)   . Coronary artery disease   . DIABETES MELLITUS, TYPE II   . GERD (gastroesophageal reflux disease)   . H/O: asbestos exposure   . Hypertension   . Leukopenia   . Pernicious anemia   . Smoker    quit 1968--25 ppy    Past Surgical History:  Procedure Laterality Date  . CORONARY ARTERY BYPASS GRAFT  1995    Current Medications: Current Meds  Medication Sig  . amoxicillin-clavulanate (AUGMENTIN) 875-125 MG tablet Take 1 tablet by mouth 2 (two) times daily.  . Ascorbic Acid (VITAMIN C) 500 MG tablet Take 500 mg by mouth 2 (two) times daily.   Marland Kitchen aspirin (GOODSENSE ASPIRIN) 325 MG tablet Take 325 mg by mouth.  . Blood Glucose Calibration (ADVOCATE REDI-CODE+ CONTROL) LOW SOLN   . Blood Glucose Monitoring Suppl (ADVOCATE REDI-CODE+) DEVI   . CLEVER CHEK AUTO-CODE VOICE test strip   . clotrimazole-betamethasone (LOTRISONE) cream Apply topically 2 (two) times daily as needed.   . Cyanocobalamin (VITAMIN B-12 IJ) Inject as directed. GET A B12 INJECTION ONCE EVERY MONTH  . cyanocobalamin 1000 MCG tablet Take 500 mcg by mouth 2 (two) times daily.   . famotidine (PEPCID) 20 MG tablet One  at bedtime  . gabapentin (NEURONTIN) 300 MG capsule Take 2 capsules (600 mg total) by mouth at bedtime.  . Inositol Niacinate (NIACIN FLUSH FREE) 500 MG CAPS Take by mouth.  Demetra Shiner Devices (SIMPLE DIAGNOSTICS LANCING DEV) MISC   . metFORMIN (GLUCOPHAGE-XR) 500 MG 24 hr tablet Take 2 tablets (1,000 mg total) by mouth daily.  . montelukast (SINGULAIR) 10 MG tablet Take 1 tablet (10 mg total) by mouth at bedtime.  . Multiple Vitamins-Minerals (MULTIVITAMIN WITH MINERALS) tablet Take 1 tablet by mouth daily.    . sertraline (ZOLOFT) 50 MG tablet Take 1 tablet (50 mg  total) by mouth daily.  . simvastatin (ZOCOR) 80 MG tablet Take 1 tablet (80 mg total) by mouth at bedtime.  . traMADol (ULTRAM) 50 MG tablet Take 1 tablet (50 mg total) by mouth every 12 (twelve) hours as needed.  . Zinc 50 MG CAPS Take 1 capsule by mouth.       Allergies:   Patient has no known allergies.   Social History   Social History  . Marital status: Widowed    Spouse name: N/A  . Number of children: N/A  . Years of education: N/A   Occupational History  . Retired Retired    Games developer   Social History Main Topics  . Smoking status: Former Smoker    Packs/day: 1.00    Years: 20.00    Types: Cigarettes    Quit date: 01/01/1966  . Smokeless tobacco: Never Used  . Alcohol use No  . Drug use: No  . Sexual activity: Not Asked   Other Topics Concern  . None   Social History Narrative   Was in service during [place] War.  Lives alone in a one story home.  On disability.             Family History:  The patient's family history includes COPD in his daughter; Charcot-Marie-Tooth disease in his brother, brother, brother, other, sister, sister, and son.   ROS:   Please see the history of present illness.    Review of Systems  Constitution: Negative.  HENT: Negative.   Eyes:       RECENT CATARACT SURGERY  Cardiovascular: Positive for dyspnea on exertion.  Respiratory: Positive for shortness of breath and sleep disturbances due to breathing.   Endocrine: Negative.   Hematologic/Lymphatic: Negative.   Musculoskeletal: Positive for arthritis, back pain, falls, joint pain, muscle weakness, myalgias and stiffness.  Gastrointestinal: Negative.   Genitourinary: Negative.   Neurological: Negative.    All other systems reviewed and are negative.   PHYSICAL EXAM:   VS:  BP 124/68   Pulse 63   Resp 16   Wt 193 lb 12.8 oz (87.9 kg)   SpO2 98%   BMI 27.81 kg/m   Physical Exam  GEN: Well nourished, well developed, in no acute distress  Neck: no JVD, carotid  bruits, or masses Cardiac:RRR;2/6 systolic murmur at the left sternal border Respiratory:  Decreased breath sounds throughout clear to auscultation bilaterally, normal work of breathing GI: soft, nontender, nondistended, + BS Ext: Legs with braces on both to aid walking without cyanosis, clubbing, or edema, Good distal pulses bilaterally Neuro:  Alert and Oriented x 3 Psych: euthymic mood, full affect  Wt Readings from Last 3 Encounters:  10/15/16 193 lb 12.8 oz (87.9 kg)  09/05/16 194 lb (88 kg)  03/05/16 194 lb (88 kg)      Studies/Labs Reviewed:   EKG:  EKG is  ordered  today.  The ekg ordered today demonstrates Normal sinus rhythm, normal EKG  Recent Labs: 03/05/2016: ALT 17; BUN 21; Creatinine, Ser 0.70; Hemoglobin 11.9; Platelets 185.0; Potassium 4.8; Sodium 141; TSH 1.61   Lipid Panel    Component Value Date/Time   CHOL 147 03/05/2016 1042   TRIG 93.0 03/05/2016 1042   HDL 56.00 03/05/2016 1042   CHOLHDL 3 03/05/2016 1042   VLDL 18.6 03/05/2016 1042   LDLCALC 72 03/05/2016 1042    Additional studies/ records that were reviewed today include:    Echo 01/12/13: Left ventricle: The cavity size was normal. There was mild focal basal hypertrophy of the septum. Systolic function was normal. The estimated ejection fraction was in the range of 55% to 60%. Wall motion was normal; there were no regional wall motion abnormalities. - Aortic valve: Mild regurgitation. - Left atrium: The atrium was mildly dilated. - Right ventricle: The cavity size was mildly dilated. Wall thickness was normal. - Tricuspid valve: Mild-moderate regurgitation. - Pulmonary arteries: PA peak pressure: 37mm Hg (S). Impressions:  - The right ventricular systolic pressure was increased consistent with mild pulmonary hypertension   Cardiac catheterization 2006  LEFT VENTRICULOGRAM:  The left ventriculogram in the RAO projection shows  good wall motion with no areas of hypokinesis. The estimated  ejection  fraction was 60%.    CONCLUSION:  1.  Coronary artery status post coronary bypass coronary artery bypass graft      surgery 1995.  2.  Severe native vessel disease with a total occlusion of the left anterior      descending artery, circumflex artery, and right coronary arteries.  3.  Patent vein graft to the distal right coronary artery, patent sequential      vein grafts to the marginal and posterolateral branches of the      circumflex artery, and patent sequential LIMA graft to the diagonal      branch LAD and LAD.  4.  Good LV function.    RECOMMENDATIONS:  All grafts are patent and there is no source of ischemia.  In view of these findings, I think, the patient's recent symptoms are not  cardiac in etiology. Dr. Sung Amabile is in the process of making a decision of  whether the patient will require a tracheostomy for his respiratory  problems.   ASSESSMENT:    1. Preoperative clearance   2. Atherosclerosis of coronary artery bypass graft of native heart without angina pectoris   3. Essential hypertension   4. Type 2 diabetes mellitus with other circulatory complication, without long-term current use of insulin (HCC)   5. Hyperlipidemia, unspecified hyperlipidemia type      PLAN:  In order of problems listed above:  Preoperative clearance for shoulder surgery. Discussed this patient in detail with Dr.McAlhany. Because of his Charcot Hilda Lias Tooth Syndrome we cannot assess his functional status. He gets short of breath with very little activity and cannot lie flat for nuclear stress test. He does not have angina. He had CABG 5 in 1995. Last cardiac catheterization 2006 grafts were patent and good LV function. Stress test in 2012 was low risk. He is at higher risk for surgery from a cardiac standpoint with his CAD but he does not have angina and understands the risks and would like to proceed with shoulder surgery. According to the Revised Cardiac Risk Index (RCRI), his  Perioperative Risk of Major Cardiac Event is (%): 0.9  His Functional Capacity in METs is: 3.3 according to the [facility] Activity Status  Index (DASI).  CAD as discussed above without angina. Told him he should reduce aspirin to 81 mg daily but he said Dr. Brodie told him in 1995 to take 325 mg daily and he Juanda Chancesn't want to change.  Essential hypertension controlled  Type 2 diabetes mellitus managed by primary care  Hyperlipidemia on Zocor    Medication Adjustments/Labs and Tests Ordered: Current medicines are reviewed at length with the patient today.  Concerns regarding medicines are outlined above.  Medication changes, Labs and Tests ordered today are listed in the Patient Instructions below. Patient Instructions  Your physician recommends that you continue on your current medications as directed. Please refer to the Current Medication list given to you today.  Keep follow up as planned-we will call or send letter to schedule with Dr. Clifton James.  Flu vaccine today    Signed, Jacolyn Reedy, PA-C  10/15/2016 3:10 PM    [facility] [address] [place], [place], [place]  [address] Phone: [phone]; Fax: [phone]

## 2016-10-15 NOTE — Patient Instructions (Addendum)
Your physician recommends that you continue on your current medications as directed. Please refer to the Current Medication list given to you today.  Keep follow up as planned-we will call or send letter to schedule with Dr. Clifton James.  Flu vaccine today

## 2016-10-18 ENCOUNTER — Encounter: Payer: Self-pay | Admitting: Sports Medicine

## 2016-10-18 ENCOUNTER — Ambulatory Visit (INDEPENDENT_AMBULATORY_CARE_PROVIDER_SITE_OTHER): Payer: Medicare Other | Admitting: Sports Medicine

## 2016-10-18 DIAGNOSIS — L97521 Non-pressure chronic ulcer of other part of left foot limited to breakdown of skin: Secondary | ICD-10-CM

## 2016-10-18 DIAGNOSIS — Q667 Congenital pes cavus, unspecified foot: Secondary | ICD-10-CM

## 2016-10-18 DIAGNOSIS — M216X9 Other acquired deformities of unspecified foot: Secondary | ICD-10-CM

## 2016-10-18 DIAGNOSIS — E1140 Type 2 diabetes mellitus with diabetic neuropathy, unspecified: Secondary | ICD-10-CM

## 2016-10-18 DIAGNOSIS — G600 Hereditary motor and sensory neuropathy: Secondary | ICD-10-CM

## 2016-10-18 NOTE — Progress Notes (Signed)
   Subjective:    Patient ID: David PaulsFrank J Pevey, male    DOB: 07-30-1931, 83 y.o.   MRN: [id number]  HPI    Review of Systems  All other systems reviewed and are negative.      Objective:   Physical Exam        Assessment & Plan:

## 2016-10-18 NOTE — Progress Notes (Signed)
Patient ID: [name], male   DOB: 1931/07/31, 83 y.o.   MRN: [id number]  Subjective: [name] is a 83 y.o. male patient seen in office for follow up evaluation of left foot, sub-met 1 ulcer; Patient reports that the swelling is better and that the ulceration has been dry however since Sunday has noticed some drainage in the wound appears to be getting larger.  Patient has no other pedal complaints at this time.  Patient is assisted by his daughter who assists with wound care, has been using dancer pad and PRISMA.  Patient Active Problem List   Diagnosis Date Noted  . Preoperative clearance 10/15/2016  . Hyperlipidemia 10/15/2016  . Diabetes ([place]) 03/10/2015  . Perennial and seasonal allergic rhinitis 12/20/2014  . Hematuria 12/20/2014  . Loss of weight 05/06/2014  . Other pancytopenia ([place]) 05/05/2014  . CAP (community acquired pneumonia) 05/05/2014  . Hypocalcemia 05/05/2014  . Skin lesion 04/30/2014  . 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 Abdominal pain, right upper quadrant 05/04/2013  . Cold intolerance 05/04/2013  . Low back pain 04/03/2012  . Encounter for long-term (current) use of other medications 12/11/2011  . Routine general medical examination at a health care facility 12/17/2010  . Screening for prostate cancer 12/12/2010  . Osteoarthrosis, unspecified whether generalized or localized, unspecified site 10/24/2010  . SKIN RASH 06/29/2009  . Iron deficiency anemia 03/10/2009  . BACK PAIN, LUMBAR 03/10/2009  . Moran DISEASE 05/03/2008  . DYSPNEA 05/03/2008  . DEPRESSIVE DISORDER NOT ELSEWHERE CLASSIFIED 05/22/2007  . CHEST PAIN 05/22/2007  . ANEMIA, PERNICIOUS 12/02/2006  . Essential hypertension 07/26/2006  . Coronary atherosclerosis 07/26/2006  . GERD 07/26/2006   Current Outpatient Prescriptions on File Prior to Visit  Medication Sig Dispense Refill  . amoxicillin-clavulanate (AUGMENTIN) 875-125 MG tablet Take 1 tablet by  mouth 2 (two) times daily. 28 tablet 0  . Ascorbic Acid (VITAMIN C) 500 MG tablet Take 500 mg by mouth 2 (two) times daily.     Marland Kitchen aspirin (GOODSENSE ASPIRIN) 325 MG tablet Take 325 mg by mouth.    . Blood Glucose Calibration (ADVOCATE REDI-CODE+ CONTROL) LOW SOLN   3  . Blood Glucose Monitoring Suppl (ADVOCATE REDI-CODE+) DEVI   1  . CLEVER CHEK AUTO-CODE VOICE test strip     . clotrimazole-betamethasone (LOTRISONE) cream Apply topically 2 (two) times daily as needed.     . Cyanocobalamin (VITAMIN B-12 IJ) Inject as directed. GET A B12 INJECTION ONCE EVERY MONTH    . cyanocobalamin 1000 MCG tablet Take 500 mcg by mouth 2 (two) times daily.     . famotidine (PEPCID) 20 MG tablet One at bedtime 30 tablet 2  . gabapentin (NEURONTIN) 300 MG capsule Take 2 capsules (600 mg total) by mouth at bedtime. 180 capsule 3  . Inositol Niacinate (NIACIN FLUSH FREE) 500 MG CAPS Take by mouth.    Elmore Guise Devices (SIMPLE DIAGNOSTICS LANCING DEV) MISC   1  . metFORMIN (GLUCOPHAGE-XR) 500 MG 24 hr tablet Take 2 tablets (1,000 mg total) by mouth daily. 180 tablet 3  . montelukast (SINGULAIR) 10 MG tablet Take 1 tablet (10 mg total) by mouth at bedtime. 90 tablet 3  . Multiple Vitamins-Minerals (MULTIVITAMIN WITH MINERALS) tablet Take 1 tablet by mouth daily.      . pantoprazole (PROTONIX) 40 MG tablet Take 1 tablet (40 mg total) by mouth daily. Take 30-60 min before first meal of the day 30 tablet 2  . sertraline (ZOLOFT) 50 MG tablet  Take 1 tablet (50 mg total) by mouth daily. 90 tablet 3  . simvastatin (ZOCOR) 80 MG tablet Take 1 tablet (80 mg total) by mouth at bedtime. 90 tablet 3  . traMADol (ULTRAM) 50 MG tablet Take 1 tablet (50 mg total) by mouth every 12 (twelve) hours as needed. 180 tablet 1  . Zinc 50 MG CAPS Take 1 capsule by mouth.       No current facility-administered medications on file prior to visit.    No Known Allergies  Recent Results (from the past 2160 hour(s))  POCT HgB A1C     Status:  None   Collection Time: 09/05/16 10:41 AM  Result Value Ref Range   Hemoglobin A1C 6.2     Objective: There were no vitals filed for this visit.  General: Patient is awake, alert, oriented x 3 and in no acute distress in electric wheelchair.  Dermatology: Skin is warm and dry bilateral with a ulceration Left sub-met, that measures 1 cm x 0.9 x0.3 cm with  a granular base with reactive keratosis that is soft and minimal in nature,trace edema bilateral, no cellulitis, no odor, There is mild keratosis at left medial heel with no signs of infection. Nails x 10 mycotic but short.    Vascular: Dorsalis Pedis pulse = 1/4 Bilateral,  Posterior Tibial pulse = 1/4 Bilateral,  Capillary Fill Time < 5 seconds  Neurologic: Protective sensation absent bilateral using the 5.07/10g Semmes Weinstein Monofilament.  Musculosketal: No pain with palpation to ulcerated area. Pes cavus foot type with prominent first metatarsal head bilateral, left greater than right. Hammertoes with Muscle weakness requiring bracing bilateral from Charcot-Marie-Tooth.  Assessment and Plan:  Problem List Items Addressed This Visit      Nervous and Auditory   CHARCOT-MARIE-TOOTH DISEASE    Other Visit Diagnoses    Foot ulcer, left, limited to breakdown of skin (HCC)    -  Primary   Pes cavus, congenital       Type 2 diabetes, controlled, with neuropathy (HCC)       Prominent metatarsal head, unspecified laterality         -Examined patient and discussed the progression of Recurrent wound -Mechanically debrided ulceration left sub-met one to healthy bleeding margins. Then applied offloading pad and PRISMA dressing to left foot; Patient's daughter to assist with dressing changes consisting of the same. -A request was placed for grafix PL to assist with wound healing. Since the wound is recurrent and has been ongoing and have failed to completely heal in the setting of diabetes with neuropathy -Orthoics were re-dispensed to  patient, after being modified with much improved fit -Continue with custom shoes and braces  -Continue with skin emollients to keratotic heel and dry skin areas -Continue with gabapentin for neuropathy -Patient to return to office for ulcer check in 3 weeks and possible grafting.  Landis Martins, DPM

## 2016-10-18 NOTE — Patient Instructions (Signed)

## 2016-10-19 ENCOUNTER — Telehealth: Payer: Self-pay | Admitting: *Deleted

## 2016-10-19 NOTE — Telephone Encounter (Addendum)
-----  Message from Landis Martins, [place] sent at 10/18/2016  2:07 PM EDT ----- Regarding: Grafix Please check insurance benefits for grafix PL  Wound measures 1x0.9x0.3 cm sub met 1 on left. 10/19/2016-Faxed required form, clinicals and demographics to [employer].

## 2016-10-25 ENCOUNTER — Telehealth: Payer: Self-pay

## 2016-10-25 NOTE — Telephone Encounter (Signed)
   [facility] Pre-operative Risk Assessment    Request for surgical clearance:  1. What type of surgery is being performed? Left shoulder: Reversed total shoulder   2. When is this surgery scheduled? Pending cardiac clearance   3. Are there any medications that need to be held prior to surgery and how long? Aspirin   4. Practice name and name of physician performing surgery? Dr. Esmond Plants  5. What is your office phone and fax number? Contact [facility] PHONE [phone] FAX [phone]  6. Anesthesia type (None, local, MAC, general) ? Not stated   Michaelyn Barter 10/25/2016, 3:55 PM  _________________________________________________________________  Patient saw Ermalinda Barrios PA on 10/15/16 for preoperative clearance. Faxed office note to number provided.

## 2016-10-29 DIAGNOSIS — J9610 Chronic respiratory failure, unspecified whether with hypoxia or hypercapnia: Secondary | ICD-10-CM | POA: Diagnosis not present

## 2016-10-30 NOTE — Telephone Encounter (Signed)
[name]. [place] - [employer] states wound measurements are not > 1 sq cm, does not qualify.

## 2016-10-30 NOTE — Telephone Encounter (Signed)
Same for this patient wound is 1cm see if grafix rep mark if they will cover graft for patient Thanks Dr. SKathie Rhodes

## 2016-11-01 NOTE — H&P (Signed)
[name] is an 83 y.o. male.    Chief Complaint: left shoulder pain  HPI: Pt is a 83 y.o. male complaining of left shoulder pain for multiple years. Pain had continually increased since the beginning. X-rays in the clinic show end-stage arthritic changes of the left shoulder. Pt has tried various conservative treatments which have failed to alleviate their symptoms, including injections and therapy. Various options are discussed with the patient. Risks, benefits and expectations were discussed with the patient. Patient understand the risks, benefits and expectations and wishes to proceed with surgery.   PCP:  Romero BellingEllison, Sean, MD  D/C Plans: Home  PMH: Past Medical History:  Diagnosis Date  . ANEMIA, IRON DEFICIENCY   . BACK PAIN, LUMBAR   . Charcot-Marie-Tooth disease    assoiciated muscular dystrophy   . COPD (chronic obstructive pulmonary disease) (HCC)   . Coronary artery disease   . DIABETES MELLITUS, TYPE II   . GERD (gastroesophageal reflux disease)   . H/O: asbestos exposure   . Hypertension   . Leukopenia   . Pernicious anemia   . Smoker    quit 1968--25 ppy    PSH: Past Surgical History:  Procedure Laterality Date  . CORONARY ARTERY BYPASS GRAFT  1995    Social History:  reports that he quit smoking about 50 years ago. His smoking use included Cigarettes. He has a 20.00 pack-year smoking history. He has never used smokeless tobacco. He reports that he does not drink alcohol or use drugs.  Allergies:  No Known Allergies  Medications: No current facility-administered medications for this encounter.    Current Outpatient Prescriptions  Medication Sig Dispense Refill  . amoxicillin-clavulanate (AUGMENTIN) 875-125 MG tablet Take 1 tablet by mouth 2 (two) times daily. 28 tablet 0  . Ascorbic Acid (VITAMIN C) 500 MG tablet Take 500 mg by mouth 2 (two) times daily.     Marland Kitchen. aspirin (GOODSENSE ASPIRIN) 325 MG tablet Take 325 mg by mouth.    . Blood Glucose  Calibration (ADVOCATE REDI-CODE+ CONTROL) LOW SOLN   3  . Blood Glucose Monitoring Suppl (ADVOCATE REDI-CODE+) DEVI   1  . CLEVER CHEK AUTO-CODE VOICE test strip     . clotrimazole-betamethasone (LOTRISONE) cream Apply topically 2 (two) times daily as needed.     . Cyanocobalamin (VITAMIN B-12 IJ) Inject as directed. GET A B12 INJECTION ONCE EVERY MONTH    . cyanocobalamin 1000 MCG tablet Take 500 mcg by mouth 2 (two) times daily.     . famotidine (PEPCID) 20 MG tablet One at bedtime 30 tablet 2  . gabapentin (NEURONTIN) 300 MG capsule Take 2 capsules (600 mg total) by mouth at bedtime. 180 capsule 3  . Inositol Niacinate (NIACIN FLUSH FREE) 500 MG CAPS Take by mouth.    Demetra Shiner. Lancet Devices (SIMPLE DIAGNOSTICS LANCING DEV) MISC   1  . metFORMIN (GLUCOPHAGE-XR) 500 MG 24 hr tablet Take 2 tablets (1,000 mg total) by mouth daily. 180 tablet 3  . montelukast (SINGULAIR) 10 MG tablet Take 1 tablet (10 mg total) by mouth at bedtime. 90 tablet 3  . Multiple Vitamins-Minerals (MULTIVITAMIN WITH MINERALS) tablet Take 1 tablet by mouth daily.      . pantoprazole (PROTONIX) 40 MG tablet Take 1 tablet (40 mg total) by mouth daily. Take 30-60 min before first meal of the day 30 tablet 2  . sertraline (ZOLOFT) 50 MG tablet Take 1 tablet (50 mg total) by mouth daily. 90 tablet 3  . simvastatin (ZOCOR) 80  MG tablet Take 1 tablet (80 mg total) by mouth at bedtime. 90 tablet 3  . traMADol (ULTRAM) 50 MG tablet Take 1 tablet (50 mg total) by mouth every 12 (twelve) hours as needed. 180 tablet 1  . Zinc 50 MG CAPS Take 1 capsule by mouth.        No results found for this or any previous visit (from the past 48 hour(s)). No results found.  ROS: Pain with rom of the left upper extremity  Physical Exam:  Alert and oriented 83 y.o. male in no acute distress Cranial nerves 2-12 intact Cervical spine: full rom with no tenderness, nv intact distally Chest: active breath sounds bilaterally, no wheeze rhonchi or  rales Heart: regular rate and rhythm, no murmur Abd: non tender non distended with active bowel sounds Hip is stable with rom  Left shoulder with painful rom and crepitus Moderate weakness with ER and IR No rashes   Assessment/Plan Assessment: left shoulder cuff arthropathy  Plan: Patient will undergo a left reverse total shoulder by Dr. Ranell Patrick at [facility]. Risks benefits and expectations were discussed with the patient. Patient understand risks, benefits and expectations and wishes to proceed.

## 2016-11-07 ENCOUNTER — Telehealth: Payer: Self-pay | Admitting: Sports Medicine

## 2016-11-07 NOTE — Telephone Encounter (Signed)
Somebody called here from the hospital and I wasn't home. I'm returning the call. I'm supposed to come to the hospital to have a surgery operation. So if you can call me back at [phone]. Thank you.

## 2016-11-08 ENCOUNTER — Ambulatory Visit (INDEPENDENT_AMBULATORY_CARE_PROVIDER_SITE_OTHER): Payer: Medicare Other | Admitting: Sports Medicine

## 2016-11-08 ENCOUNTER — Encounter: Payer: Self-pay | Admitting: Sports Medicine

## 2016-11-08 DIAGNOSIS — L97521 Non-pressure chronic ulcer of other part of left foot limited to breakdown of skin: Secondary | ICD-10-CM

## 2016-11-08 DIAGNOSIS — Q667 Congenital pes cavus, unspecified foot: Secondary | ICD-10-CM

## 2016-11-08 DIAGNOSIS — G600 Hereditary motor and sensory neuropathy: Secondary | ICD-10-CM

## 2016-11-08 DIAGNOSIS — R269 Unspecified abnormalities of gait and mobility: Secondary | ICD-10-CM

## 2016-11-08 DIAGNOSIS — E1140 Type 2 diabetes mellitus with diabetic neuropathy, unspecified: Secondary | ICD-10-CM

## 2016-11-08 DIAGNOSIS — M216X9 Other acquired deformities of unspecified foot: Secondary | ICD-10-CM

## 2016-11-08 NOTE — Progress Notes (Signed)
Patient ID: [name], male   DOB: 1931-05-31, 83 y.o.   MRN: [id number]  Subjective: [name] is a 83 y.o. male patient seen in office for follow up evaluation of left foot, sub-met 1 ulcer; Patient reports that he is doing ok. Wound still draining some and is larger.  Patient has no other pedal complaints at this time.  Patient is assisted by his daughter who assists with wound care, has been using dancer pad and PRISMA.  Patient Active Problem List   Diagnosis Date Noted  . Preoperative clearance 10/15/2016  . Hyperlipidemia 10/15/2016  . Diabetes ([place]) 03/10/2015  . Perennial and seasonal allergic rhinitis 12/20/2014  . Hematuria 12/20/2014  . Loss of weight 05/06/2014  . Other pancytopenia ([place]) 05/05/2014  . CAP (community acquired pneumonia) 05/05/2014  . Hypocalcemia 05/05/2014  . Skin lesion 04/30/2014  . 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 Abdominal pain, right upper quadrant 05/04/2013  . Cold intolerance 05/04/2013  . Low back pain 04/03/2012  . Encounter for long-term (current) use of other medications 12/11/2011  . Routine general medical examination at a health care facility 12/17/2010  . Screening for prostate cancer 12/12/2010  . Osteoarthrosis, unspecified whether generalized or localized, unspecified site 10/24/2010  . SKIN RASH 06/29/2009  . Iron deficiency anemia 03/10/2009  . BACK PAIN, LUMBAR 03/10/2009  . [place] DISEASE 05/03/2008  . DYSPNEA 05/03/2008  . DEPRESSIVE DISORDER NOT ELSEWHERE CLASSIFIED 05/22/2007  . CHEST PAIN 05/22/2007  . ANEMIA, PERNICIOUS 12/02/2006  . Essential hypertension 07/26/2006  . Coronary atherosclerosis 07/26/2006  . GERD 07/26/2006   Current Outpatient Medications on File Prior to Visit  Medication Sig Dispense Refill  . amoxicillin-clavulanate (AUGMENTIN) 875-125 MG tablet Take 1 tablet by mouth 2 (two) times daily. 28 tablet 0  . Ascorbic Acid (VITAMIN C) 500 MG tablet Take 500 mg by  mouth 2 (two) times daily.     Marland Kitchen aspirin (GOODSENSE ASPIRIN) 325 MG tablet Take 325 mg by mouth.    . Blood Glucose Calibration (ADVOCATE REDI-CODE+ CONTROL) LOW SOLN   3  . Blood Glucose Monitoring Suppl (ADVOCATE REDI-CODE+) DEVI   1  . CLEVER CHEK AUTO-CODE VOICE test strip     . clotrimazole-betamethasone (LOTRISONE) cream Apply topically 2 (two) times daily as needed.     . Cyanocobalamin (VITAMIN B-12 IJ) Inject as directed. GET A B12 INJECTION ONCE EVERY MONTH    . cyanocobalamin 1000 MCG tablet Take 500 mcg by mouth 2 (two) times daily.     . famotidine (PEPCID) 20 MG tablet One at bedtime 30 tablet 2  . gabapentin (NEURONTIN) 300 MG capsule Take 2 capsules (600 mg total) by mouth at bedtime. 180 capsule 3  . Inositol Niacinate (NIACIN FLUSH FREE) 500 MG CAPS Take by mouth.    Elmore Guise Devices (SIMPLE DIAGNOSTICS LANCING DEV) MISC   1  . metFORMIN (GLUCOPHAGE-XR) 500 MG 24 hr tablet Take 2 tablets (1,000 mg total) by mouth daily. 180 tablet 3  . montelukast (SINGULAIR) 10 MG tablet Take 1 tablet (10 mg total) by mouth at bedtime. 90 tablet 3  . Multiple Vitamins-Minerals (MULTIVITAMIN WITH MINERALS) tablet Take 1 tablet by mouth daily.      . pantoprazole (PROTONIX) 40 MG tablet Take 1 tablet (40 mg total) by mouth daily. Take 30-60 min before first meal of the day 30 tablet 2  . sertraline (ZOLOFT) 50 MG tablet Take 1 tablet (50 mg total) by mouth daily. 90 tablet 3  . simvastatin (  ZOCOR) 80 MG tablet Take 1 tablet (80 mg total) by mouth at bedtime. 90 tablet 3  . traMADol (ULTRAM) 50 MG tablet Take 1 tablet (50 mg total) by mouth every 12 (twelve) hours as needed. 180 tablet 1  . Zinc 50 MG CAPS Take 1 capsule by mouth.       No current facility-administered medications on file prior to visit.    No Known Allergies  Recent Results (from the past 2160 hour(s))  POCT HgB A1C     Status: None   Collection Time: 09/05/16 10:41 AM  Result Value Ref Range   Hemoglobin A1C 6.2      Objective: There were no vitals filed for this visit.  General: Patient is awake, alert, oriented x 3 and in no acute distress in electric wheelchair.  Dermatology: Skin is warm and dry bilateral with a ulceration Left sub-met, that measures 1 cm x 1.5  x0.3 cm (larger than previous) with  a granular base with reactive keratosis, trace edema bilateral, no cellulitis, no odor, There is mild keratosis at left medial heel with no signs of infection. Nails x 10 mycotic but short.    Vascular: Dorsalis Pedis pulse = 1/4 Bilateral,  Posterior Tibial pulse = 1/4 Bilateral,  Capillary Fill Time < 5 seconds  Neurologic: Protective sensation absent bilateral using the 5.07/10g Semmes Weinstein Monofilament.  Musculosketal: No pain with palpation to ulcerated area. Pes cavus foot type with prominent first metatarsal head bilateral, left greater than right. Hammertoes with Muscle weakness requiring bracing bilateral from Charcot-Marie-Tooth.  Assessment and Plan:  Problem List Items Addressed This Visit      Nervous and Auditory   CHARCOT-MARIE-TOOTH DISEASE    Other Visit Diagnoses    Foot ulcer, left, limited to breakdown of skin (HCC)    -  Primary   Pes cavus, congenital       Type 2 diabetes, controlled, with neuropathy (HCC)       Prominent metatarsal head, unspecified laterality       Abnormality of gait         -Examined patient and discussed the progression of Recurrent wound -Mechanically debrided ulceration left sub-met one to healthy bleeding margins. Then applied offloading pad and PRISMA dressing to left foot; Patient's daughter to assist with dressing changes consisting of the same. -A request was placed for EPIFIX to assist with wound healing. Since the wound is larger and recurrent and has been ongoing and have failed to completely heal in the setting of diabetes with neuropathy -Continue with custom shoes and braces and orthotics   -Continue with skin emollients to  keratotic heel and dry skin areas -Continue with gabapentin for neuropathy -Patient to return to office for ulcer check in 3 weeks and possible grafting.  Landis Martins, DPM

## 2016-11-09 ENCOUNTER — Telehealth: Payer: Self-pay | Admitting: *Deleted

## 2016-11-09 NOTE — Telephone Encounter (Addendum)
-----   Message from Asencion Islamitorya Stover, [place] sent at 11/08/2016  1:43 PM EST ----- Regarding: Epifix for next visit  Please re-submit request wound is larger Thanks Dr. [name]. Faxed required form, Clinicals and demographics to [employer].

## 2016-11-13 NOTE — Telephone Encounter (Signed)
Received Benefits Analysis for EpiFix - Denied does not meet Medicare/UHC guidelines. Routed message to Dr. [name] to perform PEER TO PEER, to schedule [phone], Reference # [id number].

## 2016-11-14 ENCOUNTER — Other Ambulatory Visit: Payer: Self-pay

## 2016-11-14 ENCOUNTER — Encounter (HOSPITAL_COMMUNITY): Payer: Self-pay

## 2016-11-14 ENCOUNTER — Encounter (HOSPITAL_COMMUNITY)
Admission: RE | Admit: 2016-11-14 | Discharge: 2016-11-14 | Disposition: A | Discharge status: Home / Self Care | Payer: Medicare Other | Source: Ambulatory Visit | Attending: Orthopedic Surgery | Admitting: Orthopedic Surgery

## 2016-11-14 DIAGNOSIS — K219 Gastro-esophageal reflux disease without esophagitis: Secondary | ICD-10-CM | POA: Diagnosis not present

## 2016-11-14 DIAGNOSIS — Z951 Presence of aortocoronary bypass graft: Secondary | ICD-10-CM | POA: Diagnosis not present

## 2016-11-14 DIAGNOSIS — Z7984 Long term (current) use of oral hypoglycemic drugs: Secondary | ICD-10-CM | POA: Diagnosis not present

## 2016-11-14 DIAGNOSIS — Z79899 Other long term (current) drug therapy: Secondary | ICD-10-CM | POA: Diagnosis not present

## 2016-11-14 DIAGNOSIS — J449 Chronic obstructive pulmonary disease, unspecified: Secondary | ICD-10-CM | POA: Diagnosis not present

## 2016-11-14 DIAGNOSIS — E119 Type 2 diabetes mellitus without complications: Secondary | ICD-10-CM | POA: Diagnosis not present

## 2016-11-14 DIAGNOSIS — M19012 Primary osteoarthritis, left shoulder: Secondary | ICD-10-CM | POA: Diagnosis not present

## 2016-11-14 DIAGNOSIS — Z87891 Personal history of nicotine dependence: Secondary | ICD-10-CM | POA: Diagnosis not present

## 2016-11-14 DIAGNOSIS — Z7982 Long term (current) use of aspirin: Secondary | ICD-10-CM | POA: Diagnosis not present

## 2016-11-14 DIAGNOSIS — G4730 Sleep apnea, unspecified: Secondary | ICD-10-CM | POA: Diagnosis not present

## 2016-11-14 DIAGNOSIS — Z77090 Contact with and (suspected) exposure to asbestos: Secondary | ICD-10-CM | POA: Diagnosis not present

## 2016-11-14 DIAGNOSIS — I2510 Atherosclerotic heart disease of native coronary artery without angina pectoris: Secondary | ICD-10-CM | POA: Diagnosis not present

## 2016-11-14 DIAGNOSIS — I10 Essential (primary) hypertension: Secondary | ICD-10-CM | POA: Diagnosis not present

## 2016-11-14 DIAGNOSIS — I252 Old myocardial infarction: Secondary | ICD-10-CM | POA: Diagnosis not present

## 2016-11-14 DIAGNOSIS — M75102 Unspecified rotator cuff tear or rupture of left shoulder, not specified as traumatic: Secondary | ICD-10-CM | POA: Diagnosis not present

## 2016-11-14 HISTORY — DX: Acute myocardial infarction, unspecified: I21.9

## 2016-11-14 HISTORY — DX: Unspecified osteoarthritis, unspecified site: M19.90

## 2016-11-14 HISTORY — DX: Sleep apnea, unspecified: G47.30

## 2016-11-14 HISTORY — DX: Pneumonia, unspecified organism: J18.9

## 2016-11-14 LAB — CBC
HCT: 36.4 % — ABNORMAL LOW (ref 39.0–52.0)
Hemoglobin: 11.7 g/dL — ABNORMAL LOW (ref 13.0–17.0)
MCH: 29.5 pg (ref 26.0–34.0)
MCHC: 32.1 g/dL (ref 30.0–36.0)
MCV: 91.7 fL (ref 78.0–100.0)
PLATELETS: 173 10*3/uL (ref 150–400)
RBC: 3.97 MIL/uL — AB (ref 4.22–5.81)
RDW: 13.9 % (ref 11.5–15.5)
WBC: 4.4 10*3/uL (ref 4.0–10.5)

## 2016-11-14 LAB — HEMOGLOBIN A1C
Hgb A1c MFr Bld: 6.5 % — ABNORMAL HIGH (ref 4.8–5.6)
Mean Plasma Glucose: 139.85 mg/dL

## 2016-11-14 LAB — BASIC METABOLIC PANEL
Anion gap: 7 (ref 5–15)
BUN: 21 mg/dL — AB (ref 6–20)
CO2: 26 mmol/L (ref 22–32)
Calcium: 9.2 mg/dL (ref 8.9–10.3)
Chloride: 102 mmol/L (ref 101–111)
Creatinine, Ser: 0.72 mg/dL (ref 0.61–1.24)
GFR calc Af Amer: >60 mL/min (ref >60)
GLUCOSE: 103 mg/dL — AB (ref 65–99)
POTASSIUM: 4.7 mmol/L (ref 3.5–5.1)
Sodium: 135 mmol/L (ref 135–145)

## 2016-11-14 LAB — SURGICAL PCR SCREEN
MRSA, PCR: NEGATIVE
STAPHYLOCOCCUS AUREUS: NEGATIVE

## 2016-11-14 NOTE — Progress Notes (Addendum)
PCP - Dr. Everardo AllEllison Cardiologist - Dr. Clifton JamesMcAlhany; cardiac note in chart  Chest x-ray - n/a EKG - 10/15/2016 Stress Test - 2012 ECHO - 2015 Cardiac Cath - 2006, per patient  Sleep Study - patient and patient's daughter states it was over 25 years ago CPAP - yes; patient and patient's daughter instructed to bring CPAP mask to hospital  Fasting Blood Sugar - ? Checks Blood Sugar 0 times a day  Blood Thinner Instructions: Aspirin Instructions: patient stated his last dose of ASA was on Saturday 11/10/2016  Reviewed patient's vital signs with patient and patient's daughter.  Both stated that it was normal for the patient to have a lower HR, ranging from the 50's-60's.  I also asked the patient about his blood pressure and both the patient and patient's daughter stated it runs lower.  Will recheck vital signs prior to patient leaving.  Patient denies shortness of breath, fever, cough and chest pain at PAT appointment. - no new vital signs obtained.  Patient left PAT after lab draw before I could get new vital signs.   Patient verbalized understanding of instructions that were given to them at the PAT appointment. Patient was also instructed that they will need to review over the PAT instructions again at home before surgery.

## 2016-11-14 NOTE — Pre-Procedure Instructions (Addendum)
Feliberto GottronFrank J Sinclair  11/14/2016      [facility] - [place], [place] - [address] [facility] [id number] Phone: [phone] Fax: [phone]    Your procedure is scheduled on 11/16/2016.  [facility] at 1:10 [facility].M.  Call this number if you have problems the morning of surgery:  [phone]   Remember:  Do not eat food or drink liquids after midnight.   Take these medicines the morning of surgery with A SIP OF WATER: Acetaminophen (tylenol) - if needed Sertraline (zoloft) Tramadol (Ultram) - if needed  7 days prior to surgery STOP taking any Aspirin, Aleve, Naproxen, Ibuprofen, Motrin, Advil, Goody's, BC's, all herbal medications, fish oil, and all vitamins   WHAT DO I DO ABOUT MY DIABETES MEDICATION? Marland Kitchen. Do not take oral diabetes medicines (pills) the morning of surgery.  How to Manage Your Diabetes Before and After Surgery  Why is it important to control my blood sugar before and after surgery? . Improving blood sugar levels before and after surgery helps healing and can limit problems. . A way of improving blood sugar control is eating a healthy diet by: o  Eating less sugar and carbohydrates o  Increasing activity/exercise o  Talking with your doctor about reaching your blood sugar goals . High blood sugars (greater than 180 mg/dL) can raise your risk of infections and slow your recovery, so you will need to focus on controlling your diabetes during the weeks before surgery. . Make sure that the doctor who takes care of your diabetes knows about your planned surgery including the date and location.  How do I manage my blood sugar before surgery? . Check your blood sugar at least 4 times a day, starting 2 days before surgery, to make sure that the level is not too high or low. o Check your blood sugar the morning of your surgery when you wake up and every 2 hours until you get to the Short Stay unit. . If your  blood sugar is less than 70 mg/dL, you will need to treat for low blood sugar: o Do not take insulin. o Treat a low blood sugar (less than 70 mg/dL) with  cup of clear juice (cranberry or apple), 4 glucose tablets, OR glucose gel. Recheck blood sugar in 15 minutes after treatment (to make sure it is greater than 70 mg/dL). If your blood sugar is not greater than 70 mg/dL on recheck, call [phone] o  for further instructions. . Report your blood sugar to the short stay nurse when you get to Short Stay.  . If you are admitted to the hospital after surgery: o Your blood sugar will be checked by the staff and you will probably be given insulin after surgery (instead of oral diabetes medicines) to make sure you have good blood sugar levels. o The goal for blood sugar control after surgery is 80-180 mg/dL.      Do not wear jewelry  Do not wear lotions, powders, or colognes, or deodorant.  Men may shave face and neck.  Do not bring valuables to the hospital.  [facility] is not responsible for any belongings or valuables.  Contacts, eyeglasses, dentures or bridgework may not be worn into surgery.  Leave your suitcase in the car.  After surgery it may be brought to your room.  For patients admitted to the hospital, discharge time will be determined by your treatment team.  Patients discharged  the day of surgery will not be allowed to drive home.   Name and phone number of your driver:    Special instructions:   [facility]- Preparing For Surgery  Before surgery, you can play an important role. Because skin is not sterile, your skin needs to be as free of germs as possible. You can reduce the number of germs on your skin by washing with CHG (chlorahexidine gluconate) Soap before surgery.  CHG is an antiseptic cleaner which kills germs and bonds with the skin to continue killing germs even after washing.  Please do not use if you have an allergy to CHG or antibacterial soaps. If your skin  becomes reddened/irritated stop using the CHG.  Do not shave (including legs and underarms) for at least 48 hours prior to first CHG shower. It is OK to shave your face.  Please follow these instructions carefully.   1. Shower the NIGHT BEFORE SURGERY and the MORNING OF SURGERY with CHG.   2. If you chose to wash your hair, wash your hair first as usual with your normal shampoo.  3. After you shampoo, rinse your hair and body thoroughly to remove the shampoo.  4. Use CHG as you would any other liquid soap. You can apply CHG directly to the skin and wash gently with a scrungie or a clean washcloth.   5. Apply the CHG Soap to your body ONLY FROM THE NECK DOWN.  Do not use on open wounds or open sores. Avoid contact with your eyes, ears, mouth and genitals (private parts). Wash Face and genitals (private parts)  with your normal soap.  6. Wash thoroughly, paying special attention to the area where your surgery will be performed.  7. Thoroughly rinse your body with warm water from the neck down.  8. DO NOT shower/wash with your normal soap after using and rinsing off the CHG Soap.  9. Pat yourself dry with a CLEAN TOWEL.  10. Wear CLEAN PAJAMAS to bed the night before surgery, wear comfortable clothes the morning of surgery  11. Place CLEAN SHEETS on your bed the night of your first shower and DO NOT SLEEP WITH PETS.    Day of Surgery: Shower as stated above. Do not apply any deodorants/lotions. Please wear clean clothes to the hospital/surgery center.      Please read over the following fact sheets that you were given. Pain Booklet, Coughing and Deep Breathing, Total Joint Packet and Surgical Site Infection Prevention

## 2016-11-14 NOTE — Telephone Encounter (Signed)
When I called they said the case has been closed and that we will need to submit an appeal -Dr. [name]

## 2016-11-15 LAB — GLUCOSE, CAPILLARY: GLUCOSE-CAPILLARY: 112 mg/dL — AB (ref 65–99)

## 2016-11-15 NOTE — Progress Notes (Signed)
Anesthesia Chart Review:  Pt is an 83 year old male scheduled for L reverse shoulder arthroplasty on 11/16/2016 with Beverely LowSteve Norris, MD  - PCP is Romero BellingSean Ellison, MD - Cardiologist is Verne Carrowhristopher McAlhany, MD. Pt cleared for surgery at last office visit 10/15/16 with Jacolyn ReedyMichele Lenze, PA who discussed case with Dr. Clifton JamesMcAlhany. "Because of his Charcot Hilda LiasMarie Tooth Syndrome we cannot assess his functional status. He gets short of breath with very little activity and cannot lie flat for nuclear stress test. He does not have angina. He had CABG 5 in 1995. Last cardiac catheterization 2006 grafts were patent and good LV function. Stress test in 2012 was low risk. He is at higher risk for surgery from a cardiac standpoint with his CAD but he does not have angina and understands the risks and would like to proceed with shoulder surgery."  PMH includes:  CAD (s/p CABG x5 1995), HTN, DM, hyperlipidemia, COPD, hx of asbestos exposure, OSA, iron deficiency anemia, pernicious anemia, leukopenia, charcot-marie-tooth, GERD. Former smoker (quit 1968). BMI 27  Medications include: ASA 325 mg, iron, niacin, metformin, simvastatin  BP 97/82   Pulse (!) 57   Temp 36.7 C   Resp 20   Ht 5\' 9"  (1.753 m)   Wt 182 lb (82.6 kg)   SpO2 100%   BMI 26.88 kg/m   Preoperative labs reviewed.   - HbA1c 6.5, glucose 103.     EKG 10/15/16: NSR.   Echo 01/12/13:  - Left ventricle: The cavity size was normal. There was mildfocal basal hypertrophy of the septum. Systolic functionwas normal. The estimated ejection fraction was in therange of 55% to 60%. Wall motion was normal; there were noregional wall motion abnormalities. - Aortic valve: Mild regurgitation. - Left atrium: The atrium was mildly dilated. - Right ventricle: The cavity size was mildly dilated. Wallthickness was normal. - Tricuspid valve: Mild-moderate regurgitation. - Pulmonary arteries: PA peak pressure: 37mm Hg (S). - Impressions: The right ventricular  systolic pressure was increased consistent with mild pulmonary hypertension.  Cardiopulmonary exercise test 05/14/11:  - final report not in Epic. Preliminary report reads:  - Exercise testing with gas exchange demonstrates a moderate functional limitation when compared to matched sedentary norms. There is a clear ventilatory limitation to the exercise with associated respiratory muscle fatigue and possibly a positive EIB response. Increasing stroke volume and mildly reduced OUES suggest the chronotropic incompetence seen during the test are likely related to the early cessation of the exercise due to severe dyspnea.  Nuclear stress test 04/11/10: Normal stress nuclear study.  Cardiac cath 09/01/04:  1.  Coronary artery status post coronary bypass coronary artery bypass graft surgery 1995.  2.  Severe native vessel disease with a total occlusion of the left anterior descending artery, circumflex artery, and right coronary arteries.  3.  Patent vein graft to the distal right coronary artery, patent sequential vein grafts to the marginal and posterolateral branches of the circumflex artery, and patent sequential LIMA graft to the diagonal branch LAD and LAD.  4.  Good LV function.  If no changes, I anticipate pt can proceed with surgery as scheduled.   Rica Mastngela Jadian Karman, FNP-BC [facility]Anesthesiology Phone: [phone] 11/15/2016 2:01 PM

## 2016-11-16 ENCOUNTER — Telehealth: Payer: Self-pay | Admitting: *Deleted

## 2016-11-16 ENCOUNTER — Inpatient Hospital Stay (HOSPITAL_COMMUNITY)
Admission: RE | Admit: 2016-11-16 | Discharge: 2016-11-17 | DRG: 483 | Disposition: A | Discharge status: Home Health | Payer: Medicare Other | Source: Ambulatory Visit | Attending: Orthopedic Surgery | Admitting: Orthopedic Surgery

## 2016-11-16 ENCOUNTER — Inpatient Hospital Stay (HOSPITAL_COMMUNITY): Payer: Medicare Other | Admitting: Emergency Medicine

## 2016-11-16 ENCOUNTER — Inpatient Hospital Stay (HOSPITAL_COMMUNITY): Payer: Medicare Other

## 2016-11-16 ENCOUNTER — Encounter (HOSPITAL_COMMUNITY): Payer: Self-pay | Admitting: Certified Registered"

## 2016-11-16 ENCOUNTER — Other Ambulatory Visit: Payer: Self-pay

## 2016-11-16 ENCOUNTER — Inpatient Hospital Stay (HOSPITAL_COMMUNITY): Payer: Medicare Other | Admitting: Certified Registered"

## 2016-11-16 ENCOUNTER — Encounter (HOSPITAL_COMMUNITY)
Admission: RE | Disposition: A | Discharge status: Home Health | Payer: Self-pay | Source: Ambulatory Visit | Attending: Orthopedic Surgery

## 2016-11-16 DIAGNOSIS — M75102 Unspecified rotator cuff tear or rupture of left shoulder, not specified as traumatic: Secondary | ICD-10-CM | POA: Diagnosis not present

## 2016-11-16 DIAGNOSIS — Z951 Presence of aortocoronary bypass graft: Secondary | ICD-10-CM

## 2016-11-16 DIAGNOSIS — G600 Hereditary motor and sensory neuropathy: Secondary | ICD-10-CM | POA: Diagnosis present

## 2016-11-16 DIAGNOSIS — Z77090 Contact with and (suspected) exposure to asbestos: Secondary | ICD-10-CM | POA: Diagnosis present

## 2016-11-16 DIAGNOSIS — Z7984 Long term (current) use of oral hypoglycemic drugs: Secondary | ICD-10-CM | POA: Diagnosis not present

## 2016-11-16 DIAGNOSIS — J449 Chronic obstructive pulmonary disease, unspecified: Secondary | ICD-10-CM | POA: Diagnosis present

## 2016-11-16 DIAGNOSIS — I252 Old myocardial infarction: Secondary | ICD-10-CM | POA: Diagnosis not present

## 2016-11-16 DIAGNOSIS — Z87891 Personal history of nicotine dependence: Secondary | ICD-10-CM

## 2016-11-16 DIAGNOSIS — M19012 Primary osteoarthritis, left shoulder: Secondary | ICD-10-CM | POA: Diagnosis not present

## 2016-11-16 DIAGNOSIS — M12812 Other specific arthropathies, not elsewhere classified, left shoulder: Secondary | ICD-10-CM | POA: Diagnosis not present

## 2016-11-16 DIAGNOSIS — Z7982 Long term (current) use of aspirin: Secondary | ICD-10-CM

## 2016-11-16 DIAGNOSIS — G4730 Sleep apnea, unspecified: Secondary | ICD-10-CM | POA: Diagnosis present

## 2016-11-16 DIAGNOSIS — K219 Gastro-esophageal reflux disease without esophagitis: Secondary | ICD-10-CM | POA: Diagnosis present

## 2016-11-16 DIAGNOSIS — Z96612 Presence of left artificial shoulder joint: Secondary | ICD-10-CM

## 2016-11-16 DIAGNOSIS — E119 Type 2 diabetes mellitus without complications: Secondary | ICD-10-CM | POA: Diagnosis not present

## 2016-11-16 DIAGNOSIS — Z471 Aftercare following joint replacement surgery: Secondary | ICD-10-CM | POA: Diagnosis not present

## 2016-11-16 DIAGNOSIS — I2510 Atherosclerotic heart disease of native coronary artery without angina pectoris: Secondary | ICD-10-CM | POA: Diagnosis present

## 2016-11-16 DIAGNOSIS — Z79899 Other long term (current) drug therapy: Secondary | ICD-10-CM | POA: Diagnosis not present

## 2016-11-16 DIAGNOSIS — I10 Essential (primary) hypertension: Secondary | ICD-10-CM | POA: Diagnosis not present

## 2016-11-16 HISTORY — PX: REVERSE SHOULDER ARTHROPLASTY: SHX5054

## 2016-11-16 LAB — GLUCOSE, CAPILLARY
Glucose-Capillary: 94 mg/dL (ref 65–99)
Glucose-Capillary: 98 mg/dL (ref 65–99)
Glucose-Capillary: 135 mg/dL — ABNORMAL HIGH (ref 65–99)

## 2016-11-16 SURGERY — ARTHROPLASTY, SHOULDER, TOTAL, REVERSE
Anesthesia: General | Site: Shoulder | Laterality: Left

## 2016-11-16 MED ORDER — CHLORHEXIDINE GLUCONATE 4 % EX LIQD: 60.0000 mL | Freq: Once | CUTANEOUS | Status: DC

## 2016-11-16 MED ORDER — POLYETHYLENE GLYCOL 3350 17 G PO PACK: 17.0000 g | PACK | Freq: Every day | ORAL | Status: DC | PRN

## 2016-11-16 MED ORDER — FENTANYL CITRATE (PF) 100 MCG/2ML IJ SOLN
INTRAMUSCULAR | Status: DC | PRN
  Administered 2016-11-16 (×6): 25 ug via INTRAVENOUS
  Administered 2016-11-16 (×2): 50 ug via INTRAVENOUS

## 2016-11-16 MED ORDER — CEFAZOLIN SODIUM-DEXTROSE 2-4 GM/100ML-% IV SOLN
INTRAVENOUS | Status: AC
  Filled 2016-11-16: qty 100

## 2016-11-16 MED ORDER — METOCLOPRAMIDE HCL 5 MG/ML IJ SOLN: 5.0000 mg | Freq: Three times a day (TID) | INTRAMUSCULAR | Status: DC | PRN

## 2016-11-16 MED ORDER — ACETAMINOPHEN 650 MG RE SUPP: 650.0000 mg | RECTAL | Status: DC | PRN

## 2016-11-16 MED ORDER — ROCURONIUM BROMIDE 10 MG/ML (PF) SYRINGE
PREFILLED_SYRINGE | INTRAVENOUS | Status: DC | PRN
  Administered 2016-11-16: 15 mg via INTRAVENOUS

## 2016-11-16 MED ORDER — OXYCODONE-ACETAMINOPHEN 5-325 MG PO TABS: 1.0000 | ORAL_TABLET | ORAL | 0 refills | Status: DC | PRN

## 2016-11-16 MED ORDER — METOCLOPRAMIDE HCL 5 MG PO TABS: 5.0000 mg | ORAL_TABLET | Freq: Three times a day (TID) | ORAL | Status: DC | PRN

## 2016-11-16 MED ORDER — GLYCOPYRROLATE 0.2 MG/ML IV SOSY
PREFILLED_SYRINGE | INTRAVENOUS | Status: DC | PRN
  Administered 2016-11-16: .2 mg via INTRAVENOUS

## 2016-11-16 MED ORDER — INSULIN ASPART 100 UNIT/ML ~~LOC~~ SOLN: 0.0000 [IU] | Freq: Every day | SUBCUTANEOUS | Status: DC

## 2016-11-16 MED ORDER — ADULT MULTIVITAMIN W/MINERALS CH
1.0000 | ORAL_TABLET | Freq: Every day | ORAL | Status: DC
  Administered 2016-11-16 – 2016-11-17 (×2): 1 via ORAL
  Filled 2016-11-16 (×4): qty 1

## 2016-11-16 MED ORDER — VITAMIN D 1000 UNITS PO TABS
1000.0000 [IU] | ORAL_TABLET | Freq: Every day | ORAL | Status: DC
  Administered 2016-11-16: 1000 [IU] via ORAL
  Filled 2016-11-16: qty 1

## 2016-11-16 MED ORDER — SERTRALINE HCL 50 MG PO TABS
50.0000 mg | ORAL_TABLET | Freq: Every day | ORAL | Status: DC
  Administered 2016-11-17: 50 mg via ORAL
  Filled 2016-11-16: qty 1

## 2016-11-16 MED ORDER — ZINC 50 MG PO CAPS: 50.0000 mg | ORAL_CAPSULE | Freq: Every day | ORAL | Status: DC

## 2016-11-16 MED ORDER — TRAMADOL HCL 50 MG PO TABS: 50.0000 mg | ORAL_TABLET | Freq: Two times a day (BID) | ORAL | Status: DC | PRN

## 2016-11-16 MED ORDER — ONDANSETRON HCL 4 MG/2ML IJ SOLN: 4.0000 mg | Freq: Four times a day (QID) | INTRAMUSCULAR | Status: DC | PRN

## 2016-11-16 MED ORDER — MONTELUKAST SODIUM 10 MG PO TABS
10.0000 mg | ORAL_TABLET | Freq: Every day | ORAL | Status: DC
  Administered 2016-11-16: 10 mg via ORAL
  Filled 2016-11-16 (×2): qty 1

## 2016-11-16 MED ORDER — ASPIRIN 325 MG PO TABS
325.0000 mg | ORAL_TABLET | Freq: Every day | ORAL | Status: DC
  Filled 2016-11-16 (×2): qty 1

## 2016-11-16 MED ORDER — MENTHOL 3 MG MT LOZG
1.0000 | LOZENGE | OROMUCOSAL | Status: DC | PRN
Start: 2016-11-16 — End: 2016-11-17

## 2016-11-16 MED ORDER — LACTATED RINGERS IV SOLN
INTRAVENOUS | Status: DC
  Administered 2016-11-16: 13:00:00 via INTRAVENOUS

## 2016-11-16 MED ORDER — ONDANSETRON HCL 4 MG/2ML IJ SOLN
INTRAMUSCULAR | Status: DC | PRN
  Administered 2016-11-16: 4 mg via INTRAVENOUS

## 2016-11-16 MED ORDER — ATORVASTATIN CALCIUM 20 MG PO TABS
40.0000 mg | ORAL_TABLET | Freq: Every day | ORAL | Status: DC
  Administered 2016-11-16: 40 mg via ORAL
  Filled 2016-11-16: qty 2

## 2016-11-16 MED ORDER — ACETAMINOPHEN 325 MG PO TABS: 650.0000 mg | ORAL_TABLET | Freq: Three times a day (TID) | ORAL | Status: DC | PRN

## 2016-11-16 MED ORDER — INSULIN ASPART 100 UNIT/ML ~~LOC~~ SOLN: 3.0000 [IU] | Freq: Three times a day (TID) | SUBCUTANEOUS | Status: DC

## 2016-11-16 MED ORDER — MORPHINE SULFATE (PF) 4 MG/ML IV SOLN
2.0000 mg | INTRAVENOUS | Status: DC | PRN
  Administered 2016-11-16: 2 mg via INTRAVENOUS
  Filled 2016-11-16: qty 1

## 2016-11-16 MED ORDER — SODIUM CHLORIDE 0.9 % IR SOLN
Status: DC | PRN
  Administered 2016-11-16: 1000 mL

## 2016-11-16 MED ORDER — LIDOCAINE 2% (20 MG/ML) 5 ML SYRINGE
INTRAMUSCULAR | Status: DC | PRN
  Administered 2016-11-16: 80 mg via INTRAVENOUS

## 2016-11-16 MED ORDER — ACETAMINOPHEN 325 MG PO TABS
650.0000 mg | ORAL_TABLET | ORAL | Status: DC | PRN
  Administered 2016-11-16 – 2016-11-17 (×3): 650 mg via ORAL
  Filled 2016-11-16 (×3): qty 2

## 2016-11-16 MED ORDER — PHENOL 1.4 % MT LIQD: 1.0000 | OROMUCOSAL | Status: DC | PRN

## 2016-11-16 MED ORDER — PROPOFOL 10 MG/ML IV BOLUS
INTRAVENOUS | Status: DC | PRN
Start: 2016-11-16 — End: 2016-11-16
  Administered 2016-11-16: 100 mg via INTRAVENOUS

## 2016-11-16 MED ORDER — PHENYLEPHRINE 40 MCG/ML (10ML) SYRINGE FOR IV PUSH (FOR BLOOD PRESSURE SUPPORT)
PREFILLED_SYRINGE | INTRAVENOUS | Status: DC | PRN
  Administered 2016-11-16: 40 ug via INTRAVENOUS

## 2016-11-16 MED ORDER — INOSITOL NIACINATE 500 MG PO CAPS: 500.0000 mg | ORAL_CAPSULE | Freq: Every day | ORAL | Status: DC

## 2016-11-16 MED ORDER — VITAMIN C 500 MG PO TABS
500.0000 mg | ORAL_TABLET | Freq: Two times a day (BID) | ORAL | Status: DC
  Filled 2016-11-16 (×2): qty 1

## 2016-11-16 MED ORDER — SUGAMMADEX SODIUM 200 MG/2ML IV SOLN
INTRAVENOUS | Status: DC | PRN
  Administered 2016-11-16: 100 mg via INTRAVENOUS

## 2016-11-16 MED ORDER — DEXAMETHASONE SODIUM PHOSPHATE 10 MG/ML IJ SOLN
INTRAMUSCULAR | Status: DC | PRN
  Administered 2016-11-16: 10 mg via INTRAVENOUS

## 2016-11-16 MED ORDER — METFORMIN HCL ER 500 MG PO TB24
500.0000 mg | ORAL_TABLET | Freq: Two times a day (BID) | ORAL | Status: DC
  Administered 2016-11-16 – 2016-11-17 (×2): 500 mg via ORAL
  Filled 2016-11-16 (×2): qty 1

## 2016-11-16 MED ORDER — FENTANYL CITRATE (PF) 100 MCG/2ML IJ SOLN
INTRAMUSCULAR | Status: AC
  Filled 2016-11-16: qty 2

## 2016-11-16 MED ORDER — PROPOFOL 10 MG/ML IV BOLUS
INTRAVENOUS | Status: AC
  Filled 2016-11-16: qty 20

## 2016-11-16 MED ORDER — OXYCODONE HCL 5 MG PO TABS
ORAL_TABLET | ORAL | Status: AC
  Filled 2016-11-16: qty 1

## 2016-11-16 MED ORDER — SODIUM CHLORIDE 0.9 % IV SOLN: INTRAVENOUS | Status: DC

## 2016-11-16 MED ORDER — GABAPENTIN 300 MG PO CAPS
600.0000 mg | ORAL_CAPSULE | Freq: Every day | ORAL | Status: DC
  Administered 2016-11-16: 600 mg via ORAL
  Filled 2016-11-16: qty 2

## 2016-11-16 MED ORDER — LIDOCAINE 2% (20 MG/ML) 5 ML SYRINGE
INTRAMUSCULAR | Status: AC
  Filled 2016-11-16: qty 10

## 2016-11-16 MED ORDER — FERROUS SULFATE 325 (65 FE) MG PO TABS
325.0000 mg | ORAL_TABLET | Freq: Two times a day (BID) | ORAL | Status: DC
  Administered 2016-11-16 – 2016-11-17 (×2): 325 mg via ORAL
  Filled 2016-11-16 (×2): qty 1

## 2016-11-16 MED ORDER — ROCURONIUM BROMIDE 10 MG/ML (PF) SYRINGE
PREFILLED_SYRINGE | INTRAVENOUS | Status: AC
  Filled 2016-11-16: qty 5

## 2016-11-16 MED ORDER — INSULIN ASPART 100 UNIT/ML ~~LOC~~ SOLN: 0.0000 [IU] | Freq: Three times a day (TID) | SUBCUTANEOUS | Status: DC

## 2016-11-16 MED ORDER — ONDANSETRON HCL 4 MG PO TABS: 4.0000 mg | ORAL_TABLET | Freq: Four times a day (QID) | ORAL | Status: DC | PRN

## 2016-11-16 MED ORDER — FENTANYL CITRATE (PF) 100 MCG/2ML IJ SOLN
25.0000 ug | INTRAMUSCULAR | Status: DC | PRN
  Administered 2016-11-16 (×3): 50 ug via INTRAVENOUS

## 2016-11-16 MED ORDER — CEFAZOLIN SODIUM-DEXTROSE 2-4 GM/100ML-% IV SOLN
2.0000 g | INTRAVENOUS | Status: AC
  Administered 2016-11-16: 2 g via INTRAVENOUS

## 2016-11-16 MED ORDER — PHENYLEPHRINE HCL 10 MG/ML IJ SOLN
INTRAMUSCULAR | Status: DC | PRN
  Administered 2016-11-16: 25 ug/min via INTRAVENOUS

## 2016-11-16 MED ORDER — CEFAZOLIN SODIUM-DEXTROSE 2-4 GM/100ML-% IV SOLN
2.0000 g | Freq: Four times a day (QID) | INTRAVENOUS | Status: DC
  Administered 2016-11-16 – 2016-11-17 (×2): 2 g via INTRAVENOUS
  Filled 2016-11-16 (×2): qty 100

## 2016-11-16 MED ORDER — FENTANYL CITRATE (PF) 250 MCG/5ML IJ SOLN
INTRAMUSCULAR | Status: AC
  Filled 2016-11-16: qty 5

## 2016-11-16 MED ORDER — BUPIVACAINE-EPINEPHRINE 0.25% -1:200000 IJ SOLN
INTRAMUSCULAR | Status: DC | PRN
  Administered 2016-11-16: 8 mL

## 2016-11-16 MED ORDER — BUPIVACAINE-EPINEPHRINE (PF) 0.25% -1:200000 IJ SOLN
INTRAMUSCULAR | Status: AC
  Filled 2016-11-16: qty 30

## 2016-11-16 MED ORDER — DOCUSATE SODIUM 100 MG PO CAPS
100.0000 mg | ORAL_CAPSULE | Freq: Two times a day (BID) | ORAL | Status: DC
  Administered 2016-11-16 – 2016-11-17 (×2): 100 mg via ORAL
  Filled 2016-11-16 (×2): qty 1

## 2016-11-16 MED ORDER — OXYCODONE HCL 5 MG PO TABS
5.0000 mg | ORAL_TABLET | ORAL | Status: DC | PRN
  Administered 2016-11-16 – 2016-11-17 (×5): 5 mg via ORAL
  Filled 2016-11-16 (×4): qty 1

## 2016-11-16 SURGICAL SUPPLY — 59 items
BIT DRILL 170X2.5X (BIT) IMPLANT
BIT DRILL 5/64X5 DISP (BIT) ×2 IMPLANT
BIT DRL 170X2.5X (BIT)
BLADE SAG 18X100X1.27 (BLADE) ×2 IMPLANT
CAPT SHLDR REVTOTAL 1 ×2 IMPLANT
CLSR STERI-STRIP ANTIMIC 1/2X4 (GAUZE/BANDAGES/DRESSINGS) ×2 IMPLANT
COVER SURGICAL LIGHT HANDLE (MISCELLANEOUS) ×2 IMPLANT
DRAPE INCISE IOBAN 66X45 STRL (DRAPES) ×2 IMPLANT
DRAPE ORTHO SPLIT 77X108 STRL (DRAPES) ×2
DRAPE SURG ORHT 6 SPLT 77X108 (DRAPES) ×2 IMPLANT
DRAPE U-SHAPE 47X51 STRL (DRAPES) ×2 IMPLANT
DRILL 2.5 (BIT)
DRSG ADAPTIC 3X8 NADH LF (GAUZE/BANDAGES/DRESSINGS) ×2 IMPLANT
DRSG PAD ABDOMINAL 8X10 ST (GAUZE/BANDAGES/DRESSINGS) ×4 IMPLANT
DURAPREP 26ML APPLICATOR (WOUND CARE) ×2 IMPLANT
ELECT BLADE 4.0 EZ CLEAN MEGAD (MISCELLANEOUS) ×2
ELECT NEEDLE TIP 2.8 STRL (NEEDLE) ×2 IMPLANT
ELECT REM PT RETURN 9FT ADLT (ELECTROSURGICAL) ×2
ELECTRODE BLDE 4.0 EZ CLN MEGD (MISCELLANEOUS) ×1 IMPLANT
ELECTRODE REM PT RTRN 9FT ADLT (ELECTROSURGICAL) ×1 IMPLANT
GAUZE SPONGE 4X4 12PLY STRL (GAUZE/BANDAGES/DRESSINGS) ×2 IMPLANT
GLOVE BIOGEL PI ORTHO PRO 7.5 (GLOVE) ×1
GLOVE BIOGEL PI ORTHO PRO SZ8 (GLOVE) ×1
GLOVE ORTHO TXT STRL SZ7.5 (GLOVE) ×2 IMPLANT
GLOVE PI ORTHO PRO STRL 7.5 (GLOVE) ×1 IMPLANT
GLOVE PI ORTHO PRO STRL SZ8 (GLOVE) ×1 IMPLANT
GLOVE SURG ORTHO 8.5 STRL (GLOVE) ×2 IMPLANT
GOWN STRL REUS W/ TWL LRG LVL3 (GOWN DISPOSABLE) ×1 IMPLANT
GOWN STRL REUS W/ TWL XL LVL3 (GOWN DISPOSABLE) ×2 IMPLANT
GOWN STRL REUS W/TWL LRG LVL3 (GOWN DISPOSABLE) ×1
GOWN STRL REUS W/TWL XL LVL3 (GOWN DISPOSABLE) ×2
KIT BASIN OR (CUSTOM PROCEDURE TRAY) ×2 IMPLANT
KIT ROOM TURNOVER OR (KITS) ×2 IMPLANT
MANIFOLD NEPTUNE II (INSTRUMENTS) ×2 IMPLANT
NEEDLE 1/2 CIR MAYO (NEEDLE) ×2 IMPLANT
NEEDLE HYPO 25GX1X1/2 BEV (NEEDLE) ×2 IMPLANT
NS IRRIG 1000ML POUR BTL (IV SOLUTION) ×2 IMPLANT
PACK SHOULDER (CUSTOM PROCEDURE TRAY) ×2 IMPLANT
PAD ABD 8X10 STRL (GAUZE/BANDAGES/DRESSINGS) ×4 IMPLANT
PAD ARMBOARD 7.5X6 YLW CONV (MISCELLANEOUS) ×4 IMPLANT
PIN METAGLENE 2.5 (PIN) IMPLANT
SLING ARM IMMOBILIZER LRG (SOFTGOODS) ×2 IMPLANT
SPONGE LAP 18X18 X RAY DECT (DISPOSABLE) IMPLANT
SPONGE LAP 4X18 X RAY DECT (DISPOSABLE) ×2 IMPLANT
STRIP CLOSURE SKIN 1/2X4 (GAUZE/BANDAGES/DRESSINGS) ×2 IMPLANT
SUCTION FRAZIER HANDLE 10FR (MISCELLANEOUS) ×1
SUCTION TUBE FRAZIER 10FR DISP (MISCELLANEOUS) ×1 IMPLANT
SUT FIBERWIRE #2 38 T-5 BLUE (SUTURE) ×4
SUT MNCRL AB 4-0 PS2 18 (SUTURE) ×2 IMPLANT
SUT VIC AB 0 CT2 27 (SUTURE) ×2 IMPLANT
SUT VIC AB 2-0 CT1 27 (SUTURE) ×1
SUT VIC AB 2-0 CT1 TAPERPNT 27 (SUTURE) ×1 IMPLANT
SUT VICRYL 0 CT 1 36IN (SUTURE) ×2 IMPLANT
SUTURE FIBERWR #2 38 T-5 BLUE (SUTURE) ×2 IMPLANT
SYR CONTROL 10ML LL (SYRINGE) ×2 IMPLANT
TOWEL OR 17X24 6PK STRL BLUE (TOWEL DISPOSABLE) ×2 IMPLANT
TOWEL OR 17X26 10 PK STRL BLUE (TOWEL DISPOSABLE) ×2 IMPLANT
TOWER CARTRIDGE SMART MIX (DISPOSABLE) IMPLANT
YANKAUER SUCT BULB TIP NO VENT (SUCTIONS) ×2 IMPLANT

## 2016-11-16 NOTE — Anesthesia Procedure Notes (Signed)
Procedure Name: Intubation Date/Time: 11/16/2016 3:04 PM Performed by: Elliot DallyHuggins, Treasure Ingrum, CRNA Pre-anesthesia Checklist: Patient identified, Emergency Drugs available, Suction available and Patient being monitored Patient Re-evaluated:Patient Re-evaluated prior to induction Oxygen Delivery Method: Circle System Utilized Preoxygenation: Pre-oxygenation with 100% oxygen Induction Type: IV induction Ventilation: Mask ventilation without difficulty Laryngoscope Size: Miller and 2 Grade View: Grade I Tube type: Oral Tube size: 7.5 mm Number of attempts: 1 Airway Equipment and Method: Stylet and Oral airway Placement Confirmation: ETT inserted through vocal cords under direct vision,  positive ETCO2 and breath sounds checked- equal and bilateral Secured at: 22 cm Tube secured with: Tape Dental Injury: Teeth and Oropharynx as per pre-operative assessment

## 2016-11-16 NOTE — Anesthesia Postprocedure Evaluation (Signed)
Anesthesia Post Note  Patient: David GottronFrank J [name]  Procedure(s) Performed: LEFT REVERSE SHOULDER ARTHROPLASTY (Left Shoulder)     Patient location during evaluation: PACU Anesthesia Type: General Level of consciousness: awake Pain management: pain level controlled Vital Signs Assessment: post-procedure vital signs reviewed and stable Respiratory status: spontaneous breathing Cardiovascular status: stable Anesthetic complications: no    Last Vitals:  Vitals:   11/16/16 1714 11/16/16 1724  BP: (!) 122/99 134/85  Pulse: 82 84  Resp: 19 14  Temp:    SpO2: 100% 100%    Last Pain:  Vitals:   11/16/16 1734  TempSrc:   PainSc: 7                  [name]

## 2016-11-16 NOTE — Brief Op Note (Signed)
11/16/2016  4:30 PM  PATIENT:  Feliberto GottronFrank J Miotke  83 y.o. male  PRE-OPERATIVE DIAGNOSIS:  left shoulder osteoarthritis, rotator cuff tear arthropathy  POST-OPERATIVE DIAGNOSIS:  left shoulder osteoarthritis, rotator cuff tear arthropathy  PROCEDURE:  Procedure(s): LEFT REVERSE SHOULDER ARTHROPLASTY (Left)  DePuy Delta Xtend  SURGEON:  Surgeon(s) and Role:    Beverely Low* Joane Postel, MD - Primary  PHYSICIAN ASSISTANT:   ASSISTANTS: Thea Gisthomas B Dixon, PA-C   ANESTHESIA:   general  EBL:  300 mL   BLOOD ADMINISTERED:none  DRAINS: none   LOCAL MEDICATIONS USED:  MARCAINE     SPECIMEN:  No Specimen  DISPOSITION OF SPECIMEN:  N/A  COUNTS:  YES  TOURNIQUET:  * No tourniquets in log *  DICTATION: .Other Dictation: Dictation Number [id number]  PLAN OF CARE: Admit to inpatient   PATIENT DISPOSITION:  PACU - hemodynamically stable.   Delay start of Pharmacological VTE agent (>24hrs) due to surgical blood loss or risk of bleeding: not applicable

## 2016-11-16 NOTE — Anesthesia Preprocedure Evaluation (Addendum)
Anesthesia Evaluation  Patient identified by MRN, date of birth, ID band Patient awake    Reviewed: Allergy & Precautions, NPO status   Airway Mallampati: III   Neck ROM: Limited    Dental   Pulmonary shortness of breath, sleep apnea , COPD, former smoker,   Very limited pulm reserve   + decreased breath sounds      Cardiovascular hypertension, + CAD and + Past MI   Rhythm:Regular Rate:Normal  Cardiac consult noted   Neuro/Psych  Neuromuscular disease    GI/Hepatic   Endo/Other  diabetes  Renal/GU      Musculoskeletal  (+) Arthritis ,   Abdominal   Peds  Hematology   Anesthesia Other Findings   Reproductive/Obstetrics                            Anesthesia Physical Anesthesia Plan  ASA: IV  Anesthesia Plan: General   Post-op Pain Management:    Induction: Intravenous  PONV Risk Score and Plan: 1 and Treatment may vary due to age or medical condition and Ondansetron  Airway Management Planned: Oral ETT  Additional Equipment:   Intra-op Plan:   Post-operative Plan: Extubation in OR and Possible Post-op intubation/ventilation  Informed Consent:   Dental advisory given  Plan Discussed with:   Anesthesia Plan Comments: (Had reasonable discussion c pt and family regarding risks of anesthesia and postop ventilation)       Anesthesia Quick Evaluation

## 2016-11-16 NOTE — Progress Notes (Signed)
PHARMACIST - PHYSICIAN ORDER COMMUNICATION  CONCERNING: P&T Medication Policy on Herbal Medications  DESCRIPTION:  This patient's order for:  Zinc and Inositol  has been noted.  This product(s) is classified as an "herbal" or natural product. Due to a lack of definitive safety studies or FDA approval, nonstandard manufacturing practices, plus the potential risk of unknown drug-drug interactions while on inpatient medications, the Pharmacy and Therapeutics Committee does not permit the use of "herbal" or natural products of this type within [facility].   ACTION TAKEN: The pharmacy department is unable to verify this order at this time and your patient has been informed of this safety policy. Please reevaluate patient's clinical condition at discharge and address if the herbal or natural product(s) should be resumed at that time.   Louie CasaJennifer Khaleef Ruby, PharmD, BCPS 11/16/2016, 6:55 PM

## 2016-11-16 NOTE — Transfer of Care (Signed)
Immediate Anesthesia Transfer of Care Note  Patient: [name]  Procedure(s) Performed: LEFT REVERSE SHOULDER ARTHROPLASTY (Left Shoulder)  Patient Location: PACU  Anesthesia Type:General  Level of Consciousness: awake, alert  and confused  Airway & Oxygen Therapy: Patient Spontanous Breathing and Patient connected to nasal cannula oxygen  Post-op Assessment: Report given to RN and Post -op Vital signs reviewed and stable  Post vital signs: Reviewed and stable  Last Vitals:  Vitals:   11/16/16 1312 11/16/16 1650  BP: (!) 151/46 136/80  Pulse: 60   Resp: 18 19  Temp: 36.6 C 36.4 C  SpO2: 99%     Last Pain:  Vitals:   11/16/16 1315  TempSrc:   PainSc: 9       Patients Stated Pain Goal: 9 (11/16/16 1315)  Complications: No apparent anesthesia complications

## 2016-11-16 NOTE — Interval H&P Note (Signed)
History and Physical Interval Note:  11/16/2016 2:21 PM  David GottronFrank J Hedtke  has presented today for surgery, with the diagnosis of left shoulder osteoarthritis, rotator cuff tear  The various methods of treatment have been discussed with the patient and family. After consideration of risks, benefits and other options for treatment, the patient has consented to  Procedure(s): LEFT REVERSE SHOULDER ARTHROPLASTY (Left) as a surgical intervention .  The patient's history has been reviewed, patient examined, no change in status, stable for surgery.  I have reviewed the patient's chart and labs.  Questions were answered to the patient's satisfaction.     Aniyla Harling,STEVEN R

## 2016-11-16 NOTE — Telephone Encounter (Signed)
I spoke with Samuel BoucheKrista Pressley - [facility], she sent a copy of Pt Intake Reimbursement form to my Email. [facility], clinical and demographics [facility].

## 2016-11-16 NOTE — Anesthesia Postprocedure Evaluation (Signed)
Anesthesia Post Note  Patient: [name]  Procedure(s) Performed: LEFT REVERSE SHOULDER ARTHROPLASTY (Left Shoulder)     Patient location during evaluation: PACU Anesthesia Type: General Level of consciousness: awake Pain management: pain level controlled Vital Signs Assessment: post-procedure vital signs reviewed and stable Respiratory status: spontaneous breathing Cardiovascular status: stable Anesthetic complications: no    Last Vitals:  Vitals:   11/16/16 1714 11/16/16 1724  BP: (!) 122/99 134/85  Pulse: 82 84  Resp: 19 14  Temp:    SpO2: 100% 100%    Last Pain:  Vitals:   11/16/16 1724  TempSrc:   PainSc: 7                  [place]

## 2016-11-16 NOTE — Plan of Care (Signed)
  Progressing Safety: Ability to remain free from injury will improve 11/16/2016 2233 - Progressing by Mel AlmondAguilar, Denman Pichardo D, RN Education: Knowledge of the prescribed therapeutic regimen will improve 11/16/2016 2233 - Progressing by Mel AlmondAguilar, Garey Alleva D, RN Understanding of activity limitations/precautions following surgery will improve 11/16/2016 2233 - Progressing by Mel AlmondAguilar, Paisely Brick D, RN Activity: Ability to tolerate increased activity will improve 11/16/2016 2233 - Progressing by Mel AlmondAguilar, Jerryl Holzhauer D, RN Pain Management: Pain level will decrease with appropriate interventions 11/16/2016 2233 - Progressing by Mel AlmondAguilar, Garrett Mitchum D, RN

## 2016-11-16 NOTE — Telephone Encounter (Signed)
-----   Message from Asencion Islamitorya Stover, [place] sent at 11/14/2016  1:18 PM EST ----- Regarding: [employer] Since they have closed case for [employer]. [facility]s [facility] 15mm disk they say reimbursement is better. Samuel BoucheKrista Pressley is the rep she said she will send you a insurance verification form -Dr. SKathie Rhodes

## 2016-11-16 NOTE — Discharge Instructions (Signed)
Ice to the shoulder as much as possible.  Keep the incision clean and dry and covered for one week, then ok to get it wet in the shower.  Please keep a pillow propped behind the left elbow to keep the arm across your body.  May remove the sling for light activity of daily living.  Do exercises as instructed.   Follow up in two weeks in the office [phone]

## 2016-11-17 LAB — GLUCOSE, CAPILLARY: GLUCOSE-CAPILLARY: 135 mg/dL — AB (ref 65–99)

## 2016-11-17 LAB — HEMOGLOBIN AND HEMATOCRIT, BLOOD
HEMATOCRIT: 33.8 % — AB (ref 39.0–52.0)
Hemoglobin: 10.9 g/dL — ABNORMAL LOW (ref 13.0–17.0)

## 2016-11-17 NOTE — Evaluation (Addendum)
Physical Therapy Evaluation Patient Details Name: David PaulsFrank J [name] MRN: [id number] DOB: 1931-02-27 Today's Date: 11/17/2016   History of Present Illness  Pt is an 83 y.o. male s/p L reverse TSA. 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  Clinical Impression  Pt presented sitting OOB in recliner chair, awake and willing to participate in therapy session. Pt's "special lady friend" and daughter present throughout as well. Daughter sitting in power w/c. Prior to admission, pt reported that he was independent with ADLs and used a SPC to ambulate but with history of frequent falls. Pt currently requires max A to perform sit<>stand transfer. Pt's daughter present and reported that being much more difficult than his baseline. Pt did not ambulate this session as he required max A to maintain upright standing and was not safe to ambulate without a second therapist or tech to assist. Pt very adamant to return home today, reporting that he will be fine despite his obvious functional limitations and deficits. Pt with very poor insight/awareness of safety and deficits. Pt currently at a high fall risk and has a history of falls. Pt would greatly benefit from further intensive therapy services at a Skilled Nursing Facility prior to returning home alone.  Pt would continue to benefit from skilled physical therapy services at this time while admitted and after d/c to address the below listed limitations in order to improve overall safety and independence with functional mobility.    Follow Up Recommendations SNF;Supervision/Assistance - 24 hour (if pt refuses, will need max Home Health services including HHPT, HHOT, and HH aide)    Equipment Recommendations  None recommended by PT    Recommendations for Other Services       Precautions / Restrictions Precautions Precautions: Fall;Shoulder Type of Shoulder Precautions:  Active protocol: AROM elbow/wrist/hand OK. A/PROM FF 90, ABD 60, ER 30 with ADL focus. Shoulder Interventions: Shoulder sling/immobilizer;For comfort(and sleep) Precaution Booklet Issued: Yes (comment) Required Braces or Orthoses: Other Brace/Splint Other Brace/Splint: sling, bil LE braces Restrictions Weight Bearing Restrictions: Yes LUE Weight Bearing: Non weight bearing      Mobility  Bed Mobility               General bed mobility comments: pt sitting OOB in recliner chair upon arrival  Transfers Overall transfer level: Needs assistance Equipment used: 1 person hand held assist Transfers: Sit to/from Stand Sit to Stand: Max assist         General transfer comment: max A to power into standing from sitting position in recliner chair. pt required cueing and reminders of NWB L UE. pt very unsteady upon standing  Ambulation/Gait             General Gait Details: not safe to ambulate with just one person. pt very unsteady in standing and requiring max A to maintain balance  Stairs            Wheelchair Mobility    Modified Rankin (Stroke Patients Only)       Balance Overall balance assessment: Needs assistance;History of Falls Sitting-balance support: Feet supported;No upper extremity supported Sitting balance-Leahy Scale: Good     Standing balance support: Single extremity supported Standing balance-Leahy Scale: Poor Standing balance comment: max A                             Pertinent Vitals/Pain Pain Assessment: 0-10 Pain Score: 8  Faces  Pain Scale: Hurts little more Pain Location: L shoulder Pain Descriptors / Indicators: Sore Pain Intervention(s): Repositioned;Monitored during session    Home Living Family/patient expects to be discharged to:: Private residence Living Arrangements: Alone Available Help at Discharge: Family;Available PRN/intermittently Type of Home: Mobile home Home Access: Ramped entrance     Home  Layout: One level Home Equipment: Cane - single point;Walker - 2 wheels;Grab bars - tub/shower;Wheelchair - power;Electric scooter      Prior Function Level of Independence: Independent with assistive device(s)         Comments: pt reports use of cane for mobility PTA and independent with ADL. Hx of falls     Hand Dominance   Dominant Hand: Right    Extremity/Trunk Assessment   Upper Extremity Assessment Upper Extremity Assessment: Defer to OT evaluation LUE: Unable to fully assess due to immobilization    Lower Extremity Assessment Lower Extremity Assessment: Generalized weakness(bilateral LE orthotics/braces)    Cervical / Trunk Assessment Cervical / Trunk Assessment: Kyphotic  Communication   Communication: No difficulties  Cognition Arousal/Alertness: Awake/alert Behavior During Therapy: Impulsive Overall Cognitive Status: Impaired/Different from baseline Area of Impairment: Attention;Memory;Following commands;Safety/judgement;Awareness;Problem solving                   Current Attention Level: Sustained Memory: Decreased recall of precautions;Decreased short-term memory Following Commands: Follows one step commands consistently Safety/Judgement: Decreased awareness of safety;Decreased awareness of deficits Awareness: Intellectual Problem Solving: Difficulty sequencing;Requires verbal cues General Comments: Pt with poor awareness of safety and deficits; feels he can manage at home despite apparent deficits with mobility and ADL      General Comments      Exercises     Assessment/Plan    PT Assessment Patient needs continued PT services  PT Problem List Decreased strength;Decreased activity tolerance;Decreased balance;Decreased mobility;Decreased coordination;Decreased cognition;Decreased knowledge of use of DME;Decreased safety awareness;Decreased knowledge of precautions;Pain       PT Treatment Interventions DME instruction;Gait training;Stair  training;Functional mobility training;Therapeutic activities;Therapeutic exercise;Balance training;Neuromuscular re-education;Cognitive remediation;Patient/family education    PT Goals (Current goals can be found in the Care Plan section)  Acute Rehab PT Goals Patient Stated Goal: return home PT Goal Formulation: With patient/family Time For Goal Achievement: 12/01/16 Potential to Achieve Goals: Fair    Frequency Min 3X/week   Barriers to discharge Decreased caregiver support      Co-evaluation               AM-PAC PT "6 Clicks" Daily Activity  Outcome Measure Difficulty turning over in bed (including adjusting bedclothes, sheets and blankets)?: Unable Difficulty moving from lying on back to sitting on the side of the bed? : Unable Difficulty sitting down on and standing up from a chair with arms (e.g., wheelchair, bedside commode, etc,.)?: Unable Help needed moving to and from a bed to chair (including a wheelchair)?: A Lot Help needed walking in hospital room?: A Lot Help needed climbing 3-5 steps with a railing? : Total 6 Click Score: 8    End of Session Equipment Utilized During Treatment: Gait belt Activity Tolerance: Patient limited by fatigue;Patient limited by pain Patient left: in chair;with call bell/phone within reach;with family/visitor present Nurse Communication: Mobility status PT Visit Diagnosis: Other abnormalities of gait and mobility (R26.89);History of falling (Z91.81);Pain Pain - Right/Left: Left Pain - part of body: Shoulder    Time: 1610-9604 PT Time Calculation (min) (ACUTE ONLY): 14 min   Charges:   PT Evaluation $PT Eval Moderate Complexity: 1 Mod  PT G Codes:        UphamJennifer Zasha [name], [place], [place] [phone]   [name] 11/17/2016, 10:08 AM

## 2016-11-17 NOTE — Progress Notes (Signed)
Subjective: 1 Day Post-Op Procedure(s) (LRB): LEFT REVERSE SHOULDER ARTHROPLASTY (Left) Patient reports pain as mild.  Controlled with oral pain meds.  Eager to go home.    Objective: Vital signs in last 24 hours: Temp:  [97.6 F (36.4 C)-98.7 F (37.1 C)] 98.5 F (36.9 C) (11/17 0735) Pulse Rate:  [60-112] 84 (11/17 0735) Resp:  [14-21] 18 (11/17 0735) BP: (92-157)/(46-99) 92/69 (11/17 0735) SpO2:  [83 %-100 %] 94 % (11/17 0735) Weight:  [82.6 kg (182 lb)] 82.6 kg (182 lb) (11/16 1312)  Intake/Output from previous day: 11/16 0701 - 11/17 0700 In: 1300 [I.V.:1300] Out: 575 [Urine:275; Blood:300] Intake/Output this shift: No intake/output data recorded.  Recent Labs    11/14/16 1458 11/17/16 0604  HGB 11.7* 10.9*   Recent Labs    11/14/16 1458 11/17/16 0604  WBC 4.4  --   RBC 3.97*  --   HCT 36.4* 33.8*  PLT 173  --    Recent Labs    11/14/16 1458  NA 135  K 4.7  CL 102  CO2 26  BUN 21*  CREATININE 0.72  GLUCOSE 103*  CALCIUM 9.2   No results for input(s): LABPT, INR in the last 72 hours.  PE:  L UE immobilized in sling.  L shoulder dressed and dry.  2+ radial pulse.  Neuro exam at baseline.  Assessment/Plan: 1 Day Post-Op Procedure(s) (LRB): LEFT REVERSE SHOULDER ARTHROPLASTY (Left) Discharge home with home health  Toni ArthursHEWITT, Jiaire Rosebrook 11/17/2016, 9:46 AM

## 2016-11-17 NOTE — Care Management Note (Signed)
Case Management Note  Patient Details  Name: [name] MRN: [id number] Date of Birth: Nov 09, 1931  Subjective/Objective:                 Spoke with patient and family at the bedside, discussed HH. [facility] [address] Providers at those address could not take patient. Patient stated he was OK with any HH provider. [facility]. AVS updated. DME being supplied by nursing staff. CM signing off.   Action/Plan:   Expected Discharge Date:  11/17/16               Expected Discharge Plan:  Home w Home Health Services  In-House Referral:     Discharge planning Services  CM Consult  Post Acute Care Choice:  Home Health Choice offered to:  Patient  DME Arranged:    DME Agency:     HH Arranged:  PT, OT HH Agency:  [facility]  Status of Service:  Completed, signed off  If discussed at Long Length of Stay Meetings, dates discussed:    Additional Comments:  Lawerance SabalDebbie Maryem Shuffler, RN 11/17/2016, 10:11 AM

## 2016-11-17 NOTE — Progress Notes (Signed)
Patient placed on Home CPAP, tolerating well, will continue to monitor.

## 2016-11-17 NOTE — Progress Notes (Signed)
OT Treatment Note  Focus of session on ADL and safety education with pt and daughter. Daughter confirms pt requiring additional assist with ADL and functional mobility from baseline and verbalized understanding that pt will need 24/7 assist if he plans to d/c home. D/c plan remains appropriate. Will continue to follow acutely.   11/17/16 1100  OT Visit Information  Last OT Received On 11/17/16  Assistance Needed +1 (+2 for ambulation)  History of Present Illness Pt is an 83 y.o. male s/p L reverse TSA. 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  Precautions  Precautions Fall;Shoulder  Type of Shoulder Precautions Active protocol: AROM elbow/wrist/hand OK. A/PROM FF 90, ABD 60, ER 30 with ADL focus.  Shoulder Interventions Shoulder sling/immobilizer;For comfort (and sleep)  Precaution Comments Educated pt and family on precautions  Required Braces or Orthoses Other Brace/Splint  Other Brace/Splint sling, bil LE braces  Pain Assessment  Pain Assessment 0-10  Faces Pain Scale 6  Pain Location L shoulder  Pain Descriptors / Indicators Sore  Pain Intervention(s) Limited activity within patient's tolerance;Monitored during session  Cognition  Arousal/Alertness Awake/alert  Behavior During Therapy Impulsive  Overall Cognitive Status Impaired/Different from baseline  Area of Impairment Attention;Memory;Following commands;Safety/judgement;Awareness;Problem solving  Current Attention Level Sustained  Memory Decreased recall of precautions;Decreased short-term memory  Following Commands Follows one step commands consistently  Safety/Judgement Decreased awareness of safety;Decreased awareness of deficits  Awareness Intellectual  Problem Solving Difficulty sequencing;Requires verbal cues  ADL  Overall ADL's  Needs assistance/impaired  Upper Body Bathing Details (indicate cue type and reason) Educated pt  and daughter on compensatory strategies for UB bathing.  Upper Body Dressing Details (indicate cue type and reason) Educated pt and daughter on compensatory strategies for UB dressing and sling management and wear schedule  Tub/Shower Transfer Details (indicate cue type and reason) Educated pt and daugher on use of 3 in 1 in shower as a seat; recommend for fall prevention and safety.  General ADL Comments Educated pt and daughter on LUE NWB, proper positioning in bed/chair, elbow/wrist/hand ROM, and home safety/fall prevention strategies. Pt and daughter verbalize understanding and daughter confirms need for 24/7 supervision.  Bed Mobility  General bed mobility comments Pt OOB in chair  Balance  Overall balance assessment Needs assistance;History of Falls  Sitting-balance support Feet supported;Single extremity supported  Sitting balance-Leahy Scale Good  Restrictions  Weight Bearing Restrictions Yes  LUE Weight Bearing NWB  Exercises  Exercises Shoulder  Shoulder Instructions  Donning/doffing shirt without moving shoulder Maximal assistance;Caregiver independent with task  Method for sponge bathing under operated UE Maximal assistance;Caregiver independent with task  Donning/doffing sling/immobilizer Maximal assistance;Caregiver independent with task  Correct positioning of sling/immobilizer Maximal assistance;Caregiver independent with task  ROM for elbow, wrist and digits of operated UE Maximal assistance;Caregiver independent with task  Sling wearing schedule (on at all times/off for ADL's) Maximal assistance;Caregiver independent with task  Proper positioning of operated UE when showering Maximal assistance;Caregiver independent with task  Positioning of UE while sleeping Maximal assistance;Caregiver independent with task  OT - End of Session  Equipment Utilized During Treatment Other (comment) (sling)  Activity Tolerance Patient tolerated treatment well  Patient left in chair;with  family/visitor present  OT Assessment/Plan  OT Plan Discharge plan remains appropriate  OT Visit Diagnosis Unsteadiness on feet (R26.81);Other abnormalities of gait and mobility (R26.89);History of falling (Z91.81);Muscle weakness (generalized) (M62.81);Pain  Pain - Right/Left Left  Pain - part of body Shoulder  OT Frequency (ACUTE ONLY) Min 3X/week  Follow Up Recommendations Supervision/Assistance - 24 hour;SNF (if pt refuses will need HHOT and HH aide)  OT Equipment 3 in 1 bedside commode  AM-PAC OT "6 Clicks" Daily Activity Outcome Measure  Help from another person eating meals? 3  Help from another person taking care of personal grooming? 3  Help from another person toileting, which includes using toliet, bedpan, or urinal? 2  Help from another person bathing (including washing, rinsing, drying)? 2  Help from another person to put on and taking off regular upper body clothing? 2  Help from another person to put on and taking off regular lower body clothing? 2  6 Click Score 14  ADL G Code Conversion CK  OT Goal Progression  Progress towards OT goals Progressing toward goals  Acute Rehab OT Goals  Patient Stated Goal return home  OT Goal Formulation With patient/family  OT Time Calculation  OT Start Time (ACUTE ONLY) 0959  OT Stop Time (ACUTE ONLY) 1011  OT Time Calculation (min) 12 min  OT General Charges  $OT Visit 1 Visit  OT Treatments  $Self Care/Home Management  8-22 mins   Haydan Wedig A. Brett Albinooffey, M.S., OTR/L Pager: [phone]

## 2016-11-17 NOTE — Progress Notes (Signed)
Patient is discharged from room 3C10 at this time. Alert and in stable condition. IV site d/c'd and instructions given to patient and family with understanding verbalized. Left unit via wheelchair with all belongings at side.

## 2016-11-17 NOTE — Evaluation (Addendum)
Occupational Therapy Evaluation Patient Details Name: [name] MRN: [id number] DOB: [birth year] Today's Date: 11/17/2016    History of Present Illness Pt is an 83 y.o. male s/p L reverse TSA. 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   Clinical Impression   Pt reports he was managing ADL independently PTA (no family present to confirm PLOF). Currently pt requires mod-max assist for ADL and mod assist for functional mobility with use of cane. Pt with apparent cognitive deficits; unable to recall precautions and with poor safety awareness/awareness of deficits--reporting he feels he can manage at home despite level of assist provided by therapist this session. No family present to confirm pts functional baseline, however, at this time feel he is a high fall and safety risk. Pt reports hx of multiple falls with injury at home. Do not feel pt is safe to d/c home at this time, would recommend additional night stay in acute setting for continued shoulder/ADL education and additional mobility training. Recommending SNF for follow up to maximize independence and safety with ADL and functional mobility prior to return home alone. Pt would benefit from continued skilled OT to address established goals.    Follow Up Recommendations  Supervision/Assistance - 24 hour;SNF(if pt refuses will need HHOT and HH aide)    Equipment Recommendations  3 in 1 bedside commode    Recommendations for Other Services PT consult     Precautions / Restrictions Precautions Precautions: Fall;Shoulder Type of Shoulder Precautions: Active protocol: AROM elbow/wrist/hand OK. A/PROM FF 90, ABD 60, ER 30 with ADL focus. Shoulder Interventions: Shoulder sling/immobilizer;For comfort(and sleep) Precaution Booklet Issued: Yes (comment) Required Braces or Orthoses: Other Brace/Splint Other Brace/Splint: sling, bil LE  braces Restrictions Weight Bearing Restrictions: Yes LUE Weight Bearing: Non weight bearing      Mobility Bed Mobility               General bed mobility comments: Pt sitting EOB upon arrival  Transfers Overall transfer level: Needs assistance Equipment used: 1 person hand held assist Transfers: Sit to/from Stand Sit to Stand: Max assist         General transfer comment: Max assist to boost up from elevated EOB. Cues for hand placement and technique. Cues for LUE NWB     Balance Overall balance assessment: Needs assistance;History of Falls Sitting-balance support: Feet supported;No upper extremity supported Sitting balance-Leahy Scale: Good     Standing balance support: Single extremity supported Standing balance-Leahy Scale: Poor                             ADL either performed or assessed with clinical judgement   ADL Overall ADL's : Needs assistance/impaired Eating/Feeding: Minimal assistance;Sitting   Grooming: Minimal assistance;Sitting   Upper Body Bathing: Moderate assistance;Sitting   Lower Body Bathing: Maximal assistance;Sit to/from stand   Upper Body Dressing : Moderate assistance;Sitting Upper Body Dressing Details (indicate cue type and reason): for sling management Lower Body Dressing: Maximal assistance;Sit to/from stand Lower Body Dressing Details (indicate cue type and reason): for underwear, pants, and bil LE braces Toilet Transfer: Maximal assistance;Ambulation;BSC(cane) Toilet Transfer Details (indicate cue type and reason): Simulated by sit to stand from EOB with functional mobility in room. Max assist for sit to stand and mod assist for mobility         Functional mobility during ADLs: Moderate assistance;Cane General ADL Comments: Educated pt on LUE NWB,  sling management and wear.     Vision         Perception     Praxis      Pertinent Vitals/Pain Pain Assessment: Faces Faces Pain Scale: Hurts little  more Pain Location: L shoulder Pain Descriptors / Indicators: Sore Pain Intervention(s): Monitored during session;Limited activity within patient's tolerance;Repositioned     Hand Dominance Right   Extremity/Trunk Assessment Upper Extremity Assessment Upper Extremity Assessment: LUE deficits/detail LUE: Unable to fully assess due to immobilization   Lower Extremity Assessment Lower Extremity Assessment: Defer to PT evaluation   Cervical / Trunk Assessment Cervical / Trunk Assessment: Kyphotic   Communication Communication Communication: No difficulties   Cognition Arousal/Alertness: Awake/alert Behavior During Therapy: Impulsive Overall Cognitive Status: No family/caregiver present to determine baseline cognitive functioning Area of Impairment: Attention;Memory;Following commands;Safety/judgement;Problem solving                   Current Attention Level: Sustained Memory: Decreased recall of precautions;Decreased short-term memory Following Commands: Follows one step commands consistently Safety/Judgement: Decreased awareness of safety;Decreased awareness of deficits   Problem Solving: Requires verbal cues General Comments: Pt with poor awareness of safety and deficits; feels he can manage at home despite apparent deficits with mobility and ADL   General Comments       Exercises     Shoulder Instructions      Home Living Family/patient expects to be discharged to:: Private residence Living Arrangements: Alone Available Help at Discharge: Family;Available PRN/intermittently Type of Home: Mobile home Home Access: Ramped entrance     Home Layout: One level     Bathroom Shower/Tub: Producer, television/film/videoWalk-in shower   Bathroom Toilet: Standard     Home Equipment: Cane - single point;Walker - 2 wheels;Wheelchair - manual;Grab bars - tub/shower          Prior Functioning/Environment Level of Independence: Independent with assistive device(s)        Comments: pt  reports use of cane for mobility PTA and independent with ADL. Hx of falls        OT Problem List: Decreased strength;Decreased range of motion;Decreased activity tolerance;Impaired balance (sitting and/or standing);Decreased cognition;Decreased safety awareness;Decreased knowledge of use of DME or AE;Decreased knowledge of precautions;Impaired UE functional use;Pain;Increased edema      OT Treatment/Interventions: Self-care/ADL training;Therapeutic exercise;Energy conservation;DME and/or AE instruction;Therapeutic activities;Cognitive remediation/compensation;Patient/family education;Balance training    OT Goals(Current goals can be found in the care plan section) Acute Rehab OT Goals Patient Stated Goal: return home OT Goal Formulation: With patient Time For Goal Achievement: 12/01/16 Potential to Achieve Goals: Good ADL Goals Pt Will Perform Upper Body Bathing: with min assist;sitting Pt Will Perform Upper Body Dressing: with min assist;sitting Pt Will Transfer to Toilet: with min guard assist;ambulating;bedside commode Pt Will Perform Toileting - Clothing Manipulation and hygiene: with min guard assist;sit to/from stand Pt Will Perform Tub/Shower Transfer: Shower transfer;with min guard assist;ambulating;3 in 1;grab bars Additional ADL Goal #1: Pt/caregiver will don/doff sling with set up as precursor to ADL. Additional ADL Goal #2: Pt will independently verbally recall LUE NWB and maintain throughout ADL.  OT Frequency: Min 3X/week   Barriers to D/C: Decreased caregiver support          Co-evaluation              AM-PAC PT "6 Clicks" Daily Activity     Outcome Measure Help from another person eating meals?: A Little Help from another person taking care of personal grooming?: A Little Help from another person toileting,  which includes using toliet, bedpan, or urinal?: A Lot Help from another person bathing (including washing, rinsing, drying)?: A Lot Help from another  person to put on and taking off regular upper body clothing?: A Lot Help from another person to put on and taking off regular lower body clothing?: A Lot 6 Click Score: 14   End of Session Equipment Utilized During Treatment: Gait belt;Other (comment)(cane, sling) Nurse Communication: Mobility status;Other (comment)(equipment and f/u needs)  Activity Tolerance: Patient tolerated treatment well Patient left: in chair;with call bell/phone within reach  OT Visit Diagnosis: Unsteadiness on feet (R26.81);Other abnormalities of gait and mobility (R26.89);History of falling (Z91.81);Muscle weakness (generalized) (M62.81);Pain Pain - Right/Left: Left Pain - part of body: Shoulder                Time: [id number] OT Time Calculation (min): 23 min Charges:  OT General Charges $OT Visit: 1 Visit OT Evaluation $OT Eval Moderate Complexity: 1 Mod OT Treatments $Self Care/Home Management : 8-22 mins G-Codes:     Livana Yerian A. Brett Albinooffey, M.S., OTR/L Pager: [phone]  Gaye AlkenBailey A Travion Ke 11/17/2016, 9:57 AM

## 2016-11-19 ENCOUNTER — Encounter (HOSPITAL_COMMUNITY): Payer: Self-pay | Admitting: Orthopedic Surgery

## 2016-11-19 NOTE — Discharge Summary (Signed)
Physician Discharge Summary    Patient ID: [name] MRN: [id number] DOB/AGE: July 03, 1931 83 y.o.  Admit date: 11/16/2016 Discharge date: 11/17/2016   Procedures:  Procedure(s) (LRB): LEFT REVERSE SHOULDER ARTHROPLASTY (Left)  Attending Physician:  Dr. [name]  Admission Diagnoses:   Left shoulder cuff arthropathy  Consultants:  PT/OT  Discharge Diagnoses:  Active Problems:   S/P shoulder replacement, left  Past Medical History:  Diagnosis Date  . ANEMIA, IRON DEFICIENCY   . Arthritis   . BACK PAIN, LUMBAR   . Charcot-Marie-Tooth disease    assoiciated muscular dystrophy   . COPD (chronic obstructive pulmonary disease) (HCC)   . Coronary artery disease   . DIABETES MELLITUS, TYPE II   . GERD (gastroesophageal reflux disease)   . H/O: asbestos exposure   . Hypertension   . Leukopenia   . Myocardial infarction (HCC)   . Pernicious anemia   . Pneumonia   . Sleep apnea   . Smoker    quit 1968--25 ppy     PCP: Romero BellingEllison, Sean, MD   Discharged Condition: fair  Hospital Course:  Patient underwent the above stated procedure on 11/16/2016. Patient tolerated the procedure well and brought to the recovery room in good condition and subsequently to the floor. No complications during their hospital stay. Therapy recommended SNF placement but pt and family refused. No complicating wound issues during the hospital stay. Incision healing well and good early range of motion  Disposition: 01-Home or Self Care with follow up in 2 weeks  Medications: No current facility-administered medications for this encounter.    Current Outpatient Medications  Medication Sig Dispense Refill  . acetaminophen (TYLENOL) 500 MG tablet Take 1,000-1,500 mg 2 (two) times daily as needed by mouth for moderate pain.    . Ascorbic Acid (VITAMIN C) 500 MG tablet Take 500 mg by mouth 2 (two) times daily.     Marland Kitchen. aspirin (GOODSENSE ASPIRIN) 325 MG tablet Take 325 mg daily by mouth.     .  cholecalciferol (VITAMIN D) 1000 units tablet Take 1,000 Units at bedtime by mouth.    . Cyanocobalamin (B-12 PO) Take 1 tablet daily by mouth.    . ferrous sulfate 325 (65 FE) MG tablet Take 325 mg 2 (two) times daily by mouth.    . gabapentin (NEURONTIN) 300 MG capsule Take 2 capsules (600 mg total) by mouth at bedtime. 180 capsule 3  . Inositol Niacinate (NIACIN FLUSH FREE) 500 MG CAPS Take 500 mg at bedtime by mouth.     . metFORMIN (GLUCOPHAGE-XR) 500 MG 24 hr tablet Take 2 tablets (1,000 mg total) by mouth daily. (Patient taking differently: Take 500 mg 2 (two) times daily by mouth. ) 180 tablet 3  . montelukast (SINGULAIR) 10 MG tablet Take 1 tablet (10 mg total) by mouth at bedtime. 90 tablet 3  . Multiple Vitamins-Minerals (MULTIVITAMIN WITH MINERALS) tablet Take 1 tablet by mouth daily.      . sertraline (ZOLOFT) 50 MG tablet Take 1 tablet (50 mg total) by mouth daily. 90 tablet 3  . simvastatin (ZOCOR) 80 MG tablet Take 1 tablet (80 mg total) by mouth at bedtime. 90 tablet 3  . traMADol (ULTRAM) 50 MG tablet Take 1 tablet (50 mg total) by mouth every 12 (twelve) hours as needed. (Patient taking differently: Take 50 mg every 12 (twelve) hours as needed by mouth for moderate pain. ) 180 tablet 1  . Zinc 50 MG CAPS Take 50 mg at bedtime by mouth.     .Marland Kitchen  Blood Glucose Calibration (ADVOCATE REDI-CODE+ CONTROL) LOW SOLN   3  . Blood Glucose Monitoring Suppl (ADVOCATE REDI-CODE+) DEVI   1  . CLEVER CHEK AUTO-CODE VOICE test strip     . Cyanocobalamin (VITAMIN B-12 IJ) GET A B12 INJECTION ONCE EVERY MONTH     . Lancet Devices (SIMPLE DIAGNOSTICS LANCING DEV) MISC   1  . oxyCODONE-acetaminophen (ROXICET) 5-325 MG tablet Take 1 tablet every 4 (four) hours as needed by mouth for severe pain. 30 tablet 0    Follow-up Information    Beverely LowNorris, Steve, MD. Call in 2 week(s).   Specialty:  Orthopedic Surgery Why:  [phone] Contact information: [address] [place]  [id number] [phone]        [facility] Follow up.   Specialty:  Home Health Services Why:  For home health PT and OT. They will call in the next 1-2 days to set up your first home visit. Contact information: [address] [place] [address] [phone]           Discharge Instructions    Call MD / Call 911   Complete by:  As directed    If you experience chest pain or shortness of breath, CALL 911 and be transported to the hospital emergency room.  If you develope a fever above 101 F, pus (white drainage) or increased drainage or redness at the wound, or calf pain, call your surgeon's office.   Constipation Prevention   Complete by:  As directed    Drink plenty of fluids.  Prune juice may be helpful.  You may use a stool softener, such as Colace (over the counter) 100 mg twice a day.  Use MiraLax (over the counter) for constipation as needed.   Diet - low sodium heart healthy   Complete by:  As directed    Increase activity slowly as tolerated   Complete by:  As directed       Allergies as of 11/17/2016   No Known Allergies     Medication List    STOP taking these medications   clotrimazole-betamethasone cream Commonly known as:  LOTRISONE     TAKE these medications   ADVOCATE REDI-CODE+ CONTROL Low Soln   ADVOCATE REDI-CODE+ Devi   B-12 PO Take 1 tablet daily by mouth.   cholecalciferol 1000 units tablet Commonly known as:  VITAMIN D Take 1,000 Units at bedtime by mouth.   CLEVER CHEK AUTO-CODE VOICE test strip Generic drug:  glucose blood   ferrous sulfate 325 (65 FE) MG tablet Take 325 mg 2 (two) times daily by mouth.   gabapentin 300 MG capsule Commonly known as:  NEURONTIN Take 2 capsules (600 mg total) by mouth at bedtime.   GOODSENSE ASPIRIN 325 MG tablet Generic drug:  aspirin Take 325 mg daily by mouth.   metFORMIN 500 MG 24 hr tablet Commonly known as:  GLUCOPHAGE-XR Take 2 tablets (1,000 mg total) by  mouth daily. What changed:    how much to take  when to take this   montelukast 10 MG tablet Commonly known as:  SINGULAIR Take 1 tablet (10 mg total) by mouth at bedtime.   multivitamin with minerals tablet Take 1 tablet by mouth daily.   NIACIN FLUSH FREE 500 MG Caps Generic drug:  Inositol Niacinate Take 500 mg at bedtime by mouth.   oxyCODONE-acetaminophen 5-325 MG tablet Commonly known as:  ROXICET Take 1 tablet every 4 (four) hours as needed by mouth  for severe pain.   sertraline 50 MG tablet Commonly known as:  ZOLOFT Take 1 tablet (50 mg total) by mouth daily.   SIMPLE DIAGNOSTICS LANCING DEV Misc   simvastatin 80 MG tablet Commonly known as:  ZOCOR Take 1 tablet (80 mg total) by mouth at bedtime.   traMADol 50 MG tablet Commonly known as:  ULTRAM Take 1 tablet (50 mg total) by mouth every 12 (twelve) hours as needed. What changed:  reasons to take this   TYLENOL 500 MG tablet Generic drug:  acetaminophen Take 1,000-1,500 mg 2 (two) times daily as needed by mouth for moderate pain.   VITAMIN B-12 IJ GET A B12 INJECTION ONCE EVERY MONTH   vitamin C 500 MG tablet Commonly known as:  ASCORBIC ACID Take 500 mg by mouth 2 (two) times daily.   Zinc 50 MG Caps Take 50 mg at bedtime by mouth.            Durable Medical Equipment  (From admission, onward)        Start     Ordered   11/19/16 0919  For home use only DME 3 n 1  Once     11/19/16 0919      Signed: Thea Gist 11/19/2016, 1:28 PM

## 2016-11-19 NOTE — Op Note (Signed)
NAME:  [name], [name]                      ACCOUNT NO.:  MEDICAL RECORD NO.:  [id number]  LOCATION:                                 FACILITY:  PHYSICIAN:  Almedia BallsSteven R. Ranell PatrickNorris, M.D. DATE OF BIRTH:  20-Nov-1931  DATE OF PROCEDURE:  11/16/2016 DATE OF DISCHARGE:                              OPERATIVE REPORT   PREOPERATIVE DIAGNOSIS:  Left shoulder end-stage arthritis with rotator cuff tear arthropathy.  POSTOPERATIVE DIAGNOSIS:  Left shoulder end-stage arthritis with rotator cuff tear arthropathy.  PROCEDURE PERFORMED:  Left reverse shoulder replacement using DePuy Delta Xtend prosthesis.  ATTENDING SURGEON:  Almedia BallsSteven R. Ranell PatrickNorris, MD.  ASSISTANT:  Modesto Charonhomas Brad Dixon, [facility], who was scrubbed during the entire procedure and necessary for satisfactory completion of surgery.  ANESTHESIA:  General anesthesia was used plus local.  ESTIMATED BLOOD LOSS:  Less than 200 mL.  FLUID REPLACEMENT:  1200 mL of crystalloid.  INSTRUMENT COUNTS:  Correct.  COMPLICATIONS:  There were no complications.  Perioperative antibiotics were given.  INDICATIONS:  The patient is an 83 year old male with worsening left shoulder pain and dysfunction secondary to rotator cuff tear arthropathy.  The patient presents having failed conservative management, desiring operative treatment to restore function and eliminate pain in the shoulder.  Informed consent obtained.  DESCRIPTION OF PROCEDURE:  After an adequate level of anesthesia was achieved, the patient was positioned in the modified beach-chair position.  Left shoulder correctly identified and sterilely prepped in the usual manner.  Time-out was called.  We entered the shoulder using standard deltopectoral approach, starting at the coracoid process extending down to the anterior humerus.  Dissection down through subcutaneous tissues.  The cephalic vein was identified and taken laterally with the deltoid, pectoralis taken medially and the conjoint tendon  identified and retracted medially.  Subscapularis was absent. There was a pseudocapsule present and we released that.  The humerus came forward.  We did go ahead and tagged the pseudocapsule/subscap remnant to protect the axillary nerve.  We then released the remaining rotator cuff superiorly around to the teres minor, which we preserved. We removed the supraspinatus and infraspinatus that was remaining, was in poor condition.  We extended the shoulder, delivering the humerus out of the wound with external rotation.  We entered the proximal humerus with a 6-mm reamer and reamed up to a size 10-mm reamer.  We then placed our intramedullary head resection guide and then resected the head 10 degrees of retroversion with an oscillating saw.  We removed some small spurs that were present with a rongeur and then did our metaphyseal preparation with the appropriate sized reamer, which was the epi-1 left reamer.  Once we had that reamed to the appropriate depth, we impacted 10 stem with a size 1 left set on the 0 setting and placed in 10 degrees of retroversion.  Once that was impacted in place, we subluxed the humerus posteriorly, did a 360-degree capsule labral removal, careful to protect the axillary nerve.  Once we had good exposure of the glenoid, we drilled our central guide pin and then reamed for the metaglene.  We then drilled our central peg hole, did our peripheral  hand reaming and then impacted the metaglene into position, placed a 42 inferiorly, 42 at the base of the coracoid, and then 18 anteriorly for excellent purchase and security for the base plate.  We then selected a 38 eccentric glenosphere and dialed the eccentricity inferiorly.  We had excellent coverage of the inferior glenoid and it was stable.  Once we had that screwed in place, we irrigated thoroughly.  Did a final check to make sure there were no other loose bodies or remnants of the rotator cuff to remove.  Once that  was done, we reduced the shoulder with a 38/+3 poly and we thought we could go a little bit tighter as there was slight laxity with the elbow at the patient's side and some external rotation, some slight gapping anteriorly.  There was not a lot gapping with inferior pole, but more than external rotation created no gapping.  I did sweep my finger posteriorly to make sure there was not any soft tissue impingement posteriorly or tethering of the teres minor, which there was none.  We then trialed the 38/+6 and that actually fit perfectly.  We removed the trial components from the humerus.  We irrigated thoroughly.  We used impaction grafting technique with available bone graft and used the HA coated press-fit stem.  The 10 body and the size 1 left set on the 0 setting and placed in 10 degrees of retroversion.  Again using available bone graft, we impacted the stem into place, had excellent purchase and security.  We selected real 38/+3 poly, impacted that in place, reduced the shoulder.  Triceps was a little bit tight.  We chose not to release that keeping that for some stability help inferiorly, but the triceps was a little tight.  Conjoint tendon as soon as the arm was brought past the midline forward did relax and the axillary nerve was not under excessive tension.  We irrigated thoroughly, closed the deltopectoral interval with 0 Vicryl suture followed by 2-0 Vicryl for subcutaneous closure and 4-0 Monocryl for skin.  Steri-Strips and a shoulder sling applied and sterile bandage. The patient tolerated procedure well.     Almedia BallsSteven R. Ranell PatrickNorris, M.D.     SRN/MEDQ  D:  11/16/2016  T:  11/17/2016  Job:  [id number]

## 2016-11-22 ENCOUNTER — Other Ambulatory Visit: Payer: Self-pay | Admitting: Endocrinology

## 2016-11-28 ENCOUNTER — Telehealth: Payer: Self-pay | Admitting: Sports Medicine

## 2016-11-28 ENCOUNTER — Other Ambulatory Visit: Payer: Self-pay | Admitting: Sports Medicine

## 2016-11-28 MED ORDER — DOXYCYCLINE HYCLATE 100 MG PO TABS: 100.0000 mg | ORAL_TABLET | Freq: Two times a day (BID) | ORAL | 0 refills | Status: DC

## 2016-11-28 NOTE — Telephone Encounter (Signed)
Patient's daughter, Margarita Ranalaine Bailey, called stating that her father's right leg has blisters and he is having chills with a fever.

## 2016-11-28 NOTE — Telephone Encounter (Signed)
Elnita MaxwellCheryl call daughter. I sent antibiotics and if its not better after starting the antibiotics then he should go to the ER -Dr. [name]

## 2016-11-28 NOTE — Progress Notes (Signed)
Sent Doxycycline to his pharmacy -Dr. SKathie Rhodes

## 2016-11-29 DIAGNOSIS — J9610 Chronic respiratory failure, unspecified whether with hypoxia or hypercapnia: Secondary | ICD-10-CM | POA: Diagnosis not present

## 2016-11-30 ENCOUNTER — Telehealth: Payer: Self-pay | Admitting: *Deleted

## 2016-11-30 ENCOUNTER — Telehealth: Payer: Self-pay | Admitting: Cardiovascular Disease

## 2016-11-30 ENCOUNTER — Encounter: Payer: Self-pay | Admitting: Sports Medicine

## 2016-11-30 ENCOUNTER — Ambulatory Visit (INDEPENDENT_AMBULATORY_CARE_PROVIDER_SITE_OTHER): Payer: Medicare Other | Admitting: Sports Medicine

## 2016-11-30 DIAGNOSIS — R269 Unspecified abnormalities of gait and mobility: Secondary | ICD-10-CM | POA: Diagnosis not present

## 2016-11-30 DIAGNOSIS — S80821A Blister (nonthermal), right lower leg, initial encounter: Secondary | ICD-10-CM | POA: Diagnosis not present

## 2016-11-30 DIAGNOSIS — L97521 Non-pressure chronic ulcer of other part of left foot limited to breakdown of skin: Secondary | ICD-10-CM

## 2016-11-30 DIAGNOSIS — I739 Peripheral vascular disease, unspecified: Secondary | ICD-10-CM

## 2016-11-30 DIAGNOSIS — Q667 Congenital pes cavus, unspecified foot: Secondary | ICD-10-CM

## 2016-11-30 DIAGNOSIS — L03115 Cellulitis of right lower limb: Secondary | ICD-10-CM

## 2016-11-30 DIAGNOSIS — E1140 Type 2 diabetes mellitus with diabetic neuropathy, unspecified: Secondary | ICD-10-CM | POA: Diagnosis not present

## 2016-11-30 DIAGNOSIS — R609 Edema, unspecified: Secondary | ICD-10-CM

## 2016-11-30 DIAGNOSIS — G600 Hereditary motor and sensory neuropathy: Secondary | ICD-10-CM

## 2016-11-30 MED ORDER — MUPIROCIN 2 % EX OINT: TOPICAL_OINTMENT | CUTANEOUS | 0 refills | Status: DC

## 2016-11-30 NOTE — Telephone Encounter (Addendum)
-----  Message from Landis Martins, [place] sent at 11/30/2016 10:36 AM EST ----- Regarding: [facility] wound care  Please eval and treat Wound care Right leg apply bactroban cream and 4x4, abd, kerlix and ace to knee Left foot sub met 1 apply betadine, offloading pad, kerlix, ace to knee. Faxed copy of Dr. Leeanne Rio 11/30/2016 10:36am orders.

## 2016-11-30 NOTE — Telephone Encounter (Signed)
New message     Call from daughter Consuella Loselaine reporting swelling. Blisters forming. Patient currently taking antibiotics from [facility].  Started swelling on Sunday.  Please call  [phone]  Pt c/o swelling: STAT is pt has developed SOB within 24 hours  1) How much weight have you gained and in what time span? N/A  2) If swelling, where is the swelling located? Both legs  3) Are you currently taking a fluid pill?  NO  4) Are you currently SOB? YES  5) Do you have a log of your daily weights (if so, list)? NO  6) Have you gained 3 pounds in a day or 5 pounds in a week? N/A  7) Have you traveled recently? NO

## 2016-11-30 NOTE — Telephone Encounter (Signed)
Left message to call back  

## 2016-11-30 NOTE — Progress Notes (Signed)
Patient ID: [name], male   DOB: 1931-07-08, 83 y.o.   MRN: [id number]  Subjective: [name] is a 83 y.o. male patient seen in office for follow up evaluation of left foot, sub-met 1 ulcer and NEW RIGHT LEG CELLUITIS with blisters that started 1-2 days ago but swelling has been present since after his Left shoulder was replaced on 11/16; Patient reports that he is doing ok. Has shortness of breath. Denies chest pain, nausea, vomiting, fever, chills, night sweats. Patient has no other pedal complaints at this time.  Patient is assisted by his daughter this visit.   Patient Active Problem List   Diagnosis Date Noted  . S/P shoulder replacement, left 11/16/2016  . Preoperative clearance 10/15/2016  . Hyperlipidemia 10/15/2016  . Diabetes ([place]) 03/10/2015  . Perennial and seasonal allergic rhinitis 12/20/2014  . Hematuria 12/20/2014  . Loss of weight 05/06/2014  . Other pancytopenia ([place]) 05/05/2014  . CAP (community acquired pneumonia) 05/05/2014  . Hypocalcemia 05/05/2014  . Skin lesion 04/30/2014  . 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 Abdominal pain, right upper quadrant 05/04/2013  . Cold intolerance 05/04/2013  . Low back pain 04/03/2012  . Encounter for long-term (current) use of other medications 12/11/2011  . Routine general medical examination at a health care facility 12/17/2010  . Screening for prostate cancer 12/12/2010  . Osteoarthrosis, unspecified whether generalized or localized, unspecified site 10/24/2010  . SKIN RASH 06/29/2009  . Iron deficiency anemia 03/10/2009  . BACK PAIN, LUMBAR 03/10/2009  . [place] DISEASE 05/03/2008  . DYSPNEA 05/03/2008  . DEPRESSIVE DISORDER NOT ELSEWHERE CLASSIFIED 05/22/2007  . CHEST PAIN 05/22/2007  . ANEMIA, PERNICIOUS 12/02/2006  . Essential hypertension 07/26/2006  . Coronary atherosclerosis 07/26/2006  . GERD 07/26/2006   Current Outpatient Medications on File Prior to Visit  Medication  Sig Dispense Refill  . acetaminophen (TYLENOL) 500 MG tablet Take 1,000-1,500 mg 2 (two) times daily as needed by mouth for moderate pain.    . Ascorbic Acid (VITAMIN C) 500 MG tablet Take 500 mg by mouth 2 (two) times daily.     Marland Kitchen aspirin (GOODSENSE ASPIRIN) 325 MG tablet Take 325 mg daily by mouth.     . Blood Glucose Calibration (ADVOCATE REDI-CODE+ CONTROL) LOW SOLN   3  . Blood Glucose Monitoring Suppl (ADVOCATE REDI-CODE+) DEVI   1  . cholecalciferol (VITAMIN D) 1000 units tablet Take 1,000 Units at bedtime by mouth.    Marland Kitchen CLEVER CHEK AUTO-CODE VOICE test strip     . Cyanocobalamin (B-12 PO) Take 1 tablet daily by mouth.    . Cyanocobalamin (VITAMIN B-12 IJ) GET A B12 INJECTION ONCE EVERY MONTH     . doxycycline (VIBRA-TABS) 100 MG tablet Take 1 tablet (100 mg total) by mouth 2 (two) times daily. 20 tablet 0  . ferrous sulfate 325 (65 FE) MG tablet Take 325 mg 2 (two) times daily by mouth.    . gabapentin (NEURONTIN) 300 MG capsule Take 2 capsules (600 mg total) by mouth at bedtime. 180 capsule 3  . Inositol Niacinate (NIACIN FLUSH FREE) 500 MG CAPS Take 500 mg at bedtime by mouth.     Elmore Guise Devices (SIMPLE DIAGNOSTICS LANCING DEV) MISC   1  . metFORMIN (GLUCOPHAGE-XR) 500 MG 24 hr tablet Take 2 tablets (1,000 mg total) by mouth daily. (Patient taking differently: Take 500 mg 2 (two) times daily by mouth. ) 180 tablet 3  . metFORMIN (GLUCOPHAGE-XR) 500 MG 24 hr tablet TAKE 2 TABLETS  BY MOUTH  DAILY 180 tablet 3  . montelukast (SINGULAIR) 10 MG tablet TAKE 1 TABLET BY MOUTH AT  BEDTIME 90 tablet 3  . Multiple Vitamins-Minerals (MULTIVITAMIN WITH MINERALS) tablet Take 1 tablet by mouth daily.      Marland Kitchen oxyCODONE-acetaminophen (ROXICET) 5-325 MG tablet Take 1 tablet every 4 (four) hours as needed by mouth for severe pain. 30 tablet 0  . sertraline (ZOLOFT) 50 MG tablet TAKE 1 TABLET BY MOUTH  DAILY 90 tablet 3  . simvastatin (ZOCOR) 80 MG tablet TAKE 1 TABLET BY MOUTH AT  BEDTIME 90 tablet 3   . traMADol (ULTRAM) 50 MG tablet Take 1 tablet (50 mg total) by mouth every 12 (twelve) hours as needed. (Patient taking differently: Take 50 mg every 12 (twelve) hours as needed by mouth for moderate pain. ) 180 tablet 1  . Zinc 50 MG CAPS Take 50 mg at bedtime by mouth.      No current facility-administered medications on file prior to visit.    No Known Allergies  Recent Results (from the past 2160 hour(s))  POCT HgB A1C     Status: None   Collection Time: 09/05/16 10:41 AM  Result Value Ref Range   Hemoglobin A1C 6.2   Glucose, capillary     Status: Abnormal   Collection Time: 11/14/16  2:09 PM  Result Value Ref Range   Glucose-Capillary 112 (H) 65 - 99 mg/dL  CBC     Status: Abnormal   Collection Time: 11/14/16  2:58 PM  Result Value Ref Range   WBC 4.4 4.0 - 10.5 K/uL   RBC 3.97 (L) 4.22 - 5.81 MIL/uL   Hemoglobin 11.7 (L) 13.0 - 17.0 g/dL   HCT 36.4 (L) 39.0 - 52.0 %   MCV 91.7 78.0 - 100.0 fL   MCH 29.5 26.0 - 34.0 pg   MCHC 32.1 30.0 - 36.0 g/dL   RDW 13.9 11.5 - 15.5 %   Platelets 173 150 - 400 K/uL  Basic metabolic panel     Status: Abnormal   Collection Time: 11/14/16  2:58 PM  Result Value Ref Range   Sodium 135 135 - 145 mmol/L   Potassium 4.7 3.5 - 5.1 mmol/L   Chloride 102 101 - 111 mmol/L   CO2 26 22 - 32 mmol/L   Glucose, Bld 103 (H) 65 - 99 mg/dL   BUN 21 (H) 6 - 20 mg/dL   Creatinine, Ser 0.72 0.61 - 1.24 mg/dL   Calcium 9.2 8.9 - 10.3 mg/dL   GFR calc non Af Amer >60 >60 mL/min   GFR calc Af Amer >60 >60 mL/min    Comment: (NOTE) The eGFR has been calculated using the CKD EPI equation. This calculation has not been validated in all clinical situations. eGFR's persistently <60 mL/min signify possible Chronic Kidney Disease.    Anion gap 7 5 - 15  Surgical pcr screen     Status: None   Collection Time: 11/14/16  2:58 PM  Result Value Ref Range   MRSA, PCR NEGATIVE NEGATIVE   Staphylococcus aureus NEGATIVE NEGATIVE    Comment: (NOTE) The Xpert  SA Assay (FDA approved for NASAL specimens in patients 19 years of age and older), is one component of a comprehensive surveillance program. It is not intended to diagnose infection nor to guide or monitor treatment.   Hemoglobin A1c     Status: Abnormal   Collection Time: 11/14/16  2:59 PM  Result Value Ref Range   Hgb A1c  MFr Bld 6.5 (H) 4.8 - 5.6 %    Comment: (NOTE) Pre diabetes:          5.7%-6.4% Diabetes:              >6.4% Glycemic control for   <7.0% adults with diabetes    Mean Plasma Glucose 139.85 mg/dL  Glucose, capillary     Status: None   Collection Time: 11/16/16  1:13 PM  Result Value Ref Range   Glucose-Capillary 94 65 - 99 mg/dL  Glucose, capillary     Status: None   Collection Time: 11/16/16  4:53 PM  Result Value Ref Range   Glucose-Capillary 98 65 - 99 mg/dL   Comment 1 Notify RN    Comment 2 Document in Chart   Glucose, capillary     Status: Abnormal   Collection Time: 11/16/16  8:59 PM  Result Value Ref Range   Glucose-Capillary 135 (H) 65 - 99 mg/dL   Comment 1 Notify RN    Comment 2 Document in Chart   Hemoglobin and hematocrit, blood     Status: Abnormal   Collection Time: 11/17/16  6:04 AM  Result Value Ref Range   Hemoglobin 10.9 (L) 13.0 - 17.0 g/dL   HCT 33.8 (L) 39.0 - 52.0 %  Glucose, capillary     Status: Abnormal   Collection Time: 11/17/16  7:58 AM  Result Value Ref Range   Glucose-Capillary 135 (H) 65 - 99 mg/dL    Objective: There were no vitals filed for this visit.  General: Patient is awake, alert, oriented x 3 and in no acute distress in electric wheelchair.  Dermatology: Skin is warm and dry bilateral with a callused over ulceration Left sub-met, that measures 1x 1.5  x0.3 cm dry heme with reactive keratosis, To the right medial and anterior shin there is clear fluid blisters multiple in nature <0.5cm with focal erythema and warmth with 1+ pitting edema bilateral, no odor, There is mild keratosis at left medial heel with no  signs of infection. Nails x 10 mycotic but short.    Vascular: Dorsalis Pedis pulse = 1/4 Bilateral,  Posterior Tibial pulse = 0/4 Bilateral,  Capillary Fill Time < 5 seconds  Neurologic: Protective sensation absent bilateral using the 5.07/10g Semmes Weinstein Monofilament.  Musculosketal: No pain with palpation to ulcerated areas. Pes cavus foot type with prominent first metatarsal head bilateral, left greater than right. Hammertoes with Muscle weakness requiring bracing bilateral from Charcot-Marie-Tooth.  Assessment and Plan:  Problem List Items Addressed This Visit      Nervous and Auditory   CHARCOT-MARIE-TOOTH DISEASE    Other Visit Diagnoses    Blister of right lower extremity, initial encounter    -  Primary   Cellulitis of right leg       PVD (peripheral vascular disease) ([place])       Foot ulcer, left, limited to breakdown of skin ([place])       Callused over   Type 2 diabetes, controlled, with neuropathy (HCC)       Pes cavus, congenital       Abnormality of gait       Swelling         -Examined patient and discussed the progression of Recurrent wound and NEW CELLULITIS ON RIGHT LEG WITH CLEAR FLUID BLISTERS -Continue with PO antibiotic  -Cleansed ulcertations then applied offloading pad and betadine dressing to left foot and antibiotic cream to right leg all covered with dry dressing and ACE wraps  to knees; Patient's daughter and home nursing via well care to assist patient with dressing changes consisting of the same. Rx Bactroban to use to right leg.  -Patient is awaiting consideration for Artacent 60m disk. Since the wound is larger and recurrent and has been ongoing and have failed to completely heal in the setting of diabetes with neuropathy -Continue with custom shoes and braces and orthotics   -Continue with skin emollients to keratotic heel and dry skin areas -Continue with gabapentin for neuropathy -Patient to return to office for ulcer check in 2 weeks.  TLandis Martins DPM

## 2016-12-03 NOTE — Telephone Encounter (Signed)
I spoke with pt's daughter. She reports pt had left shoulder surgery 2 weeks ago. Left arm is swollen.  She has spoken with ortho and advised to keep arm elevated. Swelling goes away and then comes back. Pt also has swelling in lower extremities. Started about 8 days ago. Has blisters on legs and was told by foot doctor this is due to fluid.  Advised to follow up with cardiology.  Pt is chronically short of breath.  Daughter reports this has not worsened. OK at rest. Short of breath with exertion. Does not weigh daily.  No increased salt intake. Not on a diuretic.  I scheduled pt to see Jacolyn ReedyMichele Lenze, PA tomorrow at 11:30

## 2016-12-03 NOTE — Telephone Encounter (Signed)
Follow Up Call:  Returning a call

## 2016-12-04 ENCOUNTER — Encounter: Payer: Self-pay | Admitting: Physician Assistant

## 2016-12-04 ENCOUNTER — Ambulatory Visit: Payer: Medicare Other | Admitting: Physician Assistant

## 2016-12-04 VITALS — BP 120/50 | HR 66 | Ht 70.0 in | Wt 195.0 lb

## 2016-12-04 DIAGNOSIS — Z96612 Presence of left artificial shoulder joint: Secondary | ICD-10-CM | POA: Diagnosis not present

## 2016-12-04 DIAGNOSIS — I10 Essential (primary) hypertension: Secondary | ICD-10-CM | POA: Diagnosis not present

## 2016-12-04 DIAGNOSIS — I509 Heart failure, unspecified: Secondary | ICD-10-CM

## 2016-12-04 DIAGNOSIS — I5031 Acute diastolic (congestive) heart failure: Secondary | ICD-10-CM | POA: Diagnosis not present

## 2016-12-04 DIAGNOSIS — I25810 Atherosclerosis of coronary artery bypass graft(s) without angina pectoris: Secondary | ICD-10-CM

## 2016-12-04 DIAGNOSIS — G600 Hereditary motor and sensory neuropathy: Secondary | ICD-10-CM | POA: Diagnosis not present

## 2016-12-04 LAB — BASIC METABOLIC PANEL
BUN/Creatinine Ratio: 39 — ABNORMAL HIGH (ref 10–24)
BUN: 24 mg/dL (ref 8–27)
CALCIUM: 9 mg/dL (ref 8.6–10.2)
CHLORIDE: 105 mmol/L (ref 96–106)
CO2: 23 mmol/L (ref 20–29)
CREATININE: 0.61 mg/dL — AB (ref 0.76–1.27)
GFR calc Af Amer: 107 mL/min/{1.73_m2} (ref >59)
GFR calc non Af Amer: 92 mL/min/{1.73_m2} (ref >59)
GLUCOSE: 102 mg/dL — AB (ref 65–99)
Potassium: 4.9 mmol/L (ref 3.5–5.2)
Sodium: 141 mmol/L (ref 134–144)

## 2016-12-04 LAB — PRO B NATRIURETIC PEPTIDE: NT-PRO BNP: 1334 pg/mL — AB (ref 0–486)

## 2016-12-04 MED ORDER — FUROSEMIDE 40 MG PO TABS: 40.0000 mg | ORAL_TABLET | Freq: Every day | ORAL | 3 refills | Status: DC

## 2016-12-04 MED ORDER — POTASSIUM CHLORIDE CRYS ER 20 MEQ PO TBCR: 20.0000 meq | EXTENDED_RELEASE_TABLET | Freq: Every day | ORAL | 3 refills | Status: DC

## 2016-12-04 NOTE — Patient Instructions (Signed)
Medication Instructions:  Your physician has recommended you make the following change in your medication:   START: Lasix 40 mg once a day  START: Potassium 20 mEq once a day    Labwork: Today for basic metabolic panel, and Pro BNP  Testing/Procedures: Your physician has requested that you have an echocardiogram in 1-2 weeks on the same day as your follow appointment with Jacolyn ReedyMichele Lenze, PA. Echocardiography is a painless test that uses sound waves to create images of your heart. It provides your doctor with information about the size and shape of your heart and how well your heart's chambers and valves are working. This procedure takes approximately one hour. There are no restrictions for this procedure.   Follow-Up: Your physician recommends that you schedule a follow-up appointment in: 1-2 weeks with Jacolyn ReedyMichele Lenze, PA   Any Other Special Instructions Will Be Listed Below (If Applicable).     If you need a refill on your cardiac medications before your next appointment, please call your pharmacy.

## 2016-12-04 NOTE — Progress Notes (Signed)
Cardiology Office Note    Date:  12/04/2016   ID:  [name], DOB 02/19/1931, MRN [id number]  PCP:  Romero Belling, MD  Cardiologist: Dr. Clifton James  No chief complaint on file.   History of Present Illness:  [name] is a 83 y.o. male with history of CAD status post CABG 5 in 1995 with 3 left cath 2006. No ischemia on stress Myoview 04/2010. Stress test ordered in 2015 but patient was unable to lie flat which is a chronic problem for him. History of SVT in 2014 but also bradycardia not on rate controlling meds. Will need to see EPS if symptoms from SVT.Marland Kitchen Echo in 2015 normal LV size and function mild AI and TR, mild pulmonary hypertension. Has chronic shortness of breath secondary to Charcot-Marie-Tooth syndrome.   Last saw Dr. Clifton James 12/07/15 and had stable angina. No beta blocker b/c of bradycardia.   I saw the patient for preoperative clearance for reverse total shoulder surgery 10/15/16. Because of his Charcot-Marie-Tooth syndrome we cannot assess his functional status.  He gets short of breath with very little activity and cannot lie flat for nuclear stress test.  He was not having angina.  Dr. Clifton James cleared him for surgery.  Patient called in yesterday complaining of worsening edema.  He is brought in today accompanied by his daughter.  His weight is 195 pounds but he is not wearing his braces which weighed 5 pounds each.  His surgery went well.  Unfortunately he has gained approximately 10 pounds of fluid since his surgery.  He has lower extremity edema as well as left arm edema.  He is chronically short of breath and this has not changed.  Past Medical History:  Diagnosis Date  . ANEMIA, IRON DEFICIENCY   . Arthritis   . BACK PAIN, LUMBAR   . Charcot-Marie-Tooth disease    assoiciated muscular dystrophy   . COPD (chronic obstructive pulmonary disease) (HCC)   . Coronary artery disease   . DIABETES MELLITUS, TYPE II   . GERD (gastroesophageal reflux disease)   .  H/O: asbestos exposure   . Hypertension   . Leukopenia   . Myocardial infarction (HCC)   . Pernicious anemia   . Pneumonia   . Sleep apnea   . Smoker    quit 1968--25 ppy    Past Surgical History:  Procedure Laterality Date  . CATARACT EXTRACTION W/ INTRAOCULAR LENS  IMPLANT, BILATERAL    . CORONARY ARTERY BYPASS GRAFT  1995  . CYST EXCISION     on right shoulder  . REVERSE SHOULDER ARTHROPLASTY Left 11/16/2016   Procedure: LEFT REVERSE SHOULDER ARTHROPLASTY;  Surgeon: Beverely Low, MD;  Location[facility];  Service: Orthopedics;  Laterality: Left;    Current Medications: Current Meds  Medication Sig  . acetaminophen (TYLENOL) 500 MG tablet Take 1,000-1,500 mg 2 (two) times daily as needed by mouth for moderate pain.  . Ascorbic Acid (VITAMIN C) 500 MG tablet Take 500 mg by mouth 2 (two) times daily.   Marland Kitchen aspirin (GOODSENSE ASPIRIN) 325 MG tablet Take 325 mg daily by mouth.   . Blood Glucose Calibration (ADVOCATE REDI-CODE+ CONTROL) LOW SOLN   . Blood Glucose Monitoring Suppl (ADVOCATE REDI-CODE+) DEVI   . cholecalciferol (VITAMIN D) 1000 units tablet Take 1,000 Units at bedtime by mouth.  Marland Kitchen CLEVER CHEK AUTO-CODE VOICE test strip 1 each by Other route as directed.   . Cyanocobalamin (B-12 PO) Take 1 tablet daily by mouth.  . Cyanocobalamin (VITAMIN  B-12 IJ) GET A B12 INJECTION ONCE EVERY MONTH   . doxycycline (VIBRA-TABS) 100 MG tablet Take 1 tablet (100 mg total) by mouth 2 (two) times daily.  . ferrous sulfate 325 (65 FE) MG tablet Take 325 mg 2 (two) times daily by mouth.  . gabapentin (NEURONTIN) 300 MG capsule Take 2 capsules (600 mg total) by mouth at bedtime.  . Inositol Niacinate (NIACIN FLUSH FREE) 500 MG CAPS Take 500 mg at bedtime by mouth.   Demetra Shiner Devices (SIMPLE DIAGNOSTICS LANCING DEV) MISC   . metFORMIN (GLUCOPHAGE-XR) 500 MG 24 hr tablet Take 500 mg by mouth 2 (two) times daily.  . montelukast (SINGULAIR) 10 MG tablet TAKE 1 TABLET BY MOUTH AT  BEDTIME  .  Multiple Vitamins-Minerals (MULTIVITAMIN WITH MINERALS) tablet Take 1 tablet by mouth daily.    . mupirocin ointment (BACTROBAN) 2 % To Right leg blisters  . oxyCODONE-acetaminophen (ROXICET) 5-325 MG tablet Take 1 tablet every 4 (four) hours as needed by mouth for severe pain.  Marland Kitchen sertraline (ZOLOFT) 50 MG tablet TAKE 1 TABLET BY MOUTH  DAILY  . simvastatin (ZOCOR) 80 MG tablet TAKE 1 TABLET BY MOUTH AT  BEDTIME  . traMADol (ULTRAM) 50 MG tablet Take 50 mg by mouth 2 (two) times daily.  . Zinc 50 MG CAPS Take 50 mg at bedtime by mouth.      Allergies:   Patient has no known allergies.   Social History   Socioeconomic History  . Marital status: Widowed    Spouse name: None  . Number of children: None  . Years of education: None  . Highest education level: None  Social Needs  . Financial resource strain: None  . Food insecurity - worry: None  . Food insecurity - inability: None  . Transportation needs - medical: None  . Transportation needs - non-medical: None  Occupational History  . Occupation: Retired    Associate Professor: RETIRED    Comment: Games developer  Tobacco Use  . Smoking status: Former Smoker    Packs/day: 1.00    Years: 20.00    Pack years: 20.00    Types: Cigarettes    Last attempt to quit: 01/01/1966    Years since quitting: 50.9  . Smokeless tobacco: Never Used  Substance and Sexual Activity  . Alcohol use: No    Alcohol/week: 0.0 oz  . Drug use: No  . Sexual activity: None  Other Topics Concern  . None  Social History Narrative   Was in service during [place] War.  Lives alone in a one story home.  On disability.          Family History:  The patient's family history includes COPD in his daughter; Charcot-Marie-Tooth disease in his brother, brother, brother, other, sister, sister, and son.   ROS:   Please see the history of present illness.    Review of Systems  Constitution: Positive for weakness, malaise/fatigue and weight gain.  Cardiovascular: Positive  for dyspnea on exertion and leg swelling.  Respiratory: Positive for shortness of breath and sleep disturbances due to breathing.   Musculoskeletal: Positive for joint pain and joint swelling.  Gastrointestinal: Positive for diarrhea.  Neurological: Positive for loss of balance.   All other systems reviewed and are negative.   PHYSICAL EXAM:   VS:  BP (!) 120/50   Pulse 66   Ht 5\' 10"  (1.778 m)   Wt 195 lb (88.5 kg)   SpO2 98%   BMI 27.98 kg/m   Physical  Exam  GEN: Well nourished, well developed, in no acute distress  Neck: no JVD, carotid bruits, or masses Cardiac:RRR; no murmurs, rubs, or gallops  Respiratory: Decreased breath sounds but clear to auscultation bilaterally, normal work of breathing GI: soft, nontender, nondistended, + BS Ext: 3-4+ lower extremity edema 1-2+ left upper extremity edema Neuro:  Alert and Oriented x 3 Psych: euthymic mood, full affect  Wt Readings from Last 3 Encounters:  12/04/16 195 lb (88.5 kg)  11/16/16 182 lb (82.6 kg)  11/14/16 182 lb (82.6 kg)      Studies/Labs Reviewed:   EKG:  EKG is not ordered today.   Recent Labs: 03/05/2016: ALT 17; TSH 1.61 11/14/2016: BUN 21; Creatinine, Ser 0.72; Platelets 173; Potassium 4.7; Sodium 135 11/17/2016: Hemoglobin 10.9   Lipid Panel    Component Value Date/Time   CHOL 147 03/05/2016 1042   TRIG 93.0 03/05/2016 1042   HDL 56.00 03/05/2016 1042   CHOLHDL 3 03/05/2016 1042   VLDL 18.6 03/05/2016 1042   LDLCALC 72 03/05/2016 1042    Additional studies/ records that were reviewed today include:   Echo 01/12/13: Left ventricle: The cavity size was normal. There was mild focal basal hypertrophy of the septum. Systolic function was normal. The estimated ejection fraction was in the range of 55% to 60%. Wall motion was normal; there were no regional wall motion abnormalities. - Aortic valve: Mild regurgitation. - Left atrium: The atrium was mildly dilated. - Right ventricle: The cavity size  was mildly dilated. Wall thickness was normal. - Tricuspid valve: Mild-moderate regurgitation. - Pulmonary arteries: PA peak pressure: 37mm Hg (S). Impressions:  - The right ventricular systolic pressure was increased consistent with mild pulmonary hypertension     Cardiac catheterization 2006  LEFT VENTRICULOGRAM:  The left ventriculogram in the RAO projection shows  good wall motion with no areas of hypokinesis. The estimated ejection  fraction was 60%.    CONCLUSION:  1.  Coronary artery status post coronary bypass coronary artery bypass graft      surgery 1995.  2.  Severe native vessel disease with a total occlusion of the left anterior      descending artery, circumflex artery, and right coronary arteries.  3.  Patent vein graft to the distal right coronary artery, patent sequential      vein grafts to the marginal and posterolateral branches of the      circumflex artery, and patent sequential LIMA graft to the diagonal      branch LAD and LAD.  4.  Good LV function.    RECOMMENDATIONS:  All grafts are patent and there is no source of ischemia.  In view of these findings, I think, the patient's recent symptoms are not  cardiac in etiology. Dr. Sung AmabileSimonds is in the process of making a decision of  whether the patient will require a tracheostomy for his respiratory  problems.      ASSESSMENT:    1. Congestive heart failure, unspecified HF chronicity, unspecified heart failure type (HCC)   2. Acute diastolic CHF (congestive heart failure) (HCC)   3. Atherosclerosis of coronary artery bypass graft of native heart without angina pectoris   4. Essential hypertension   5. CHARCOT-MARIE-TOOTH DISEASE      PLAN:  In order of problems listed above:  CHF presumed diastolic with previous normal LV function.  Also had pulmonary hypertension on echo in 2015.  May have gotten extra fluids in the OR during recent shoulder surgery.  Will give Lasix  40 mg daily and potassium 20 mEq  daily.  Will repeat 2D echo to reassess LV function and diastolic function.  2 g sodium diet reiterated.  Follow-up with myself on same day as echo in the next 1-2 weeks. Check BMET and BNP as well.  CAD status post CABG x5 in 1995 last cath in 2006 grafts were patent and good LV function.  Stress test in 2012 was low risk.  No angina.  Essential hypertension controlled  Charcot Hilda LiasMarie tooth disease with chronic dyspnea    Medication Adjustments/Labs and Tests Ordered: Current medicines are reviewed at length with the patient today.  Concerns regarding medicines are outlined above.  Medication changes, Labs and Tests ordered today are listed in the Patient Instructions below. There are no Patient Instructions on file for this visit.   Elson ClanSigned, Sakari Alkhatib, PA-C  12/04/2016 11:50 AM    [facility] [address], [place], [place]  [address] Phone: [phone]; Fax: [phone]

## 2016-12-12 ENCOUNTER — Encounter: Payer: Self-pay | Admitting: Physician Assistant

## 2016-12-14 ENCOUNTER — Ambulatory Visit: Payer: Medicare Other | Admitting: Sports Medicine

## 2016-12-17 ENCOUNTER — Ambulatory Visit: Payer: Medicare Other | Admitting: Physician Assistant

## 2016-12-17 ENCOUNTER — Other Ambulatory Visit: Payer: Self-pay

## 2016-12-17 ENCOUNTER — Ambulatory Visit (HOSPITAL_COMMUNITY): Payer: Medicare Other | Attending: Cardiovascular Disease

## 2016-12-17 ENCOUNTER — Encounter (INDEPENDENT_AMBULATORY_CARE_PROVIDER_SITE_OTHER): Payer: Self-pay

## 2016-12-17 ENCOUNTER — Encounter: Payer: Self-pay | Admitting: Physician Assistant

## 2016-12-17 VITALS — BP 120/52 | HR 60 | Ht 70.0 in | Wt 183.0 lb

## 2016-12-17 DIAGNOSIS — I10 Essential (primary) hypertension: Secondary | ICD-10-CM

## 2016-12-17 DIAGNOSIS — I071 Rheumatic tricuspid insufficiency: Secondary | ICD-10-CM | POA: Diagnosis not present

## 2016-12-17 DIAGNOSIS — I509 Heart failure, unspecified: Secondary | ICD-10-CM | POA: Diagnosis not present

## 2016-12-17 DIAGNOSIS — G600 Hereditary motor and sensory neuropathy: Secondary | ICD-10-CM

## 2016-12-17 DIAGNOSIS — I25810 Atherosclerosis of coronary artery bypass graft(s) without angina pectoris: Secondary | ICD-10-CM

## 2016-12-17 DIAGNOSIS — I5032 Chronic diastolic (congestive) heart failure: Secondary | ICD-10-CM | POA: Diagnosis not present

## 2016-12-17 LAB — BASIC METABOLIC PANEL
BUN / CREAT RATIO: 28 — AB (ref 10–24)
BUN: 22 mg/dL (ref 8–27)
CO2: 25 mmol/L (ref 20–29)
CREATININE: 0.8 mg/dL (ref 0.76–1.27)
Calcium: 9.3 mg/dL (ref 8.6–10.2)
Chloride: 102 mmol/L (ref 96–106)
GFR, EST AFRICAN AMERICAN: 95 mL/min/{1.73_m2} (ref >59)
GFR, EST NON AFRICAN AMERICAN: 83 mL/min/{1.73_m2} (ref >59)
Glucose: 109 mg/dL — ABNORMAL HIGH (ref 65–99)
Potassium: 4.8 mmol/L (ref 3.5–5.2)
SODIUM: 140 mmol/L (ref 134–144)

## 2016-12-17 LAB — PRO B NATRIURETIC PEPTIDE: NT-Pro BNP: 888 pg/mL — ABNORMAL HIGH (ref 0–486)

## 2016-12-17 NOTE — Patient Instructions (Addendum)
Medication Instructions:  1. STOP LASIX  2. STOP POTASSIUM   Labwork: TODAY PRO BNP, BMET  Testing/Procedures: NONE ORDERED TODAY  Follow-Up: 01/10/17 AT 4:20 WITH DR. Clifton JamesMCALHANY   Any Other Special Instructions Will Be Listed Below (If Applicable).     If you need a refill on your cardiac medications before your next appointment, please call your pharmacy.

## 2016-12-17 NOTE — Progress Notes (Signed)
Cardiology Office Note    Date:  12/17/2016   ID:  [name], DOB 1931/03/21, MRN [id number]  PCP:  Romero Belling, MD  Cardiologist: Dr Clifton James  Chief Complaint  Patient presents with  . Follow-up    History of Present Illness:  [name] is a 83 y.o. male with history of CAD status post CABG x5 in 1995 with follow-up cath in 2006 all grafts were patent and he had normal LV.  No ischemia on stress test 04/2010.  History of SVT in 2014 but also bradycardic so not on rate lowering medications.  Will need to see EPS if symptoms from SVT.  Echo in 2015 normal LV size and function mild AI and TR, mild pulmonary hypertension.  Patient has chronic shortness of breath secondary to Charcot Marie tooth syndrome.  I saw the patient for preoperative clearance for reverse total shoulder surgery 10/15/16. Because of his Charcot-Marie-Tooth syndrome we cannot assess his functional status.  He gets short of breath with very little activity and cannot lie flat for nuclear stress test.  He was not having angina.  Dr. Clifton James cleared him for surgery.  I saw the patient 12/04/16 when he had gained approximately 10 pounds of fluid since his surgery.  He had significant lower extremity edema as well as left arm edema.  His chronic shortness of breath was unchanged.  BNP was elevated at 1334 creatinine was stable at 0.61.  2D echo was done today and preliminary shows normal LV systolic function.  I felt he may have gotten extra fluid in the OR during his recent shoulder surgery.  I gave him Lasix 40 mg daily and potassium 20 daily.   Patient comes in today accompanied by his daughter.  He lost all the fluid in his arms and only has mild left foot edema when he does not keep it elevated.  He has chronic dyspnea on exertion that is unchanged.  He is dizzy.   Past Medical History:  Diagnosis Date  . ANEMIA, IRON DEFICIENCY   . Arthritis   . BACK PAIN, LUMBAR   . Charcot-Marie-Tooth disease    assoiciated muscular dystrophy   . COPD (chronic obstructive pulmonary disease) (HCC)   . Coronary artery disease   . DIABETES MELLITUS, TYPE II   . GERD (gastroesophageal reflux disease)   . H/O: asbestos exposure   . Hypertension   . Leukopenia   . Myocardial infarction (HCC)   . Pernicious anemia   . Pneumonia   . Sleep apnea   . Smoker    quit 1968--25 ppy    Past Surgical History:  Procedure Laterality Date  . CATARACT EXTRACTION W/ INTRAOCULAR LENS  IMPLANT, BILATERAL    . CORONARY ARTERY BYPASS GRAFT  1995  . CYST EXCISION     on right shoulder  . REVERSE SHOULDER ARTHROPLASTY Left 11/16/2016   Procedure: LEFT REVERSE SHOULDER ARTHROPLASTY;  Surgeon: Beverely Low, MD;  Location: [facility] OR;  Service: Orthopedics;  Laterality: Left;    Current Medications: Current Meds  Medication Sig  . acetaminophen (TYLENOL) 500 MG tablet Take 1,000-1,500 mg 2 (two) times daily as needed by mouth for moderate pain.  . Ascorbic Acid (VITAMIN C) 500 MG tablet Take 500 mg by mouth 2 (two) times daily.   Marland Kitchen aspirin (GOODSENSE ASPIRIN) 325 MG tablet Take 325 mg daily by mouth.   . Blood Glucose Calibration (ADVOCATE REDI-CODE+ CONTROL) LOW SOLN   . Blood Glucose Monitoring Suppl (ADVOCATE REDI-CODE+) DEVI   .  cholecalciferol (VITAMIN D) 1000 units tablet Take 1,000 Units at bedtime by mouth.  Marland Kitchen. CLEVER CHEK AUTO-CODE VOICE test strip 1 each by Other route as directed.   . Cyanocobalamin (B-12 PO) Take 1 tablet daily by mouth.  . Cyanocobalamin (VITAMIN B-12 IJ) GET A B12 INJECTION ONCE EVERY MONTH   . doxycycline (VIBRA-TABS) 100 MG tablet Take 1 tablet (100 mg total) by mouth 2 (two) times daily.  . ferrous sulfate 325 (65 FE) MG tablet Take 325 mg 2 (two) times daily by mouth.  . gabapentin (NEURONTIN) 300 MG capsule Take 2 capsules (600 mg total) by mouth at bedtime.  . Inositol Niacinate (NIACIN FLUSH FREE) 500 MG CAPS Take 500 mg at bedtime by mouth.   Demetra Shiner. Lancet Devices (SIMPLE DIAGNOSTICS  LANCING DEV) MISC   . metFORMIN (GLUCOPHAGE-XR) 500 MG 24 hr tablet Take 500 mg by mouth 2 (two) times daily.  . montelukast (SINGULAIR) 10 MG tablet TAKE 1 TABLET BY MOUTH AT  BEDTIME  . Multiple Vitamins-Minerals (MULTIVITAMIN WITH MINERALS) tablet Take 1 tablet by mouth daily.    . mupirocin ointment (BACTROBAN) 2 % To Right leg blisters  . oxyCODONE-acetaminophen (ROXICET) 5-325 MG tablet Take 1 tablet every 4 (four) hours as needed by mouth for severe pain.  Marland Kitchen. sertraline (ZOLOFT) 50 MG tablet TAKE 1 TABLET BY MOUTH  DAILY  . simvastatin (ZOCOR) 80 MG tablet TAKE 1 TABLET BY MOUTH AT  BEDTIME  . traMADol (ULTRAM) 50 MG tablet Take 50 mg by mouth 2 (two) times daily.  . Zinc 50 MG CAPS Take 50 mg at bedtime by mouth.   . [DISCONTINUED] furosemide (LASIX) 40 MG tablet Take 1 tablet (40 mg total) by mouth daily.  . [DISCONTINUED] potassium chloride SA (K-DUR,KLOR-CON) 20 MEQ tablet Take 1 tablet (20 mEq total) by mouth daily.     Allergies:   Patient has no known allergies.   Social History   Socioeconomic History  . Marital status: Widowed    Spouse name: None  . Number of children: None  . Years of education: None  . Highest education level: None  Social Needs  . Financial resource strain: None  . Food insecurity - worry: None  . Food insecurity - inability: None  . Transportation needs - medical: None  . Transportation needs - non-medical: None  Occupational History  . Occupation: Retired    Associate Professormployer: RETIRED    Comment: Games developerdiesel mechanic  Tobacco Use  . Smoking status: Former Smoker    Packs/day: 1.00    Years: 20.00    Pack years: 20.00    Types: Cigarettes    Last attempt to quit: 01/01/1966    Years since quitting: 50.9  . Smokeless tobacco: Never Used  Substance and Sexual Activity  . Alcohol use: No    Alcohol/week: 0.0 oz  . Drug use: No  . Sexual activity: None  Other Topics Concern  . None  Social History Narrative   Was in service during [place].  Lives  alone in a one story home.  On disability.          Family History:  The patient's family history includes COPD in his daughter; Charcot-Marie-Tooth disease in his brother, brother, brother, other, sister, sister, and son.   ROS:   Please see the history of present illness.    Review of Systems  Constitution: Negative.  HENT: Negative.   Cardiovascular: Positive for leg swelling.  Respiratory: Negative.   Endocrine: Negative.   Hematologic/Lymphatic: Negative.  Musculoskeletal: Negative.   Gastrointestinal: Negative.   Genitourinary: Negative.   Neurological: Positive for dizziness.   All other systems reviewed and are negative.   PHYSICAL EXAM:   VS:  BP (!) 120/52   Pulse 60   Ht 5\' 10"  (1.778 m)   Wt 183 lb (83 kg)   SpO2 94%   BMI 26.26 kg/m   Physical Exam  GEN: Well nourished, well developed, in no acute distress  Neck: no JVD, carotid bruits, or masses Cardiac:RRR; 1/6 systolic murmur at the left sternal border Respiratory:  clear to auscultation bilaterally, normal work of breathing GI: soft, nontender, nondistended, + BS Ext: without cyanosis, clubbing, or edema, Good distal pulses bilaterally Neuro:  Alert and Oriented x 3 Psych: euthymic mood, full affect  Wt Readings from Last 3 Encounters:  12/17/16 183 lb (83 kg)  12/04/16 195 lb (88.5 kg)  11/16/16 182 lb (82.6 kg)      Studies/Labs Reviewed:   EKG:  EKG is not ordered today.   Recent Labs: 03/05/2016: ALT 17; TSH 1.61 11/14/2016: Platelets 173 11/17/2016: Hemoglobin 10.9 12/04/2016: BUN 24; Creatinine, Ser 0.61; NT-Pro BNP 1,334; Potassium 4.9; Sodium 141   Lipid Panel    Component Value Date/Time   CHOL 147 03/05/2016 1042   TRIG 93.0 03/05/2016 1042   HDL 56.00 03/05/2016 1042   CHOLHDL 3 03/05/2016 1042   VLDL 18.6 03/05/2016 1042   LDLCALC 72 03/05/2016 1042    Additional studies/ records that were reviewed today include:   Echo 01/12/13: Left ventricle: The cavity size was  normal. There was mild focal basal hypertrophy of the septum. Systolic function was normal. The estimated ejection fraction was in the range of 55% to 60%. Wall motion was normal; there were no regional wall motion abnormalities. - Aortic valve: Mild regurgitation. - Left atrium: The atrium was mildly dilated. - Right ventricle: The cavity size was mildly dilated. Wall thickness was normal. - Tricuspid valve: Mild-moderate regurgitation. - Pulmonary arteries: PA peak pressure: 37mm Hg (S). Impressions:  - The right ventricular systolic pressure was increased consistent with mild pulmonary hypertension     Cardiac catheterization 2006  LEFT VENTRICULOGRAM:  The left ventriculogram in the RAO projection shows  good wall motion with no areas of hypokinesis. The estimated ejection  fraction was 60%.    CONCLUSION:  1.  Coronary artery status post coronary bypass coronary artery bypass graft      surgery 1995.  2.  Severe native vessel disease with a total occlusion of the left anterior      descending artery, circumflex artery, and right coronary arteries.  3.  Patent vein graft to the distal right coronary artery, patent sequential      vein grafts to the marginal and posterolateral branches of the      circumflex artery, and patent sequential LIMA graft to the diagonal      branch LAD and LAD.  4.  Good LV function.    RECOMMENDATIONS:  All grafts are patent and there is no source of ischemia.  In view of these findings, I think, the patient's recent symptoms are not  cardiac in etiology. Dr. Sung Amabile is in the process of making a decision of  whether the patient will require a tracheostomy for his respiratory  problems.       ASSESSMENT:    1. Chronic diastolic CHF (congestive heart failure) (HCC)   2. Essential hypertension   3. Atherosclerosis of coronary artery bypass graft of native heart  without angina pectoris   4. CHARCOT-MARIE-TOOTH DISEASE      PLAN:  In  order of problems listed above:  Chronic diastolic CHF with elevated BNP and edema 2 weeks ago after shoulder surgery.  Patient is well compensated today and actually dizzy.  He has never needed diuretic in the past.  Will stop Lasix and potassium.  Weigh himself daily and can use Lasix and potassium as needed.  Will check renal function and BNP today.  Preliminary 2D echo done today showed normal LV function.  Await for final for diastolic function and pulmonary hypertension.  Essential hypertension blood pressure is stable but he is dizzy.  Did not do orthostatics because of Charcot Hilda LiasMarie tooth syndrome  CAD status post CABG x5 in 1995 cath in 2006 grafts were patent with good LV function, stress test 2012 low risk, no angina.  Charcot-Marie-Tooth disease with chronic dyspnea.    Medication Adjustments/Labs and Tests Ordered: Current medicines are reviewed at length with the patient today.  Concerns regarding medicines are outlined above.  Medication changes, Labs and Tests ordered today are listed in the Patient Instructions below. Patient Instructions  Medication Instructions:  1. STOP LASIX  2. STOP POTASSIUM   Labwork: TODAY PRO BNP, BMET  Testing/Procedures: NONE ORDERED TODAY  Follow-Up: 01/10/17 AT 4:20 WITH DR. Clifton JamesMCALHANY   Any Other Special Instructions Will Be Listed Below (If Applicable).     If you need a refill on your cardiac medications before your next appointment, please call your pharmacy.      Elson ClanSigned, Michele Lenze, PA-C  12/17/2016 11:55 AM    [facility] [address], [place], [place]  [address] Phone: [phone]; Fax: [phone]

## 2016-12-19 ENCOUNTER — Telehealth: Payer: Self-pay | Admitting: *Deleted

## 2016-12-19 ENCOUNTER — Ambulatory Visit (INDEPENDENT_AMBULATORY_CARE_PROVIDER_SITE_OTHER): Payer: Medicare Other | Admitting: Sports Medicine

## 2016-12-19 ENCOUNTER — Encounter: Payer: Self-pay | Admitting: Sports Medicine

## 2016-12-19 DIAGNOSIS — S80821D Blister (nonthermal), right lower leg, subsequent encounter: Secondary | ICD-10-CM

## 2016-12-19 DIAGNOSIS — L03115 Cellulitis of right lower limb: Secondary | ICD-10-CM

## 2016-12-19 DIAGNOSIS — L97521 Non-pressure chronic ulcer of other part of left foot limited to breakdown of skin: Secondary | ICD-10-CM | POA: Diagnosis not present

## 2016-12-19 DIAGNOSIS — G600 Hereditary motor and sensory neuropathy: Secondary | ICD-10-CM

## 2016-12-19 DIAGNOSIS — E1140 Type 2 diabetes mellitus with diabetic neuropathy, unspecified: Secondary | ICD-10-CM

## 2016-12-19 DIAGNOSIS — R269 Unspecified abnormalities of gait and mobility: Secondary | ICD-10-CM

## 2016-12-19 DIAGNOSIS — I739 Peripheral vascular disease, unspecified: Secondary | ICD-10-CM

## 2016-12-19 DIAGNOSIS — Q667 Congenital pes cavus, unspecified foot: Secondary | ICD-10-CM

## 2016-12-19 NOTE — Telephone Encounter (Signed)
[facility] did not have pt's initial referral information. Faxed required form, clinical, and demographics to [facility].

## 2016-12-19 NOTE — Telephone Encounter (Signed)
-----  Message from Landis Martins, [place] sent at 12/19/2016 10:47 AM EST ----- Regarding: Home health care I sent message when patient was last her on 11/30 for bilateral wound care. Patient to have antibiotic cream and profore dressing on right and betadine to sub met 1 preulcerative wound and profore dressing on left. Patient DOES NOT WANT [facility] HOME [facility] -Dr. Cannon Kettle

## 2016-12-19 NOTE — Progress Notes (Signed)
Patient ID: [name], male   DOB: January 10, 1931, 83 y.o.   MRN: [id number]  Subjective: [name] is a 83 y.o. male patient seen in office for follow up evaluation of left foot, sub-met 1 ulcer and right leg cellulitis with blisters. Patient reports that he is doing ok. Has shortness of breath secondary to diaphragm issues. Denies chest pain, nausea, vomiting, fever, chills, night sweats. Patient has no other pedal complaints at this time.  Patient is assisted by his daughter this visit who has been changing dressings and reports that they have not received any contact from any home nurse service.  Patient Active Problem List   Diagnosis Date Noted  . Acute diastolic CHF (congestive heart failure) ([place]) 12/04/2016  . S/P shoulder replacement, left 11/16/2016  . Preoperative clearance 10/15/2016  . Hyperlipidemia 10/15/2016  . Diabetes ([place]) 03/10/2015  . Perennial and seasonal allergic rhinitis 12/20/2014  . Hematuria 12/20/2014  . Loss of weight 05/06/2014  . Other pancytopenia ([place]) 05/05/2014  . CAP (community acquired pneumonia) 05/05/2014  . Hypocalcemia 05/05/2014  . Skin lesion 04/30/2014  . 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 Abdominal pain, right upper quadrant 05/04/2013  . Cold intolerance 05/04/2013  . Low back pain 04/03/2012  . Encounter for long-term (current) use of other medications 12/11/2011  . Routine general medical examination at a health care facility 12/17/2010  . Screening for prostate cancer 12/12/2010  . Osteoarthrosis, unspecified whether generalized or localized, unspecified site 10/24/2010  . SKIN RASH 06/29/2009  . Iron deficiency anemia 03/10/2009  . BACK PAIN, LUMBAR 03/10/2009  . [place] DISEASE 05/03/2008  . DYSPNEA 05/03/2008  . DEPRESSIVE DISORDER NOT ELSEWHERE CLASSIFIED 05/22/2007  . CHEST PAIN 05/22/2007  . ANEMIA, PERNICIOUS 12/02/2006  . Essential hypertension 07/26/2006  . Coronary atherosclerosis  07/26/2006  . GERD 07/26/2006   Current Outpatient Medications on File Prior to Visit  Medication Sig Dispense Refill  . acetaminophen (TYLENOL) 500 MG tablet Take 1,000-1,500 mg 2 (two) times daily as needed by mouth for moderate pain.    . Ascorbic Acid (VITAMIN C) 500 MG tablet Take 500 mg by mouth 2 (two) times daily.     Marland Kitchen aspirin (GOODSENSE ASPIRIN) 325 MG tablet Take 325 mg daily by mouth.     . Blood Glucose Calibration (ADVOCATE REDI-CODE+ CONTROL) LOW SOLN   3  . Blood Glucose Monitoring Suppl (ADVOCATE REDI-CODE+) DEVI   1  . cholecalciferol (VITAMIN D) 1000 units tablet Take 1,000 Units at bedtime by mouth.    Marland Kitchen CLEVER CHEK AUTO-CODE VOICE test strip 1 each by Other route as directed.     . Cyanocobalamin (B-12 PO) Take 1 tablet daily by mouth.    . Cyanocobalamin (VITAMIN B-12 IJ) GET A B12 INJECTION ONCE EVERY MONTH     . doxycycline (VIBRA-TABS) 100 MG tablet Take 1 tablet (100 mg total) by mouth 2 (two) times daily. 20 tablet 0  . ferrous sulfate 325 (65 FE) MG tablet Take 325 mg 2 (two) times daily by mouth.    . gabapentin (NEURONTIN) 300 MG capsule Take 2 capsules (600 mg total) by mouth at bedtime. 180 capsule 3  . Inositol Niacinate (NIACIN FLUSH FREE) 500 MG CAPS Take 500 mg at bedtime by mouth.     Elmore Guise Devices (SIMPLE DIAGNOSTICS LANCING DEV) MISC   1  . metFORMIN (GLUCOPHAGE-XR) 500 MG 24 hr tablet Take 500 mg by mouth 2 (two) times daily.    . montelukast (SINGULAIR) 10 MG tablet  TAKE 1 TABLET BY MOUTH AT  BEDTIME 90 tablet 3  . Multiple Vitamins-Minerals (MULTIVITAMIN WITH MINERALS) tablet Take 1 tablet by mouth daily.      . mupirocin ointment (BACTROBAN) 2 % To Right leg blisters 22 g 0  . oxyCODONE-acetaminophen (ROXICET) 5-325 MG tablet Take 1 tablet every 4 (four) hours as needed by mouth for severe pain. 30 tablet 0  . sertraline (ZOLOFT) 50 MG tablet TAKE 1 TABLET BY MOUTH  DAILY 90 tablet 3  . simvastatin (ZOCOR) 80 MG tablet TAKE 1 TABLET BY MOUTH AT   BEDTIME 90 tablet 3  . traMADol (ULTRAM) 50 MG tablet Take 50 mg by mouth 2 (two) times daily.    . Zinc 50 MG CAPS Take 50 mg at bedtime by mouth.      No current facility-administered medications on file prior to visit.    No Known Allergies  Recent Results (from the past 2160 hour(s))  Glucose, capillary     Status: Abnormal   Collection Time: 11/14/16  2:09 PM  Result Value Ref Range   Glucose-Capillary 112 (H) 65 - 99 mg/dL  CBC     Status: Abnormal   Collection Time: 11/14/16  2:58 PM  Result Value Ref Range   WBC 4.4 4.0 - 10.5 K/uL   RBC 3.97 (L) 4.22 - 5.81 MIL/uL   Hemoglobin 11.7 (L) 13.0 - 17.0 g/dL   HCT 36.4 (L) 39.0 - 52.0 %   MCV 91.7 78.0 - 100.0 fL   MCH 29.5 26.0 - 34.0 pg   MCHC 32.1 30.0 - 36.0 g/dL   RDW 13.9 11.5 - 15.5 %   Platelets 173 150 - 400 K/uL  Basic metabolic panel     Status: Abnormal   Collection Time: 11/14/16  2:58 PM  Result Value Ref Range   Sodium 135 135 - 145 mmol/L   Potassium 4.7 3.5 - 5.1 mmol/L   Chloride 102 101 - 111 mmol/L   CO2 26 22 - 32 mmol/L   Glucose, Bld 103 (H) 65 - 99 mg/dL   BUN 21 (H) 6 - 20 mg/dL   Creatinine, Ser 0.72 0.61 - 1.24 mg/dL   Calcium 9.2 8.9 - 10.3 mg/dL   GFR calc non Af Amer >60 >60 mL/min   GFR calc Af Amer >60 >60 mL/min    Comment: (NOTE) The eGFR has been calculated using the CKD EPI equation. This calculation has not been validated in all clinical situations. eGFR's persistently <60 mL/min signify possible Chronic Kidney Disease.    Anion gap 7 5 - 15  Surgical pcr screen     Status: None   Collection Time: 11/14/16  2:58 PM  Result Value Ref Range   MRSA, PCR NEGATIVE NEGATIVE   Staphylococcus aureus NEGATIVE NEGATIVE    Comment: (NOTE) The Xpert SA Assay (FDA approved for NASAL specimens in patients 73 years of age and older), is one component of a comprehensive surveillance program. It is not intended to diagnose infection nor to guide or monitor treatment.   Hemoglobin A1c      Status: Abnormal   Collection Time: 11/14/16  2:59 PM  Result Value Ref Range   Hgb A1c MFr Bld 6.5 (H) 4.8 - 5.6 %    Comment: (NOTE) Pre diabetes:          5.7%-6.4% Diabetes:              >6.4% Glycemic control for   <7.0% adults with diabetes  Mean Plasma Glucose 139.85 mg/dL  Glucose, capillary     Status: None   Collection Time: 11/16/16  1:13 PM  Result Value Ref Range   Glucose-Capillary 94 65 - 99 mg/dL  Glucose, capillary     Status: None   Collection Time: 11/16/16  4:53 PM  Result Value Ref Range   Glucose-Capillary 98 65 - 99 mg/dL   Comment 1 Notify RN    Comment 2 Document in Chart   Glucose, capillary     Status: Abnormal   Collection Time: 11/16/16  8:59 PM  Result Value Ref Range   Glucose-Capillary 135 (H) 65 - 99 mg/dL   Comment 1 Notify RN    Comment 2 Document in Chart   Hemoglobin and hematocrit, blood     Status: Abnormal   Collection Time: 11/17/16  6:04 AM  Result Value Ref Range   Hemoglobin 10.9 (L) 13.0 - 17.0 g/dL   HCT 33.8 (L) 39.0 - 52.0 %  Glucose, capillary     Status: Abnormal   Collection Time: 11/17/16  7:58 AM  Result Value Ref Range   Glucose-Capillary 135 (H) 65 - 99 mg/dL  Basic metabolic panel     Status: Abnormal   Collection Time: 12/04/16 12:20 PM  Result Value Ref Range   Glucose 102 (H) 65 - 99 mg/dL   BUN 24 8 - 27 mg/dL   Creatinine, Ser 0.61 (L) 0.76 - 1.27 mg/dL   GFR calc non Af Amer 92 >59 mL/min/1.73   GFR calc Af Amer 107 >59 mL/min/1.73   BUN/Creatinine Ratio 39 (H) 10 - 24   Sodium 141 134 - 144 mmol/L   Potassium 4.9 3.5 - 5.2 mmol/L   Chloride 105 96 - 106 mmol/L   CO2 23 20 - 29 mmol/L   Calcium 9.0 8.6 - 10.2 mg/dL  Pro b natriuretic peptide     Status: Abnormal   Collection Time: 12/04/16 12:20 PM  Result Value Ref Range   NT-Pro BNP 1,334 (H) 0 - 486 pg/mL    Comment: The following cut-points have been suggested for the use of proBNP for the diagnostic evaluation of heart failure (HF) in patients  with acute dyspnea: Modality                     Age           Optimal Cut                            (years)            Point ------------------------------------------------------ Diagnosis (rule in HF)        <50            450 pg/mL                           50 - 75            900 pg/mL                               >75           1800 pg/mL Exclusion (rule out HF)  Age independent     300 pg/mL   Basic Metabolic Panel (BMET)     Status: Abnormal   Collection Time: 12/17/16 11:56 AM  Result Value Ref Range  Glucose 109 (H) 65 - 99 mg/dL   BUN 22 8 - 27 mg/dL   Creatinine, Ser 0.80 0.76 - 1.27 mg/dL   GFR calc non Af Amer 83 >59 mL/min/1.73   GFR calc Af Amer 95 >59 mL/min/1.73   BUN/Creatinine Ratio 28 (H) 10 - 24   Sodium 140 134 - 144 mmol/L   Potassium 4.8 3.5 - 5.2 mmol/L   Chloride 102 96 - 106 mmol/L   CO2 25 20 - 29 mmol/L   Calcium 9.3 8.6 - 10.2 mg/dL  Pro b natriuretic peptide (BNP)     Status: Abnormal   Collection Time: 12/17/16 11:56 AM  Result Value Ref Range   NT-Pro BNP 888 (H) 0 - 486 pg/mL    Comment: The following cut-points have been suggested for the use of proBNP for the diagnostic evaluation of heart failure (HF) in patients with acute dyspnea: Modality                     Age           Optimal Cut                            (years)            Point ------------------------------------------------------ Diagnosis (rule in HF)        <50            450 pg/mL                           50 - 75            900 pg/mL                               >75           1800 pg/mL Exclusion (rule out HF)  Age independent     300 pg/mL     Objective: There were no vitals filed for this visit.  General: Patient is awake, alert, oriented x 3 and in no acute distress in electric wheelchair.  Dermatology: Skin is warm and dry bilateral with a callused over ulceration Left sub-met with dry heme with reactive keratosis, To the right medial and anterior shin there is a  question over blisters multiple in nature <0.5cm with focal erythema and resolved warmth with 1+ pitting edema bilateral, no odor, There is mild keratosis at left medial heel with no signs of infection. Nails x 10 mycotic but short.    Vascular: Dorsalis Pedis pulse = 1/4 Bilateral,  Posterior Tibial pulse = 0/4 Bilateral,  Capillary Fill Time < 5 seconds  Neurologic: Protective sensation absent bilateral using the 5.07/10g Semmes Weinstein Monofilament.  Musculosketal: No pain with palpation to ulcerated areas. Pes cavus foot type with prominent first metatarsal head bilateral, left greater than right. Hammertoes with Muscle weakness requiring bracing bilateral from Charcot-Marie-Tooth.  Assessment and Plan:  Problem List Items Addressed This Visit      Nervous and Auditory   CHARCOT-MARIE-TOOTH DISEASE    Other Visit Diagnoses    Blister of right lower extremity, subsequent encounter    -  Primary   now crusted over   Cellulitis of right leg       much improved   PVD (peripheral vascular disease) ([place])       Foot ulcer,  left, limited to breakdown of skin ([place])       Prematurely healed   Type 2 diabetes, controlled, with neuropathy (HCC)       Pes cavus, congenital       Abnormality of gait         -Examined patient and discussed the progression of Recurrent wound and right leg blisters -PO antibiotic completed -Mechanically debrided injury of ulceration with sterile chisel blade.  Upon completing debridement there was no opening noted at the left toe ulceration however there was significant dry skin and pre-ulcerative tissue present.  Cleansed ulcertations then applied betadine dressing to left foot and antibiotic cream to right leg all covered with dry dressing and ACE wraps to knees; Patient's daughter and home nursing [facility] ( will reorder) to assist patient with dressing changes consisting of the same.  Continue with Bactroban to use to right leg.  -Patient is awaiting  consideration for Artacent 63m disk. Since history of recurrence in the setting of diabetes with neuropathy -Continue with custom shoes and braces and orthotics   -Continue with skin emollients to keratotic heel and dry skin areas -Continue with gabapentin for neuropathy -Patient to return to office for ulcer check in 2-3 weeks.  TLandis Martins DPM

## 2016-12-20 ENCOUNTER — Telehealth: Payer: Self-pay | Admitting: Sports Medicine

## 2016-12-20 NOTE — Telephone Encounter (Signed)
This is HaematologistChristy with [facility]. I needed to see what the frequency of Mr. [name]'s dressing changes would be. We are planning to go out and see him tomorrow if the pt is allowable to that. Please call me back at [phone]. Thank you.

## 2016-12-20 NOTE — Telephone Encounter (Signed)
Thank you :)

## 2016-12-20 NOTE — Telephone Encounter (Signed)
I informed David HammingChristy - [facility] that in notes it appeared pt's dtr would be assisting pt with dressing changes and the [facility]HC could see pt for wound care 3 times week.

## 2016-12-21 DIAGNOSIS — I739 Peripheral vascular disease, unspecified: Secondary | ICD-10-CM | POA: Diagnosis not present

## 2016-12-27 ENCOUNTER — Telehealth: Payer: Self-pay | Admitting: Sports Medicine

## 2016-12-27 NOTE — Telephone Encounter (Signed)
I informed Gearldine BienenstockBrandy - Profore was to be continued and at 3 times a week.

## 2016-12-27 NOTE — Telephone Encounter (Signed)
Hi this is [name] a Engineer, civil (consulting)nurse with [facility]. I'm just needing to get clarification on his orders. When I saw him his blisters were not opened. Two were scabbed and one was completely closed, there was no openness to it. I was calling to see if we were going to use pro for daily or if we were okay to continue to use ace wraps daily and just gauze covering over the scabbed areas and continue the antibiotic cream. My number is [phone]. Thank you.

## 2016-12-28 ENCOUNTER — Telehealth: Payer: Self-pay | Admitting: Sports Medicine

## 2016-12-28 DIAGNOSIS — I739 Peripheral vascular disease, unspecified: Secondary | ICD-10-CM | POA: Diagnosis not present

## 2016-12-28 NOTE — Telephone Encounter (Signed)
This is [name] with [facility]. Sorry to bother you again. I'm calling on Mr. David CostaBible. [name], I know we spoke yesterday and clarified the frequency on those wraps and I did not clarify wether or not the family could be taught to do those. I know the daughters have been doing the wound care and are more than willing to learn how to do them. I just need to know so I can confirm my frequency on him. I went to try and see him yesterday and his daughter had already wrapped the leg so I did not see him yesterday but I will see him today. If you could give me a call back at [phone]. Thank you.

## 2016-12-28 NOTE — Telephone Encounter (Signed)
I read orders from 12/19/2016 and 12/20/2016 [facility].

## 2016-12-29 DIAGNOSIS — J9610 Chronic respiratory failure, unspecified whether with hypoxia or hypercapnia: Secondary | ICD-10-CM | POA: Diagnosis not present

## 2017-01-02 DIAGNOSIS — G600 Hereditary motor and sensory neuropathy: Secondary | ICD-10-CM | POA: Diagnosis not present

## 2017-01-02 DIAGNOSIS — I5032 Chronic diastolic (congestive) heart failure: Secondary | ICD-10-CM | POA: Diagnosis not present

## 2017-01-02 DIAGNOSIS — Z4800 Encounter for change or removal of nonsurgical wound dressing: Secondary | ICD-10-CM | POA: Diagnosis not present

## 2017-01-02 DIAGNOSIS — E1140 Type 2 diabetes mellitus with diabetic neuropathy, unspecified: Secondary | ICD-10-CM | POA: Diagnosis not present

## 2017-01-02 DIAGNOSIS — I2510 Atherosclerotic heart disease of native coronary artery without angina pectoris: Secondary | ICD-10-CM | POA: Diagnosis not present

## 2017-01-02 DIAGNOSIS — I110 Hypertensive heart disease with heart failure: Secondary | ICD-10-CM | POA: Diagnosis not present

## 2017-01-02 DIAGNOSIS — Q667 Congenital pes cavus: Secondary | ICD-10-CM | POA: Diagnosis not present

## 2017-01-02 DIAGNOSIS — E1151 Type 2 diabetes mellitus with diabetic peripheral angiopathy without gangrene: Secondary | ICD-10-CM | POA: Diagnosis not present

## 2017-01-02 DIAGNOSIS — S80821D Blister (nonthermal), right lower leg, subsequent encounter: Secondary | ICD-10-CM | POA: Diagnosis not present

## 2017-01-04 DIAGNOSIS — I2510 Atherosclerotic heart disease of native coronary artery without angina pectoris: Secondary | ICD-10-CM | POA: Diagnosis not present

## 2017-01-04 DIAGNOSIS — I5032 Chronic diastolic (congestive) heart failure: Secondary | ICD-10-CM | POA: Diagnosis not present

## 2017-01-04 DIAGNOSIS — Z4800 Encounter for change or removal of nonsurgical wound dressing: Secondary | ICD-10-CM | POA: Diagnosis not present

## 2017-01-04 DIAGNOSIS — I110 Hypertensive heart disease with heart failure: Secondary | ICD-10-CM | POA: Diagnosis not present

## 2017-01-04 DIAGNOSIS — Q667 Congenital pes cavus: Secondary | ICD-10-CM | POA: Diagnosis not present

## 2017-01-04 DIAGNOSIS — S80821D Blister (nonthermal), right lower leg, subsequent encounter: Secondary | ICD-10-CM | POA: Diagnosis not present

## 2017-01-04 DIAGNOSIS — E1151 Type 2 diabetes mellitus with diabetic peripheral angiopathy without gangrene: Secondary | ICD-10-CM | POA: Diagnosis not present

## 2017-01-04 DIAGNOSIS — G600 Hereditary motor and sensory neuropathy: Secondary | ICD-10-CM | POA: Diagnosis not present

## 2017-01-04 DIAGNOSIS — E1140 Type 2 diabetes mellitus with diabetic neuropathy, unspecified: Secondary | ICD-10-CM | POA: Diagnosis not present

## 2017-01-07 ENCOUNTER — Ambulatory Visit: Payer: Medicare HMO | Admitting: Podiatry

## 2017-01-07 DIAGNOSIS — L97521 Non-pressure chronic ulcer of other part of left foot limited to breakdown of skin: Secondary | ICD-10-CM

## 2017-01-07 MED ORDER — CLINDAMYCIN HCL 150 MG PO CAPS: 150.0000 mg | ORAL_CAPSULE | Freq: Two times a day (BID) | ORAL | 0 refills | Status: DC

## 2017-01-07 MED ORDER — CIPROFLOXACIN HCL 250 MG PO TABS
250.0000 mg | ORAL_TABLET | Freq: Two times a day (BID) | ORAL | 0 refills | Status: DC
Start: 2017-01-07 — End: 2017-02-05

## 2017-01-08 ENCOUNTER — Telehealth: Payer: Self-pay | Admitting: Sports Medicine

## 2017-01-08 ENCOUNTER — Telehealth: Payer: Self-pay | Admitting: Endocrinology

## 2017-01-08 DIAGNOSIS — E1151 Type 2 diabetes mellitus with diabetic peripheral angiopathy without gangrene: Secondary | ICD-10-CM | POA: Diagnosis not present

## 2017-01-08 DIAGNOSIS — G600 Hereditary motor and sensory neuropathy: Secondary | ICD-10-CM | POA: Diagnosis not present

## 2017-01-08 DIAGNOSIS — I2510 Atherosclerotic heart disease of native coronary artery without angina pectoris: Secondary | ICD-10-CM | POA: Diagnosis not present

## 2017-01-08 DIAGNOSIS — S80821D Blister (nonthermal), right lower leg, subsequent encounter: Secondary | ICD-10-CM | POA: Diagnosis not present

## 2017-01-08 DIAGNOSIS — Z4800 Encounter for change or removal of nonsurgical wound dressing: Secondary | ICD-10-CM | POA: Diagnosis not present

## 2017-01-08 DIAGNOSIS — Q667 Congenital pes cavus: Secondary | ICD-10-CM | POA: Diagnosis not present

## 2017-01-08 DIAGNOSIS — E1140 Type 2 diabetes mellitus with diabetic neuropathy, unspecified: Secondary | ICD-10-CM | POA: Diagnosis not present

## 2017-01-08 DIAGNOSIS — I110 Hypertensive heart disease with heart failure: Secondary | ICD-10-CM | POA: Diagnosis not present

## 2017-01-08 DIAGNOSIS — I5032 Chronic diastolic (congestive) heart failure: Secondary | ICD-10-CM | POA: Diagnosis not present

## 2017-01-08 NOTE — Telephone Encounter (Signed)
Hey this is Armed forces operational officerBrandy the nurse at [facility]. I spoke with him yesterday evening and he said he had come in and that Dr. [name] had opened up a spot on his left foot. I was calling to see if there were any new orders? My number is [phone]. Thank you.

## 2017-01-08 NOTE — Telephone Encounter (Signed)
He saw Dr. Samuella CotaPrice yesterday. Please check with him to see if there any updates to wound care -Dr. SKathie Rhodes

## 2017-01-10 ENCOUNTER — Encounter: Payer: Self-pay | Admitting: Sports Medicine

## 2017-01-10 ENCOUNTER — Ambulatory Visit: Payer: Medicare HMO | Admitting: Cardiovascular Disease

## 2017-01-10 ENCOUNTER — Encounter: Payer: Self-pay | Admitting: Cardiovascular Disease

## 2017-01-10 ENCOUNTER — Ambulatory Visit: Payer: Medicare HMO | Admitting: Sports Medicine

## 2017-01-10 VITALS — BP 114/50 | HR 71 | Ht 70.0 in | Wt 192.0 lb

## 2017-01-10 DIAGNOSIS — E1140 Type 2 diabetes mellitus with diabetic neuropathy, unspecified: Secondary | ICD-10-CM

## 2017-01-10 DIAGNOSIS — L97521 Non-pressure chronic ulcer of other part of left foot limited to breakdown of skin: Secondary | ICD-10-CM | POA: Diagnosis not present

## 2017-01-10 DIAGNOSIS — I5032 Chronic diastolic (congestive) heart failure: Secondary | ICD-10-CM | POA: Diagnosis not present

## 2017-01-10 DIAGNOSIS — Q667 Congenital pes cavus, unspecified foot: Secondary | ICD-10-CM

## 2017-01-10 DIAGNOSIS — I471 Supraventricular tachycardia: Secondary | ICD-10-CM | POA: Diagnosis not present

## 2017-01-10 DIAGNOSIS — G600 Hereditary motor and sensory neuropathy: Secondary | ICD-10-CM

## 2017-01-10 DIAGNOSIS — I739 Peripheral vascular disease, unspecified: Secondary | ICD-10-CM

## 2017-01-10 DIAGNOSIS — I10 Essential (primary) hypertension: Secondary | ICD-10-CM

## 2017-01-10 DIAGNOSIS — R269 Unspecified abnormalities of gait and mobility: Secondary | ICD-10-CM

## 2017-01-10 DIAGNOSIS — I25810 Atherosclerosis of coronary artery bypass graft(s) without angina pectoris: Secondary | ICD-10-CM | POA: Diagnosis not present

## 2017-01-10 NOTE — Progress Notes (Signed)
Patient ID: [name], male   DOB: 1931/09/30, 84 y.o.   MRN: [id number]  Subjective: [name] is a 84 y.o. male patient seen in office for follow up evaluation of left foot, sub-met 1 ulcer.  Patient reports that he was seen by Dr. March Rummage on Monday and was started on clindamycin and Cipro for infection states that the wound at that time had a bad odor and there was significant swelling warmth and redness to the area.  Patient reports that he is doing ok today and that the redness odor and drainage has significantly improved since being on antibiotics.  Denies chest pain, nausea, vomiting, fever, chills, night sweats. Patient has no other pedal complaints at this time.  Patient is assisted by his daughter and son-in-law this visit who has been changing dressings using Betadine and reports that nursing has been coming once a week to do the same.  Patient Active Problem List   Diagnosis Date Noted  . Acute diastolic CHF (congestive heart failure) ([place]) 12/04/2016  . S/P shoulder replacement, left 11/16/2016  . Preoperative clearance 10/15/2016  . Hyperlipidemia 10/15/2016  . Diabetes ([place]) 03/10/2015  . Perennial and seasonal allergic rhinitis 12/20/2014  . Hematuria 12/20/2014  . Loss of weight 05/06/2014  . Other pancytopenia ([place]) 05/05/2014  . CAP (community acquired pneumonia) 05/05/2014  . Hypocalcemia 05/05/2014  . Skin lesion 04/30/2014  . 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 Abdominal pain, right upper quadrant 05/04/2013  . Cold intolerance 05/04/2013  . Low back pain 04/03/2012  . Encounter for long-term (current) use of other medications 12/11/2011  . Routine general medical examination at a health care facility 12/17/2010  . Screening for prostate cancer 12/12/2010  . Osteoarthrosis, unspecified whether generalized or localized, unspecified site 10/24/2010  . SKIN RASH 06/29/2009  . Iron deficiency anemia 03/10/2009  . BACK PAIN, LUMBAR 03/10/2009  .  [place] DISEASE 05/03/2008  . DYSPNEA 05/03/2008  . DEPRESSIVE DISORDER NOT ELSEWHERE CLASSIFIED 05/22/2007  . CHEST PAIN 05/22/2007  . ANEMIA, PERNICIOUS 12/02/2006  . Essential hypertension 07/26/2006  . Coronary atherosclerosis 07/26/2006  . GERD 07/26/2006   Current Outpatient Medications on File Prior to Visit  Medication Sig Dispense Refill  . acetaminophen (TYLENOL) 500 MG tablet Take 1,000-1,500 mg 2 (two) times daily as needed by mouth for moderate pain.    . Ascorbic Acid (VITAMIN C) 500 MG tablet Take 500 mg by mouth 2 (two) times daily.     Marland Kitchen aspirin (GOODSENSE ASPIRIN) 325 MG tablet Take 325 mg daily by mouth.     . Blood Glucose Calibration (ADVOCATE REDI-CODE+ CONTROL) LOW SOLN   3  . Blood Glucose Monitoring Suppl (ADVOCATE REDI-CODE+) DEVI   1  . cholecalciferol (VITAMIN D) 1000 units tablet Take 1,000 Units at bedtime by mouth.    . ciprofloxacin (CIPRO) 250 MG tablet Take 1 tablet (250 mg total) by mouth 2 (two) times daily. 10 tablet 0  . CLEVER CHEK AUTO-CODE VOICE test strip 1 each by Other route as directed.     . clindamycin (CLEOCIN) 150 MG capsule Take 1 capsule (150 mg total) by mouth 2 (two) times daily. 10 capsule 0  . Cyanocobalamin (B-12 PO) Take 1 tablet daily by mouth.    . Cyanocobalamin (VITAMIN B-12 IJ) GET A B12 INJECTION ONCE EVERY MONTH     . doxycycline (VIBRA-TABS) 100 MG tablet Take 1 tablet (100 mg total) by mouth 2 (two) times daily. 20 tablet 0  . ferrous sulfate 325 (  65 FE) MG tablet Take 325 mg 2 (two) times daily by mouth.    . gabapentin (NEURONTIN) 300 MG capsule Take 2 capsules (600 mg total) by mouth at bedtime. 180 capsule 3  . Inositol Niacinate (NIACIN FLUSH FREE) 500 MG CAPS Take 500 mg at bedtime by mouth.     Elmore Guise Devices (SIMPLE DIAGNOSTICS LANCING DEV) MISC   1  . metFORMIN (GLUCOPHAGE-XR) 500 MG 24 hr tablet Take 500 mg by mouth 2 (two) times daily.    . montelukast (SINGULAIR) 10 MG tablet TAKE 1 TABLET BY MOUTH  AT  BEDTIME 90 tablet 3  . Multiple Vitamins-Minerals (MULTIVITAMIN WITH MINERALS) tablet Take 1 tablet by mouth daily.      . mupirocin ointment (BACTROBAN) 2 % To Right leg blisters 22 g 0  . oxyCODONE-acetaminophen (ROXICET) 5-325 MG tablet Take 1 tablet every 4 (four) hours as needed by mouth for severe pain. 30 tablet 0  . sertraline (ZOLOFT) 50 MG tablet TAKE 1 TABLET BY MOUTH  DAILY 90 tablet 3  . simvastatin (ZOCOR) 80 MG tablet TAKE 1 TABLET BY MOUTH AT  BEDTIME 90 tablet 3  . traMADol (ULTRAM) 50 MG tablet Take 50 mg by mouth 2 (two) times daily.    . Zinc 50 MG CAPS Take 50 mg at bedtime by mouth.      No current facility-administered medications on file prior to visit.    No Known Allergies  Recent Results (from the past 2160 hour(s))  Glucose, capillary     Status: Abnormal   Collection Time: 11/14/16  2:09 PM  Result Value Ref Range   Glucose-Capillary 112 (H) 65 - 99 mg/dL  CBC     Status: Abnormal   Collection Time: 11/14/16  2:58 PM  Result Value Ref Range   WBC 4.4 4.0 - 10.5 K/uL   RBC 3.97 (L) 4.22 - 5.81 MIL/uL   Hemoglobin 11.7 (L) 13.0 - 17.0 g/dL   HCT 36.4 (L) 39.0 - 52.0 %   MCV 91.7 78.0 - 100.0 fL   MCH 29.5 26.0 - 34.0 pg   MCHC 32.1 30.0 - 36.0 g/dL   RDW 13.9 11.5 - 15.5 %   Platelets 173 150 - 400 K/uL  Basic metabolic panel     Status: Abnormal   Collection Time: 11/14/16  2:58 PM  Result Value Ref Range   Sodium 135 135 - 145 mmol/L   Potassium 4.7 3.5 - 5.1 mmol/L   Chloride 102 101 - 111 mmol/L   CO2 26 22 - 32 mmol/L   Glucose, Bld 103 (H) 65 - 99 mg/dL   BUN 21 (H) 6 - 20 mg/dL   Creatinine, Ser 0.72 0.61 - 1.24 mg/dL   Calcium 9.2 8.9 - 10.3 mg/dL   GFR calc non Af Amer >60 >60 mL/min   GFR calc Af Amer >60 >60 mL/min    Comment: (NOTE) The eGFR has been calculated using the CKD EPI equation. This calculation has not been validated in all clinical situations. eGFR's persistently <60 mL/min signify possible Chronic Kidney Disease.     Anion gap 7 5 - 15  Surgical pcr screen     Status: None   Collection Time: 11/14/16  2:58 PM  Result Value Ref Range   MRSA, PCR NEGATIVE NEGATIVE   Staphylococcus aureus NEGATIVE NEGATIVE    Comment: (NOTE) The Xpert SA Assay (FDA approved for NASAL specimens in patients 1 years of age and older), is one component of a comprehensive  surveillance program. It is not intended to diagnose infection nor to guide or monitor treatment.   Hemoglobin A1c     Status: Abnormal   Collection Time: 11/14/16  2:59 PM  Result Value Ref Range   Hgb A1c MFr Bld 6.5 (H) 4.8 - 5.6 %    Comment: (NOTE) Pre diabetes:          5.7%-6.4% Diabetes:              >6.4% Glycemic control for   <7.0% adults with diabetes    Mean Plasma Glucose 139.85 mg/dL  Glucose, capillary     Status: None   Collection Time: 11/16/16  1:13 PM  Result Value Ref Range   Glucose-Capillary 94 65 - 99 mg/dL  Glucose, capillary     Status: None   Collection Time: 11/16/16  4:53 PM  Result Value Ref Range   Glucose-Capillary 98 65 - 99 mg/dL   Comment 1 Notify RN    Comment 2 Document in Chart   Glucose, capillary     Status: Abnormal   Collection Time: 11/16/16  8:59 PM  Result Value Ref Range   Glucose-Capillary 135 (H) 65 - 99 mg/dL   Comment 1 Notify RN    Comment 2 Document in Chart   Hemoglobin and hematocrit, blood     Status: Abnormal   Collection Time: 11/17/16  6:04 AM  Result Value Ref Range   Hemoglobin 10.9 (L) 13.0 - 17.0 g/dL   HCT 33.8 (L) 39.0 - 52.0 %  Glucose, capillary     Status: Abnormal   Collection Time: 11/17/16  7:58 AM  Result Value Ref Range   Glucose-Capillary 135 (H) 65 - 99 mg/dL  Basic metabolic panel     Status: Abnormal   Collection Time: 12/04/16 12:20 PM  Result Value Ref Range   Glucose 102 (H) 65 - 99 mg/dL   BUN 24 8 - 27 mg/dL   Creatinine, Ser 0.61 (L) 0.76 - 1.27 mg/dL   GFR calc non Af Amer 92 >59 mL/min/1.73   GFR calc Af Amer 107 >59 mL/min/1.73    BUN/Creatinine Ratio 39 (H) 10 - 24   Sodium 141 134 - 144 mmol/L   Potassium 4.9 3.5 - 5.2 mmol/L   Chloride 105 96 - 106 mmol/L   CO2 23 20 - 29 mmol/L   Calcium 9.0 8.6 - 10.2 mg/dL  Pro b natriuretic peptide     Status: Abnormal   Collection Time: 12/04/16 12:20 PM  Result Value Ref Range   NT-Pro BNP 1,334 (H) 0 - 486 pg/mL    Comment: The following cut-points have been suggested for the use of proBNP for the diagnostic evaluation of heart failure (HF) in patients with acute dyspnea: Modality                     Age           Optimal Cut                            (years)            Point ------------------------------------------------------ Diagnosis (rule in HF)        <50            450 pg/mL                           50 -  75            900 pg/mL                               >75           1800 pg/mL Exclusion (rule out HF)  Age independent     300 pg/mL   Basic Metabolic Panel (BMET)     Status: Abnormal   Collection Time: 12/17/16 11:56 AM  Result Value Ref Range   Glucose 109 (H) 65 - 99 mg/dL   BUN 22 8 - 27 mg/dL   Creatinine, Ser 0.80 0.76 - 1.27 mg/dL   GFR calc non Af Amer 83 >59 mL/min/1.73   GFR calc Af Amer 95 >59 mL/min/1.73   BUN/Creatinine Ratio 28 (H) 10 - 24   Sodium 140 134 - 144 mmol/L   Potassium 4.8 3.5 - 5.2 mmol/L   Chloride 102 96 - 106 mmol/L   CO2 25 20 - 29 mmol/L   Calcium 9.3 8.6 - 10.2 mg/dL  Pro b natriuretic peptide (BNP)     Status: Abnormal   Collection Time: 12/17/16 11:56 AM  Result Value Ref Range   NT-Pro BNP 888 (H) 0 - 486 pg/mL    Comment: The following cut-points have been suggested for the use of proBNP for the diagnostic evaluation of heart failure (HF) in patients with acute dyspnea: Modality                     Age           Optimal Cut                            (years)            Point ------------------------------------------------------ Diagnosis (rule in HF)        <50            450 pg/mL                            50 - 75            900 pg/mL                               >75           1800 pg/mL Exclusion (rule out HF)  Age independent     300 pg/mL     Objective: There were no vitals filed for this visit.  General: Patient is awake, alert, oriented x 3 and in no acute distress in electric wheelchair.  Dermatology: Skin is warm and dry bilateral with a full-thickness ulceration left sub-met one with  granular base with reactive keratosis that measures 1.5 x 1.5 x 0.2 cm with no other surrounding signs of infection, To the right anterior shin blisters are well-healed with no acute signs of infection.  1+ pitting edema bilateral, no odor, There is mild keratosis at left medial heel with no signs of infection. Nails x 10 mycotic but short.    Vascular: Dorsalis Pedis pulse = 1/4 Bilateral,  Posterior Tibial pulse = 0/4 Bilateral,  Capillary Fill Time < 5 seconds  Neurologic: Protective sensation absent bilateral using the 5.07/10g Semmes Weinstein Monofilament.  Musculosketal: No pain with palpation to ulcerated  areas. Pes cavus foot type with prominent first metatarsal head bilateral, left greater than right. Hammertoes with Muscle weakness requiring bracing bilateral from Charcot-Marie-Tooth.  Assessment and Plan:  Problem List Items Addressed This Visit      Nervous and Auditory   CHARCOT-MARIE-TOOTH DISEASE    Other Visit Diagnoses    Foot ulcer, left, limited to breakdown of skin (HCC)    -  Primary   Pes cavus, congenital       Abnormality of gait       Type 2 diabetes, controlled, with neuropathy (HCC)       PVD (peripheral vascular disease) ([place])         -Examined patient and discussed the progression of Recurrent wound left sub-met one -Continue with oral antibiotics -Mechanically debridedleft sub-met one ulceration with sterile chisel blade.  Upon completing debridement to healthy bleeding tissue.  Cleansed ulcertation then applied Iodosorb dressing to left covered with dry  dressings.  Patient's daughter and home nursing via well care to assist patient with dressing changes consisting of the same.  -Continue with custom shoes and braces and orthotics   -Continue with skin emollients to keratotic heel and dry skin areas -Continue with gabapentin for neuropathy -Patient to return to office for ulcer check in 1 week.  Landis Martins, DPM

## 2017-01-10 NOTE — Progress Notes (Signed)
Chief Complaint  Patient presents with  . Coronary Artery Disease    History of Present Illness: 84 yo male with history of chronic diastolic CHF, CAD with 5V CABG in 1995 with most recent cath 2006, DM, HTN, former smoker, Charcot Marie tooth syndrome who is here today for cardiac follow up. I met him in April 2012 and he had c/o chest pains. Stress myoview April 2012 with no ischemia. I saw him 12/24/12 and there was question of atrial fibrillation in primary care. I arranged a 48 hour monitor which showed sinus rhythm with frequent PACs, several runs of SVT. Echo 01/12/13 with normal LV size and function, mild AI and TR. Probable mild pulm HTN. He could not lay flat for stress myoview images due to diaphragm issues, chronic problem. Seen by Dr. Melvyn Novas and note states he has no lung issues. He was treated for GERD. He underwent a shoulder surgery in November 2018 and had volume overload following the procedure. He was diuresed with Lasix. Echo December 2018 with normal LV size and normal LV systolic function, JJKK=93-81%. There was mild aortic valve disease with thickened leaflets and mild AI. Following diuresis with daily Lasix, he had dizziness so Lasix cut back to as needed dosing.   He is here today for follow up. The patient denies any chest pain, dyspnea, palpitations, lower extremity edema, orthopnea, PND, dizziness, near syncope or syncope.   Primary Care Physician: Renato Shin, MD  Past Medical History:  Diagnosis Date  . ANEMIA, IRON DEFICIENCY   . Arthritis   . BACK PAIN, LUMBAR   . Charcot-Marie-Tooth disease    assoiciated muscular dystrophy   . COPD (chronic obstructive pulmonary disease) ([place])   . Coronary artery disease   . DIABETES MELLITUS, TYPE II   . GERD (gastroesophageal reflux disease)   . H/O: asbestos exposure   . Hypertension   . Leukopenia   . Myocardial infarction ([place])   . Pernicious anemia   . Pneumonia   . Sleep apnea   . Smoker    quit 1968--25 ppy      Past Surgical History:  Procedure Laterality Date  . CATARACT EXTRACTION W/ INTRAOCULAR LENS  IMPLANT, BILATERAL    . CORONARY ARTERY BYPASS GRAFT  1995  . CYST EXCISION     on right shoulder  . REVERSE SHOULDER ARTHROPLASTY Left 11/16/2016   Procedure: LEFT REVERSE SHOULDER ARTHROPLASTY;  Surgeon: Netta Cedars, MD;  Location: [facility];  Service: Orthopedics;  Laterality: Left;    Current Outpatient Medications  Medication Sig Dispense Refill  . acetaminophen (TYLENOL) 500 MG tablet Take 1,000-1,500 mg 2 (two) times daily as needed by mouth for moderate pain.    . Ascorbic Acid (VITAMIN C) 500 MG tablet Take 500 mg by mouth 2 (two) times daily.     Marland Kitchen aspirin (GOODSENSE ASPIRIN) 325 MG tablet Take 325 mg daily by mouth.     . Blood Glucose Calibration (ADVOCATE REDI-CODE+ CONTROL) LOW SOLN   3  . Blood Glucose Monitoring Suppl (ADVOCATE REDI-CODE+) DEVI   1  . cholecalciferol (VITAMIN D) 1000 units tablet Take 1,000 Units at bedtime by mouth.    . ciprofloxacin (CIPRO) 250 MG tablet Take 1 tablet (250 mg total) by mouth 2 (two) times daily. 10 tablet 0  . CLEVER CHEK AUTO-CODE VOICE test strip 1 each by Other route as directed.     . clindamycin (CLEOCIN) 150 MG capsule Take 1 capsule (150 mg total) by mouth 2 (two) times daily.  10 capsule 0  . Cyanocobalamin (B-12 PO) Take 1 tablet daily by mouth.    . Cyanocobalamin (VITAMIN B-12 IJ) GET A B12 INJECTION ONCE EVERY MONTH     . doxycycline (VIBRA-TABS) 100 MG tablet Take 1 tablet (100 mg total) by mouth 2 (two) times daily. 20 tablet 0  . ferrous sulfate 325 (65 FE) MG tablet Take 325 mg 2 (two) times daily by mouth.    . gabapentin (NEURONTIN) 300 MG capsule Take 2 capsules (600 mg total) by mouth at bedtime. 180 capsule 3  . Inositol Niacinate (NIACIN FLUSH FREE) 500 MG CAPS Take 500 mg at bedtime by mouth.     Elmore Guise Devices (SIMPLE DIAGNOSTICS LANCING DEV) MISC   1  . metFORMIN (GLUCOPHAGE-XR) 500 MG 24 hr tablet Take 500 mg by  mouth 2 (two) times daily.    . montelukast (SINGULAIR) 10 MG tablet TAKE 1 TABLET BY MOUTH AT  BEDTIME 90 tablet 3  . Multiple Vitamins-Minerals (MULTIVITAMIN WITH MINERALS) tablet Take 1 tablet by mouth daily.      . mupirocin ointment (BACTROBAN) 2 % To Right leg blisters 22 g 0  . oxyCODONE-acetaminophen (ROXICET) 5-325 MG tablet Take 1 tablet every 4 (four) hours as needed by mouth for severe pain. 30 tablet 0  . sertraline (ZOLOFT) 50 MG tablet TAKE 1 TABLET BY MOUTH  DAILY 90 tablet 3  . simvastatin (ZOCOR) 80 MG tablet TAKE 1 TABLET BY MOUTH AT  BEDTIME 90 tablet 3  . traMADol (ULTRAM) 50 MG tablet Take 50 mg by mouth 2 (two) times daily.    . Zinc 50 MG CAPS Take 50 mg at bedtime by mouth.      No current facility-administered medications for this visit.     No Known Allergies  Social History   Socioeconomic History  . Marital status: Widowed    Spouse name: Not on file  . Number of children: Not on file  . Years of education: Not on file  . Highest education level: Not on file  Social Needs  . Financial resource strain: Not on file  . Food insecurity - worry: Not on file  . Food insecurity - inability: Not on file  . Transportation needs - medical: Not on file  . Transportation needs - non-medical: Not on file  Occupational History  . Occupation: Retired    Fish farm manager: RETIRED    Comment: Engineer, building services  Tobacco Use  . Smoking status: Former Smoker    Packs/day: 1.00    Years: 20.00    Pack years: 20.00    Types: Cigarettes    Last attempt to quit: 01/01/1966    Years since quitting: 51.0  . Smokeless tobacco: Never Used  Substance and Sexual Activity  . Alcohol use: No    Alcohol/week: 0.0 oz  . Drug use: No  . Sexual activity: Not on file  Other Topics Concern  . Not on file  Social History Narrative   Was in service during [place] War.  Lives alone in a one story home.  On disability.         Family History  Problem Relation Age of Onset  .  Charcot-Marie-Tooth disease Sister   . Charcot-Marie-Tooth disease Brother   . Charcot-Marie-Tooth disease Brother   . Charcot-Marie-Tooth disease Brother   . Charcot-Marie-Tooth disease Sister   . Charcot-Marie-Tooth disease Son   . Charcot-Marie-Tooth disease Other   . COPD Daughter        never smoker  Review of Systems:  As stated in the HPI and otherwise negative.   BP (!) 114/50   Pulse 71   Ht '5\' 10"'$  (1.778 m)   Wt 192 lb (87.1 kg)   SpO2 97%   BMI 27.55 kg/m   Physical Examination:  General: Well developed, well nourished, NAD  HEENT: OP clear, mucus membranes moist  SKIN: warm, dry. No rashes. Neuro: No focal deficits  Musculoskeletal: Muscle strength 5/5 all ext  Psychiatric: Mood and affect normal  Neck: No JVD, no carotid bruits, no thyromegaly, no lymphadenopathy.  Lungs:Clear bilaterally, no wheezes, rhonci, crackles Cardiovascular: Regular rate and rhythm. No murmurs, gallops or rubs. Abdomen:Soft. Bowel sounds present. Non-tender.  Extremities: No lower extremity edema. Pulses are 2 + in the bilateral DP/PT.  Echo 12/17/16: - Left ventricle: The cavity size was normal. Wall thickness was   normal. Systolic function was normal. The estimated ejection   fraction was in the range of 60% to 65%. Wall motion was normal;   there were no regional wall motion abnormalities. Left   ventricular diastolic function parameters were normal for the   patient&'s age. - Aortic valve: Moderately calcified annulus. Mildly thickened,   mildly calcified leaflets. There was mild regurgitation. Valve   area (VTI): 2.04 cm^2. Valve area (Vmax): 2.02 cm^2. Valve area   (Vmean): 1.85 cm^2. - Right atrium: The atrium was mildly dilated.  EKG:  EKG is not   ordered today. The ekg ordered today demonstrates   Recent Labs: 03/05/2016: ALT 17; TSH 1.61 11/14/2016: Platelets 173 11/17/2016: Hemoglobin 10.9 12/17/2016: BUN 22; Creatinine, Ser 0.80; NT-Pro BNP 888; Potassium 4.8;  Sodium 140   Lipid Panel    Component Value Date/Time   CHOL 147 03/05/2016 1042   TRIG 93.0 03/05/2016 1042   HDL 56.00 03/05/2016 1042   CHOLHDL 3 03/05/2016 1042   VLDL 18.6 03/05/2016 1042   LDLCALC 72 03/05/2016 1042     Wt Readings from Last 3 Encounters:  01/10/17 192 lb (87.1 kg)  12/17/16 183 lb (83 kg)  12/04/16 195 lb (88.5 kg)     Other studies Reviewed: Additional studies/ records that were reviewed today include: . Review of the above records demonstrates:    Assessment and Plan:    1. CAD s/p CABG without angina: He has no chest pain suggestive of angina. He is s/p CABG in 1995. Will continue ASA and statin. No beta blocker with bradycardia.    2. SVT: No recent palpitations. No beta blocker has been used due to his resting bradycardia.    3. Chronic diastolic CHF: Weight is stable today. His fluid status is normal. Continue to use lasix as needed.   4. HTN: BP is controlled. No changes today.   Current medicines are reviewed at length with the patient today.  The patient does not have concerns regarding medicines.  The following changes have been made:  no change  Labs/ tests ordered today include:  No orders of the defined types were placed in this encounter.   Disposition:   FU with me in 12  months  Signed, Lauree Chandler, MD 01/10/2017 4:48 PM    [facility] [address], [place], [place]  [address] Phone: [phone]; Fax: [phone]

## 2017-01-10 NOTE — Patient Instructions (Signed)
Medication Instructions:  Your physician recommends that you continue on your current medications as directed. Please refer to the Current Medication list given to you today.   Labwork: none  Testing/Procedures: none  Follow-Up: Your physician recommends that you schedule a follow-up appointment in: 12 months. Please call us in about 9 months to schedule this appointment.     Any Other Special Instructions Will Be Listed Below (If Applicable).     If you need a refill on your cardiac medications before your next appointment, please call your pharmacy.   

## 2017-01-11 ENCOUNTER — Telehealth: Payer: Self-pay | Admitting: *Deleted

## 2017-01-11 NOTE — Telephone Encounter (Signed)
-----   Message from [name], [place] sent at 01/10/2017  4:38 PM EST ----- Regarding: Wound care orders Please apply to the left foot ulceration 3 times per week Iodosorb covered with dry sterile dressing  Wound is healed on the right and dressings are no longer needed on this side  -Dr SKathie Rhodes

## 2017-01-11 NOTE — Telephone Encounter (Signed)
Faxed copy of Dr. Wynema BirchStover's 01/10/2017 4:38pm order to [facility].

## 2017-01-14 DIAGNOSIS — E1140 Type 2 diabetes mellitus with diabetic neuropathy, unspecified: Secondary | ICD-10-CM | POA: Diagnosis not present

## 2017-01-14 DIAGNOSIS — E1151 Type 2 diabetes mellitus with diabetic peripheral angiopathy without gangrene: Secondary | ICD-10-CM | POA: Diagnosis not present

## 2017-01-14 DIAGNOSIS — G600 Hereditary motor and sensory neuropathy: Secondary | ICD-10-CM | POA: Diagnosis not present

## 2017-01-14 DIAGNOSIS — I2510 Atherosclerotic heart disease of native coronary artery without angina pectoris: Secondary | ICD-10-CM | POA: Diagnosis not present

## 2017-01-14 DIAGNOSIS — S80821D Blister (nonthermal), right lower leg, subsequent encounter: Secondary | ICD-10-CM | POA: Diagnosis not present

## 2017-01-14 DIAGNOSIS — I5032 Chronic diastolic (congestive) heart failure: Secondary | ICD-10-CM | POA: Diagnosis not present

## 2017-01-14 DIAGNOSIS — Z4800 Encounter for change or removal of nonsurgical wound dressing: Secondary | ICD-10-CM | POA: Diagnosis not present

## 2017-01-14 DIAGNOSIS — Q667 Congenital pes cavus: Secondary | ICD-10-CM | POA: Diagnosis not present

## 2017-01-14 DIAGNOSIS — I110 Hypertensive heart disease with heart failure: Secondary | ICD-10-CM | POA: Diagnosis not present

## 2017-01-16 ENCOUNTER — Ambulatory Visit: Payer: Medicare Other | Admitting: Sports Medicine

## 2017-01-16 ENCOUNTER — Encounter: Payer: Self-pay | Admitting: Sports Medicine

## 2017-01-16 DIAGNOSIS — Q667 Congenital pes cavus, unspecified foot: Secondary | ICD-10-CM

## 2017-01-16 DIAGNOSIS — L97521 Non-pressure chronic ulcer of other part of left foot limited to breakdown of skin: Secondary | ICD-10-CM | POA: Diagnosis not present

## 2017-01-16 DIAGNOSIS — R269 Unspecified abnormalities of gait and mobility: Secondary | ICD-10-CM

## 2017-01-16 DIAGNOSIS — G600 Hereditary motor and sensory neuropathy: Secondary | ICD-10-CM

## 2017-01-16 DIAGNOSIS — E1140 Type 2 diabetes mellitus with diabetic neuropathy, unspecified: Secondary | ICD-10-CM

## 2017-01-16 DIAGNOSIS — I739 Peripheral vascular disease, unspecified: Secondary | ICD-10-CM

## 2017-01-16 NOTE — Progress Notes (Signed)
Patient ID: [name], male   DOB: 1931-04-01, 84 y.o.   MRN: [id number]  Subjective: [name] is a 84 y.o. male patient seen in office for follow up evaluation of left foot, sub-met 1 ulcer.  Patient reports that he has finished clindamycin and Cipro for infection states that the wound at that he is doing ok today and wound is looking better and that the redness odor and drainage has significantly improved since finishing antibiotics.  Denies chest pain, nausea, vomiting, fever, chills, night sweats. Patient has no other pedal complaints at this time.  Patient is assisted by his daughter and brother this visit who has been changing dressings using Betadine and reports that nursing has been coming 3x a week to do the same using the iodosorb.  Patient Active Problem List   Diagnosis Date Noted  . Acute diastolic CHF (congestive heart failure) ([place]) 12/04/2016  . S/P shoulder replacement, left 11/16/2016  . Preoperative clearance 10/15/2016  . Hyperlipidemia 10/15/2016  . Diabetes ([place]) 03/10/2015  . Perennial and seasonal allergic rhinitis 12/20/2014  . Hematuria 12/20/2014  . Loss of weight 05/06/2014  . Other pancytopenia ([place]) 05/05/2014  . CAP (community acquired pneumonia) 05/05/2014  . Hypocalcemia 05/05/2014  . Skin lesion 04/30/2014  . 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 Abdominal pain, right upper quadrant 05/04/2013  . Cold intolerance 05/04/2013  . Low back pain 04/03/2012  . Encounter for long-term (current) use of other medications 12/11/2011  . Routine general medical examination at a health care facility 12/17/2010  . Screening for prostate cancer 12/12/2010  . Osteoarthrosis, unspecified whether generalized or localized, unspecified site 10/24/2010  . SKIN RASH 06/29/2009  . Iron deficiency anemia 03/10/2009  . BACK PAIN, LUMBAR 03/10/2009  . [place] DISEASE 05/03/2008  . DYSPNEA 05/03/2008  . DEPRESSIVE DISORDER NOT ELSEWHERE  CLASSIFIED 05/22/2007  . CHEST PAIN 05/22/2007  . ANEMIA, PERNICIOUS 12/02/2006  . Essential hypertension 07/26/2006  . Coronary atherosclerosis 07/26/2006  . GERD 07/26/2006   Current Outpatient Medications on File Prior to Visit  Medication Sig Dispense Refill  . acetaminophen (TYLENOL) 500 MG tablet Take 1,000-1,500 mg 2 (two) times daily as needed by mouth for moderate pain.    . Ascorbic Acid (VITAMIN C) 500 MG tablet Take 500 mg by mouth 2 (two) times daily.     Marland Kitchen aspirin (GOODSENSE ASPIRIN) 325 MG tablet Take 325 mg daily by mouth.     . Blood Glucose Calibration (ADVOCATE REDI-CODE+ CONTROL) LOW SOLN   3  . Blood Glucose Monitoring Suppl (ADVOCATE REDI-CODE+) DEVI   1  . cholecalciferol (VITAMIN D) 1000 units tablet Take 1,000 Units at bedtime by mouth.    . ciprofloxacin (CIPRO) 250 MG tablet Take 1 tablet (250 mg total) by mouth 2 (two) times daily. 10 tablet 0  . CLEVER CHEK AUTO-CODE VOICE test strip 1 each by Other route as directed.     . clindamycin (CLEOCIN) 150 MG capsule Take 1 capsule (150 mg total) by mouth 2 (two) times daily. 10 capsule 0  . Cyanocobalamin (B-12 PO) Take 1 tablet daily by mouth.    . Cyanocobalamin (VITAMIN B-12 IJ) GET A B12 INJECTION ONCE EVERY MONTH     . doxycycline (VIBRA-TABS) 100 MG tablet Take 1 tablet (100 mg total) by mouth 2 (two) times daily. 20 tablet 0  . ferrous sulfate 325 (65 FE) MG tablet Take 325 mg 2 (two) times daily by mouth.    . gabapentin (NEURONTIN) 300 MG capsule  Take 2 capsules (600 mg total) by mouth at bedtime. 180 capsule 3  . Inositol Niacinate (NIACIN FLUSH FREE) 500 MG CAPS Take 500 mg at bedtime by mouth.     Elmore Guise Devices (SIMPLE DIAGNOSTICS LANCING DEV) MISC   1  . metFORMIN (GLUCOPHAGE-XR) 500 MG 24 hr tablet Take 500 mg by mouth 2 (two) times daily.    . montelukast (SINGULAIR) 10 MG tablet TAKE 1 TABLET BY MOUTH AT  BEDTIME 90 tablet 3  . Multiple Vitamins-Minerals (MULTIVITAMIN WITH MINERALS) tablet Take 1  tablet by mouth daily.      . mupirocin ointment (BACTROBAN) 2 % To Right leg blisters 22 g 0  . oxyCODONE-acetaminophen (ROXICET) 5-325 MG tablet Take 1 tablet every 4 (four) hours as needed by mouth for severe pain. 30 tablet 0  . sertraline (ZOLOFT) 50 MG tablet TAKE 1 TABLET BY MOUTH  DAILY 90 tablet 3  . simvastatin (ZOCOR) 80 MG tablet TAKE 1 TABLET BY MOUTH AT  BEDTIME 90 tablet 3  . traMADol (ULTRAM) 50 MG tablet Take 50 mg by mouth 2 (two) times daily.    . Zinc 50 MG CAPS Take 50 mg at bedtime by mouth.      No current facility-administered medications on file prior to visit.    No Known Allergies  Recent Results (from the past 2160 hour(s))  Glucose, capillary     Status: Abnormal   Collection Time: 11/14/16  2:09 PM  Result Value Ref Range   Glucose-Capillary 112 (H) 65 - 99 mg/dL  CBC     Status: Abnormal   Collection Time: 11/14/16  2:58 PM  Result Value Ref Range   WBC 4.4 4.0 - 10.5 K/uL   RBC 3.97 (L) 4.22 - 5.81 MIL/uL   Hemoglobin 11.7 (L) 13.0 - 17.0 g/dL   HCT 36.4 (L) 39.0 - 52.0 %   MCV 91.7 78.0 - 100.0 fL   MCH 29.5 26.0 - 34.0 pg   MCHC 32.1 30.0 - 36.0 g/dL   RDW 13.9 11.5 - 15.5 %   Platelets 173 150 - 400 K/uL  Basic metabolic panel     Status: Abnormal   Collection Time: 11/14/16  2:58 PM  Result Value Ref Range   Sodium 135 135 - 145 mmol/L   Potassium 4.7 3.5 - 5.1 mmol/L   Chloride 102 101 - 111 mmol/L   CO2 26 22 - 32 mmol/L   Glucose, Bld 103 (H) 65 - 99 mg/dL   BUN 21 (H) 6 - 20 mg/dL   Creatinine, Ser 0.72 0.61 - 1.24 mg/dL   Calcium 9.2 8.9 - 10.3 mg/dL   GFR calc non Af Amer >60 >60 mL/min   GFR calc Af Amer >60 >60 mL/min    Comment: (NOTE) The eGFR has been calculated using the CKD EPI equation. This calculation has not been validated in all clinical situations. eGFR's persistently <60 mL/min signify possible Chronic Kidney Disease.    Anion gap 7 5 - 15  Surgical pcr screen     Status: None   Collection Time: 11/14/16  2:58  PM  Result Value Ref Range   MRSA, PCR NEGATIVE NEGATIVE   Staphylococcus aureus NEGATIVE NEGATIVE    Comment: (NOTE) The Xpert SA Assay (FDA approved for NASAL specimens in patients 47 years of age and older), is one component of a comprehensive surveillance program. It is not intended to diagnose infection nor to guide or monitor treatment.   Hemoglobin A1c  Status: Abnormal   Collection Time: 11/14/16  2:59 PM  Result Value Ref Range   Hgb A1c MFr Bld 6.5 (H) 4.8 - 5.6 %    Comment: (NOTE) Pre diabetes:          5.7%-6.4% Diabetes:              >6.4% Glycemic control for   <7.0% adults with diabetes    Mean Plasma Glucose 139.85 mg/dL  Glucose, capillary     Status: None   Collection Time: 11/16/16  1:13 PM  Result Value Ref Range   Glucose-Capillary 94 65 - 99 mg/dL  Glucose, capillary     Status: None   Collection Time: 11/16/16  4:53 PM  Result Value Ref Range   Glucose-Capillary 98 65 - 99 mg/dL   Comment 1 Notify RN    Comment 2 Document in Chart   Glucose, capillary     Status: Abnormal   Collection Time: 11/16/16  8:59 PM  Result Value Ref Range   Glucose-Capillary 135 (H) 65 - 99 mg/dL   Comment 1 Notify RN    Comment 2 Document in Chart   Hemoglobin and hematocrit, blood     Status: Abnormal   Collection Time: 11/17/16  6:04 AM  Result Value Ref Range   Hemoglobin 10.9 (L) 13.0 - 17.0 g/dL   HCT 33.8 (L) 39.0 - 52.0 %  Glucose, capillary     Status: Abnormal   Collection Time: 11/17/16  7:58 AM  Result Value Ref Range   Glucose-Capillary 135 (H) 65 - 99 mg/dL  Basic metabolic panel     Status: Abnormal   Collection Time: 12/04/16 12:20 PM  Result Value Ref Range   Glucose 102 (H) 65 - 99 mg/dL   BUN 24 8 - 27 mg/dL   Creatinine, Ser 0.61 (L) 0.76 - 1.27 mg/dL   GFR calc non Af Amer 92 >59 mL/min/1.73   GFR calc Af Amer 107 >59 mL/min/1.73   BUN/Creatinine Ratio 39 (H) 10 - 24   Sodium 141 134 - 144 mmol/L   Potassium 4.9 3.5 - 5.2 mmol/L    Chloride 105 96 - 106 mmol/L   CO2 23 20 - 29 mmol/L   Calcium 9.0 8.6 - 10.2 mg/dL  Pro b natriuretic peptide     Status: Abnormal   Collection Time: 12/04/16 12:20 PM  Result Value Ref Range   NT-Pro BNP 1,334 (H) 0 - 486 pg/mL    Comment: The following cut-points have been suggested for the use of proBNP for the diagnostic evaluation of heart failure (HF) in patients with acute dyspnea: Modality                     Age           Optimal Cut                            (years)            Point ------------------------------------------------------ Diagnosis (rule in HF)        <50            450 pg/mL                           50 - 75            900 pg/mL                               >  75           1800 pg/mL Exclusion (rule out HF)  Age independent     300 pg/mL   Basic Metabolic Panel (BMET)     Status: Abnormal   Collection Time: 12/17/16 11:56 AM  Result Value Ref Range   Glucose 109 (H) 65 - 99 mg/dL   BUN 22 8 - 27 mg/dL   Creatinine, Ser 0.80 0.76 - 1.27 mg/dL   GFR calc non Af Amer 83 >59 mL/min/1.73   GFR calc Af Amer 95 >59 mL/min/1.73   BUN/Creatinine Ratio 28 (H) 10 - 24   Sodium 140 134 - 144 mmol/L   Potassium 4.8 3.5 - 5.2 mmol/L   Chloride 102 96 - 106 mmol/L   CO2 25 20 - 29 mmol/L   Calcium 9.3 8.6 - 10.2 mg/dL  Pro b natriuretic peptide (BNP)     Status: Abnormal   Collection Time: 12/17/16 11:56 AM  Result Value Ref Range   NT-Pro BNP 888 (H) 0 - 486 pg/mL    Comment: The following cut-points have been suggested for the use of proBNP for the diagnostic evaluation of heart failure (HF) in patients with acute dyspnea: Modality                     Age           Optimal Cut                            (years)            Point ------------------------------------------------------ Diagnosis (rule in HF)        <50            450 pg/mL                           50 - 75            900 pg/mL                               >75           1800 pg/mL Exclusion  (rule out HF)  Age independent     300 pg/mL     Objective: There were no vitals filed for this visit.  General: Patient is awake, alert, oriented x 3 and in no acute distress in electric wheelchair.  Dermatology: Skin is warm and dry bilateral with a full-thickness ulceration left sub-met one with  granular base with reactive keratosis that measures 1.2 x 1.0 x 0.2 cm with no other surrounding signs of infection.  1+ pitting edema bilateral. Nails x 10 mycotic but short.    Vascular: Dorsalis Pedis pulse = 1/4 Bilateral,  Posterior Tibial pulse = 0/4 Bilateral,  Capillary Fill Time < 5 seconds  Neurologic: Protective sensation absent bilateral using the 5.07/10g Semmes Weinstein Monofilament.  Musculosketal: No pain with palpation to ulcerated area. Pes cavus foot type with prominent first metatarsal head bilateral, left greater than right. Hammertoes with Muscle weakness requiring bracing bilateral from Charcot-Marie-Tooth.  Assessment and Plan:  Problem List Items Addressed This Visit      Nervous and Auditory   CHARCOT-MARIE-TOOTH DISEASE    Other Visit Diagnoses    Foot ulcer, left, limited to breakdown of skin ([place])    -  Primary  Pes cavus, congenital       Abnormality of gait       Type 2 diabetes, controlled, with neuropathy (HCC)       PVD (peripheral vascular disease) ([place])         -Examined patient and discussed the progression of Recurrent wound left sub-met one -Completed oral antibiotics -Mechanically debrided left sub-met one ulceration with sterile chisel blade.  Upon completing debridement to healthy bleeding tissue.  Cleansed ulcertation then applied Iodosorb dressing to left covered with dry dressings.  Patient's daughter and home nursing via well care to assist patient with dressing changes consisting of the same.  -Continue with custom shoes and braces and offloading orthotics   -Continue with gabapentin for neuropathy -Patient to return to office for ulcer  check in 2-3 weeks. If no improvement will consider removing sesamoids and applying graft in OR; will further discuss this plan at next visit.  Landis Martins, DPM

## 2017-01-17 ENCOUNTER — Telehealth: Payer: Self-pay | Admitting: Sports Medicine

## 2017-01-17 NOTE — Telephone Encounter (Signed)
This is Field seismologistBrandy calling from [facility]. I'm calling to report that on Friday he declined a visit because Dr. [name] had just done it on Thursday and his daughter was going to do it on Saturday so he could shower before church. I did not see him yesterday as he saw you guys. If you have any questions my number is [phone]. Thank you.

## 2017-01-17 NOTE — Telephone Encounter (Signed)
Ok thanks that is fine.  -Dr. SKathie Rhodes

## 2017-01-18 DIAGNOSIS — S80821D Blister (nonthermal), right lower leg, subsequent encounter: Secondary | ICD-10-CM | POA: Diagnosis not present

## 2017-01-18 DIAGNOSIS — E1140 Type 2 diabetes mellitus with diabetic neuropathy, unspecified: Secondary | ICD-10-CM | POA: Diagnosis not present

## 2017-01-18 DIAGNOSIS — I110 Hypertensive heart disease with heart failure: Secondary | ICD-10-CM | POA: Diagnosis not present

## 2017-01-18 DIAGNOSIS — Q667 Congenital pes cavus: Secondary | ICD-10-CM | POA: Diagnosis not present

## 2017-01-18 DIAGNOSIS — I5032 Chronic diastolic (congestive) heart failure: Secondary | ICD-10-CM | POA: Diagnosis not present

## 2017-01-18 DIAGNOSIS — Z4800 Encounter for change or removal of nonsurgical wound dressing: Secondary | ICD-10-CM | POA: Diagnosis not present

## 2017-01-18 DIAGNOSIS — E1151 Type 2 diabetes mellitus with diabetic peripheral angiopathy without gangrene: Secondary | ICD-10-CM | POA: Diagnosis not present

## 2017-01-18 DIAGNOSIS — I2510 Atherosclerotic heart disease of native coronary artery without angina pectoris: Secondary | ICD-10-CM | POA: Diagnosis not present

## 2017-01-18 DIAGNOSIS — G600 Hereditary motor and sensory neuropathy: Secondary | ICD-10-CM | POA: Diagnosis not present

## 2017-01-21 DIAGNOSIS — E1140 Type 2 diabetes mellitus with diabetic neuropathy, unspecified: Secondary | ICD-10-CM | POA: Diagnosis not present

## 2017-01-21 DIAGNOSIS — E1151 Type 2 diabetes mellitus with diabetic peripheral angiopathy without gangrene: Secondary | ICD-10-CM | POA: Diagnosis not present

## 2017-01-21 DIAGNOSIS — S80821D Blister (nonthermal), right lower leg, subsequent encounter: Secondary | ICD-10-CM | POA: Diagnosis not present

## 2017-01-21 DIAGNOSIS — Q667 Congenital pes cavus: Secondary | ICD-10-CM | POA: Diagnosis not present

## 2017-01-21 DIAGNOSIS — I2510 Atherosclerotic heart disease of native coronary artery without angina pectoris: Secondary | ICD-10-CM | POA: Diagnosis not present

## 2017-01-21 DIAGNOSIS — Z4800 Encounter for change or removal of nonsurgical wound dressing: Secondary | ICD-10-CM | POA: Diagnosis not present

## 2017-01-21 DIAGNOSIS — I5032 Chronic diastolic (congestive) heart failure: Secondary | ICD-10-CM | POA: Diagnosis not present

## 2017-01-21 DIAGNOSIS — G600 Hereditary motor and sensory neuropathy: Secondary | ICD-10-CM | POA: Diagnosis not present

## 2017-01-21 DIAGNOSIS — I110 Hypertensive heart disease with heart failure: Secondary | ICD-10-CM | POA: Diagnosis not present

## 2017-01-23 DIAGNOSIS — E1140 Type 2 diabetes mellitus with diabetic neuropathy, unspecified: Secondary | ICD-10-CM | POA: Diagnosis not present

## 2017-01-23 DIAGNOSIS — S80821D Blister (nonthermal), right lower leg, subsequent encounter: Secondary | ICD-10-CM | POA: Diagnosis not present

## 2017-01-23 DIAGNOSIS — G600 Hereditary motor and sensory neuropathy: Secondary | ICD-10-CM | POA: Diagnosis not present

## 2017-01-23 DIAGNOSIS — Q667 Congenital pes cavus: Secondary | ICD-10-CM | POA: Diagnosis not present

## 2017-01-23 DIAGNOSIS — E1151 Type 2 diabetes mellitus with diabetic peripheral angiopathy without gangrene: Secondary | ICD-10-CM | POA: Diagnosis not present

## 2017-01-23 DIAGNOSIS — Z4800 Encounter for change or removal of nonsurgical wound dressing: Secondary | ICD-10-CM | POA: Diagnosis not present

## 2017-01-23 DIAGNOSIS — I110 Hypertensive heart disease with heart failure: Secondary | ICD-10-CM | POA: Diagnosis not present

## 2017-01-23 DIAGNOSIS — I2510 Atherosclerotic heart disease of native coronary artery without angina pectoris: Secondary | ICD-10-CM | POA: Diagnosis not present

## 2017-01-23 DIAGNOSIS — I5032 Chronic diastolic (congestive) heart failure: Secondary | ICD-10-CM | POA: Diagnosis not present

## 2017-01-28 ENCOUNTER — Telehealth: Payer: Self-pay | Admitting: Sports Medicine

## 2017-01-28 ENCOUNTER — Telehealth: Payer: Self-pay | Admitting: Endocrinology

## 2017-01-28 DIAGNOSIS — Z4800 Encounter for change or removal of nonsurgical wound dressing: Secondary | ICD-10-CM | POA: Diagnosis not present

## 2017-01-28 DIAGNOSIS — E1151 Type 2 diabetes mellitus with diabetic peripheral angiopathy without gangrene: Secondary | ICD-10-CM | POA: Diagnosis not present

## 2017-01-28 DIAGNOSIS — I5032 Chronic diastolic (congestive) heart failure: Secondary | ICD-10-CM | POA: Diagnosis not present

## 2017-01-28 DIAGNOSIS — I110 Hypertensive heart disease with heart failure: Secondary | ICD-10-CM | POA: Diagnosis not present

## 2017-01-28 DIAGNOSIS — Q667 Congenital pes cavus: Secondary | ICD-10-CM | POA: Diagnosis not present

## 2017-01-28 DIAGNOSIS — I2510 Atherosclerotic heart disease of native coronary artery without angina pectoris: Secondary | ICD-10-CM | POA: Diagnosis not present

## 2017-01-28 DIAGNOSIS — S80821D Blister (nonthermal), right lower leg, subsequent encounter: Secondary | ICD-10-CM | POA: Diagnosis not present

## 2017-01-28 DIAGNOSIS — G600 Hereditary motor and sensory neuropathy: Secondary | ICD-10-CM | POA: Diagnosis not present

## 2017-01-28 DIAGNOSIS — E1140 Type 2 diabetes mellitus with diabetic neuropathy, unspecified: Secondary | ICD-10-CM | POA: Diagnosis not present

## 2017-01-28 NOTE — Telephone Encounter (Signed)
[employer] sent out a new rx request they are checking the status on this order they are requesting diabetic supplies and tramadol 50 mg and symvastatin 80 mg  # [phone]

## 2017-01-28 NOTE — Telephone Encounter (Signed)
Hi, this is [name] a Engineer, civil (consulting)urse with [facility]. I'm calling to let Dr. [name] know that I did not see the pt on Friday as he declined a visit. Pt likes to shower and have his daughter change his bandages before my visits but she was busy on Friday and did not get the chance to. So she was going to do it on Saturday so he could shower for church on Sunday. I was also calling to get an okay for nursing to continue. My orders ended last week and he does not see the doctor I believe until the 6 th of next month. I would just like to know if I can do him twice a week and his daughter do him on Saturday's as that is more convenient for him or would you like nursing to do it three times a week. My number is [phone]. Thank you.

## 2017-01-28 NOTE — Telephone Encounter (Signed)
I informed Gearldine BienenstockBrandy, RN - [name] [name] stated continue wound care on pt as described. Brandy, RN states the wound looks great.

## 2017-01-30 ENCOUNTER — Other Ambulatory Visit: Payer: Self-pay

## 2017-01-31 ENCOUNTER — Telehealth: Payer: Self-pay | Admitting: Endocrinology

## 2017-01-31 DIAGNOSIS — I110 Hypertensive heart disease with heart failure: Secondary | ICD-10-CM | POA: Diagnosis not present

## 2017-01-31 DIAGNOSIS — E1151 Type 2 diabetes mellitus with diabetic peripheral angiopathy without gangrene: Secondary | ICD-10-CM | POA: Diagnosis not present

## 2017-01-31 DIAGNOSIS — Q667 Congenital pes cavus: Secondary | ICD-10-CM | POA: Diagnosis not present

## 2017-01-31 DIAGNOSIS — I2510 Atherosclerotic heart disease of native coronary artery without angina pectoris: Secondary | ICD-10-CM | POA: Diagnosis not present

## 2017-01-31 DIAGNOSIS — Z4800 Encounter for change or removal of nonsurgical wound dressing: Secondary | ICD-10-CM | POA: Diagnosis not present

## 2017-01-31 DIAGNOSIS — I5032 Chronic diastolic (congestive) heart failure: Secondary | ICD-10-CM | POA: Diagnosis not present

## 2017-01-31 DIAGNOSIS — S80821D Blister (nonthermal), right lower leg, subsequent encounter: Secondary | ICD-10-CM | POA: Diagnosis not present

## 2017-01-31 DIAGNOSIS — E1140 Type 2 diabetes mellitus with diabetic neuropathy, unspecified: Secondary | ICD-10-CM | POA: Diagnosis not present

## 2017-01-31 DIAGNOSIS — G600 Hereditary motor and sensory neuropathy: Secondary | ICD-10-CM | POA: Diagnosis not present

## 2017-01-31 NOTE — Telephone Encounter (Signed)
please call patient: There are new requirements for chronic narcotic use.  This is a procedure our practice has implemented in order to comply with [facility] requirements:  1.  Sign a controlled substance contract once a year.  2.  Urine drug screen once a year. 3.  Appointment here every 3 months.  No other medical problems will be addressed at this visit.  At that visit, the [place] controlled substance reporting system is checked.  Please advise ov for this

## 2017-01-31 NOTE — Telephone Encounter (Signed)
I called and left patient a VM to call back so I could explain to him as to why he would have to come in for appt to further get his medication.

## 2017-02-04 ENCOUNTER — Other Ambulatory Visit: Payer: Self-pay

## 2017-02-04 DIAGNOSIS — I5032 Chronic diastolic (congestive) heart failure: Secondary | ICD-10-CM | POA: Diagnosis not present

## 2017-02-04 DIAGNOSIS — I110 Hypertensive heart disease with heart failure: Secondary | ICD-10-CM | POA: Diagnosis not present

## 2017-02-04 DIAGNOSIS — Q667 Congenital pes cavus: Secondary | ICD-10-CM | POA: Diagnosis not present

## 2017-02-04 DIAGNOSIS — E1151 Type 2 diabetes mellitus with diabetic peripheral angiopathy without gangrene: Secondary | ICD-10-CM | POA: Diagnosis not present

## 2017-02-04 DIAGNOSIS — I2510 Atherosclerotic heart disease of native coronary artery without angina pectoris: Secondary | ICD-10-CM | POA: Diagnosis not present

## 2017-02-04 DIAGNOSIS — L97419 Non-pressure chronic ulcer of right heel and midfoot with unspecified severity: Secondary | ICD-10-CM | POA: Diagnosis not present

## 2017-02-04 DIAGNOSIS — E11621 Type 2 diabetes mellitus with foot ulcer: Secondary | ICD-10-CM | POA: Diagnosis not present

## 2017-02-04 DIAGNOSIS — Z4800 Encounter for change or removal of nonsurgical wound dressing: Secondary | ICD-10-CM | POA: Diagnosis not present

## 2017-02-04 DIAGNOSIS — E1140 Type 2 diabetes mellitus with diabetic neuropathy, unspecified: Secondary | ICD-10-CM | POA: Diagnosis not present

## 2017-02-04 NOTE — Telephone Encounter (Signed)
Pt stated that [employer] was faxing over a paper to us about his medication.  Pt is calling on the status of this because pt is needing traMADol (ULTRAM) 50 MG tablet  Please advise

## 2017-02-04 NOTE — Telephone Encounter (Signed)
I have called and made appt for patient tomorrow.

## 2017-02-04 NOTE — Telephone Encounter (Signed)
Please see message from last week

## 2017-02-05 ENCOUNTER — Encounter: Payer: Self-pay | Admitting: Endocrinology

## 2017-02-05 ENCOUNTER — Ambulatory Visit (INDEPENDENT_AMBULATORY_CARE_PROVIDER_SITE_OTHER): Payer: Medicare HMO | Admitting: Endocrinology

## 2017-02-05 ENCOUNTER — Other Ambulatory Visit: Payer: Self-pay

## 2017-02-05 VITALS — BP 158/62 | HR 63 | Wt 194.6 lb

## 2017-02-05 DIAGNOSIS — G8929 Other chronic pain: Secondary | ICD-10-CM | POA: Diagnosis not present

## 2017-02-05 DIAGNOSIS — Z23 Encounter for immunization: Secondary | ICD-10-CM

## 2017-02-05 DIAGNOSIS — D510 Vitamin B12 deficiency anemia due to intrinsic factor deficiency: Secondary | ICD-10-CM | POA: Diagnosis not present

## 2017-02-05 DIAGNOSIS — J9610 Chronic respiratory failure, unspecified whether with hypoxia or hypercapnia: Secondary | ICD-10-CM | POA: Diagnosis not present

## 2017-02-05 DIAGNOSIS — G600 Hereditary motor and sensory neuropathy: Secondary | ICD-10-CM | POA: Diagnosis not present

## 2017-02-05 MED ORDER — CYANOCOBALAMIN 1000 MCG/ML IJ SOLN
1000.0000 ug | Freq: Once | INTRAMUSCULAR | Status: AC
Start: 2017-02-05 — End: 2017-02-05
  Administered 2017-02-05: 1000 ug via INTRAMUSCULAR

## 2017-02-05 MED ORDER — BD SWAB SINGLE USE REGULAR PADS: MEDICATED_PAD | 11 refills | Status: AC

## 2017-02-05 MED ORDER — ACCU-CHEK AVIVA VI SOLN: 1.0000 | 0 refills | Status: AC | PRN

## 2017-02-05 MED ORDER — ACCU-CHEK SOFTCLIX LANCETS MISC: 12 refills | Status: AC

## 2017-02-05 MED ORDER — ACCU-CHEK AVIVA DEVI: 0 refills | Status: DC

## 2017-02-05 MED ORDER — TRAMADOL HCL 50 MG PO TABS: 50.0000 mg | ORAL_TABLET | Freq: Two times a day (BID) | ORAL | 5 refills | Status: DC | PRN

## 2017-02-05 MED ORDER — GLUCOSE BLOOD VI STRP: ORAL_STRIP | 12 refills | Status: DC

## 2017-02-05 NOTE — Progress Notes (Signed)
Subjective:    Patient ID: [name], male    DOB: 1931-06-08, 84 y.o.   MRN: [id number]  HPI Pt is here for chronic pain medication visit:  Indication for chronic opioid: he has many years of moderate pain at both hands, and assoc pain at both knees.   Medication and dosage: tramadol 50 mg BID PRN Non-opioid rx: tylenol PRN # pills per month: 60 Last annual UDS date: ordered today .Paincontract signed (once):  Date narcotic database last reviewed and printed (every visit).   Pain Inventory (1-10 worse): Average Pain: 4-9 Pain Right Now: 6 Pain is worse with: sleep Relief from Meds: tramadol  In the last 24 hours, has pain interfered with the following (1-10 greatest interference) ? General activity: yes  Relation with others: no Enjoyment of life: yes  What TIME of day is your pain at its worst? night    Sleep (in general): yes  Mobility/Function: Assistance device: leg braces (has CMT disease).  How many minutes can you walk? <1 Ability to climb steps? no Do you drive? no Disabled (date): 1982.   Past Medical History:  Diagnosis Date  . ANEMIA, IRON DEFICIENCY   . Arthritis   . BACK PAIN, LUMBAR   . Charcot-Marie-Tooth disease    assoiciated muscular dystrophy   . COPD (chronic obstructive pulmonary disease) (HCC)   . Coronary artery disease   . DIABETES MELLITUS, TYPE II   . GERD (gastroesophageal reflux disease)   . H/O: asbestos exposure   . Hypertension   . Leukopenia   . Myocardial infarction (HCC)   . Pernicious anemia   . Pneumonia   . Sleep apnea   . Smoker    quit 1968--25 ppy    Past Surgical History:  Procedure Laterality Date  . CATARACT EXTRACTION W/ INTRAOCULAR LENS  IMPLANT, BILATERAL    . CORONARY ARTERY BYPASS GRAFT  1995  . CYST EXCISION     on right shoulder  . REVERSE SHOULDER ARTHROPLASTY Left 11/16/2016   Procedure: LEFT REVERSE SHOULDER ARTHROPLASTY;  Surgeon: Beverely LowNorris, Steve, MD;  [facility];  Service: Orthopedics;   Laterality: Left;    Social History   Socioeconomic History  . Marital status: Widowed    Spouse name: Not on file  . Number of children: Not on file  . Years of education: Not on file  . Highest education level: Not on file  Social Needs  . Financial resource strain: Not on file  . Food insecurity - worry: Not on file  . Food insecurity - inability: Not on file  . Transportation needs - medical: Not on file  . Transportation needs - non-medical: Not on file  Occupational History  . Occupation: Retired    Associate Professormployer: RETIRED    Comment: Games developerdiesel mechanic  Tobacco Use  . Smoking status: Former Smoker    Packs/day: 1.00    Years: 20.00    Pack years: 20.00    Types: Cigarettes    Last attempt to quit: 01/01/1966    Years since quitting: 51.1  . Smokeless tobacco: Never Used  Substance and Sexual Activity  . Alcohol use: No    Alcohol/week: 0.0 oz  . Drug use: No  . Sexual activity: Not on file  Other Topics Concern  . Not on file  Social History Narrative   Was in service during [place].  Lives alone in a one story home.  On disability.         Current Outpatient Medications  on File Prior to Visit  Medication Sig Dispense Refill  . acetaminophen (TYLENOL) 500 MG tablet Take 1,000-1,500 mg 2 (two) times daily as needed by mouth for moderate pain.    . Ascorbic Acid (VITAMIN C) 500 MG tablet Take 500 mg by mouth 2 (two) times daily.     Marland Kitchen aspirin (GOODSENSE ASPIRIN) 325 MG tablet Take 325 mg daily by mouth.     . cholecalciferol (VITAMIN D) 1000 units tablet Take 1,000 Units at bedtime by mouth.    . clindamycin (CLEOCIN) 150 MG capsule Take 1 capsule (150 mg total) by mouth 2 (two) times daily. 10 capsule 0  . Cyanocobalamin (B-12 PO) Take 1 tablet daily by mouth.    . Cyanocobalamin (VITAMIN B-12 IJ) GET A B12 INJECTION ONCE EVERY MONTH     . ferrous sulfate 325 (65 FE) MG tablet Take 325 mg 2 (two) times daily by mouth.    . gabapentin (NEURONTIN) 300 MG capsule Take 2  capsules (600 mg total) by mouth at bedtime. 180 capsule 3  . Inositol Niacinate (NIACIN FLUSH FREE) 500 MG CAPS Take 500 mg at bedtime by mouth.     Demetra Shiner Devices (SIMPLE DIAGNOSTICS LANCING DEV) MISC   1  . metFORMIN (GLUCOPHAGE-XR) 500 MG 24 hr tablet Take 500 mg by mouth 2 (two) times daily.    . montelukast (SINGULAIR) 10 MG tablet TAKE 1 TABLET BY MOUTH AT  BEDTIME 90 tablet 3  . Multiple Vitamins-Minerals (MULTIVITAMIN WITH MINERALS) tablet Take 1 tablet by mouth daily.      . mupirocin ointment (BACTROBAN) 2 % To Right leg blisters 22 g 0  . sertraline (ZOLOFT) 50 MG tablet TAKE 1 TABLET BY MOUTH  DAILY 90 tablet 3  . traMADol (ULTRAM) 50 MG tablet Take 50 mg by mouth 2 (two) times daily.    . Zinc 50 MG CAPS Take 50 mg at bedtime by mouth.      No current facility-administered medications on file prior to visit.     No Known Allergies  Family History  Problem Relation Age of Onset  . Charcot-Marie-Tooth disease Sister   . Charcot-Marie-Tooth disease Brother   . Charcot-Marie-Tooth disease Brother   . Charcot-Marie-Tooth disease Brother   . Charcot-Marie-Tooth disease Sister   . Charcot-Marie-Tooth disease Son   . Charcot-Marie-Tooth disease Other   . COPD Daughter        never smoker    BP (!) 158/62 (BP Location: Left Arm, Patient Position: Sitting, Cuff Size: Normal)   Pulse 63   Wt 194 lb 9.6 oz (88.3 kg)   SpO2 98%   BMI 27.92 kg/m   Review of Systems He has acral numbness.  He has intermitt falls.     Objective:   Physical Exam VITAL SIGNS:  See vs page GENERAL: no distress Hands: chronic flexion contractures. lower extremities: bilat leg braces. Strength is limited by pain.   Gait: steady with a walker.      Assessment & Plan:  Chronic pain syndrome, persistent.   HTN: recheck next time.   CMT Dz: we discussed fall risk, with the need to use walker, and to minimize tramadol frequency Neuropathic numbness: this may be contributing to pain.  We'll  weigh risks and benefits as best we can.   Patient Instructions  urine tests are requested for you today.  We'll let you know about the results. I have sent a prescription to your pharmacy, for the tramadol. I'll see you next time.

## 2017-02-05 NOTE — Patient Instructions (Signed)
urine tests are requested for you today.  We'll let you know about the results. I have sent a prescription to your pharmacy, for the tramadol. I'll see you next time.

## 2017-02-06 ENCOUNTER — Encounter: Payer: Self-pay | Admitting: Sports Medicine

## 2017-02-06 ENCOUNTER — Other Ambulatory Visit: Payer: Self-pay

## 2017-02-06 ENCOUNTER — Ambulatory Visit (INDEPENDENT_AMBULATORY_CARE_PROVIDER_SITE_OTHER): Payer: Medicare HMO | Admitting: Sports Medicine

## 2017-02-06 DIAGNOSIS — L97521 Non-pressure chronic ulcer of other part of left foot limited to breakdown of skin: Secondary | ICD-10-CM | POA: Diagnosis not present

## 2017-02-06 DIAGNOSIS — G600 Hereditary motor and sensory neuropathy: Secondary | ICD-10-CM

## 2017-02-06 DIAGNOSIS — Q667 Congenital pes cavus, unspecified foot: Secondary | ICD-10-CM

## 2017-02-06 DIAGNOSIS — E1140 Type 2 diabetes mellitus with diabetic neuropathy, unspecified: Secondary | ICD-10-CM

## 2017-02-06 DIAGNOSIS — R269 Unspecified abnormalities of gait and mobility: Secondary | ICD-10-CM

## 2017-02-06 DIAGNOSIS — M216X9 Other acquired deformities of unspecified foot: Secondary | ICD-10-CM

## 2017-02-06 LAB — PAIN MGMT, PROFILE 8 W/CONF, U
6 Acetylmorphine: NEGATIVE ng/mL (ref <10)
ALCOHOL METABOLITES: NEGATIVE ng/mL (ref <500)
AMPHETAMINES: NEGATIVE ng/mL (ref <500)
Benzodiazepines: NEGATIVE ng/mL (ref <100)
Buprenorphine, Urine: NEGATIVE ng/mL (ref <5)
COCAINE METABOLITE: NEGATIVE ng/mL (ref <150)
Creatinine: 65.5 mg/dL
MARIJUANA METABOLITE: NEGATIVE ng/mL (ref <20)
MDMA: NEGATIVE ng/mL (ref <500)
OPIATES: NEGATIVE ng/mL (ref <100)
OXIDANT: NEGATIVE ug/mL (ref <200)
OXYCODONE: NEGATIVE ng/mL (ref <100)
pH: 6.79 (ref 4.5–9.0)

## 2017-02-06 MED ORDER — SIMVASTATIN 80 MG PO TABS: 80.0000 mg | ORAL_TABLET | Freq: Every day | ORAL | 3 refills | Status: AC

## 2017-02-06 MED ORDER — GLUCOSE BLOOD VI STRP: ORAL_STRIP | 12 refills | Status: AC

## 2017-02-06 MED ORDER — ACCU-CHEK AVIVA DEVI: 0 refills | Status: AC

## 2017-02-06 NOTE — Patient Instructions (Signed)
Pre-Operative Instructions  Congratulations, you have decided to take an important step towards improving your quality of life.  You can be assured that the doctors and staff at [facility] will be with you every step of the way.  Here are some important things you should know:  1. Plan to be at the surgery center/hospital at least 1 (one) hour prior to your scheduled time, unless otherwise directed by the surgical center/hospital staff.  You must have a responsible adult accompany you, remain during the surgery and drive you home.  Make sure you have directions to the surgical center/hospital to ensure you arrive on time. 2. If you are having surgery at [facility] or [facility], you will need a copy of your medical history and physical form from your family physician within one month prior to the date of surgery. We will give you a form for your primary physician to complete.  3. We make every effort to accommodate the date you request for surgery.  However, there are times where surgery dates or times have to be moved.  We will contact you as soon as possible if a change in schedule is required.   4. No aspirin/ibuprofen for one week before surgery.  If you are on aspirin, any non-steroidal anti-inflammatory medications (Mobic, Aleve, Ibuprofen) should not be taken seven (7) days prior to your surgery.  You make take Tylenol for pain prior to surgery.  5. Medications - If you are taking daily heart and blood pressure medications, seizure, reflux, allergy, asthma, anxiety, pain or diabetes medications, make sure you notify the surgery center/hospital before the day of surgery so they can tell you which medications you should take or avoid the day of surgery. 6. No food or drink after midnight the night before surgery unless directed otherwise by surgical center/hospital staff. 7. No alcoholic beverages 24-hours prior to surgery.  No smoking 24-hours prior or 24-hours after  surgery. 8. Wear loose pants or shorts. They should be loose enough to fit over bandages, boots, and casts. 9. Don't wear slip-on shoes. Sneakers are preferred. 10. Bring your boot with you to the surgery center/hospital.  Also bring crutches or a walker if your physician has prescribed it for you.  If you do not have this equipment, it will be provided for you after surgery. 11. If you have not been contacted by the surgery center/hospital by the day before your surgery, call to confirm the date and time of your surgery. 12. Leave-time from work may vary depending on the type of surgery you have.  Appropriate arrangements should be made prior to surgery with your employer. 13. Prescriptions will be provided immediately following surgery by your doctor.  Fill these as soon as possible after surgery and take the medication as directed. Pain medications will not be refilled on weekends and must be approved by the doctor. 14. Remove nail polish on the operative foot and avoid getting pedicures prior to surgery. 15. Wash the night before surgery.  The night before surgery wash the foot and leg well with water and the antibacterial soap provided. Be sure to pay special attention to beneath the toenails and in between the toes.  Wash for at least three (3) minutes. Rinse thoroughly with water and dry well with a towel.  Perform this wash unless told not to do so by your physician.  Enclosed: 1 Ice pack (please put in freezer the night before surgery)   1 Hibiclens skin cleaner     Pre-op instructions  If you have any questions regarding the instructions, please do not hesitate to call our office.  [place]: [address], [place], [place] [address] -- [phone]  [place]: [address] [place], [place] [address] -- [phone]  [place]: [address]  [place], [place] [address] -- [phone]  [place]: [address], [address], [place], [place] [address] -- [phone]  Website:  [email or web address] 

## 2017-02-06 NOTE — Progress Notes (Signed)
Patient ID: [name], male   DOB: Apr 29, 1931, 84 y.o.   MRN: [id number]  Subjective: [name] is a 84 y.o. male patient seen in office for follow up evaluation of left foot, sub-met 1 ulcer.  Patient reports that he has had burning pains to the area. Denies chest pain, nausea, vomiting, fever, chills, night sweats. Patient has no other pedal complaints at this time.  Patient is assisted by his daughter this visit reports that nursing has been coming 2x a week to do the same using the iodosorb and she has been doing it on Saturday; reports that the wound looks bigger and has been draining more since the weekend.  Patient Active Problem List   Diagnosis Date Noted  . Chronic pain 02/05/2017  . Acute diastolic CHF (congestive heart failure) ([place]) 12/04/2016  . S/P shoulder replacement, left 11/16/2016  . Preoperative clearance 10/15/2016  . Hyperlipidemia 10/15/2016  . Diabetes ([place]) 03/10/2015  . Perennial and seasonal allergic rhinitis 12/20/2014  . Hematuria 12/20/2014  . Loss of weight 05/06/2014  . Other pancytopenia ([place]) 05/05/2014  . CAP (community acquired pneumonia) 05/05/2014  . Hypocalcemia 05/05/2014  . Skin lesion 04/30/2014  . 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 Abdominal pain, right upper quadrant 05/04/2013  . Cold intolerance 05/04/2013  . Low back pain 04/03/2012  . Encounter for long-term (current) use of other medications 12/11/2011  . Routine general medical examination at a health care facility 12/17/2010  . Screening for prostate cancer 12/12/2010  . Osteoarthrosis, unspecified whether generalized or localized, unspecified site 10/24/2010  . SKIN RASH 06/29/2009  . Iron deficiency anemia 03/10/2009  . BACK PAIN, LUMBAR 03/10/2009  . [place] DISEASE 05/03/2008  . DYSPNEA 05/03/2008  . DEPRESSIVE DISORDER NOT ELSEWHERE CLASSIFIED 05/22/2007  . CHEST PAIN 05/22/2007  . ANEMIA, PERNICIOUS 12/02/2006  . Essential hypertension  07/26/2006  . Coronary atherosclerosis 07/26/2006  . GERD 07/26/2006   Current Outpatient Medications on File Prior to Visit  Medication Sig Dispense Refill  . ACCU-CHEK SOFTCLIX LANCETS lancets Used to check blood sugars daily. 100 each 12  . acetaminophen (TYLENOL) 500 MG tablet Take 1,000-1,500 mg 2 (two) times daily as needed by mouth for moderate pain.    . Alcohol Swabs (B-D SINGLE USE SWABS REGULAR) PADS Used to check blood sugars daily. 100 each 11  . Ascorbic Acid (VITAMIN C) 500 MG tablet Take 500 mg by mouth 2 (two) times daily.     Marland Kitchen aspirin (GOODSENSE ASPIRIN) 325 MG tablet Take 325 mg daily by mouth.     . Blood Glucose Calibration (ACCU-CHEK AVIVA) SOLN 1 Bottle by In Vitro route as needed. 1 each 0  . cholecalciferol (VITAMIN D) 1000 units tablet Take 1,000 Units at bedtime by mouth.    . clindamycin (CLEOCIN) 150 MG capsule Take 1 capsule (150 mg total) by mouth 2 (two) times daily. 10 capsule 0  . Cyanocobalamin (B-12 PO) Take 1 tablet daily by mouth.    . Cyanocobalamin (VITAMIN B-12 IJ) GET A B12 INJECTION ONCE EVERY MONTH     . ferrous sulfate 325 (65 FE) MG tablet Take 325 mg 2 (two) times daily by mouth.    . gabapentin (NEURONTIN) 300 MG capsule Take 2 capsules (600 mg total) by mouth at bedtime. 180 capsule 3  . Inositol Niacinate (NIACIN FLUSH FREE) 500 MG CAPS Take 500 mg at bedtime by mouth.     Elmore Guise Devices (SIMPLE DIAGNOSTICS LANCING DEV) MISC   1  .  metFORMIN (GLUCOPHAGE-XR) 500 MG 24 hr tablet Take 500 mg by mouth 2 (two) times daily.    . montelukast (SINGULAIR) 10 MG tablet TAKE 1 TABLET BY MOUTH AT  BEDTIME 90 tablet 3  . Multiple Vitamins-Minerals (MULTIVITAMIN WITH MINERALS) tablet Take 1 tablet by mouth daily.      . mupirocin ointment (BACTROBAN) 2 % To Right leg blisters 22 g 0  . sertraline (ZOLOFT) 50 MG tablet TAKE 1 TABLET BY MOUTH  DAILY 90 tablet 3  . traMADol (ULTRAM) 50 MG tablet Take 50 mg by mouth 2 (two) times daily.    . traMADol  (ULTRAM) 50 MG tablet Take 1 tablet (50 mg total) by mouth every 12 (twelve) hours as needed. 60 tablet 5  . Zinc 50 MG CAPS Take 50 mg at bedtime by mouth.      No current facility-administered medications on file prior to visit.    No Known Allergies  Recent Results (from the past 2160 hour(s))  Glucose, capillary     Status: Abnormal   Collection Time: 11/14/16  2:09 PM  Result Value Ref Range   Glucose-Capillary 112 (H) 65 - 99 mg/dL  CBC     Status: Abnormal   Collection Time: 11/14/16  2:58 PM  Result Value Ref Range   WBC 4.4 4.0 - 10.5 K/uL   RBC 3.97 (L) 4.22 - 5.81 MIL/uL   Hemoglobin 11.7 (L) 13.0 - 17.0 g/dL   HCT 36.4 (L) 39.0 - 52.0 %   MCV 91.7 78.0 - 100.0 fL   MCH 29.5 26.0 - 34.0 pg   MCHC 32.1 30.0 - 36.0 g/dL   RDW 13.9 11.5 - 15.5 %   Platelets 173 150 - 400 K/uL  Basic metabolic panel     Status: Abnormal   Collection Time: 11/14/16  2:58 PM  Result Value Ref Range   Sodium 135 135 - 145 mmol/L   Potassium 4.7 3.5 - 5.1 mmol/L   Chloride 102 101 - 111 mmol/L   CO2 26 22 - 32 mmol/L   Glucose, Bld 103 (H) 65 - 99 mg/dL   BUN 21 (H) 6 - 20 mg/dL   Creatinine, Ser 0.72 0.61 - 1.24 mg/dL   Calcium 9.2 8.9 - 10.3 mg/dL   GFR calc non Af Amer >60 >60 mL/min   GFR calc Af Amer >60 >60 mL/min    Comment: (NOTE) The eGFR has been calculated using the CKD EPI equation. This calculation has not been validated in all clinical situations. eGFR's persistently <60 mL/min signify possible Chronic Kidney Disease.    Anion gap 7 5 - 15  Surgical pcr screen     Status: None   Collection Time: 11/14/16  2:58 PM  Result Value Ref Range   MRSA, PCR NEGATIVE NEGATIVE   Staphylococcus aureus NEGATIVE NEGATIVE    Comment: (NOTE) The Xpert SA Assay (FDA approved for NASAL specimens in patients 12 years of age and older), is one component of a comprehensive surveillance program. It is not intended to diagnose infection nor to guide or monitor treatment.   Hemoglobin  A1c     Status: Abnormal   Collection Time: 11/14/16  2:59 PM  Result Value Ref Range   Hgb A1c MFr Bld 6.5 (H) 4.8 - 5.6 %    Comment: (NOTE) Pre diabetes:          5.7%-6.4% Diabetes:              >6.4% Glycemic control for   <  7.0% adults with diabetes    Mean Plasma Glucose 139.85 mg/dL  Glucose, capillary     Status: None   Collection Time: 11/16/16  1:13 PM  Result Value Ref Range   Glucose-Capillary 94 65 - 99 mg/dL  Glucose, capillary     Status: None   Collection Time: 11/16/16  4:53 PM  Result Value Ref Range   Glucose-Capillary 98 65 - 99 mg/dL   Comment 1 Notify RN    Comment 2 Document in Chart   Glucose, capillary     Status: Abnormal   Collection Time: 11/16/16  8:59 PM  Result Value Ref Range   Glucose-Capillary 135 (H) 65 - 99 mg/dL   Comment 1 Notify RN    Comment 2 Document in Chart   Hemoglobin and hematocrit, blood     Status: Abnormal   Collection Time: 11/17/16  6:04 AM  Result Value Ref Range   Hemoglobin 10.9 (L) 13.0 - 17.0 g/dL   HCT 33.8 (L) 39.0 - 52.0 %  Glucose, capillary     Status: Abnormal   Collection Time: 11/17/16  7:58 AM  Result Value Ref Range   Glucose-Capillary 135 (H) 65 - 99 mg/dL  Basic metabolic panel     Status: Abnormal   Collection Time: 12/04/16 12:20 PM  Result Value Ref Range   Glucose 102 (H) 65 - 99 mg/dL   BUN 24 8 - 27 mg/dL   Creatinine, Ser 0.61 (L) 0.76 - 1.27 mg/dL   GFR calc non Af Amer 92 >59 mL/min/1.73   GFR calc Af Amer 107 >59 mL/min/1.73   BUN/Creatinine Ratio 39 (H) 10 - 24   Sodium 141 134 - 144 mmol/L   Potassium 4.9 3.5 - 5.2 mmol/L   Chloride 105 96 - 106 mmol/L   CO2 23 20 - 29 mmol/L   Calcium 9.0 8.6 - 10.2 mg/dL  Pro b natriuretic peptide     Status: Abnormal   Collection Time: 12/04/16 12:20 PM  Result Value Ref Range   NT-Pro BNP 1,334 (H) 0 - 486 pg/mL    Comment: The following cut-points have been suggested for the use of proBNP for the diagnostic evaluation of heart failure (HF) in  patients with acute dyspnea: Modality                     Age           Optimal Cut                            (years)            Point ------------------------------------------------------ Diagnosis (rule in HF)        <50            450 pg/mL                           50 - 75            900 pg/mL                               >75           1800 pg/mL Exclusion (rule out HF)  Age independent     300 pg/mL   Basic Metabolic Panel (BMET)     Status: Abnormal   Collection Time: 12/17/16  11:56 AM  Result Value Ref Range   Glucose 109 (H) 65 - 99 mg/dL   BUN 22 8 - 27 mg/dL   Creatinine, Ser 0.80 0.76 - 1.27 mg/dL   GFR calc non Af Amer 83 >59 mL/min/1.73   GFR calc Af Amer 95 >59 mL/min/1.73   BUN/Creatinine Ratio 28 (H) 10 - 24   Sodium 140 134 - 144 mmol/L   Potassium 4.8 3.5 - 5.2 mmol/L   Chloride 102 96 - 106 mmol/L   CO2 25 20 - 29 mmol/L   Calcium 9.3 8.6 - 10.2 mg/dL  Pro b natriuretic peptide (BNP)     Status: Abnormal   Collection Time: 12/17/16 11:56 AM  Result Value Ref Range   NT-Pro BNP 888 (H) 0 - 486 pg/mL    Comment: The following cut-points have been suggested for the use of proBNP for the diagnostic evaluation of heart failure (HF) in patients with acute dyspnea: Modality                     Age           Optimal Cut                            (years)            Point ------------------------------------------------------ Diagnosis (rule in HF)        <50            450 pg/mL                           50 - 75            900 pg/mL                               >75           1800 pg/mL Exclusion (rule out HF)  Age independent     300 pg/mL     Objective: There were no vitals filed for this visit.  General: Patient is awake, alert, oriented x 3 and in no acute distress in electric wheelchair.  Dermatology: Skin is warm and dry bilateral with a full-thickness ulceration left sub-met one with  granular base with reactive keratosis that measures 1.2 x 1.2 x 0.2  cm with no other surrounding signs of infection.  1+ pitting edema bilateral. Nails x 10 mycotic but short.    Vascular: Dorsalis Pedis pulse = 1/4 Bilateral,  Posterior Tibial pulse = 0/4 Bilateral,  Capillary Fill Time < 5 seconds  Neurologic: Protective sensation absent bilateral using the 5.07/10g Semmes Weinstein Monofilament.  Musculosketal: No pain with palpation to ulcerated area. Pes cavus foot type with prominent first metatarsal head bilateral, left greater than right. Hammertoes with Muscle weakness requiring bracing bilateral from Charcot-Marie-Tooth.  Assessment and Plan:  Problem List Items Addressed This Visit      Nervous and Auditory   CHARCOT-MARIE-TOOTH DISEASE    Other Visit Diagnoses    Foot ulcer, left, limited to breakdown of skin ([place])    -  Primary   Prominent metatarsal head, unspecified laterality       and sesamoid   Type 2 diabetes, controlled, with neuropathy (HCC)       Pes cavus, congenital       Abnormality of gait         -  Examined patient and discussed the progression of Recurrent wound left sub-met one -Mechanically debrided left sub-met one ulceration with sterile chisel blade.  Upon completing debridement to healthy bleeding tissue.  Cleansed ulcertation then applied Iodosorb dressing to left covered with dry dressings.  Patient's daughter and home nursing via well care to assist patient with dressing changes consisting of the same.  -Continue with custom shoes and braces and offloading orthotics   -Continue with gabapentin for neuropathy -Patient opt for surgical management. Consent obtained for Left wound debridement with removal of bone/sesamoid and placement of stravix graft. Pre and Post op course explained. Risks, benefits, alternatives explained. No guarantees given or implied. Surgical booking slip submitted and provided patient with Surgical packet and info for [facility].  -Patient has CAM Walker to use post op and will be nonweightbearing with  use of electric wheelchair -Patient to return to office for ulcer check in 2-3 weeks or at the time of surgery whichever is sooner.   Landis Martins, DPM

## 2017-02-07 ENCOUNTER — Telehealth: Payer: Self-pay | Admitting: *Deleted

## 2017-02-07 DIAGNOSIS — Q667 Congenital pes cavus: Secondary | ICD-10-CM | POA: Diagnosis not present

## 2017-02-07 DIAGNOSIS — L97419 Non-pressure chronic ulcer of right heel and midfoot with unspecified severity: Secondary | ICD-10-CM | POA: Diagnosis not present

## 2017-02-07 DIAGNOSIS — E1151 Type 2 diabetes mellitus with diabetic peripheral angiopathy without gangrene: Secondary | ICD-10-CM | POA: Diagnosis not present

## 2017-02-07 DIAGNOSIS — E1140 Type 2 diabetes mellitus with diabetic neuropathy, unspecified: Secondary | ICD-10-CM | POA: Diagnosis not present

## 2017-02-07 DIAGNOSIS — E11621 Type 2 diabetes mellitus with foot ulcer: Secondary | ICD-10-CM | POA: Diagnosis not present

## 2017-02-07 DIAGNOSIS — Z4800 Encounter for change or removal of nonsurgical wound dressing: Secondary | ICD-10-CM | POA: Diagnosis not present

## 2017-02-07 DIAGNOSIS — I5032 Chronic diastolic (congestive) heart failure: Secondary | ICD-10-CM | POA: Diagnosis not present

## 2017-02-07 DIAGNOSIS — I110 Hypertensive heart disease with heart failure: Secondary | ICD-10-CM | POA: Diagnosis not present

## 2017-02-07 DIAGNOSIS — I2510 Atherosclerotic heart disease of native coronary artery without angina pectoris: Secondary | ICD-10-CM | POA: Diagnosis not present

## 2017-02-07 NOTE — Telephone Encounter (Signed)
"  I'm calling to schedule my surgery.  Please give me a call."  I'm returning your call.  Do you have a date in mind that you would like to have it done?  "We'd like to do it on February 18."  That date is not available, Dr. [name] will be out of the office that date.  "Can he do it next week, she said she wanted to do it as soon as possible."  What type of insurance does he have?  "He has [employer]."  We will not be able to do it on Monday because [employer] requires prior authorization.  She can do it on 02/25/2017.  "Put him down for then.  What time will he need to be there?"  Someone from [facility] will call probably the Friday before the surgery date with the arrival time.  If I have any problems getting it schedule I will let you know.  Dr. [name] does not have block time [facility] so we have to see what's available.

## 2017-02-11 DIAGNOSIS — E11621 Type 2 diabetes mellitus with foot ulcer: Secondary | ICD-10-CM | POA: Diagnosis not present

## 2017-02-11 DIAGNOSIS — E1140 Type 2 diabetes mellitus with diabetic neuropathy, unspecified: Secondary | ICD-10-CM | POA: Diagnosis not present

## 2017-02-11 DIAGNOSIS — I5032 Chronic diastolic (congestive) heart failure: Secondary | ICD-10-CM | POA: Diagnosis not present

## 2017-02-11 DIAGNOSIS — E1151 Type 2 diabetes mellitus with diabetic peripheral angiopathy without gangrene: Secondary | ICD-10-CM | POA: Diagnosis not present

## 2017-02-11 DIAGNOSIS — L97419 Non-pressure chronic ulcer of right heel and midfoot with unspecified severity: Secondary | ICD-10-CM | POA: Diagnosis not present

## 2017-02-11 DIAGNOSIS — Z4800 Encounter for change or removal of nonsurgical wound dressing: Secondary | ICD-10-CM | POA: Diagnosis not present

## 2017-02-11 DIAGNOSIS — I2510 Atherosclerotic heart disease of native coronary artery without angina pectoris: Secondary | ICD-10-CM | POA: Diagnosis not present

## 2017-02-11 DIAGNOSIS — I110 Hypertensive heart disease with heart failure: Secondary | ICD-10-CM | POA: Diagnosis not present

## 2017-02-11 DIAGNOSIS — Q667 Congenital pes cavus: Secondary | ICD-10-CM | POA: Diagnosis not present

## 2017-02-14 DIAGNOSIS — E1140 Type 2 diabetes mellitus with diabetic neuropathy, unspecified: Secondary | ICD-10-CM | POA: Diagnosis not present

## 2017-02-14 DIAGNOSIS — I5032 Chronic diastolic (congestive) heart failure: Secondary | ICD-10-CM | POA: Diagnosis not present

## 2017-02-14 DIAGNOSIS — E1151 Type 2 diabetes mellitus with diabetic peripheral angiopathy without gangrene: Secondary | ICD-10-CM | POA: Diagnosis not present

## 2017-02-14 DIAGNOSIS — E11621 Type 2 diabetes mellitus with foot ulcer: Secondary | ICD-10-CM | POA: Diagnosis not present

## 2017-02-14 DIAGNOSIS — I110 Hypertensive heart disease with heart failure: Secondary | ICD-10-CM | POA: Diagnosis not present

## 2017-02-14 DIAGNOSIS — Z4800 Encounter for change or removal of nonsurgical wound dressing: Secondary | ICD-10-CM | POA: Diagnosis not present

## 2017-02-14 DIAGNOSIS — Q667 Congenital pes cavus: Secondary | ICD-10-CM | POA: Diagnosis not present

## 2017-02-14 DIAGNOSIS — L97419 Non-pressure chronic ulcer of right heel and midfoot with unspecified severity: Secondary | ICD-10-CM | POA: Diagnosis not present

## 2017-02-14 DIAGNOSIS — I2510 Atherosclerotic heart disease of native coronary artery without angina pectoris: Secondary | ICD-10-CM | POA: Diagnosis not present

## 2017-02-18 ENCOUNTER — Telehealth: Payer: Self-pay | Admitting: Sports Medicine

## 2017-02-18 NOTE — Telephone Encounter (Signed)
Hi, this is Brandi with [facility]. I was just needing to find out if it is okay that I re-certify him and continue the wound care. I know he is scheduled to have surgery next week but I wanted to make sure Dr. [name] wanted us to continue to see him. You can reach me at [phone]. Thank you.

## 2017-02-18 NOTE — Telephone Encounter (Signed)
I told Gwenith SpitzBrandi - [facility] to continue pt's wound care until surgery.

## 2017-02-19 DIAGNOSIS — M25511 Pain in right shoulder: Secondary | ICD-10-CM | POA: Diagnosis not present

## 2017-02-19 DIAGNOSIS — M25512 Pain in left shoulder: Secondary | ICD-10-CM | POA: Diagnosis not present

## 2017-02-19 DIAGNOSIS — Z96612 Presence of left artificial shoulder joint: Secondary | ICD-10-CM | POA: Diagnosis not present

## 2017-02-20 ENCOUNTER — Other Ambulatory Visit: Payer: Self-pay

## 2017-02-20 ENCOUNTER — Encounter (HOSPITAL_BASED_OUTPATIENT_CLINIC_OR_DEPARTMENT_OTHER): Payer: Self-pay | Admitting: *Deleted

## 2017-02-20 ENCOUNTER — Telehealth: Payer: Self-pay | Admitting: *Deleted

## 2017-02-20 ENCOUNTER — Telehealth: Payer: Self-pay | Admitting: Endocrinology

## 2017-02-20 ENCOUNTER — Telehealth: Payer: Self-pay | Admitting: Cardiovascular Disease

## 2017-02-20 NOTE — Progress Notes (Signed)
Dr George HughEllison's office called concerning my request for completing H&P form for their patient [name] who is having foot surgery 02-25-17 by Dr Romeo RabonStover-podietry. The patient did see Dr Everardo AllEllison on 02-05-17 for chronic pain visit and med refill, however there was not a physical done on that visit. Dr Everardo AllEllison got on phone and stated "I will not fill out form for podietry to perform surgery", stated he had an emergency in his office and needed to go and hung up. I called and spoke with Delydia at Dr Wynema BirchStover's office to let her know that pt does not have H&P. She states she will talk to Dr [name] for guidance and call me back.

## 2017-02-20 NOTE — Telephone Encounter (Signed)
"  I calling in regards to [name].  He's scheduled for surgery on Monday with Dr. [name].  We have not received his history and physical form.  We called his daughter and she said they had given the forms to Dr. Everardo AllEllison.  I called Dr. George HughEllison's office to see if he had filled the form out.  Dr. Everardo AllEllison got on the phone and asked why does he have to fill out a form.  He said the treating physician should fill out the form.  I explained to him that your physicians are Podiatrist not medical doctors and that this is a [facility] policy to get the the primary care doctor to fill it out.  He told me that he declines, he's not filling out the form.  I don't know what to do."  I will call Dr. George HughEllison's office and see what I can find out.  I will let Dr. [name] know as well.  Thanks so much for trying to obtain the history and physical form.  I'll let you know what I find out.  I called Dr. George HughEllison's office and left a message for his nurse to call me back.

## 2017-02-20 NOTE — Telephone Encounter (Signed)
I spoke with Tammy as well as Dr. Everardo AllEllison. This form was declined to be filled out because all needed information is in Epic.

## 2017-02-20 NOTE — Telephone Encounter (Signed)
If the Dr. Everardo AllEllison continues to decline to fill out H&P we might can ask OR if they can get one of the hospital doctors (hospitalist) on the day of surgery to see patient bedside to clear him for surgery or possibly move the case to [facility]. Thanks Dr. SKathie Rhodes

## 2017-02-20 NOTE — Telephone Encounter (Signed)
   Primary Cardiologist: Verne Carrowhristopher McAlhany, MD  Chart reviewed as part of pre-operative protocol coverage. Given past medical history and time since last visit, based on ACC/AHA guidelines, [name] would be at acceptable risk for the planned procedure without further cardiovascular testing to have diabetic foot ulcer surgery.   I will route this recommendation to the requesting party via Epic fax function and remove from pre-op pool.   I will route this to Dr. Clifton JamesMcAlhany to notify him as well, at your request.   Please call with questions.  David ReiningKathryn Brayton Baumgartner DNP, ANP, AACC  02/20/2017, 3:42 PM

## 2017-02-20 NOTE — Progress Notes (Signed)
Chart reviewed by Dr Maple HudsonMoser with anesthesia, states that patient will need cardiac clearance stating that they are aware that pt is having foot surgery and it is OK to proceed without further testing. Dr Sanjuana KavaMcAlhaney office notified of need for clearance.

## 2017-02-20 NOTE — Telephone Encounter (Signed)
[name] with [facility] called re: Please fax patient's H and P form to [name] at fax# [phone] (Patient's daughter dropped off the form almost 2 weeks ago with Dr George HughEllison's Nurse) They must have form by Friday 02/22/17-patient is having diabetic foot surgery on Monday 02/25/17 If questions call [name] at ph# [phone]

## 2017-02-20 NOTE — Telephone Encounter (Signed)
° °   [facility] Pre-operative Risk Assessment    Request for surgical clearance:  1. What type of surgery is being performed? Foot surgery// Diabetic Ulcer// 1 hour long surgery   2. When is this surgery scheduled? 02-25-17  3. What type of clearance is required (medical clearance vs. Pharmacy clearance to hold med vs. Both)? Medical Clearance  4. Are there any medications that need to be held prior to surgery and how long? No. Would like to see if patient needs further cardiac clearance and wanted to make sure that Dr. Angelena Form is aware.  5. Practice name and name of physician performing surgery? [facility]// Dr. Cannon Kettle  6. What is your office phone and fax number? Phone # [phone] Fax # [phone] "Attn Tammy"  7. Anesthesia type (None, local, MAC, general) ? MAC    Marjean Donna 02/20/2017, 8:35 AM  _________________________________________________________________   (provider comments below)

## 2017-02-20 NOTE — Telephone Encounter (Signed)
[name] with [facility] calling about status of H and P form that was brought into office for the dr.   Please advise  [name]  [phone]

## 2017-02-20 NOTE — Telephone Encounter (Signed)
Faxed via EPIC to requesting party 

## 2017-02-21 DIAGNOSIS — E11621 Type 2 diabetes mellitus with foot ulcer: Secondary | ICD-10-CM | POA: Diagnosis not present

## 2017-02-21 DIAGNOSIS — I2510 Atherosclerotic heart disease of native coronary artery without angina pectoris: Secondary | ICD-10-CM | POA: Diagnosis not present

## 2017-02-21 DIAGNOSIS — L97419 Non-pressure chronic ulcer of right heel and midfoot with unspecified severity: Secondary | ICD-10-CM | POA: Diagnosis not present

## 2017-02-21 DIAGNOSIS — E1140 Type 2 diabetes mellitus with diabetic neuropathy, unspecified: Secondary | ICD-10-CM | POA: Diagnosis not present

## 2017-02-21 DIAGNOSIS — E1151 Type 2 diabetes mellitus with diabetic peripheral angiopathy without gangrene: Secondary | ICD-10-CM | POA: Diagnosis not present

## 2017-02-21 DIAGNOSIS — Q667 Congenital pes cavus: Secondary | ICD-10-CM | POA: Diagnosis not present

## 2017-02-21 DIAGNOSIS — I110 Hypertensive heart disease with heart failure: Secondary | ICD-10-CM | POA: Diagnosis not present

## 2017-02-21 DIAGNOSIS — Z4800 Encounter for change or removal of nonsurgical wound dressing: Secondary | ICD-10-CM | POA: Diagnosis not present

## 2017-02-21 DIAGNOSIS — I5032 Chronic diastolic (congestive) heart failure: Secondary | ICD-10-CM | POA: Diagnosis not present

## 2017-02-21 NOTE — Telephone Encounter (Addendum)
I am calling in regards to your dad's surgery that's scheduled for Monday.  Dr. Everardo AllEllison would not complete the history and physical form.  Your dad may need to try and get a physical by someone else.  Why wouldn't Dr. Everardo AllEllison do it?  He said the surgeon needs to fill it out himself.  The nurse from the surgical center told him that our doctors were Podiatrists not Medical Doctors but he said he would not do it.  "Why is he such a butt hole?"  I don't have a response for that question.  "I will try and see what I can figure out tomorrow.  I guess I need to go by your office and get another form.  What's your fax number?"  My fax number is [phone] and my phone number is [phone].  "I'll get on it first thing in the morning."

## 2017-02-22 ENCOUNTER — Telehealth: Payer: Self-pay | Admitting: *Deleted

## 2017-02-22 NOTE — Telephone Encounter (Signed)
"  I'm calling about my dad.  Calling to see if you got those papers.  Give me a call."

## 2017-02-22 NOTE — Telephone Encounter (Signed)
I am calling to let you know we have not received the history and physical form from Dr. Clifton JamesMcAlhany.  We are going to probably have to cancel his surgery for Monday.  "What is going on?"  Why will no one sign the forms?  My dad needs this surgery."  Unfortunately Podiatrist cannot do physicals, only Medical Doctors can.  "What is Dr. [name] saying we should do?"  I haven't talked to her yet.  I will call and inform her.  I'll let you know if I hear anything different.

## 2017-02-22 NOTE — Progress Notes (Signed)
No history and physical  Received for surgery Monday.

## 2017-02-22 NOTE — Telephone Encounter (Signed)
I spoke with Dr. Clifton JamesMcAlhany and he has not spoken to anyone about completing History and Physical.  Dr. Clifton JamesMcAlhany is not willing to update History and Physical on this pt as he is not involved in planned surgery.

## 2017-02-22 NOTE — Telephone Encounter (Signed)
David HymenBonnie called from our [place] office and stated [name] called and stated Dr. Sanjuana KavaMcAlhaney said he would complete the history and physical form.  She asked us to fax the forms to him.  The history and physical form was sent to Dr. Verne Carrowhristopher McAlhany.

## 2017-02-22 NOTE — Telephone Encounter (Signed)
Received fax today from [facility] for surgery clearance to be done on Monday 02/25/17. What was faxed over though was a history and physical form looks like they are asking for the provider to fill out. I will bring this to Pre-Op provider for today for further advice.

## 2017-02-25 ENCOUNTER — Encounter (HOSPITAL_BASED_OUTPATIENT_CLINIC_OR_DEPARTMENT_OTHER): Payer: Self-pay | Admitting: Anesthesiology

## 2017-02-25 ENCOUNTER — Telehealth: Payer: Self-pay | Admitting: Endocrinology

## 2017-02-25 ENCOUNTER — Ambulatory Visit (HOSPITAL_BASED_OUTPATIENT_CLINIC_OR_DEPARTMENT_OTHER): Admission: RE | Admit: 2017-02-25 | Payer: Medicare HMO | Source: Ambulatory Visit | Admitting: Sports Medicine

## 2017-02-25 DIAGNOSIS — I2510 Atherosclerotic heart disease of native coronary artery without angina pectoris: Secondary | ICD-10-CM | POA: Diagnosis not present

## 2017-02-25 DIAGNOSIS — I5032 Chronic diastolic (congestive) heart failure: Secondary | ICD-10-CM | POA: Diagnosis not present

## 2017-02-25 DIAGNOSIS — E11621 Type 2 diabetes mellitus with foot ulcer: Secondary | ICD-10-CM | POA: Diagnosis not present

## 2017-02-25 DIAGNOSIS — I110 Hypertensive heart disease with heart failure: Secondary | ICD-10-CM | POA: Diagnosis not present

## 2017-02-25 DIAGNOSIS — E1140 Type 2 diabetes mellitus with diabetic neuropathy, unspecified: Secondary | ICD-10-CM | POA: Diagnosis not present

## 2017-02-25 DIAGNOSIS — E1151 Type 2 diabetes mellitus with diabetic peripheral angiopathy without gangrene: Secondary | ICD-10-CM | POA: Diagnosis not present

## 2017-02-25 DIAGNOSIS — L97419 Non-pressure chronic ulcer of right heel and midfoot with unspecified severity: Secondary | ICD-10-CM | POA: Diagnosis not present

## 2017-02-25 DIAGNOSIS — Q667 Congenital pes cavus: Secondary | ICD-10-CM | POA: Diagnosis not present

## 2017-02-25 DIAGNOSIS — Z4800 Encounter for change or removal of nonsurgical wound dressing: Secondary | ICD-10-CM | POA: Diagnosis not present

## 2017-02-25 SURGERY — EXCISION, SESAMOID BONE
Anesthesia: Monitor Anesthesia Care | Laterality: Left

## 2017-02-25 NOTE — Telephone Encounter (Signed)
Patient asked you to give him a call when you get a chance. Did not want to give me any info

## 2017-02-25 NOTE — Telephone Encounter (Signed)
I called [name] and informed her we had to cancel her dad's surgery that was scheduled for today because we did not have a completed history and physical form completed by his doctor.  She stated she was going to see if she could get him an appointment with his doctor so they can get the form completed.  I asked her to let me know once the appointment is made then we can try and get him scheduled for surgery again.

## 2017-02-25 NOTE — Progress Notes (Signed)
  Subjective:  Patient ID: [name], male    DOB: Jul 05, 1931,  MRN: [id number]  Chief Complaint  Patient presents with  . Foot Ulcer    ulcer left 1st sub met has been bleeding since last Thursday noticed an odor today lunch    84 y.o. male returns for wound care. Believes the wound to be worsening. States the area has been bleeding. Has noticed and odor. Denies N/V/F/Ch.  Objective:  There were no vitals filed for this visit. General AA&O x3. Normal mood and affect.  Vascular Foot warm to touch.  Neurologic Sensation grossly diminished.  Dermatologic (Wound) Wound Location: L 1st MPJ Wound Measurement: 1.5x1.5 Wound Base: Granular/Healthy Peri-wound: Calloused, erythematous Exudate: None: wound tissue dry   Orthopedic: No pain to palpation either foot.   Assessment & Plan:  Patient was evaluated and treated and all questions answered.  Ulcer L 1st MPJ -Debridement as below. -Dressed with medihoney, DSD. -Rx Clinda/Cipro

## 2017-02-25 NOTE — Telephone Encounter (Signed)
Pt would like to know if he can have the freestyle libre 14 day sent in for him because he can not stick his fingers "they are to bruised." He also would like an explantation on why his form for his "foot procedure" was not completed by Dr. Everardo AllEllison because he can not have his procedure until this is done.  Please advise

## 2017-02-25 NOTE — Telephone Encounter (Signed)
To qualify for freestyle libre, you have to have type 1 DM.  For the hospital, I don't have priviliges, so the podiatrist will arrange for this.

## 2017-02-26 DIAGNOSIS — L97419 Non-pressure chronic ulcer of right heel and midfoot with unspecified severity: Secondary | ICD-10-CM | POA: Diagnosis not present

## 2017-02-26 DIAGNOSIS — I110 Hypertensive heart disease with heart failure: Secondary | ICD-10-CM | POA: Diagnosis not present

## 2017-02-26 DIAGNOSIS — E11621 Type 2 diabetes mellitus with foot ulcer: Secondary | ICD-10-CM | POA: Diagnosis not present

## 2017-02-26 DIAGNOSIS — I5032 Chronic diastolic (congestive) heart failure: Secondary | ICD-10-CM | POA: Diagnosis not present

## 2017-02-26 DIAGNOSIS — Q667 Congenital pes cavus: Secondary | ICD-10-CM | POA: Diagnosis not present

## 2017-02-26 DIAGNOSIS — Z4800 Encounter for change or removal of nonsurgical wound dressing: Secondary | ICD-10-CM | POA: Diagnosis not present

## 2017-02-26 DIAGNOSIS — I2510 Atherosclerotic heart disease of native coronary artery without angina pectoris: Secondary | ICD-10-CM | POA: Diagnosis not present

## 2017-02-26 DIAGNOSIS — E1140 Type 2 diabetes mellitus with diabetic neuropathy, unspecified: Secondary | ICD-10-CM | POA: Diagnosis not present

## 2017-02-26 DIAGNOSIS — E1151 Type 2 diabetes mellitus with diabetic peripheral angiopathy without gangrene: Secondary | ICD-10-CM | POA: Diagnosis not present

## 2017-02-27 DIAGNOSIS — L97419 Non-pressure chronic ulcer of right heel and midfoot with unspecified severity: Secondary | ICD-10-CM | POA: Diagnosis not present

## 2017-02-27 DIAGNOSIS — E11621 Type 2 diabetes mellitus with foot ulcer: Secondary | ICD-10-CM | POA: Diagnosis not present

## 2017-02-27 DIAGNOSIS — E1151 Type 2 diabetes mellitus with diabetic peripheral angiopathy without gangrene: Secondary | ICD-10-CM | POA: Diagnosis not present

## 2017-02-27 DIAGNOSIS — I5032 Chronic diastolic (congestive) heart failure: Secondary | ICD-10-CM | POA: Diagnosis not present

## 2017-02-27 DIAGNOSIS — I2510 Atherosclerotic heart disease of native coronary artery without angina pectoris: Secondary | ICD-10-CM | POA: Diagnosis not present

## 2017-02-27 DIAGNOSIS — E1140 Type 2 diabetes mellitus with diabetic neuropathy, unspecified: Secondary | ICD-10-CM | POA: Diagnosis not present

## 2017-02-27 DIAGNOSIS — Q667 Congenital pes cavus: Secondary | ICD-10-CM | POA: Diagnosis not present

## 2017-02-27 DIAGNOSIS — Z4800 Encounter for change or removal of nonsurgical wound dressing: Secondary | ICD-10-CM | POA: Diagnosis not present

## 2017-02-27 DIAGNOSIS — I110 Hypertensive heart disease with heart failure: Secondary | ICD-10-CM | POA: Diagnosis not present

## 2017-02-27 NOTE — Telephone Encounter (Signed)
Pt stated a form was sent to Dr. Everardo AllEllison to sign for the clearence for the surgery and he was told that Dr. Everardo AllEllison refused to sign this so his doctor was unable to do the surgery. Please advise on this surgery clearance form

## 2017-02-27 NOTE — Telephone Encounter (Signed)
We can only do this as part of your annual checkup.  It is due 03/05/17.  Please make an appt for any day after that.

## 2017-02-28 DIAGNOSIS — I5032 Chronic diastolic (congestive) heart failure: Secondary | ICD-10-CM | POA: Diagnosis not present

## 2017-02-28 DIAGNOSIS — Z4800 Encounter for change or removal of nonsurgical wound dressing: Secondary | ICD-10-CM | POA: Diagnosis not present

## 2017-02-28 DIAGNOSIS — E1151 Type 2 diabetes mellitus with diabetic peripheral angiopathy without gangrene: Secondary | ICD-10-CM | POA: Diagnosis not present

## 2017-02-28 DIAGNOSIS — E11621 Type 2 diabetes mellitus with foot ulcer: Secondary | ICD-10-CM | POA: Diagnosis not present

## 2017-02-28 DIAGNOSIS — L97419 Non-pressure chronic ulcer of right heel and midfoot with unspecified severity: Secondary | ICD-10-CM | POA: Diagnosis not present

## 2017-02-28 DIAGNOSIS — E1140 Type 2 diabetes mellitus with diabetic neuropathy, unspecified: Secondary | ICD-10-CM | POA: Diagnosis not present

## 2017-02-28 DIAGNOSIS — I110 Hypertensive heart disease with heart failure: Secondary | ICD-10-CM | POA: Diagnosis not present

## 2017-02-28 DIAGNOSIS — Q667 Congenital pes cavus: Secondary | ICD-10-CM | POA: Diagnosis not present

## 2017-02-28 DIAGNOSIS — I2510 Atherosclerotic heart disease of native coronary artery without angina pectoris: Secondary | ICD-10-CM | POA: Diagnosis not present

## 2017-02-28 NOTE — Telephone Encounter (Signed)
Patient is already scheduled for 03/05/17 and he will bring the form- he states he has not had a CPE in over a year please advise if I need to reschedule this appointment for him

## 2017-03-01 ENCOUNTER — Telehealth: Payer: Self-pay | Admitting: *Deleted

## 2017-03-01 NOTE — Telephone Encounter (Signed)
"  I'm calling about my father, [name].  I'm wondering if you got him on the schedule for surgery and what day that might be on."   I left her a message that I have Mr. [name] scheduled for surgery on March 18 at 1 pm [facility].  I asked her to call me if she has any further questions.

## 2017-03-04 ENCOUNTER — Telehealth: Payer: Self-pay | Admitting: *Deleted

## 2017-03-04 NOTE — Telephone Encounter (Signed)
-----   Message from Asencion Islamitorya Stover, [place] sent at 03/01/2017 10:05 AM EST ----- Regarding: IVR Epifix and Epicord for OR  Talked with Tawanna Coolerodd about switching patient from Grafix osiris to Eipifix can you please check insurance to see if cost effective -Dr. [name]

## 2017-03-04 NOTE — Telephone Encounter (Signed)
Faxed Patient Insurance Verification Request to [employer] form, clinicals and demographics to [place].

## 2017-03-05 ENCOUNTER — Ambulatory Visit
Admission: RE | Admit: 2017-03-05 | Discharge: 2017-03-05 | Disposition: A | Discharge status: Home / Self Care | Payer: Medicare HMO | Source: Ambulatory Visit | Attending: Endocrinology | Admitting: Endocrinology

## 2017-03-05 ENCOUNTER — Telehealth: Payer: Self-pay | Admitting: *Deleted

## 2017-03-05 ENCOUNTER — Ambulatory Visit (INDEPENDENT_AMBULATORY_CARE_PROVIDER_SITE_OTHER): Payer: Medicare HMO | Admitting: Endocrinology

## 2017-03-05 VITALS — BP 132/70 | HR 63 | Wt 188.0 lb

## 2017-03-05 DIAGNOSIS — E1159 Type 2 diabetes mellitus with other circulatory complications: Secondary | ICD-10-CM

## 2017-03-05 DIAGNOSIS — R0689 Other abnormalities of breathing: Secondary | ICD-10-CM

## 2017-03-05 DIAGNOSIS — E1140 Type 2 diabetes mellitus with diabetic neuropathy, unspecified: Secondary | ICD-10-CM | POA: Diagnosis not present

## 2017-03-05 DIAGNOSIS — E11649 Type 2 diabetes mellitus with hypoglycemia without coma: Secondary | ICD-10-CM

## 2017-03-05 DIAGNOSIS — Z4800 Encounter for change or removal of nonsurgical wound dressing: Secondary | ICD-10-CM | POA: Diagnosis not present

## 2017-03-05 DIAGNOSIS — D509 Iron deficiency anemia, unspecified: Secondary | ICD-10-CM | POA: Diagnosis not present

## 2017-03-05 DIAGNOSIS — Q667 Congenital pes cavus: Secondary | ICD-10-CM | POA: Diagnosis not present

## 2017-03-05 DIAGNOSIS — G600 Hereditary motor and sensory neuropathy: Secondary | ICD-10-CM | POA: Diagnosis not present

## 2017-03-05 DIAGNOSIS — Z125 Encounter for screening for malignant neoplasm of prostate: Secondary | ICD-10-CM | POA: Diagnosis not present

## 2017-03-05 DIAGNOSIS — I110 Hypertensive heart disease with heart failure: Secondary | ICD-10-CM | POA: Diagnosis not present

## 2017-03-05 DIAGNOSIS — I5032 Chronic diastolic (congestive) heart failure: Secondary | ICD-10-CM | POA: Diagnosis not present

## 2017-03-05 DIAGNOSIS — I2510 Atherosclerotic heart disease of native coronary artery without angina pectoris: Secondary | ICD-10-CM | POA: Diagnosis not present

## 2017-03-05 DIAGNOSIS — E1151 Type 2 diabetes mellitus with diabetic peripheral angiopathy without gangrene: Secondary | ICD-10-CM | POA: Diagnosis not present

## 2017-03-05 DIAGNOSIS — J9610 Chronic respiratory failure, unspecified whether with hypoxia or hypercapnia: Secondary | ICD-10-CM | POA: Diagnosis not present

## 2017-03-05 DIAGNOSIS — R918 Other nonspecific abnormal finding of lung field: Secondary | ICD-10-CM | POA: Diagnosis not present

## 2017-03-05 DIAGNOSIS — E11621 Type 2 diabetes mellitus with foot ulcer: Secondary | ICD-10-CM | POA: Diagnosis not present

## 2017-03-05 DIAGNOSIS — L97419 Non-pressure chronic ulcer of right heel and midfoot with unspecified severity: Secondary | ICD-10-CM | POA: Diagnosis not present

## 2017-03-05 LAB — PSA: PSA: 2.59 ng/mL (ref 0.10–4.00)

## 2017-03-05 LAB — BASIC METABOLIC PANEL
BUN: 26 mg/dL — AB (ref 6–23)
CHLORIDE: 103 meq/L (ref 96–112)
CO2: 29 meq/L (ref 19–32)
CREATININE: 0.77 mg/dL (ref 0.40–1.50)
Calcium: 9.3 mg/dL (ref 8.4–10.5)
GFR: 102.48 mL/min (ref >60.00)
Glucose, Bld: 89 mg/dL (ref 70–99)
Potassium: 4.9 mEq/L (ref 3.5–5.1)
Sodium: 138 mEq/L (ref 135–145)

## 2017-03-05 LAB — HEPATIC FUNCTION PANEL
ALK PHOS: 59 U/L (ref 39–117)
ALT: 15 U/L (ref 0–53)
AST: 19 U/L (ref 0–37)
Albumin: 4.1 g/dL (ref 3.5–5.2)
BILIRUBIN DIRECT: 0.1 mg/dL (ref 0.0–0.3)
Total Bilirubin: 0.4 mg/dL (ref 0.2–1.2)
Total Protein: 6.4 g/dL (ref 6.0–8.3)

## 2017-03-05 LAB — CBC WITH DIFFERENTIAL/PLATELET
Basophils Absolute: 0 10*3/uL (ref 0.0–0.1)
Basophils Relative: 0.6 % (ref 0.0–3.0)
EOS PCT: 2.1 % (ref 0.0–5.0)
Eosinophils Absolute: 0.1 10*3/uL (ref 0.0–0.7)
HCT: 36 % — ABNORMAL LOW (ref 39.0–52.0)
HEMOGLOBIN: 11.8 g/dL — AB (ref 13.0–17.0)
LYMPHS ABS: 1.2 10*3/uL (ref 0.7–4.0)
Lymphocytes Relative: 18.6 % (ref 12.0–46.0)
MCHC: 32.7 g/dL (ref 30.0–36.0)
MCV: 88.5 fl (ref 78.0–100.0)
MONO ABS: 0.7 10*3/uL (ref 0.1–1.0)
Monocytes Relative: 10.7 % (ref 3.0–12.0)
Neutro Abs: 4.5 10*3/uL (ref 1.4–7.7)
Neutrophils Relative %: 68 % (ref 43.0–77.0)
Platelets: 182 10*3/uL (ref 150.0–400.0)
RBC: 4.07 Mil/uL — ABNORMAL LOW (ref 4.22–5.81)
RDW: 14.4 % (ref 11.5–15.5)
WBC: 6.6 10*3/uL (ref 4.0–10.5)

## 2017-03-05 LAB — IBC PANEL
Iron: 64 ug/dL (ref 42–165)
Saturation Ratios: 19.4 % — ABNORMAL LOW (ref 20.0–50.0)
Transferrin: 236 mg/dL (ref 212.0–360.0)

## 2017-03-05 LAB — TSH: TSH: 2.29 u[IU]/mL (ref 0.35–4.50)

## 2017-03-05 LAB — LIPID PANEL
Cholesterol: 150 mg/dL (ref 0–200)
HDL: 54.2 mg/dL (ref >39.00)
LDL CALC: 74 mg/dL (ref 0–99)
NonHDL: 95.95
Total CHOL/HDL Ratio: 3
Triglycerides: 109 mg/dL (ref 0.0–149.0)
VLDL: 21.8 mg/dL (ref 0.0–40.0)

## 2017-03-05 LAB — POCT GLYCOSYLATED HEMOGLOBIN (HGB A1C): HEMOGLOBIN A1C: 6.4

## 2017-03-05 NOTE — Telephone Encounter (Signed)
"  My dad just saw Dr. Everardo AllEllison.  He said he would fill out the paperwork for the surgery.  I tried to give him the paperwork and he snapped and said he already has it.  Is this so?"  I sent the form over previously.  "Well can you let me know if you don't have it by Friday?"  Yes, I sure will.  Are you okay, you sound like you are crying?  "I will be, Dr. Everardo AllEllison and I just got into it."  I hope everything works out.

## 2017-03-05 NOTE — Telephone Encounter (Addendum)
[employer] faxed results of PA - Covered by pt's plan. [name]. [place] [employer] states will use a surgery appropriate graft.

## 2017-03-05 NOTE — Progress Notes (Signed)
Subjective:    Patient ID: [name], male    DOB: 1931/11/17, 84 y.o.   MRN: [id number]  HPI Pt is here for regular wellness examination, and is feeling pretty well in general, and says chronic med probs are stable, except as noted below Past Medical History:  Diagnosis Date  . ANEMIA, IRON DEFICIENCY   . Arthritis   . BACK PAIN, LUMBAR   . Charcot-Marie-Tooth disease    assoiciated muscular dystrophy   . COPD (chronic obstructive pulmonary disease) (HCC)   . Coronary artery disease   . DIABETES MELLITUS, TYPE II   . GERD (gastroesophageal reflux disease)   . H/O: asbestos exposure   . Hypertension   . Leukopenia   . Myocardial infarction (HCC)   . Pernicious anemia   . Pneumonia   . Sleep apnea    uses CPAP nightly  . Smoker    quit 1968--25 ppy    Past Surgical History:  Procedure Laterality Date  . CATARACT EXTRACTION W/ INTRAOCULAR LENS  IMPLANT, BILATERAL    . CORONARY ARTERY BYPASS GRAFT  1995  . CYST EXCISION     on right shoulder  . REVERSE SHOULDER ARTHROPLASTY Left 11/16/2016   Procedure: LEFT REVERSE SHOULDER ARTHROPLASTY;  Surgeon: Beverely Low, MD;  Location: [facility] OR;  Service: Orthopedics;  Laterality: Left;    Social History   Socioeconomic History  . Marital status: Widowed    Spouse name: Not on file  . Number of children: Not on file  . Years of education: Not on file  . Highest education level: Not on file  Social Needs  . Financial resource strain: Not on file  . Food insecurity - worry: Not on file  . Food insecurity - inability: Not on file  . Transportation needs - medical: Not on file  . Transportation needs - non-medical: Not on file  Occupational History  . Occupation: Retired    Associate Professor: RETIRED    Comment: Games developer  Tobacco Use  . Smoking status: Former Smoker    Packs/day: 1.00    Years: 20.00    Pack years: 20.00    Types: Cigarettes    Last attempt to quit: 01/01/1966    Years since quitting: 51.2  .  Smokeless tobacco: Never Used  Substance and Sexual Activity  . Alcohol use: No    Alcohol/week: 0.0 oz  . Drug use: No  . Sexual activity: Not on file  Other Topics Concern  . Not on file  Social History Narrative   Was in service during [place] War.  Lives alone in a one story home.  On disability.         Current Outpatient Medications on File Prior to Visit  Medication Sig Dispense Refill  . ACCU-CHEK SOFTCLIX LANCETS lancets Used to check blood sugars daily. 100 each 12  . acetaminophen (TYLENOL) 500 MG tablet Take 1,000-1,500 mg 2 (two) times daily as needed by mouth for moderate pain.    . Alcohol Swabs (B-D SINGLE USE SWABS REGULAR) PADS Used to check blood sugars daily. 100 each 11  . Ascorbic Acid (VITAMIN C) 500 MG tablet Take 500 mg by mouth 2 (two) times daily.     Marland Kitchen aspirin (GOODSENSE ASPIRIN) 325 MG tablet Take 325 mg daily by mouth.     . Blood Glucose Calibration (ACCU-CHEK AVIVA) SOLN 1 Bottle by In Vitro route as needed. 1 each 0  . Blood Glucose Monitoring Suppl (ACCU-CHEK AVIVA) device Use as instructed 1  each 0  . cholecalciferol (VITAMIN D) 1000 units tablet Take 1,000 Units at bedtime by mouth.    . Cyanocobalamin (B-12 PO) Take 1 tablet daily by mouth.    . Cyanocobalamin (VITAMIN B-12 IJ) GET A B12 INJECTION ONCE EVERY MONTH     . ferrous sulfate 325 (65 FE) MG tablet Take 325 mg 2 (two) times daily by mouth.    . gabapentin (NEURONTIN) 300 MG capsule Take 2 capsules (600 mg total) by mouth at bedtime. 180 capsule 3  . glucose blood (ACCU-CHEK AVIVA) test strip Used to check blood sugars daily. 100 each 12  . Inositol Niacinate (NIACIN FLUSH FREE) 500 MG CAPS Take 500 mg at bedtime by mouth.     Demetra Shiner Devices (SIMPLE DIAGNOSTICS LANCING DEV) MISC   1  . metFORMIN (GLUCOPHAGE-XR) 500 MG 24 hr tablet Take 500 mg by mouth 2 (two) times daily.    . montelukast (SINGULAIR) 10 MG tablet TAKE 1 TABLET BY MOUTH AT  BEDTIME 90 tablet 3  . Multiple Vitamins-Minerals  (MULTIVITAMIN WITH MINERALS) tablet Take 1 tablet by mouth daily.      . sertraline (ZOLOFT) 50 MG tablet TAKE 1 TABLET BY MOUTH  DAILY 90 tablet 3  . simvastatin (ZOCOR) 80 MG tablet Take 1 tablet (80 mg total) by mouth at bedtime. 90 tablet 3  . traMADol (ULTRAM) 50 MG tablet Take 1 tablet (50 mg total) by mouth every 12 (twelve) hours as needed. 60 tablet 5  . Zinc 50 MG CAPS Take 50 mg at bedtime by mouth.      No current facility-administered medications on file prior to visit.     No Known Allergies  Family History  Problem Relation Age of Onset  . Charcot-Marie-Tooth disease Sister   . Charcot-Marie-Tooth disease Brother   . Charcot-Marie-Tooth disease Brother   . Charcot-Marie-Tooth disease Brother   . Charcot-Marie-Tooth disease Sister   . Charcot-Marie-Tooth disease Son   . Charcot-Marie-Tooth disease Other   . COPD Daughter        never smoker    BP 132/70 (BP Location: Left Arm, Patient Position: Sitting, Cuff Size: Normal)   Pulse 63   Wt 188 lb (85.3 kg) Comment: CAM WALKER  SpO2 96%   BMI 26.98 kg/m     Review of Systems Denies fever, visual loss, hearing loss, chest pain, sob, back pain, depression, cold intolerance, BRBPR, hematuria, syncope, allergy sxs, and rash.  He has fatigue and easy bruising.  No change in chronic foot numbness.      Objective:   Physical Exam VS: see vs page GEN: no distress NECK: supple, thyroid is not enlarged CHEST WALL: no deformity LUNGS: clear to auscultation, except for decreased BS at the right base BREASTS:  No gynecomastia CV: reg rate and rhythm, no murmur.  1+ bilat leg edema.  ABD: abdomen is soft, nontender.  no hepatosplenomegaly.  not distended.  no hernia.   GENITALIA/RECTAL/PROSTATE: pt cannot safely be positioned for this. MUSCULOSKELETAL: muscle bulk and strength are grossly normal.  no obvious joint swelling.  gait is steady with a walker.  EXTEMITIES: no leg edema. Right foot is in a special shoe and  brace.  Left foot is bandaged.  PULSES: no carotid bruit. dorsalis pedis intact bilat, but reduced from normal.  NEURO:  cn 2-12 grossly intact.   readily moves all 4's, but strength is decreased on the legs.   SKIN:  Normal texture and temperature.  No suspicious lesion is visible.  Seborrhea is noted on the face.  NODES:  None palpable at the neck.  PSYCH: alert, well-oriented.  Does not appear anxious nor depressed.         Assessment & Plan:  Wellness visit today, with problems stable, except as noted.   Decreased BS at right base.   Her surgical risk is low and outweighed by the potential benefit of the surgery.  She is therefore medically cleared.   Patient Instructions  Please consider these measures for your health:  minimize alcohol.  Do not use tobacco products.  Have a colonoscopy at least every 10 years from age 84.  Keep firearms safely stored.  Always use seat belts.  have working smoke alarms in your home.  See an eye doctor and dentist regularly.  Never drive under the influence of alcohol or drugs (including prescription drugs).  Those with fair skin should take precautions against the sun, and should carefully examine their skin once per month, for any new or changed moles.   A chest x-ray is requested for you today.  We'll let you know about the results.     Please come back for a follow-up appointment in 6 months.

## 2017-03-05 NOTE — Patient Instructions (Addendum)
Please consider these measures for your health:  minimize alcohol.  Do not use tobacco products.  Have a colonoscopy at least every 10 years from age 84.  Keep firearms safely stored.  Always use seat belts.  have working smoke alarms in your home.  See an eye doctor and dentist regularly.  Never drive under the influence of alcohol or drugs (including prescription drugs).  Those with fair skin should take precautions against the sun, and should carefully examine their skin once per month, for any new or changed moles.   A chest x-ray is requested for you today.  We'll let you know about the results.     Please come back for a follow-up appointment in 6 months.

## 2017-03-06 ENCOUNTER — Ambulatory Visit: Payer: Medicare HMO | Admitting: Physician Assistant

## 2017-03-06 ENCOUNTER — Encounter: Payer: Medicare HMO | Admitting: Sports Medicine

## 2017-03-07 DIAGNOSIS — I2510 Atherosclerotic heart disease of native coronary artery without angina pectoris: Secondary | ICD-10-CM | POA: Diagnosis not present

## 2017-03-07 DIAGNOSIS — I5032 Chronic diastolic (congestive) heart failure: Secondary | ICD-10-CM | POA: Diagnosis not present

## 2017-03-07 DIAGNOSIS — E1140 Type 2 diabetes mellitus with diabetic neuropathy, unspecified: Secondary | ICD-10-CM | POA: Diagnosis not present

## 2017-03-07 DIAGNOSIS — Q667 Congenital pes cavus: Secondary | ICD-10-CM | POA: Diagnosis not present

## 2017-03-07 DIAGNOSIS — E11621 Type 2 diabetes mellitus with foot ulcer: Secondary | ICD-10-CM | POA: Diagnosis not present

## 2017-03-07 DIAGNOSIS — Z4800 Encounter for change or removal of nonsurgical wound dressing: Secondary | ICD-10-CM | POA: Diagnosis not present

## 2017-03-07 DIAGNOSIS — L97419 Non-pressure chronic ulcer of right heel and midfoot with unspecified severity: Secondary | ICD-10-CM | POA: Diagnosis not present

## 2017-03-07 DIAGNOSIS — I110 Hypertensive heart disease with heart failure: Secondary | ICD-10-CM | POA: Diagnosis not present

## 2017-03-07 DIAGNOSIS — E1151 Type 2 diabetes mellitus with diabetic peripheral angiopathy without gangrene: Secondary | ICD-10-CM | POA: Diagnosis not present

## 2017-03-08 NOTE — Telephone Encounter (Signed)
I attempted to call Elaine BaileMargarita Ranay.  I left her a message that Dr. Everardo AllEllison cleared her father to have surgery.  I asked her to call if she has any questions.

## 2017-03-12 DIAGNOSIS — Z4800 Encounter for change or removal of nonsurgical wound dressing: Secondary | ICD-10-CM | POA: Diagnosis not present

## 2017-03-12 DIAGNOSIS — I2510 Atherosclerotic heart disease of native coronary artery without angina pectoris: Secondary | ICD-10-CM | POA: Diagnosis not present

## 2017-03-12 DIAGNOSIS — E1151 Type 2 diabetes mellitus with diabetic peripheral angiopathy without gangrene: Secondary | ICD-10-CM | POA: Diagnosis not present

## 2017-03-12 DIAGNOSIS — E1140 Type 2 diabetes mellitus with diabetic neuropathy, unspecified: Secondary | ICD-10-CM | POA: Diagnosis not present

## 2017-03-12 DIAGNOSIS — I110 Hypertensive heart disease with heart failure: Secondary | ICD-10-CM | POA: Diagnosis not present

## 2017-03-12 DIAGNOSIS — I5032 Chronic diastolic (congestive) heart failure: Secondary | ICD-10-CM | POA: Diagnosis not present

## 2017-03-12 DIAGNOSIS — E11621 Type 2 diabetes mellitus with foot ulcer: Secondary | ICD-10-CM | POA: Diagnosis not present

## 2017-03-12 DIAGNOSIS — L97419 Non-pressure chronic ulcer of right heel and midfoot with unspecified severity: Secondary | ICD-10-CM | POA: Diagnosis not present

## 2017-03-12 DIAGNOSIS — Q667 Congenital pes cavus: Secondary | ICD-10-CM | POA: Diagnosis not present

## 2017-03-13 ENCOUNTER — Encounter: Payer: Medicare HMO | Admitting: Sports Medicine

## 2017-03-13 ENCOUNTER — Telehealth: Payer: Self-pay | Admitting: *Deleted

## 2017-03-13 NOTE — Telephone Encounter (Signed)
"  Patient, David RamsayFrank Delgadillo, is scheduled for surgery on Monday.  We need orders from Dr. [name]."

## 2017-03-13 NOTE — Telephone Encounter (Signed)
Ok will do Thanks Dr. SKathie Rhodes

## 2017-03-14 ENCOUNTER — Encounter: Payer: Self-pay | Admitting: Sports Medicine

## 2017-03-14 ENCOUNTER — Ambulatory Visit: Payer: Medicare HMO | Admitting: Sports Medicine

## 2017-03-14 DIAGNOSIS — L97521 Non-pressure chronic ulcer of other part of left foot limited to breakdown of skin: Secondary | ICD-10-CM | POA: Diagnosis not present

## 2017-03-14 DIAGNOSIS — Q667 Congenital pes cavus, unspecified foot: Secondary | ICD-10-CM

## 2017-03-14 DIAGNOSIS — E1140 Type 2 diabetes mellitus with diabetic neuropathy, unspecified: Secondary | ICD-10-CM

## 2017-03-14 DIAGNOSIS — R269 Unspecified abnormalities of gait and mobility: Secondary | ICD-10-CM

## 2017-03-14 DIAGNOSIS — M216X9 Other acquired deformities of unspecified foot: Secondary | ICD-10-CM

## 2017-03-14 DIAGNOSIS — I739 Peripheral vascular disease, unspecified: Secondary | ICD-10-CM

## 2017-03-14 DIAGNOSIS — G600 Hereditary motor and sensory neuropathy: Secondary | ICD-10-CM

## 2017-03-14 NOTE — Progress Notes (Signed)
Patient ID: [name], male   DOB: 1931-05-08, 84 y.o.   MRN: [id number]  Subjective: [name] is a 84 y.o. male patient seen in office for follow up evaluation of left foot, sub-met 1 ulcer.  Patient reports that he has had burning pains to the area especially now since the callus area has not been trimmed and over 1 month since his last visit. Denies chest pain, nausea, vomiting, fever, chills, night sweats.  Reports that he did have issue with elevated temperature due to sinus issues but nothing else recently.  Patient has no other pedal complaints at this time.  Patient is assisted by his family member this visit.  And states that he still has the nurse coming twice a week and his daughter also assisting with dressing changes using Iodosorb.  Patient Active Problem List   Diagnosis Date Noted  . Abnormal breath sounds 03/05/2017  . Chronic pain 02/05/2017  . Acute diastolic CHF (congestive heart failure) ([place]) 12/04/2016  . S/P shoulder replacement, left 11/16/2016  . Preoperative clearance 10/15/2016  . Hyperlipidemia 10/15/2016  . Diabetes ([place]) 03/10/2015  . Perennial and seasonal allergic rhinitis 12/20/2014  . Hematuria 12/20/2014  . Loss of weight 05/06/2014  . Other pancytopenia ([place]) 05/05/2014  . CAP (community acquired pneumonia) 05/05/2014  . Hypocalcemia 05/05/2014  . Skin lesion 04/30/2014  . 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 Abdominal pain, right upper quadrant 05/04/2013  . Cold intolerance 05/04/2013  . Low back pain 04/03/2012  . Encounter for long-term (current) use of other medications 12/11/2011  . Routine general medical examination at a health care facility 12/17/2010  . Screening for prostate cancer 12/12/2010  . Osteoarthrosis, unspecified whether generalized or localized, unspecified site 10/24/2010  . SKIN RASH 06/29/2009  . Iron deficiency anemia 03/10/2009  . BACK PAIN, LUMBAR 03/10/2009  . [place] DISEASE  05/03/2008  . DYSPNEA 05/03/2008  . DEPRESSIVE DISORDER NOT ELSEWHERE CLASSIFIED 05/22/2007  . CHEST PAIN 05/22/2007  . ANEMIA, PERNICIOUS 12/02/2006  . Essential hypertension 07/26/2006  . Coronary atherosclerosis 07/26/2006  . GERD 07/26/2006   Current Outpatient Medications on File Prior to Visit  Medication Sig Dispense Refill  . ACCU-CHEK SOFTCLIX LANCETS lancets Used to check blood sugars daily. 100 each 12  . acetaminophen (TYLENOL) 500 MG tablet Take 1,000-1,500 mg 2 (two) times daily as needed by mouth for moderate pain.    . Alcohol Swabs (B-D SINGLE USE SWABS REGULAR) PADS Used to check blood sugars daily. 100 each 11  . Ascorbic Acid (VITAMIN C) 500 MG tablet Take 500 mg by mouth 2 (two) times daily.     Marland Kitchen aspirin (GOODSENSE ASPIRIN) 325 MG tablet Take 325 mg daily by mouth.     . Blood Glucose Calibration (ACCU-CHEK AVIVA) SOLN 1 Bottle by In Vitro route as needed. 1 each 0  . Blood Glucose Monitoring Suppl (ACCU-CHEK AVIVA) device Use as instructed 1 each 0  . cholecalciferol (VITAMIN D) 1000 units tablet Take 1,000 Units at bedtime by mouth.    . Cyanocobalamin (B-12 PO) Take 1 tablet daily by mouth.    . Cyanocobalamin (VITAMIN B-12 IJ) GET A B12 INJECTION ONCE EVERY MONTH     . ferrous sulfate 325 (65 FE) MG tablet Take 325 mg 2 (two) times daily by mouth.    . gabapentin (NEURONTIN) 300 MG capsule Take 2 capsules (600 mg total) by mouth at bedtime. 180 capsule 3  . glucose blood (ACCU-CHEK AVIVA) test strip Used to check blood  sugars daily. 100 each 12  . Inositol Niacinate (NIACIN FLUSH FREE) 500 MG CAPS Take 500 mg at bedtime by mouth.     Elmore Guise Devices (SIMPLE DIAGNOSTICS LANCING DEV) MISC   1  . metFORMIN (GLUCOPHAGE-XR) 500 MG 24 hr tablet Take 500 mg by mouth 2 (two) times daily.    . montelukast (SINGULAIR) 10 MG tablet TAKE 1 TABLET BY MOUTH AT  BEDTIME 90 tablet 3  . Multiple Vitamins-Minerals (MULTIVITAMIN WITH MINERALS) tablet Take 1 tablet by mouth daily.       . sertraline (ZOLOFT) 50 MG tablet TAKE 1 TABLET BY MOUTH  DAILY 90 tablet 3  . simvastatin (ZOCOR) 80 MG tablet Take 1 tablet (80 mg total) by mouth at bedtime. 90 tablet 3  . traMADol (ULTRAM) 50 MG tablet Take 1 tablet (50 mg total) by mouth every 12 (twelve) hours as needed. 60 tablet 5  . Zinc 50 MG CAPS Take 50 mg at bedtime by mouth.      No current facility-administered medications on file prior to visit.    No Known Allergies  Recent Results (from the past 2160 hour(s))  Basic Metabolic Panel (BMET)     Status: Abnormal   Collection Time: 12/17/16 11:56 AM  Result Value Ref Range   Glucose 109 (H) 65 - 99 mg/dL   BUN 22 8 - 27 mg/dL   Creatinine, Ser 0.80 0.76 - 1.27 mg/dL   GFR calc non Af Amer 83 >59 mL/min/1.73   GFR calc Af Amer 95 >59 mL/min/1.73   BUN/Creatinine Ratio 28 (H) 10 - 24   Sodium 140 134 - 144 mmol/L   Potassium 4.8 3.5 - 5.2 mmol/L   Chloride 102 96 - 106 mmol/L   CO2 25 20 - 29 mmol/L   Calcium 9.3 8.6 - 10.2 mg/dL  Pro b natriuretic peptide (BNP)     Status: Abnormal   Collection Time: 12/17/16 11:56 AM  Result Value Ref Range   NT-Pro BNP 888 (H) 0 - 486 pg/mL    Comment: The following cut-points have been suggested for the use of proBNP for the diagnostic evaluation of heart failure (HF) in patients with acute dyspnea: Modality                     Age           Optimal Cut                            (years)            Point ------------------------------------------------------ Diagnosis (rule in HF)        <50            450 pg/mL                           50 - 75            900 pg/mL                               >75           1800 pg/mL Exclusion (rule out HF)  Age independent     300 pg/mL   Pain Mgmt, Profile 8 w/Conf, U     Status: None   Collection Time: 02/05/17  2:13 PM  Result Value Ref Range  Creatinine 65.5 > or = 20. mg/dL   pH 6.79 4.5 - 9.0   Oxidant NEGATIVE <200 mcg/mL   Amphetamines NEGATIVE <500 ng/mL   medMATCH  Amphetamines CONSISTENT    Benzodiazepines NEGATIVE <100 ng/mL   medMATCH Benzodiazepines CONSISTENT    Marijuana Metabolite NEGATIVE <20 ng/mL   medMATCH Marijuana Metab CONSISTENT    Cocaine Metabolite NEGATIVE <150 ng/mL   medMATCH Cocaine Metab CONSISTENT    Opiates NEGATIVE <100 ng/mL   medMATCH Opiates CONSISTENT    Oxycodone NEGATIVE <100 ng/mL   medMATCH Oxycodone CONSISTENT    Buprenorphine, Urine NEGATIVE <5 ng/mL   medMATCH Buprenorphine CONSISTENT    MDMA NEGATIVE <500 ng/mL   [facility] MDMA CONSISTENT    Alcohol Metabolites NEGATIVE <500 ng/mL   medMATCH Alcohol Metab CONSISTENT    6 Acetylmorphine NEGATIVE <10 ng/mL   medMATCH 6 Acetylmorphine CONSISTENT     Comment: This drug testing is for medical treatment only.   Analysis was performed as non-forensic testing and  these results should be used only by healthcare  providers to render diagnosis or treatment, or to  monitor progress of medical conditions. Hazel Sams comments are:  - present when drug test results may be the result of     metabolism of one or more drugs or when results are     inconsistent with prescribed medication(s) listed.  - may be blank when drug results are consistent with     prescribed medication(s) listed. . For assistance with interpreting these drug results,  please contact a [employer] Toxicology  Specialist: [phone] [place] [phone]), M-F,  8am-6pm EST. This drug testing is for medical treatment only.   Analysis was performed as non-forensic testing and  these results should be used only by healthcare  providers to render diagnosis or treatment, or to  monitor progress of medical conditions. Hazel Sams comments are:  - present when  drug test results may be the result of     metabolism of one or more drugs or when results are     inconsistent with prescribed medication(s) listed.  - may be blank when drug results are consistent with     prescribed medication(s)  listed. . For assistance with interpreting these drug results,  please contact a [employer] Toxicology  Specialist: [phone] [place] [phone]), M-F,  8am-6pm EST. This drug testing is for medical treatment only.   Analysis was performed as non-forensic testing and  these results should be used only by healthcare  providers to render diagnosis or treatment, or to  monitor progress of medical conditions. Hazel Sams comments are:  - present when drug test results may be the result of     metabolism of one or more drugs or when results are     inconsistent with prescribed medication(s) listed.  - may be blank when drug results are consistent with     prescribed medication(s) listed. . For assistance with interpreting these d rug results,  please contact a [employer] Toxicology  Specialist: [phone] [place] [phone]), M-F,  8am-6pm EST. This drug testing is for medical treatment only.   Analysis was performed as non-forensic testing and  these results should be used only by healthcare  providers to render diagnosis or treatment, or to  monitor progress of medical conditions. Hazel Sams comments are:  - present when drug test results may be the result of     metabolism of one or more drugs or when results are     inconsistent with  prescribed medication(s) listed.  - may be blank when drug results are consistent with     prescribed medication(s) listed. . For assistance with interpreting these drug results,  please contact a [employer] Toxicology  Specialist: [phone] [place] [phone]), M-F,  8am-6pm EST. This drug testing is for medical treatment only.   Analysis was performed as non-forensic testing and  these results should be used only by healthcare  provi ders to render diagnosis or treatment, or to  monitor progress of medical conditions. Hazel Sams comments are:  - present when drug test results may be the result of      metabolism of one or more drugs or when results are     inconsistent with prescribed medication(s) listed.  - may be blank when drug results are consistent with     prescribed medication(s) listed. . For assistance with interpreting these drug results,  please contact a [employer] Toxicology  Specialist: [phone] [place] [phone]), M-F,  8am-6pm EST.   HgB A1c     Status: None   Collection Time: 03/05/17  1:14 PM  Result Value Ref Range   Hemoglobin A1C 6.4   Hepatic function panel     Status: None   Collection Time: 03/05/17  1:44 PM  Result Value Ref Range   Total Bilirubin 0.4 0.2 - 1.2 mg/dL   Bilirubin, Direct 0.1 0.0 - 0.3 mg/dL   Alkaline Phosphatase 59 39 - 117 U/L   AST 19 0 - 37 U/L   ALT 15 0 - 53 U/L   Total Protein 6.4 6.0 - 8.3 g/dL   Albumin 4.1 3.5 - 5.2 g/dL  Basic metabolic panel     Status: Abnormal   Collection Time: 03/05/17  1:44 PM  Result Value Ref Range   Sodium 138 135 - 145 mEq/L   Potassium 4.9 3.5 - 5.1 mEq/L   Chloride 103 96 - 112 mEq/L   CO2 29 19 - 32 mEq/L   Glucose, Bld 89 70 - 99 mg/dL   BUN 26 (H) 6 - 23 mg/dL   Creatinine, Ser 0.77 0.40 - 1.50 mg/dL   Calcium 9.3 8.4 - 10.5 mg/dL   GFR 102.48 >60.00 mL/min  Lipid panel     Status: None   Collection Time: 03/05/17  1:44 PM  Result Value Ref Range   Cholesterol 150 0 - 200 mg/dL    Comment: ATP III Classification       Desirable:  < 200 mg/dL               Borderline High:  200 - 239 mg/dL          High:  > = 240 mg/dL   Triglycerides 109.0 0.0 - 149.0 mg/dL    Comment: Normal:  <150 mg/dLBorderline High:  150 - 199 mg/dL   HDL 54.20 >39.00 mg/dL   VLDL 21.8 0.0 - 40.0 mg/dL   LDL Cholesterol 74 0 - 99 mg/dL   Total CHOL/HDL Ratio 3     Comment:                Men          Women1/2 Average Risk     3.4          3.3Average Risk          5.0          4.42X Average Risk          9.6  7.13X Average Risk          15.0          11.0                       NonHDL 95.95      Comment: NOTE:  Non-HDL goal should be 30 mg/dL higher than patient's LDL goal (i.e. LDL goal of < 70 mg/dL, would have non-HDL goal of < 100 mg/dL)  TSH     Status: None   Collection Time: 03/05/17  1:44 PM  Result Value Ref Range   TSH 2.29 0.35 - 4.50 uIU/mL  PSA     Status: None   Collection Time: 03/05/17  1:44 PM  Result Value Ref Range   PSA 2.59 0.10 - 4.00 ng/mL    Comment: Test performed using Access Hybritech PSA Assay, a parmagnetic partical, chemiluminecent immunoassay.  IBC panel     Status: Abnormal   Collection Time: 03/05/17  1:44 PM  Result Value Ref Range   Iron 64 42 - 165 ug/dL   Transferrin 236.0 212.0 - 360.0 mg/dL   Saturation Ratios 19.4 (L) 20.0 - 50.0 %  CBC with Differential/Platelet     Status: Abnormal   Collection Time: 03/05/17  1:44 PM  Result Value Ref Range   WBC 6.6 4.0 - 10.5 K/uL   RBC 4.07 (L) 4.22 - 5.81 Mil/uL   Hemoglobin 11.8 (L) 13.0 - 17.0 g/dL   HCT 36.0 (L) 39.0 - 52.0 %   MCV 88.5 78.0 - 100.0 fl   MCHC 32.7 30.0 - 36.0 g/dL   RDW 14.4 11.5 - 15.5 %   Platelets 182.0 150.0 - 400.0 K/uL   Neutrophils Relative % 68.0 43.0 - 77.0 %   Lymphocytes Relative 18.6 12.0 - 46.0 %   Monocytes Relative 10.7 3.0 - 12.0 %   Eosinophils Relative 2.1 0.0 - 5.0 %   Basophils Relative 0.6 0.0 - 3.0 %   Neutro Abs 4.5 1.4 - 7.7 K/uL   Lymphs Abs 1.2 0.7 - 4.0 K/uL   Monocytes Absolute 0.7 0.1 - 1.0 K/uL   Eosinophils Absolute 0.1 0.0 - 0.7 K/uL   Basophils Absolute 0.0 0.0 - 0.1 K/uL    Objective: There were no vitals filed for this visit.  General: Patient is awake, alert, oriented x 3 and in no acute distress in electric wheelchair.  Dermatology: Skin is warm and dry bilateral with a full-thickness ulceration left sub-met one with  granular base with reactive keratosis that measures 1.0 x 1.0 x 0.2 cm with no other surrounding signs of infection.  1+ pitting edema bilateral. Nails x 10 mycotic but short.    Vascular: Dorsalis Pedis  pulse = 1/4 Bilateral,  Posterior Tibial pulse = 0/4 Bilateral,  Capillary Fill Time < 5 seconds  Neurologic: Protective sensation absent bilateral using the 5.07/10g Semmes Weinstein Monofilament.  Subjective burning sensation around the ulcerated area.  Musculosketal: No pain with palpation to ulcerated area. Pes cavus foot type with prominent first metatarsal head bilateral, left greater than right. Hammertoes with Muscle weakness requiring bracing bilateral from Charcot-Marie-Tooth.  Assessment and Plan:  Problem List Items Addressed This Visit      Nervous and Auditory   CHARCOT-MARIE-TOOTH DISEASE    Other Visit Diagnoses    Foot ulcer, left, limited to breakdown of skin ([place])    -  Primary   Prominent metatarsal head, unspecified laterality       Type 2  diabetes, controlled, with neuropathy (HCC)       Pes cavus, congenital       Abnormality of gait       PVD (peripheral vascular disease) ([place])         -Examined patient  -Mechanically debrided left sub-met one ulceration with sterile chisel blade.  Upon completing debridement to healthy bleeding tissue.  Cleansed ulcertation then applied Iodosorb dressing to left covered with dry dressings.  Patient's daughter and home nursing via [facility] to assist patient with dressing changes consisting of the same as previous until time for surgery.  -Continue with cam boot -Continue with gabapentin for neuropathy -Patient is scheduled for outpatient surgery on Monday, 03/18/2017 for wound debridement removal of sesamoid and application of graft. -Patient to return to office after surgery.   Landis Martins, DPM

## 2017-03-18 ENCOUNTER — Ambulatory Visit (HOSPITAL_BASED_OUTPATIENT_CLINIC_OR_DEPARTMENT_OTHER): Payer: Medicare HMO | Admitting: Anesthesiology

## 2017-03-18 ENCOUNTER — Encounter: Payer: Self-pay | Admitting: Sports Medicine

## 2017-03-18 ENCOUNTER — Encounter (HOSPITAL_BASED_OUTPATIENT_CLINIC_OR_DEPARTMENT_OTHER): Payer: Self-pay | Admitting: Anesthesiology

## 2017-03-18 ENCOUNTER — Encounter (HOSPITAL_BASED_OUTPATIENT_CLINIC_OR_DEPARTMENT_OTHER)
Admission: RE | Disposition: A | Discharge status: Home / Self Care | Payer: Self-pay | Source: Ambulatory Visit | Attending: Sports Medicine

## 2017-03-18 ENCOUNTER — Telehealth: Payer: Self-pay | Admitting: *Deleted

## 2017-03-18 ENCOUNTER — Ambulatory Visit (HOSPITAL_BASED_OUTPATIENT_CLINIC_OR_DEPARTMENT_OTHER)
Admission: RE | Admit: 2017-03-18 | Discharge: 2017-03-18 | Disposition: A | Discharge status: Home / Self Care | Payer: Medicare HMO | Source: Ambulatory Visit | Attending: Sports Medicine | Admitting: Sports Medicine

## 2017-03-18 DIAGNOSIS — I252 Old myocardial infarction: Secondary | ICD-10-CM | POA: Diagnosis not present

## 2017-03-18 DIAGNOSIS — G7100 Muscular dystrophy, unspecified: Secondary | ICD-10-CM | POA: Insufficient documentation

## 2017-03-18 DIAGNOSIS — F329 Major depressive disorder, single episode, unspecified: Secondary | ICD-10-CM | POA: Insufficient documentation

## 2017-03-18 DIAGNOSIS — Z7984 Long term (current) use of oral hypoglycemic drugs: Secondary | ICD-10-CM | POA: Diagnosis not present

## 2017-03-18 DIAGNOSIS — E785 Hyperlipidemia, unspecified: Secondary | ICD-10-CM | POA: Insufficient documentation

## 2017-03-18 DIAGNOSIS — L97528 Non-pressure chronic ulcer of other part of left foot with other specified severity: Secondary | ICD-10-CM | POA: Diagnosis not present

## 2017-03-18 DIAGNOSIS — Z79899 Other long term (current) drug therapy: Secondary | ICD-10-CM | POA: Insufficient documentation

## 2017-03-18 DIAGNOSIS — J449 Chronic obstructive pulmonary disease, unspecified: Secondary | ICD-10-CM | POA: Diagnosis not present

## 2017-03-18 DIAGNOSIS — Z951 Presence of aortocoronary bypass graft: Secondary | ICD-10-CM | POA: Diagnosis not present

## 2017-03-18 DIAGNOSIS — M216X2 Other acquired deformities of left foot: Secondary | ICD-10-CM | POA: Diagnosis not present

## 2017-03-18 DIAGNOSIS — E11621 Type 2 diabetes mellitus with foot ulcer: Secondary | ICD-10-CM | POA: Diagnosis not present

## 2017-03-18 DIAGNOSIS — D509 Iron deficiency anemia, unspecified: Secondary | ICD-10-CM | POA: Insufficient documentation

## 2017-03-18 DIAGNOSIS — Z7982 Long term (current) use of aspirin: Secondary | ICD-10-CM | POA: Diagnosis not present

## 2017-03-18 DIAGNOSIS — Z87891 Personal history of nicotine dependence: Secondary | ICD-10-CM | POA: Insufficient documentation

## 2017-03-18 DIAGNOSIS — Z9989 Dependence on other enabling machines and devices: Secondary | ICD-10-CM | POA: Diagnosis not present

## 2017-03-18 DIAGNOSIS — I110 Hypertensive heart disease with heart failure: Secondary | ICD-10-CM | POA: Diagnosis not present

## 2017-03-18 DIAGNOSIS — G600 Hereditary motor and sensory neuropathy: Secondary | ICD-10-CM | POA: Diagnosis not present

## 2017-03-18 DIAGNOSIS — I5031 Acute diastolic (congestive) heart failure: Secondary | ICD-10-CM | POA: Diagnosis not present

## 2017-03-18 DIAGNOSIS — E1142 Type 2 diabetes mellitus with diabetic polyneuropathy: Secondary | ICD-10-CM | POA: Diagnosis not present

## 2017-03-18 DIAGNOSIS — I10 Essential (primary) hypertension: Secondary | ICD-10-CM | POA: Insufficient documentation

## 2017-03-18 DIAGNOSIS — I2510 Atherosclerotic heart disease of native coronary artery without angina pectoris: Secondary | ICD-10-CM | POA: Insufficient documentation

## 2017-03-18 DIAGNOSIS — L97529 Non-pressure chronic ulcer of other part of left foot with unspecified severity: Secondary | ICD-10-CM | POA: Insufficient documentation

## 2017-03-18 DIAGNOSIS — I351 Nonrheumatic aortic (valve) insufficiency: Secondary | ICD-10-CM | POA: Insufficient documentation

## 2017-03-18 DIAGNOSIS — Z96612 Presence of left artificial shoulder joint: Secondary | ICD-10-CM | POA: Insufficient documentation

## 2017-03-18 DIAGNOSIS — M84872 Other disorders of continuity of bone, left ankle and foot: Secondary | ICD-10-CM | POA: Diagnosis not present

## 2017-03-18 HISTORY — PX: GRAFT APPLICATION: SHX6696

## 2017-03-18 HISTORY — PX: SESAMOIDECTOMY: SHX6418

## 2017-03-18 HISTORY — PX: INCISION AND DRAINAGE OF WOUND: SHX1803

## 2017-03-18 LAB — GLUCOSE, CAPILLARY
GLUCOSE-CAPILLARY: 84 mg/dL (ref 65–99)
GLUCOSE-CAPILLARY: 96 mg/dL (ref 65–99)

## 2017-03-18 SURGERY — EXCISION, SESAMOID BONE
Anesthesia: Monitor Anesthesia Care | Site: Foot | Laterality: Left

## 2017-03-18 MED ORDER — CHLORHEXIDINE GLUCONATE CLOTH 2 % EX PADS: 6.0000 | MEDICATED_PAD | Freq: Once | CUTANEOUS | Status: DC

## 2017-03-18 MED ORDER — LIDOCAINE HCL (CARDIAC) 20 MG/ML IV SOLN
INTRAVENOUS | Status: DC | PRN
  Administered 2017-03-18: 60 mg via INTRAVENOUS

## 2017-03-18 MED ORDER — BUPIVACAINE HCL (PF) 0.5 % IJ SOLN
INTRAMUSCULAR | Status: DC | PRN
  Administered 2017-03-18: 10 mL

## 2017-03-18 MED ORDER — ONDANSETRON HCL 4 MG/2ML IJ SOLN
INTRAMUSCULAR | Status: AC
  Filled 2017-03-18: qty 6

## 2017-03-18 MED ORDER — BUPIVACAINE HCL (PF) 0.5 % IJ SOLN
INTRAMUSCULAR | Status: AC
  Filled 2017-03-18: qty 30

## 2017-03-18 MED ORDER — EPHEDRINE 5 MG/ML INJ
INTRAVENOUS | Status: AC
  Filled 2017-03-18: qty 10

## 2017-03-18 MED ORDER — ONDANSETRON HCL 4 MG/2ML IJ SOLN
INTRAMUSCULAR | Status: AC
Start: 2017-03-18 — End: 2017-03-18
  Filled 2017-03-18: qty 2

## 2017-03-18 MED ORDER — LIDOCAINE HCL 1 % IJ SOLN
INTRAMUSCULAR | Status: DC | PRN
  Administered 2017-03-18: 10 mL

## 2017-03-18 MED ORDER — PHENYLEPHRINE 40 MCG/ML (10ML) SYRINGE FOR IV PUSH (FOR BLOOD PRESSURE SUPPORT)
PREFILLED_SYRINGE | INTRAVENOUS | Status: DC | PRN
  Administered 2017-03-18 (×10): 40 ug via INTRAVENOUS

## 2017-03-18 MED ORDER — LACTATED RINGERS IV SOLN
INTRAVENOUS | Status: DC
  Administered 2017-03-18: 13:00:00 via INTRAVENOUS

## 2017-03-18 MED ORDER — CEFAZOLIN SODIUM-DEXTROSE 2-4 GM/100ML-% IV SOLN
INTRAVENOUS | Status: AC
  Filled 2017-03-18: qty 100

## 2017-03-18 MED ORDER — PHENYLEPHRINE 40 MCG/ML (10ML) SYRINGE FOR IV PUSH (FOR BLOOD PRESSURE SUPPORT)
PREFILLED_SYRINGE | INTRAVENOUS | Status: AC
  Filled 2017-03-18: qty 10

## 2017-03-18 MED ORDER — FENTANYL CITRATE (PF) 100 MCG/2ML IJ SOLN: 50.0000 ug | INTRAMUSCULAR | Status: DC | PRN

## 2017-03-18 MED ORDER — BUPIVACAINE HCL (PF) 0.25 % IJ SOLN
INTRAMUSCULAR | Status: AC
  Filled 2017-03-18: qty 30

## 2017-03-18 MED ORDER — LIDOCAINE HCL (PF) 1 % IJ SOLN
INTRAMUSCULAR | Status: AC
  Filled 2017-03-18: qty 30

## 2017-03-18 MED ORDER — SCOPOLAMINE 1 MG/3DAYS TD PT72: 1.0000 | MEDICATED_PATCH | Freq: Once | TRANSDERMAL | Status: DC | PRN

## 2017-03-18 MED ORDER — ONDANSETRON HCL 4 MG/2ML IJ SOLN: 4.0000 mg | Freq: Once | INTRAMUSCULAR | Status: DC | PRN

## 2017-03-18 MED ORDER — MIDAZOLAM HCL 2 MG/2ML IJ SOLN: 1.0000 mg | INTRAMUSCULAR | Status: DC | PRN

## 2017-03-18 MED ORDER — CEFAZOLIN SODIUM-DEXTROSE 2-4 GM/100ML-% IV SOLN
2.0000 g | INTRAVENOUS | Status: AC
  Administered 2017-03-18: 2 g via INTRAVENOUS

## 2017-03-18 MED ORDER — TRAMADOL HCL 50 MG PO TABS
50.0000 mg | ORAL_TABLET | Freq: Two times a day (BID) | ORAL | 0 refills | Status: AC | PRN
Start: 2017-03-18 — End: 2018-03-18

## 2017-03-18 MED ORDER — PROMETHAZINE HCL 12.5 MG PO TABS: 12.5000 mg | ORAL_TABLET | Freq: Four times a day (QID) | ORAL | 0 refills | Status: AC | PRN

## 2017-03-18 MED ORDER — PROPOFOL 500 MG/50ML IV EMUL
INTRAVENOUS | Status: DC | PRN
  Administered 2017-03-18: 50 ug/kg/min via INTRAVENOUS

## 2017-03-18 MED ORDER — DOCUSATE SODIUM 100 MG PO CAPS: 100.0000 mg | ORAL_CAPSULE | Freq: Every day | ORAL | 2 refills | Status: AC | PRN

## 2017-03-18 MED ORDER — EPHEDRINE SULFATE-NACL 50-0.9 MG/10ML-% IV SOSY
PREFILLED_SYRINGE | INTRAVENOUS | Status: DC | PRN
  Administered 2017-03-18 (×3): 5 mg via INTRAVENOUS

## 2017-03-18 SURGICAL SUPPLY — 45 items
ALLOGRAFT EPICORD UMBILICL 2X3 (Graft) ×1 IMPLANT
BANDAGE ACE 4X5 VEL STRL LF (GAUZE/BANDAGES/DRESSINGS) ×3 IMPLANT
BLADE SURG 15 STRL LF DISP TIS (BLADE) ×2 IMPLANT
BLADE SURG 15 STRL SS (BLADE) ×4
BNDG ESMARK 4X9 LF (GAUZE/BANDAGES/DRESSINGS) ×3 IMPLANT
BNDG GAUZE ELAST 4 BULKY (GAUZE/BANDAGES/DRESSINGS) ×3 IMPLANT
COVER BACK TABLE 60X90IN (DRAPES) ×3 IMPLANT
CUFF TOURNIQUET SINGLE 18IN (TOURNIQUET CUFF) ×3 IMPLANT
DRAPE EXTREMITY T 121X128X90 (DRAPE) ×3 IMPLANT
DRAPE IMP U-DRAPE 54X76 (DRAPES) ×3 IMPLANT
DRAPE U-SHAPE 47X51 STRL (DRAPES) ×3 IMPLANT
DRSG EMULSION OIL 3X3 NADH (GAUZE/BANDAGES/DRESSINGS) ×3 IMPLANT
DRSG PAD ABDOMINAL 8X10 ST (GAUZE/BANDAGES/DRESSINGS) ×3 IMPLANT
DURAPREP 26ML APPLICATOR (WOUND CARE) ×3 IMPLANT
ELECT REM PT RETURN 9FT ADLT (ELECTROSURGICAL) ×3
ELECTRODE REM PT RTRN 9FT ADLT (ELECTROSURGICAL) ×1 IMPLANT
EPICORD UMBILICAL 2X3 (Graft) ×3 IMPLANT
GAUZE SPONGE 4X4 12PLY STRL (GAUZE/BANDAGES/DRESSINGS) ×3 IMPLANT
GLOVE BIO SURGEON STRL SZ 6.5 (GLOVE) ×2 IMPLANT
GLOVE BIO SURGEONS STRL SZ 6.5 (GLOVE) ×1
GLOVE BIOGEL M STRL SZ7.5 (GLOVE) ×3 IMPLANT
GLOVE BIOGEL PI IND STRL 6.5 (GLOVE) ×2 IMPLANT
GLOVE BIOGEL PI IND STRL 8 (GLOVE) ×1 IMPLANT
GLOVE BIOGEL PI INDICATOR 6.5 (GLOVE) ×4
GLOVE BIOGEL PI INDICATOR 8 (GLOVE) ×2
GOWN STRL REUS W/ TWL LRG LVL3 (GOWN DISPOSABLE) ×2 IMPLANT
GOWN STRL REUS W/TWL LRG LVL3 (GOWN DISPOSABLE) ×4
NDL SAFETY ECLIPSE 18X1.5 (NEEDLE) ×1 IMPLANT
NEEDLE HYPO 18GX1.5 SHARP (NEEDLE) ×2
NEEDLE HYPO 25X1 1.5 SAFETY (NEEDLE) ×6 IMPLANT
NS IRRIG 1000ML POUR BTL (IV SOLUTION) ×3 IMPLANT
PACK BASIN DAY SURGERY FS (CUSTOM PROCEDURE TRAY) ×3 IMPLANT
PENCIL BUTTON HOLSTER BLD 10FT (ELECTRODE) ×3 IMPLANT
PROBE DEBRIDE SONICVAC MISONIX (TIP) ×3 IMPLANT
STOCKINETTE 6  STRL (DRAPES) ×2
STOCKINETTE 6 STRL (DRAPES) ×1 IMPLANT
STOCKINETTE SYNTHETIC 4 NONSTR (MISCELLANEOUS) IMPLANT
SUT MNCRL AB 3-0 PS2 18 (SUTURE) ×3 IMPLANT
SUT PROLENE 0 CT 2 (SUTURE) ×3 IMPLANT
SUT PROLENE 3 0 PS 2 (SUTURE) ×3 IMPLANT
SYR 10ML LL (SYRINGE) ×6 IMPLANT
SYR BULB 3OZ (MISCELLANEOUS) ×3 IMPLANT
TOWEL OR 17X24 6PK STRL BLUE (TOWEL DISPOSABLE) ×3 IMPLANT
TUBE IRRIGATION SET MISONIX (TUBING) ×3 IMPLANT
UNDERPAD 30X30 (UNDERPADS AND DIAPERS) ×3 IMPLANT

## 2017-03-18 NOTE — Transfer of Care (Signed)
Immediate Anesthesia Transfer of Care Note  Patient: [name]  Procedure(s) Performed: SESAMOIDECTOMY OF LEFT FOOT (Left Foot) IRRIGATION AND DEBRIDEMENT WOUND OF LEFT FOOT (Left Foot) EpiCord GRAFT APPLICATION OF LEFT FOOT (Left Foot)  Patient Location: PACU  Anesthesia Type:MAC  Level of Consciousness: awake, alert  and oriented  Airway & Oxygen Therapy: Patient Spontanous Breathing  Post-op Assessment: Report given to RN and Post -op Vital signs reviewed and stable  Post vital signs: Reviewed and stable  Last Vitals:  Vitals:   03/18/17 1240  BP: (!) 123/58  Pulse: 62  Temp: 36.6 C  SpO2: 98%    Last Pain:  Vitals:   03/18/17 1240  TempSrc: Oral  PainSc: 0-No pain         Complications: No apparent anesthesia complications

## 2017-03-18 NOTE — Telephone Encounter (Signed)
-----   Message from Asencion Islamitorya Stover, [place] sent at 03/18/2017  2:35 PM EDT ----- Regarding: Updated wound care orders Twice weekly adaptic and steristrips to the plantar surgical site all covered with 4x4, abd, kerlix, and ace wrap. -Dr. [name]

## 2017-03-18 NOTE — Anesthesia Preprocedure Evaluation (Addendum)
Anesthesia Evaluation  Patient identified by MRN, date of birth, ID band Patient awake    Reviewed: Allergy & Precautions, NPO status , Patient's Chart, lab work & pertinent test results  Airway Mallampati: III  TM Distance: >3 FB Neck ROM: Full    Dental  (+) Edentulous Lower, Edentulous Upper   Pulmonary sleep apnea and Continuous Positive Airway Pressure Ventilation , COPD, former smoker,   Very limited pulmonary reserve    + decreased breath sounds      Cardiovascular hypertension, + CAD, + Past MI, + CABG and +CHF  Normal cardiovascular exam Rhythm:Regular Rate:Normal  Echo 12/17/16: Study Conclusions  - Left ventricle: The cavity size was normal. Wall thickness was normal. Systolic function was normal. The estimated ejection fraction was in the range of 60% to 65%. Wall motion was normal; there were no regional wall motion abnormalities. Left ventricular diastolic function parameters were normal for the patient&'s age. - Aortic valve: Moderately calcified annulus. Mildly thickened, mildly calcified leaflets. There was mild regurgitation. Valve area (VTI): 2.04 cm^2. Valve area (Vmax): 2.02 cm^2. Valve area (Vmean): 1.85 cm^2. - Right atrium: The atrium was mildly dilated.   Neuro/Psych PSYCHIATRIC DISORDERS Depression Muscular dystrophy  Neuromuscular disease    GI/Hepatic Neg liver ROS, GERD  ,  Endo/Other  diabetes, Type 2, Oral Hypoglycemic Agents  Renal/GU negative Renal ROS     Musculoskeletal  (+) Arthritis , Charcot Marie tooth disease   Abdominal   Peds  Hematology  (+) Blood dyscrasia, anemia ,   Anesthesia Other Findings Day of surgery medications reviewed with the patient.  Reproductive/Obstetrics                            Anesthesia Physical Anesthesia Plan  ASA: IV  Anesthesia Plan: MAC   Post-op Pain Management:    Induction: Intravenous  PONV Risk Score and  Plan: 1 and Treatment may vary due to age or medical condition and TIVA  Airway Management Planned: Simple Face Mask  Additional Equipment:   Intra-op Plan:   Post-operative Plan:   Informed Consent: I have reviewed the patients History and Physical, chart, labs and discussed the procedure including the risks, benefits and alternatives for the proposed anesthesia with the patient or authorized representative who has indicated his/her understanding and acceptance.   Dental advisory given  Plan Discussed with: CRNA and Anesthesiologist  Anesthesia Plan Comments:        Anesthesia Quick Evaluation

## 2017-03-18 NOTE — Brief Op Note (Signed)
03/18/2017  2:20 PM  PATIENT:  [name]  84 y.o. male  PRE-OPERATIVE DIAGNOSIS:  DIABETIC ULCER  POST-OPERATIVE DIAGNOSIS:  DIABETIC ULCER  PROCEDURE:  Procedure(s): SESAMOIDECTOMY OF LEFT FOOT (Left) IRRIGATION AND DEBRIDEMENT WOUND OF LEFT FOOT (Left) EpiCord GRAFT APPLICATION OF LEFT FOOT (Left)  SURGEON:  Surgeon(s) and Role:    * Landis Martins, DPM - Primary  PHYSICIAN ASSISTANT:   ASSISTANTS: none   ANESTHESIA:   MAC  EBL:  5 mL   BLOOD ADMINISTERED:none  DRAINS: none   LOCAL MEDICATIONS USED:  MARCAINE AND LIDOCAINE  SPECIMEN:  Source of Specimen:  Left foot ulceration (Path) wound culture swab (Micro)  DISPOSITION OF SPECIMEN:  PATHOLOGY  COUNTS:  YES  TOURNIQUET:   Total Tourniquet Time Documented: Leg (Left) - 45 minutes Total: Leg (Left) - 45 minutes   DICTATION: .Note written in EPIC  PLAN OF CARE: Discharge to home after PACU  PATIENT DISPOSITION:  PACU - hemodynamically stable.   Delay start of Pharmacological VTE agent (>24hrs) due to surgical blood loss or risk of bleeding: no

## 2017-03-18 NOTE — Op Note (Signed)
DATE OF SURGERY: 03-18-17  PREOPERATIVE DIAGNOSES: 1.  Chronic left diabetic foot ulceration 2.  Prominent bone/sesamoid left foot 3.  Diabetes with Charcot-Marie-Tooth and peripheral neuropathy  POSTOPERATIVE DIAGNOSES:  As above PROCEDURES PERFORMED: 1.  Left foot wound debridement with application of Epicord 2x3 graft ([id number], 6 units and [id number])  2.  Removal of prominent sesamoid bones/sesamoidectomy ([id number])  SURGEON: Asencion Islamitorya Taresa [place], DPM   ASSISTANT: None  ESTIMATED BLOOD LOSS:  Minimal.   HEMOSTASIS:  Left ankle tourniquet    ANESTHESIA: Monitored anesthesia care with local  INDICATIONS FOR PROCEDURE: This male patient seen on several occasions for local wound care with no complete healing or improvement with continued re-ulceration in the setting of diabetes with peripheral neuropathy.  Multiple conservative methods such as wide shoes, accommodative padding and aggressive local wound care was performed on an outpatient basis with Dr.  [name] all of which have provided inadequate results. At this time, he desires surgical management.  The risks versus benefits of the procedure have been discussed with the patient in detail by Dr. [name]  and the consent is available on the chart for review.  PROCEDURE IN DETAIL: The patient was taken to the operating room via cart and placed on the operative table in supine position and a safety strap was placed across his waist for his protection. Copious amounts of Webril were applied about the left ankle and a pneumatic ankle tourniquet was placed over the Webril.  After adequate IV sedation was administered by the Department of Anesthesia, The foot was elevated off the table. Esmarch bandages were used to exsanguinate the left foot. The pneumatic ankle tourniquet was elevated to 250 mmHg. The foot was lowered in the operative field and the sterile stockinet was reflected. A sterile Betadine was wiped away with a wet and dry sponge and one-two  pickup was used to test anesthesia, which was found to be adequate. Attention was directed to the first metatarsophalangeal joint at the plantar aspect, which was found to be contracted, with hammertoe deformity and prominent metatarsal head and sesamoid bone.  A #15 blade was used to elliptically excise out the previous ulceration which measured approximately 1.5 x 1.5 cm once down to the level of subcutaneous fat a fresh A#15 blade was used to deepen the incision down to the level of the joint capsule. All superficial subcutaneous vessels were ligated with electrocautery. Next, a linear capsular incision was made down the bone with a #15 blade. The capsule was elevated medially and laterally off the underlying bone and the sesamoids were delivered into the wound bed.  The soft tissues were released freeing  the fibular and tibial sesamoid complex of which was resected and removed and passed off to the back table to be sent as specimen along with ulcerated tissue.  At this time wound cultures were obtained.  Attention was directed to the wound bed of which was debrided using Misonix ultrasound debridement device, post debridement measurements were 2x3x1cm to level of tendon and joint.  The wound bed appeared healthy with viable tissue thus utilizing manufacturer's technique the Epicord 2x3cm [id number], re-order # [id number] exp 08/01/21 graft was placed covering the wound bed and stitch to prevent translocation of the graft using 3-0 Prolene. The area was dressed with Adaptic and Steri-Strips were applied followed by standard postoperative dressing consisting of 4 x 4s, Kling, Kerlix, and Ace. The pneumatic ankle tourniquet was released and immediate hyperemic flush was noted to the digits.   The patient tolerated the  above anesthesia and procedure without complications.  He was transported via cart to the Postanesthesia Care Unit with vital signs stable and vascular status intact to the left foot. He is to  be non-weightbearing with walker, crutches, or electric wheelchair.  He was given emergency contact numbers and instructions to call if problems arise.  He was given prescriptions.  He was discharged in stable condition.  He is to follow up with Dr. Marylene Land in office in 1 week.   Asencion Islam, DPM

## 2017-03-18 NOTE — H&P (Signed)
H&P: Podiatry   DPO:EUMPN J Delira 84 y.o. male  patient with a history of DFU seen on several occassions in office for chronic ulceration with occasional burning pain; Patient has tried multiple conservative treatments and opted for Surgical management. Patient had pre-op physical and clearance for wound debridement with removal of sesamoids and application of graft on left completed. Patient met this PM in pre-op holding area; informed consent reviewed and signed. All questions answered. Left foot/surgical site marked.  This PM patient denies any other pedal complaints. Denies Nausea/fever/vomiting/chills/night sweats/overnight events. Confirms NPO since midnight.   Patient Active Problem List   Diagnosis Date Noted  . Abnormal breath sounds 03/05/2017  . Chronic pain 02/05/2017  . Acute diastolic CHF (congestive heart failure) ([place]) 12/04/2016  . S/P shoulder replacement, left 11/16/2016  . Preoperative clearance 10/15/2016  . Hyperlipidemia 10/15/2016  . Diabetes ([place]) 03/10/2015  . Perennial and seasonal allergic rhinitis 12/20/2014  . Hematuria 12/20/2014  . Loss of weight 05/06/2014  . Other pancytopenia ([place]) 05/05/2014  . CAP (community acquired pneumonia) 05/05/2014  . Hypocalcemia 05/05/2014  . Skin lesion 04/30/2014  . 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 Abdominal pain, right upper quadrant 05/04/2013  . Cold intolerance 05/04/2013  . Low back pain 04/03/2012  . Encounter for long-term (current) use of other medications 12/11/2011  . Routine general medical examination at a health care facility 12/17/2010  . Screening for prostate cancer 12/12/2010  . Osteoarthrosis, unspecified whether generalized or localized, unspecified site 10/24/2010  . SKIN RASH 06/29/2009  . Iron deficiency anemia 03/10/2009  . BACK PAIN, LUMBAR 03/10/2009  . Glandorf DISEASE 05/03/2008  . DYSPNEA 05/03/2008  . DEPRESSIVE DISORDER NOT ELSEWHERE CLASSIFIED 05/22/2007   . CHEST PAIN 05/22/2007  . ANEMIA, PERNICIOUS 12/02/2006  . Essential hypertension 07/26/2006  . Coronary atherosclerosis 07/26/2006  . GERD 07/26/2006    No current facility-administered medications on file prior to encounter.    Current Outpatient Medications on File Prior to Encounter  Medication Sig Dispense Refill  . Ascorbic Acid (VITAMIN C) 500 MG tablet Take 500 mg by mouth 2 (two) times daily.     Marland Kitchen aspirin (GOODSENSE ASPIRIN) 325 MG tablet Take 325 mg daily by mouth.     . cholecalciferol (VITAMIN D) 1000 units tablet Take 1,000 Units at bedtime by mouth.    . Cyanocobalamin (B-12 PO) Take 1 tablet daily by mouth.    . ferrous sulfate 325 (65 FE) MG tablet Take 325 mg 2 (two) times daily by mouth.    . gabapentin (NEURONTIN) 300 MG capsule Take 2 capsules (600 mg total) by mouth at bedtime. 180 capsule 3  . Inositol Niacinate (NIACIN FLUSH FREE) 500 MG CAPS Take 500 mg at bedtime by mouth.     . metFORMIN (GLUCOPHAGE-XR) 500 MG 24 hr tablet Take 500 mg by mouth 2 (two) times daily.    . montelukast (SINGULAIR) 10 MG tablet TAKE 1 TABLET BY MOUTH AT  BEDTIME 90 tablet 3  . Multiple Vitamins-Minerals (MULTIVITAMIN WITH MINERALS) tablet Take 1 tablet by mouth daily.      . sertraline (ZOLOFT) 50 MG tablet TAKE 1 TABLET BY MOUTH  DAILY 90 tablet 3  . simvastatin (ZOCOR) 80 MG tablet Take 1 tablet (80 mg total) by mouth at bedtime. 90 tablet 3  . traMADol (ULTRAM) 50 MG tablet Take 1 tablet (50 mg total) by mouth every 12 (twelve) hours as needed. 60 tablet 5  . Zinc 50 MG CAPS Take 50 mg at  bedtime by mouth.     Marland Kitchen ACCU-CHEK SOFTCLIX LANCETS lancets Used to check blood sugars daily. 100 each 12  . acetaminophen (TYLENOL) 500 MG tablet Take 1,000-1,500 mg 2 (two) times daily as needed by mouth for moderate pain.    . Alcohol Swabs (B-D SINGLE USE SWABS REGULAR) PADS Used to check blood sugars daily. 100 each 11  . Blood Glucose Calibration (ACCU-CHEK AVIVA) SOLN 1 Bottle by In Vitro  route as needed. 1 each 0  . Blood Glucose Monitoring Suppl (ACCU-CHEK AVIVA) device Use as instructed 1 each 0  . Cyanocobalamin (VITAMIN B-12 IJ) GET A B12 INJECTION ONCE EVERY MONTH     . glucose blood (ACCU-CHEK AVIVA) test strip Used to check blood sugars daily. 100 each 12  . Lancet Devices (SIMPLE DIAGNOSTICS LANCING DEV) MISC   1     No Known Allergies  Social History   Socioeconomic History  . Marital status: Widowed    Spouse name: Not on file  . Number of children: Not on file  . Years of education: Not on file  . Highest education level: Not on file  Social Needs  . Financial resource strain: Not on file  . Food insecurity - worry: Not on file  . Food insecurity - inability: Not on file  . Transportation needs - medical: Not on file  . Transportation needs - non-medical: Not on file  Occupational History  . Occupation: Retired    Fish farm manager: RETIRED    Comment: Engineer, building services  Tobacco Use  . Smoking status: Former Smoker    Packs/day: 1.00    Years: 20.00    Pack years: 20.00    Types: Cigarettes    Last attempt to quit: 01/01/1966    Years since quitting: 51.2  . Smokeless tobacco: Never Used  Substance and Sexual Activity  . Alcohol use: No    Alcohol/week: 0.0 oz  . Drug use: No  . Sexual activity: Not on file  Other Topics Concern  . Not on file  Social History Narrative   Was in service during [place] War.  Lives alone in a one story home.  On disability.         Past Surgical History:  Procedure Laterality Date  . CATARACT EXTRACTION W/ INTRAOCULAR LENS  IMPLANT, BILATERAL    . CORONARY ARTERY BYPASS GRAFT  1995  . CYST EXCISION     on right shoulder  . REVERSE SHOULDER ARTHROPLASTY Left 11/16/2016   Procedure: LEFT REVERSE SHOULDER ARTHROPLASTY;  Surgeon: Netta Cedars, MD;  Location: [facility];  Service: Orthopedics;  Laterality: Left;    Family History  Problem Relation Age of Onset  . Charcot-Marie-Tooth disease Sister   .  Charcot-Marie-Tooth disease Brother   . Charcot-Marie-Tooth disease Brother   . Charcot-Marie-Tooth disease Brother   . Charcot-Marie-Tooth disease Sister   . Charcot-Marie-Tooth disease Son   . Charcot-Marie-Tooth disease Other   . COPD Daughter        never smoker     REVIEW OF SYSTEMS:Noncontributory   PHYSICAL EXAMINATION:  Today's Vitals   03/13/17 1031  Weight: 188 lb (85.3 kg)  Height: '5\' 10"'$  (1.778 m)    GENERAL: Well-developed, well-nourished, in no acute distress. Alert  and cooperative.  LOWER EXTREMITY EXAM: NO ACUTE CHANGES  Dermatology: Skin is warm and dry bilateral with a full-thickness ulceration left sub-met one with  granular base with reactive keratosis that measures 1.0 x 1.0 x 0.2 cm with no other surrounding signs of infection.  1+ pitting edema bilateral. Nails x 10 mycotic but short.    Vascular: Dorsalis Pedis pulse = 1/4 Bilateral,  Posterior Tibial pulse = 0/4 Bilateral,  Capillary Fill Time < 5 seconds  Neurologic: Protective sensation absent bilateral using the 5.07/10g Semmes Weinstein Monofilament.  Subjective burning sensation around the ulcerated area.  Musculosketal: No pain with palpation to ulcerated area. Pes cavus foot type with prominent first metatarsal head bilateral, left greater than right. Hammertoes with Muscle weakness requiring bracing bilateral from Charcot-Marie-Tooth.  ASSESSMENTS:  1.Chronic diabetic foot ulceration, left 2. DM type 2 with neuropathy 3. Foot deformity/CMT/Promienent sesamoids   PLAN OF CARE: Patient seen and evaluated 1. History and physical completed 2. Patient NPO since midnight 3. Previous In-office Imaging reviewed  4. Consent for surgery explained and obtained for Left foot wound debridement, removal of sesamoids and placement of graft/Epicord; risk and benefits explained; all questions answered and no guarantees granted. 5. Patient to undergo above surgical procedure 6. Case discussed  with patient and to meet with daughter represented after the procedure 7. To resume all home meds post-procedure and to give prn pain meds, anti-nausea, and anti-constipation medications post op 8. Will continue to follow closely/ see post-operative in the office within 1 week.  Landis Martins, DPM

## 2017-03-18 NOTE — Telephone Encounter (Signed)
Faxed Dr. Wynema BirchStover's 03/18/2017 2:35pm [facility]

## 2017-03-18 NOTE — Discharge Instructions (Signed)
Attached to paper chart   Post Anesthesia Home Care Instructions  Activity: Get plenty of rest for the remainder of the day. A responsible individual must stay with you for 24 hours following the procedure.  For the next 24 hours, DO NOT: -Drive a car -Advertising copywriterperate machinery -Drink alcoholic beverages -Take any medication unless instructed by your physician -Make any legal decisions or sign important papers.  Meals: Start with liquid foods such as gelatin or soup. Progress to regular foods as tolerated. Avoid greasy, spicy, heavy foods. If nausea and/or vomiting occur, drink only clear liquids until the nausea and/or vomiting subsides. Call your physician if vomiting continues.  Special Instructions/Symptoms: Your throat may feel dry or sore from the anesthesia or the breathing tube placed in your throat during surgery. If this causes discomfort, gargle with warm salt water. The discomfort should disappear within 24 hours.  If you had a scopolamine patch placed behind your ear for the management of post- operative nausea and/or vomiting:  1. The medication in the patch is effective for 72 hours, after which it should be removed.  Wrap patch in a tissue and discard in the trash. Wash hands thoroughly with soap and water. 2. You may remove the patch earlier than 72 hours if you experience unpleasant side effects which may include dry mouth, dizziness or visual disturbances. 3. Avoid touching the patch. Wash your hands with soap and water after contact with the patch.

## 2017-03-19 ENCOUNTER — Telehealth: Payer: Self-pay | Admitting: Sports Medicine

## 2017-03-19 ENCOUNTER — Encounter (HOSPITAL_BASED_OUTPATIENT_CLINIC_OR_DEPARTMENT_OTHER): Payer: Self-pay | Admitting: Sports Medicine

## 2017-03-19 NOTE — Telephone Encounter (Signed)
Called and spoke with patient and his daughter.  Explained burning sensation is normal after surgery.  Reinforced icing, elevation and scheduling of pain medications also advised using Ibuprofen in between narcotics if necessary for better pain control.

## 2017-03-19 NOTE — Progress Notes (Signed)
DOS 03/18/17 Left Foot Wound Debridement, Removal of Bone and Placement of Graft

## 2017-03-19 NOTE — Telephone Encounter (Signed)
This is normal after surgery

## 2017-03-19 NOTE — Anesthesia Postprocedure Evaluation (Signed)
Anesthesia Post Note  Patient: [name]  Procedure(s) Performed: SESAMOIDECTOMY OF LEFT FOOT (Left Foot) IRRIGATION AND DEBRIDEMENT WOUND OF LEFT FOOT (Left Foot) EpiCord GRAFT APPLICATION OF LEFT FOOT (Left Foot)     Patient location during evaluation: PACU Anesthesia Type: MAC Level of consciousness: awake and alert Pain management: pain level controlled Vital Signs Assessment: post-procedure vital signs reviewed and stable Respiratory status: spontaneous breathing, nonlabored ventilation, respiratory function stable and patient connected to nasal cannula oxygen Cardiovascular status: stable and blood pressure returned to baseline Postop Assessment: no apparent nausea or vomiting Anesthetic complications: no    Last Vitals:  Vitals:   03/18/17 1430 03/18/17 1445  BP: 114/83 (!) 139/95  Pulse:  72  Resp:  16  Temp:  36.5 C  SpO2: 99% 100%    Last Pain:  Vitals:   03/18/17 1445  TempSrc:   PainSc: 0-No pain                 Catalina Gravel

## 2017-03-19 NOTE — Telephone Encounter (Signed)
Post op check phone call made to patient. Daughter answered and reports that her father is doing well. Patient to follow up as scheduled next week or to call office if any issues or problems arise. -Dr. [name]

## 2017-03-19 NOTE — Telephone Encounter (Signed)
Pt's daughter called stated dads foot has been burning and wants to know if it was normal. Please advise

## 2017-03-19 NOTE — Telephone Encounter (Signed)
Hey, this is [place] with [facility]. I'm just calling on KelloggFrank Woodbeck. I didn't see him on Thursday because he came and saw you guys. So I just wanted to make Dr. [name] aware we have a missed visit. If you have any questions, my number is [phone]. Thanks.

## 2017-03-21 ENCOUNTER — Telehealth: Payer: Self-pay | Admitting: *Deleted

## 2017-03-21 DIAGNOSIS — E1140 Type 2 diabetes mellitus with diabetic neuropathy, unspecified: Secondary | ICD-10-CM | POA: Diagnosis not present

## 2017-03-21 DIAGNOSIS — E11621 Type 2 diabetes mellitus with foot ulcer: Secondary | ICD-10-CM | POA: Diagnosis not present

## 2017-03-21 DIAGNOSIS — Z4800 Encounter for change or removal of nonsurgical wound dressing: Secondary | ICD-10-CM | POA: Diagnosis not present

## 2017-03-21 DIAGNOSIS — I2510 Atherosclerotic heart disease of native coronary artery without angina pectoris: Secondary | ICD-10-CM | POA: Diagnosis not present

## 2017-03-21 DIAGNOSIS — I110 Hypertensive heart disease with heart failure: Secondary | ICD-10-CM | POA: Diagnosis not present

## 2017-03-21 DIAGNOSIS — L97419 Non-pressure chronic ulcer of right heel and midfoot with unspecified severity: Secondary | ICD-10-CM | POA: Diagnosis not present

## 2017-03-21 DIAGNOSIS — I5032 Chronic diastolic (congestive) heart failure: Secondary | ICD-10-CM | POA: Diagnosis not present

## 2017-03-21 DIAGNOSIS — Q667 Congenital pes cavus: Secondary | ICD-10-CM | POA: Diagnosis not present

## 2017-03-21 DIAGNOSIS — E1151 Type 2 diabetes mellitus with diabetic peripheral angiopathy without gangrene: Secondary | ICD-10-CM | POA: Diagnosis not present

## 2017-03-21 NOTE — Telephone Encounter (Signed)
"  I got Dr. Wynema BirchStover's orders for the Adaptic twice a week with the 4x4.  I want to clarify if that's twice a week for us or is that twice a week in total?  I know he is seeing her weekly as well.  So I just want to make sure I keep the frequency correct.  He told me he's coming in next Wednesday.  So I just want to make sure I schedule my visits correctly.  Give me a call.

## 2017-03-21 NOTE — Telephone Encounter (Signed)
Left message informing [name] - [facility] the orders for dressing changes were for 2 a week total.

## 2017-03-23 LAB — AEROBIC/ANAEROBIC CULTURE (SURGICAL/DEEP WOUND)

## 2017-03-23 LAB — AEROBIC/ANAEROBIC CULTURE W GRAM STAIN (SURGICAL/DEEP WOUND)

## 2017-03-25 DIAGNOSIS — I2510 Atherosclerotic heart disease of native coronary artery without angina pectoris: Secondary | ICD-10-CM | POA: Diagnosis not present

## 2017-03-25 DIAGNOSIS — E1140 Type 2 diabetes mellitus with diabetic neuropathy, unspecified: Secondary | ICD-10-CM | POA: Diagnosis not present

## 2017-03-25 DIAGNOSIS — I5032 Chronic diastolic (congestive) heart failure: Secondary | ICD-10-CM | POA: Diagnosis not present

## 2017-03-25 DIAGNOSIS — E11621 Type 2 diabetes mellitus with foot ulcer: Secondary | ICD-10-CM | POA: Diagnosis not present

## 2017-03-25 DIAGNOSIS — I110 Hypertensive heart disease with heart failure: Secondary | ICD-10-CM | POA: Diagnosis not present

## 2017-03-25 DIAGNOSIS — Z4800 Encounter for change or removal of nonsurgical wound dressing: Secondary | ICD-10-CM | POA: Diagnosis not present

## 2017-03-25 DIAGNOSIS — L97419 Non-pressure chronic ulcer of right heel and midfoot with unspecified severity: Secondary | ICD-10-CM | POA: Diagnosis not present

## 2017-03-25 DIAGNOSIS — Q667 Congenital pes cavus: Secondary | ICD-10-CM | POA: Diagnosis not present

## 2017-03-25 DIAGNOSIS — E1151 Type 2 diabetes mellitus with diabetic peripheral angiopathy without gangrene: Secondary | ICD-10-CM | POA: Diagnosis not present

## 2017-03-26 DIAGNOSIS — I2510 Atherosclerotic heart disease of native coronary artery without angina pectoris: Secondary | ICD-10-CM | POA: Diagnosis not present

## 2017-03-26 DIAGNOSIS — Q667 Congenital pes cavus: Secondary | ICD-10-CM | POA: Diagnosis not present

## 2017-03-26 DIAGNOSIS — I110 Hypertensive heart disease with heart failure: Secondary | ICD-10-CM | POA: Diagnosis not present

## 2017-03-26 DIAGNOSIS — L97419 Non-pressure chronic ulcer of right heel and midfoot with unspecified severity: Secondary | ICD-10-CM | POA: Diagnosis not present

## 2017-03-26 DIAGNOSIS — E1151 Type 2 diabetes mellitus with diabetic peripheral angiopathy without gangrene: Secondary | ICD-10-CM | POA: Diagnosis not present

## 2017-03-26 DIAGNOSIS — Z4800 Encounter for change or removal of nonsurgical wound dressing: Secondary | ICD-10-CM | POA: Diagnosis not present

## 2017-03-26 DIAGNOSIS — E11621 Type 2 diabetes mellitus with foot ulcer: Secondary | ICD-10-CM | POA: Diagnosis not present

## 2017-03-26 DIAGNOSIS — I5032 Chronic diastolic (congestive) heart failure: Secondary | ICD-10-CM | POA: Diagnosis not present

## 2017-03-26 DIAGNOSIS — E1140 Type 2 diabetes mellitus with diabetic neuropathy, unspecified: Secondary | ICD-10-CM | POA: Diagnosis not present

## 2017-03-27 ENCOUNTER — Ambulatory Visit (INDEPENDENT_AMBULATORY_CARE_PROVIDER_SITE_OTHER): Payer: Medicare HMO | Admitting: Sports Medicine

## 2017-03-27 ENCOUNTER — Telehealth: Payer: Self-pay | Admitting: *Deleted

## 2017-03-27 ENCOUNTER — Other Ambulatory Visit: Payer: Self-pay | Admitting: Sports Medicine

## 2017-03-27 ENCOUNTER — Ambulatory Visit (INDEPENDENT_AMBULATORY_CARE_PROVIDER_SITE_OTHER): Payer: Medicare HMO

## 2017-03-27 ENCOUNTER — Encounter: Payer: Self-pay | Admitting: Sports Medicine

## 2017-03-27 DIAGNOSIS — I739 Peripheral vascular disease, unspecified: Secondary | ICD-10-CM | POA: Diagnosis not present

## 2017-03-27 DIAGNOSIS — M216X9 Other acquired deformities of unspecified foot: Secondary | ICD-10-CM

## 2017-03-27 DIAGNOSIS — G600 Hereditary motor and sensory neuropathy: Secondary | ICD-10-CM

## 2017-03-27 DIAGNOSIS — L97521 Non-pressure chronic ulcer of other part of left foot limited to breakdown of skin: Secondary | ICD-10-CM

## 2017-03-27 DIAGNOSIS — Q667 Congenital pes cavus, unspecified foot: Secondary | ICD-10-CM

## 2017-03-27 DIAGNOSIS — E1140 Type 2 diabetes mellitus with diabetic neuropathy, unspecified: Secondary | ICD-10-CM

## 2017-03-27 DIAGNOSIS — Z9889 Other specified postprocedural states: Secondary | ICD-10-CM

## 2017-03-27 DIAGNOSIS — R269 Unspecified abnormalities of gait and mobility: Secondary | ICD-10-CM

## 2017-03-27 NOTE — Progress Notes (Signed)
Subjective: [name] is a 84 y.o. male patient seen today in office for POV #1 (DOS 03-18-17), S/P Left wound debridement and removal of sesamoid bones for chronic plantar diabetic foot ulceration. Patient denies pain at surgical site, denies calf pain, denies headache, chest pain, shortness of breath, nausea, vomiting, fever, or chills. No other issues noted. States its doing good with no complaints.  FBS not checked today  Patient Active Problem List   Diagnosis Date Noted  . Abnormal breath sounds 03/05/2017  . Chronic pain 02/05/2017  . Acute diastolic CHF (congestive heart failure) ([facility]) 12/04/2016  . S/P shoulder replacement, left 11/16/2016  . Preoperative clearance 10/15/2016  . Hyperlipidemia 10/15/2016  . Diabetes ([facility]) 03/10/2015  . Perennial and seasonal allergic rhinitis 12/20/2014  . Hematuria 12/20/2014  . Loss of weight 05/06/2014  . Other pancytopenia ([facility]) 05/05/2014  . CAP (community acquired pneumonia) 05/05/2014  . Hypocalcemia 05/05/2014  . Skin lesion 04/30/2014  . 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 Abdominal pain, right upper quadrant 05/04/2013  . Cold intolerance 05/04/2013  . Low back pain 04/03/2012  . Encounter for long-term (current) use of other medications 12/11/2011  . Routine general medical examination at a health care facility 12/17/2010  . Screening for prostate cancer 12/12/2010  . Osteoarthrosis, unspecified whether generalized or localized, unspecified site 10/24/2010  . SKIN RASH 06/29/2009  . Iron deficiency anemia 03/10/2009  . BACK PAIN, LUMBAR 03/10/2009  . CHARCOT-MARIE-TOOTH DISEASE 05/03/2008  . DYSPNEA 05/03/2008  . DEPRESSIVE DISORDER NOT ELSEWHERE CLASSIFIED 05/22/2007  . CHEST PAIN 05/22/2007  . ANEMIA, PERNICIOUS 12/02/2006  . Essential hypertension 07/26/2006  . Coronary atherosclerosis 07/26/2006  . GERD 07/26/2006    Current Outpatient Medications on File Prior to Visit  Medication Sig Dispense  Refill  . ACCU-CHEK SOFTCLIX LANCETS lancets Used to check blood sugars daily. 100 each 12  . acetaminophen (TYLENOL) 500 MG tablet Take 1,000-1,500 mg 2 (two) times daily as needed by mouth for moderate pain.    . Alcohol Swabs (B-D SINGLE USE SWABS REGULAR) PADS Used to check blood sugars daily. 100 each 11  . Ascorbic Acid (VITAMIN C) 500 MG tablet Take 500 mg by mouth 2 (two) times daily.     Marland Kitchen aspirin (GOODSENSE ASPIRIN) 325 MG tablet Take 325 mg daily by mouth.     . Blood Glucose Calibration (ACCU-CHEK AVIVA) SOLN 1 Bottle by In Vitro route as needed. 1 each 0  . Blood Glucose Monitoring Suppl (ACCU-CHEK AVIVA) device Use as instructed 1 each 0  . cholecalciferol (VITAMIN D) 1000 units tablet Take 1,000 Units at bedtime by mouth.    . Cyanocobalamin (B-12 PO) Take 1 tablet daily by mouth.    . Cyanocobalamin (VITAMIN B-12 IJ) GET A B12 INJECTION ONCE EVERY MONTH     . docusate sodium (COLACE) 100 MG capsule Take 1 capsule (100 mg total) by mouth daily as needed. 30 capsule 2  . ferrous sulfate 325 (65 FE) MG tablet Take 325 mg 2 (two) times daily by mouth.    . gabapentin (NEURONTIN) 300 MG capsule Take 2 capsules (600 mg total) by mouth at bedtime. 180 capsule 3  . glucose blood (ACCU-CHEK AVIVA) test strip Used to check blood sugars daily. 100 each 12  . Inositol Niacinate (NIACIN FLUSH FREE) 500 MG CAPS Take 500 mg at bedtime by mouth.     Demetra Shiner Devices (SIMPLE DIAGNOSTICS LANCING DEV) MISC   1  . metFORMIN (GLUCOPHAGE-XR) 500 MG 24 hr tablet  Take 500 mg by mouth 2 (two) times daily.    . montelukast (SINGULAIR) 10 MG tablet TAKE 1 TABLET BY MOUTH AT  BEDTIME 90 tablet 3  . Multiple Vitamins-Minerals (MULTIVITAMIN WITH MINERALS) tablet Take 1 tablet by mouth daily.      . promethazine (PHENERGAN) 12.5 MG tablet Take 1 tablet (12.5 mg total) by mouth every 6 (six) hours as needed for nausea or vomiting. 30 tablet 0  . sertraline (ZOLOFT) 50 MG tablet TAKE 1 TABLET BY MOUTH  DAILY 90  tablet 3  . simvastatin (ZOCOR) 80 MG tablet Take 1 tablet (80 mg total) by mouth at bedtime. 90 tablet 3  . traMADol (ULTRAM) 50 MG tablet Take 1 tablet (50 mg total) by mouth every 12 (twelve) hours as needed. 20 tablet 0  . Zinc 50 MG CAPS Take 50 mg at bedtime by mouth.      No current facility-administered medications on file prior to visit.     No Known Allergies  Objective: There were no vitals filed for this visit.  General: No acute distress, AAOx3  Left foot: Sutures intact with no gapping or dehiscence at surgical site, minimal swelling to left foot, no erythema, no warmth, no drainage, no signs of infection noted, Capillary fill time <3 seconds in all digits, gross sensation present via light touch to left foot. No pain or crepitation with range of motion left foot.  No pain with calf compression.   Post Op Xray, Left foot: consistent with sesamoidectomy. Soft tissue swelling within normal limits for post op status.   Assessment and Plan:  Problem List Items Addressed This Visit      Nervous and Auditory   CHARCOT-MARIE-TOOTH DISEASE    Other Visit Diagnoses    Foot ulcer, left, limited to breakdown of skin ([facility])    -  Primary   Relevant Orders   DG Foot Complete Left   S/P foot surgery, left       Prominent metatarsal head, unspecified laterality       Type 2 diabetes, controlled, with neuropathy ([facility])       Pes cavus, congenital       Abnormality of gait       PVD (peripheral vascular disease) ([facility])          -Patient seen and evaluated -Xrays reviewed  -Applied adaptic and dry sterile dressing to surgical site left foot secured with stockinet  -Advised patient to make sure to keep dressings clean, dry, and intact to left allowing nursing to change; Nurse can resume on Friday -Advised patient to continue with CAM boot and wheelchair to keep pressure off surgical site -Advised patient to limit activity to necessity  -Advised patient to ice and elevate as  necessary  -Will plan for incision check at next office visit; will wait and remove sutures at visit #3. In the meantime, patient to call office if any issues or problems arise.   Asencion Islamitorya Murad Staples, DPM

## 2017-03-27 NOTE — Telephone Encounter (Signed)
Yes that's fine. -Dr. SKathie Rhodes

## 2017-03-27 NOTE — Telephone Encounter (Signed)
[name] - [facility] states pt was seen today by Dr. [name] for dressing changes, his dressing changes were schedule twice weekly, would it be okay to have next change on Monday.

## 2017-03-27 NOTE — Telephone Encounter (Signed)
I informed [name] - [name] [name] stated the dressing could be changed on Monday.

## 2017-03-28 ENCOUNTER — Telehealth: Payer: Self-pay | Admitting: *Deleted

## 2017-03-28 NOTE — Telephone Encounter (Signed)
-----   Message from Asencion Islamitorya Stover, [place] sent at 03/27/2017  2:00 PM EDT ----- Regarding: Wound care Continue with adaptic and steristrips to the surgical site covered with dry dressing. Nurse does not need to see patient tomorrow. Can check patient on Friday. Thanks Dr. [name]

## 2017-03-28 NOTE — Telephone Encounter (Signed)
Faxed Dr. [name] 03/27/2017 [facility]

## 2017-03-29 ENCOUNTER — Telehealth: Payer: Self-pay | Admitting: Sports Medicine

## 2017-03-29 ENCOUNTER — Telehealth: Payer: Self-pay | Admitting: *Deleted

## 2017-03-29 NOTE — Telephone Encounter (Signed)
[name] talked with home nurse and she will be out on Monday. The dressings will be ok to stay in place until monday

## 2017-03-29 NOTE — Telephone Encounter (Signed)
Dr. Stover please advise 

## 2017-03-29 NOTE — Telephone Encounter (Signed)
Called and spoke with Mr David CostaBible is said he was Molli KnockOkay if Dr [name] said it was Molli KnockOkay he just doesn't want anything to happen.  I told him everything should be fine with her changing it on Monday but if he has any concerns to call the on call doctor.  He said he would.

## 2017-03-29 NOTE — Telephone Encounter (Signed)
Homero FellersFrank Henault DOB Aug 07, 1931,Elaine Fredric MareBailey called  to find out if Nurse needs to come out today and if not Why? Because Dr. [name] told them differently. Pt can be reached on [phone] Margarita Ranalaine Bailey called

## 2017-04-01 ENCOUNTER — Telehealth: Payer: Self-pay | Admitting: Sports Medicine

## 2017-04-01 DIAGNOSIS — Q667 Congenital pes cavus: Secondary | ICD-10-CM | POA: Diagnosis not present

## 2017-04-01 DIAGNOSIS — I5032 Chronic diastolic (congestive) heart failure: Secondary | ICD-10-CM | POA: Diagnosis not present

## 2017-04-01 DIAGNOSIS — E11621 Type 2 diabetes mellitus with foot ulcer: Secondary | ICD-10-CM | POA: Diagnosis not present

## 2017-04-01 DIAGNOSIS — E1151 Type 2 diabetes mellitus with diabetic peripheral angiopathy without gangrene: Secondary | ICD-10-CM | POA: Diagnosis not present

## 2017-04-01 DIAGNOSIS — L97419 Non-pressure chronic ulcer of right heel and midfoot with unspecified severity: Secondary | ICD-10-CM | POA: Diagnosis not present

## 2017-04-01 DIAGNOSIS — Z4800 Encounter for change or removal of nonsurgical wound dressing: Secondary | ICD-10-CM | POA: Diagnosis not present

## 2017-04-01 DIAGNOSIS — E1140 Type 2 diabetes mellitus with diabetic neuropathy, unspecified: Secondary | ICD-10-CM | POA: Diagnosis not present

## 2017-04-01 DIAGNOSIS — I2510 Atherosclerotic heart disease of native coronary artery without angina pectoris: Secondary | ICD-10-CM | POA: Diagnosis not present

## 2017-04-01 DIAGNOSIS — I110 Hypertensive heart disease with heart failure: Secondary | ICD-10-CM | POA: Diagnosis not present

## 2017-04-01 NOTE — Telephone Encounter (Signed)
This is [name], [name] with [facility]. I'm calling to let Dr. [name] know that Mr. [name] fell yesterday getting out of bed. He proceeded to crawl his way into the living room to get into his chair to get up. He has road rash on his knees, he has a small abrasion to his left outer ankle, but he has two abrasions to the right great toe and the second toe. Apparently they bled pretty profusely yesterday. I did look at them when I was there today. They had no drainage on them today. He knows he's coming in Wednesday. He is having pain in left ankle but said he would have her look at it when he comes in on Wednesday. If you have any questions, feel free to give me a call at [phone].

## 2017-04-01 NOTE — Telephone Encounter (Signed)
Ok thank you. I will examine when he comes to office for his appointment this week Thanks Dr. [name]

## 2017-04-02 DIAGNOSIS — E119 Type 2 diabetes mellitus without complications: Secondary | ICD-10-CM | POA: Diagnosis not present

## 2017-04-02 NOTE — Telephone Encounter (Signed)
[facility] states an Software engineerout-dated insurance card was sent with pt's medical supply order form, and she would like copy of the new card. I asked that she send a copy of the order form to me, because I did not see orders in the notes. Autumn emailed copy of the order form. I faxed and called Autumn to let the card was being faxed.

## 2017-04-03 ENCOUNTER — Encounter: Payer: Self-pay | Admitting: Sports Medicine

## 2017-04-03 ENCOUNTER — Ambulatory Visit (INDEPENDENT_AMBULATORY_CARE_PROVIDER_SITE_OTHER): Payer: Medicare HMO | Admitting: Sports Medicine

## 2017-04-03 ENCOUNTER — Telehealth: Payer: Self-pay | Admitting: *Deleted

## 2017-04-03 DIAGNOSIS — T07XXXA Unspecified multiple injuries, initial encounter: Secondary | ICD-10-CM

## 2017-04-03 DIAGNOSIS — G600 Hereditary motor and sensory neuropathy: Secondary | ICD-10-CM

## 2017-04-03 DIAGNOSIS — I739 Peripheral vascular disease, unspecified: Secondary | ICD-10-CM

## 2017-04-03 DIAGNOSIS — Z9889 Other specified postprocedural states: Secondary | ICD-10-CM

## 2017-04-03 DIAGNOSIS — E1140 Type 2 diabetes mellitus with diabetic neuropathy, unspecified: Secondary | ICD-10-CM

## 2017-04-03 DIAGNOSIS — L03115 Cellulitis of right lower limb: Secondary | ICD-10-CM

## 2017-04-03 MED ORDER — SULFAMETHOXAZOLE-TRIMETHOPRIM 800-160 MG PO TABS: 1.0000 | ORAL_TABLET | Freq: Two times a day (BID) | ORAL | 0 refills | Status: DC

## 2017-04-03 MED ORDER — GABAPENTIN 300 MG PO CAPS: 600.0000 mg | ORAL_CAPSULE | Freq: Every day | ORAL | 3 refills | Status: AC

## 2017-04-03 NOTE — Telephone Encounter (Signed)
-----   Message from Henryitorya Stover, [place] Dakota[place]PM sent at 04/03/2017  5:21 PM EDT ----- Regarding: Home wound care orders To left foot plantar surgical site apply Adaptic and Steri-Strips, to abrasion at left ankle and to right toes applied a small amount of Iodosorb of which the patient has at home covered with dry dressing.  Patient should receive wound care twice a week by nursing company and will also see me in office once weekly for continued postoperative care.  Please encourage patient to elevate legs to assist with edema control during the day. -Dr. [name]

## 2017-04-03 NOTE — Progress Notes (Signed)
Subjective: [name] is a 84 y.o. male patient seen today in office for POV #2 (DOS 03-18-17), S/P Left wound debridement and removal of sesamoid bones for chronic plantar diabetic foot ulceration. Patient denies pain at surgical site, however does admit a new issue of falling 4 days ago and scraping the left ankle and the right toes, patient admits to a lot of bleeding however denies increased pain to affected areas, denies calf pain, denies headache, denies chest pain, shortness of breath, nausea, vomiting, fever, or chills. No other issues noted.   FBS not checked today  Patient Active Problem List   Diagnosis Date Noted  . Abnormal breath sounds 03/05/2017  . Chronic pain 02/05/2017  . Acute diastolic CHF (congestive heart failure) (HCC) 12/04/2016  . S/P shoulder replacement, left 11/16/2016  . Preoperative clearance 10/15/2016  . Hyperlipidemia 10/15/2016  . Diabetes (HCC) 03/10/2015  . Perennial and seasonal allergic rhinitis 12/20/2014  . Hematuria 12/20/2014  . Loss of weight 05/06/2014  . Other pancytopenia (HCC) 05/05/2014  . CAP (community acquired pneumonia) 05/05/2014  . Hypocalcemia 05/05/2014  . Skin lesion 04/30/2014  . 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 Abdominal pain, right upper quadrant 05/04/2013  . Cold intolerance 05/04/2013  . Low back pain 04/03/2012  . Encounter for long-term (current) use of other medications 12/11/2011  . Routine general medical examination at a health care facility 12/17/2010  . Screening for prostate cancer 12/12/2010  . Osteoarthrosis, unspecified whether generalized or localized, unspecified site 10/24/2010  . SKIN RASH 06/29/2009  . Iron deficiency anemia 03/10/2009  . BACK PAIN, LUMBAR 03/10/2009  . CHARCOT-MARIE-TOOTH DISEASE 05/03/2008  . DYSPNEA 05/03/2008  . DEPRESSIVE DISORDER NOT ELSEWHERE CLASSIFIED 05/22/2007  . CHEST PAIN 05/22/2007  . ANEMIA, PERNICIOUS 12/02/2006  . Essential hypertension 07/26/2006   . Coronary atherosclerosis 07/26/2006  . GERD 07/26/2006    Current Outpatient Medications on File Prior to Visit  Medication Sig Dispense Refill  . ACCU-CHEK SOFTCLIX LANCETS lancets Used to check blood sugars daily. 100 each 12  . acetaminophen (TYLENOL) 500 MG tablet Take 1,000-1,500 mg 2 (two) times daily as needed by mouth for moderate pain.    . Alcohol Swabs (B-D SINGLE USE SWABS REGULAR) PADS Used to check blood sugars daily. 100 each 11  . Ascorbic Acid (VITAMIN C) 500 MG tablet Take 500 mg by mouth 2 (two) times daily.     Marland Kitchen aspirin (GOODSENSE ASPIRIN) 325 MG tablet Take 325 mg daily by mouth.     . Blood Glucose Calibration (ACCU-CHEK AVIVA) SOLN 1 Bottle by In Vitro route as needed. 1 each 0  . Blood Glucose Monitoring Suppl (ACCU-CHEK AVIVA) device Use as instructed 1 each 0  . cholecalciferol (VITAMIN D) 1000 units tablet Take 1,000 Units at bedtime by mouth.    . Cyanocobalamin (B-12 PO) Take 1 tablet daily by mouth.    . Cyanocobalamin (VITAMIN B-12 IJ) GET A B12 INJECTION ONCE EVERY MONTH     . docusate sodium (COLACE) 100 MG capsule Take 1 capsule (100 mg total) by mouth daily as needed. 30 capsule 2  . ferrous sulfate 325 (65 FE) MG tablet Take 325 mg 2 (two) times daily by mouth.    Marland Kitchen glucose blood (ACCU-CHEK AVIVA) test strip Used to check blood sugars daily. 100 each 12  . Inositol Niacinate (NIACIN FLUSH FREE) 500 MG CAPS Take 500 mg at bedtime by mouth.     Demetra Shiner Devices (SIMPLE DIAGNOSTICS LANCING DEV) MISC   1  .  metFORMIN (GLUCOPHAGE-XR) 500 MG 24 hr tablet Take 500 mg by mouth 2 (two) times daily.    . montelukast (SINGULAIR) 10 MG tablet TAKE 1 TABLET BY MOUTH AT  BEDTIME 90 tablet 3  . Multiple Vitamins-Minerals (MULTIVITAMIN WITH MINERALS) tablet Take 1 tablet by mouth daily.      . promethazine (PHENERGAN) 12.5 MG tablet Take 1 tablet (12.5 mg total) by mouth every 6 (six) hours as needed for nausea or vomiting. 30 tablet 0  . sertraline (ZOLOFT) 50 MG  tablet TAKE 1 TABLET BY MOUTH  DAILY 90 tablet 3  . simvastatin (ZOCOR) 80 MG tablet Take 1 tablet (80 mg total) by mouth at bedtime. 90 tablet 3  . traMADol (ULTRAM) 50 MG tablet Take 1 tablet (50 mg total) by mouth every 12 (twelve) hours as needed. 20 tablet 0  . Zinc 50 MG CAPS Take 50 mg at bedtime by mouth.      No current facility-administered medications on file prior to visit.     No Known Allergies  Objective: There were no vitals filed for this visit.  General: No acute distress, AAOx3  Left foot: Abrasion at lateral ankle, sutures intact with no gapping or dehiscence at surgical site, minimal swelling to left foot, no erythema, no warmth, no drainage, no signs of infection noted, Capillary fill time <3 seconds in all digits, gross sensation present via light touch to left foot however protective sensation is absent. No pain or crepitation with range of motion left foot.  No pain with calf compression.   Right foot: Abrasion to right first through third toes with mild soft tissue swelling and redness possible dependent rubor versus cellulitis without warmth no active drainage, history of bleeding from areas capillary fill time is under 3 seconds with gross sensation present to the right foot however protective sensation is absent no pain with palpation to the right foot or lower calf.  Assessment and Plan:  Problem List Items Addressed This Visit      Nervous and Auditory   CHARCOT-MARIE-TOOTH DISEASE   Relevant Medications   gabapentin (NEURONTIN) 300 MG capsule    Other Visit Diagnoses    S/P foot surgery, left    -  Primary   Type 2 diabetes, controlled, with neuropathy (HCC)       Relevant Medications   gabapentin (NEURONTIN) 300 MG capsule   Multiple abrasions       Cellulitis of right leg       PVD (peripheral vascular disease) (HCC)          -Patient seen and evaluated -Discussed wound care -Applied adaptic and dry sterile dressing to surgical site and Iodosorb  to lateral ankle on left foot secured with stockinet -Applied Iodosorb and dry dressing covered with stockinette on right foot -Prescribed Bactrim for cautionary signs of rubor and redness on right foot -Advised patient to make sure to keep dressings clean, dry, and intact allowing nursing to change dressings as above; Nurse to see patient twice a week -Advised patient to continue with CAM boot and wheelchair to keep pressure off surgical site -Encouraged elevation when sitting to assist with edema control -Advised patient to limit activity to necessity  -Advised patient to ice and elevate as necessary  -Refilled gabapentin -Will plan for incision check at next office visit; will wait and remove sutures at next visit. In the meantime, patient to call office if any issues or problems arise.   Asencion Islamitorya Maile Linford, DPM

## 2017-04-03 NOTE — Telephone Encounter (Signed)
[facility].

## 2017-04-04 DIAGNOSIS — E11621 Type 2 diabetes mellitus with foot ulcer: Secondary | ICD-10-CM | POA: Diagnosis not present

## 2017-04-04 DIAGNOSIS — I110 Hypertensive heart disease with heart failure: Secondary | ICD-10-CM | POA: Diagnosis not present

## 2017-04-04 DIAGNOSIS — E1151 Type 2 diabetes mellitus with diabetic peripheral angiopathy without gangrene: Secondary | ICD-10-CM | POA: Diagnosis not present

## 2017-04-04 DIAGNOSIS — L97419 Non-pressure chronic ulcer of right heel and midfoot with unspecified severity: Secondary | ICD-10-CM | POA: Diagnosis not present

## 2017-04-04 DIAGNOSIS — I2510 Atherosclerotic heart disease of native coronary artery without angina pectoris: Secondary | ICD-10-CM | POA: Diagnosis not present

## 2017-04-04 DIAGNOSIS — Z4800 Encounter for change or removal of nonsurgical wound dressing: Secondary | ICD-10-CM | POA: Diagnosis not present

## 2017-04-04 DIAGNOSIS — Q667 Congenital pes cavus: Secondary | ICD-10-CM | POA: Diagnosis not present

## 2017-04-04 DIAGNOSIS — E1140 Type 2 diabetes mellitus with diabetic neuropathy, unspecified: Secondary | ICD-10-CM | POA: Diagnosis not present

## 2017-04-04 DIAGNOSIS — I5032 Chronic diastolic (congestive) heart failure: Secondary | ICD-10-CM | POA: Diagnosis not present

## 2017-04-05 DIAGNOSIS — J9610 Chronic respiratory failure, unspecified whether with hypoxia or hypercapnia: Secondary | ICD-10-CM | POA: Diagnosis not present

## 2017-04-05 DIAGNOSIS — I110 Hypertensive heart disease with heart failure: Secondary | ICD-10-CM | POA: Diagnosis not present

## 2017-04-05 DIAGNOSIS — E1140 Type 2 diabetes mellitus with diabetic neuropathy, unspecified: Secondary | ICD-10-CM | POA: Diagnosis not present

## 2017-04-05 DIAGNOSIS — G600 Hereditary motor and sensory neuropathy: Secondary | ICD-10-CM | POA: Diagnosis not present

## 2017-04-05 DIAGNOSIS — E1151 Type 2 diabetes mellitus with diabetic peripheral angiopathy without gangrene: Secondary | ICD-10-CM | POA: Diagnosis not present

## 2017-04-05 DIAGNOSIS — I5032 Chronic diastolic (congestive) heart failure: Secondary | ICD-10-CM | POA: Diagnosis not present

## 2017-04-05 DIAGNOSIS — Z4800 Encounter for change or removal of nonsurgical wound dressing: Secondary | ICD-10-CM | POA: Diagnosis not present

## 2017-04-05 DIAGNOSIS — L97419 Non-pressure chronic ulcer of right heel and midfoot with unspecified severity: Secondary | ICD-10-CM | POA: Diagnosis not present

## 2017-04-05 DIAGNOSIS — E11621 Type 2 diabetes mellitus with foot ulcer: Secondary | ICD-10-CM | POA: Diagnosis not present

## 2017-04-05 DIAGNOSIS — I2510 Atherosclerotic heart disease of native coronary artery without angina pectoris: Secondary | ICD-10-CM | POA: Diagnosis not present

## 2017-04-05 DIAGNOSIS — Q667 Congenital pes cavus: Secondary | ICD-10-CM | POA: Diagnosis not present

## 2017-04-08 DIAGNOSIS — E1140 Type 2 diabetes mellitus with diabetic neuropathy, unspecified: Secondary | ICD-10-CM | POA: Diagnosis not present

## 2017-04-08 DIAGNOSIS — I2510 Atherosclerotic heart disease of native coronary artery without angina pectoris: Secondary | ICD-10-CM | POA: Diagnosis not present

## 2017-04-08 DIAGNOSIS — E11621 Type 2 diabetes mellitus with foot ulcer: Secondary | ICD-10-CM | POA: Diagnosis not present

## 2017-04-08 DIAGNOSIS — Q667 Congenital pes cavus: Secondary | ICD-10-CM | POA: Diagnosis not present

## 2017-04-08 DIAGNOSIS — I5032 Chronic diastolic (congestive) heart failure: Secondary | ICD-10-CM | POA: Diagnosis not present

## 2017-04-08 DIAGNOSIS — L97419 Non-pressure chronic ulcer of right heel and midfoot with unspecified severity: Secondary | ICD-10-CM | POA: Diagnosis not present

## 2017-04-08 DIAGNOSIS — Z4800 Encounter for change or removal of nonsurgical wound dressing: Secondary | ICD-10-CM | POA: Diagnosis not present

## 2017-04-08 DIAGNOSIS — I110 Hypertensive heart disease with heart failure: Secondary | ICD-10-CM | POA: Diagnosis not present

## 2017-04-08 DIAGNOSIS — E1151 Type 2 diabetes mellitus with diabetic peripheral angiopathy without gangrene: Secondary | ICD-10-CM | POA: Diagnosis not present

## 2017-04-09 DIAGNOSIS — Z4800 Encounter for change or removal of nonsurgical wound dressing: Secondary | ICD-10-CM | POA: Diagnosis not present

## 2017-04-09 DIAGNOSIS — E1151 Type 2 diabetes mellitus with diabetic peripheral angiopathy without gangrene: Secondary | ICD-10-CM | POA: Diagnosis not present

## 2017-04-09 DIAGNOSIS — L97511 Non-pressure chronic ulcer of other part of right foot limited to breakdown of skin: Secondary | ICD-10-CM | POA: Diagnosis not present

## 2017-04-09 DIAGNOSIS — E1140 Type 2 diabetes mellitus with diabetic neuropathy, unspecified: Secondary | ICD-10-CM | POA: Diagnosis not present

## 2017-04-09 DIAGNOSIS — I5032 Chronic diastolic (congestive) heart failure: Secondary | ICD-10-CM | POA: Diagnosis not present

## 2017-04-09 DIAGNOSIS — L97419 Non-pressure chronic ulcer of right heel and midfoot with unspecified severity: Secondary | ICD-10-CM | POA: Diagnosis not present

## 2017-04-09 DIAGNOSIS — E11621 Type 2 diabetes mellitus with foot ulcer: Secondary | ICD-10-CM | POA: Diagnosis not present

## 2017-04-09 DIAGNOSIS — I2510 Atherosclerotic heart disease of native coronary artery without angina pectoris: Secondary | ICD-10-CM | POA: Diagnosis not present

## 2017-04-09 DIAGNOSIS — Q667 Congenital pes cavus: Secondary | ICD-10-CM | POA: Diagnosis not present

## 2017-04-09 DIAGNOSIS — I110 Hypertensive heart disease with heart failure: Secondary | ICD-10-CM | POA: Diagnosis not present

## 2017-04-10 ENCOUNTER — Other Ambulatory Visit: Payer: Self-pay | Admitting: Endocrinology

## 2017-04-10 ENCOUNTER — Encounter: Payer: Self-pay | Admitting: Sports Medicine

## 2017-04-10 ENCOUNTER — Ambulatory Visit (INDEPENDENT_AMBULATORY_CARE_PROVIDER_SITE_OTHER): Payer: Medicare HMO | Admitting: Sports Medicine

## 2017-04-10 ENCOUNTER — Telehealth: Payer: Self-pay | Admitting: *Deleted

## 2017-04-10 DIAGNOSIS — G600 Hereditary motor and sensory neuropathy: Secondary | ICD-10-CM

## 2017-04-10 DIAGNOSIS — E1140 Type 2 diabetes mellitus with diabetic neuropathy, unspecified: Secondary | ICD-10-CM

## 2017-04-10 DIAGNOSIS — I739 Peripheral vascular disease, unspecified: Secondary | ICD-10-CM

## 2017-04-10 DIAGNOSIS — Z9889 Other specified postprocedural states: Secondary | ICD-10-CM

## 2017-04-10 DIAGNOSIS — T07XXXA Unspecified multiple injuries, initial encounter: Secondary | ICD-10-CM

## 2017-04-10 NOTE — Progress Notes (Signed)
Subjective: [name] is a 84 y.o. male patient seen today in office for POV #3 (DOS 03-18-17), S/P Left wound debridement and removal of sesamoid bones for chronic plantar diabetic foot ulceration. Patient denies pain at surgical site.  States that he is doing much better no repeat falls since last visit.  Still taking Bactrim with 1 week left.  Denies calf pain, denies headache, denies chest pain, shortness of breath, nausea, vomiting, fever, or chills. No other issues noted.   FBS not checked today  Patient Active Problem List   Diagnosis Date Noted  . Abnormal breath sounds 03/05/2017  . Chronic pain 02/05/2017  . Acute diastolic CHF (congestive heart failure) (HCC) 12/04/2016  . S/P shoulder replacement, left 11/16/2016  . Preoperative clearance 10/15/2016  . Hyperlipidemia 10/15/2016  . Diabetes (HCC) 03/10/2015  . Perennial and seasonal allergic rhinitis 12/20/2014  . Hematuria 12/20/2014  . Loss of weight 05/06/2014  . Other pancytopenia (HCC) 05/05/2014  . CAP (community acquired pneumonia) 05/05/2014  . Hypocalcemia 05/05/2014  . Skin lesion 04/30/2014  . 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 Abdominal pain, right upper quadrant 05/04/2013  . Cold intolerance 05/04/2013  . Low back pain 04/03/2012  . Encounter for long-term (current) use of other medications 12/11/2011  . Routine general medical examination at a health care facility 12/17/2010  . Screening for prostate cancer 12/12/2010  . Osteoarthrosis, unspecified whether generalized or localized, unspecified site 10/24/2010  . SKIN RASH 06/29/2009  . Iron deficiency anemia 03/10/2009  . BACK PAIN, LUMBAR 03/10/2009  . CHARCOT-MARIE-TOOTH DISEASE 05/03/2008  . DYSPNEA 05/03/2008  . DEPRESSIVE DISORDER NOT ELSEWHERE CLASSIFIED 05/22/2007  . CHEST PAIN 05/22/2007  . ANEMIA, PERNICIOUS 12/02/2006  . Essential hypertension 07/26/2006  . Coronary atherosclerosis 07/26/2006  . GERD 07/26/2006    Current  Outpatient Medications on File Prior to Visit  Medication Sig Dispense Refill  . ACCU-CHEK SOFTCLIX LANCETS lancets Used to check blood sugars daily. 100 each 12  . acetaminophen (TYLENOL) 500 MG tablet Take 1,000-1,500 mg 2 (two) times daily as needed by mouth for moderate pain.    . Alcohol Swabs (B-D SINGLE USE SWABS REGULAR) PADS Used to check blood sugars daily. 100 each 11  . Ascorbic Acid (VITAMIN C) 500 MG tablet Take 500 mg by mouth 2 (two) times daily.     Marland Kitchen aspirin (GOODSENSE ASPIRIN) 325 MG tablet Take 325 mg daily by mouth.     . Blood Glucose Calibration (ACCU-CHEK AVIVA) SOLN 1 Bottle by In Vitro route as needed. 1 each 0  . Blood Glucose Monitoring Suppl (ACCU-CHEK AVIVA) device Use as instructed 1 each 0  . cholecalciferol (VITAMIN D) 1000 units tablet Take 1,000 Units at bedtime by mouth.    . Cyanocobalamin (B-12 PO) Take 1 tablet daily by mouth.    . Cyanocobalamin (VITAMIN B-12 IJ) GET A B12 INJECTION ONCE EVERY MONTH     . docusate sodium (COLACE) 100 MG capsule Take 1 capsule (100 mg total) by mouth daily as needed. 30 capsule 2  . ferrous sulfate 325 (65 FE) MG tablet Take 325 mg 2 (two) times daily by mouth.    . gabapentin (NEURONTIN) 300 MG capsule Take 2 capsules (600 mg total) by mouth at bedtime. 180 capsule 3  . glucose blood (ACCU-CHEK AVIVA) test strip Used to check blood sugars daily. 100 each 12  . Inositol Niacinate (NIACIN FLUSH FREE) 500 MG CAPS Take 500 mg at bedtime by mouth.     Demetra Shiner  Devices (SIMPLE DIAGNOSTICS LANCING DEV) MISC   1  . metFORMIN (GLUCOPHAGE-XR) 500 MG 24 hr tablet Take 500 mg by mouth 2 (two) times daily.    . montelukast (SINGULAIR) 10 MG tablet TAKE 1 TABLET BY MOUTH AT  BEDTIME 90 tablet 3  . Multiple Vitamins-Minerals (MULTIVITAMIN WITH MINERALS) tablet Take 1 tablet by mouth daily.      . promethazine (PHENERGAN) 12.5 MG tablet Take 1 tablet (12.5 mg total) by mouth every 6 (six) hours as needed for nausea or vomiting. 30 tablet  0  . sertraline (ZOLOFT) 50 MG tablet TAKE 1 TABLET BY MOUTH  DAILY 90 tablet 3  . simvastatin (ZOCOR) 80 MG tablet Take 1 tablet (80 mg total) by mouth at bedtime. 90 tablet 3  . sulfamethoxazole-trimethoprim (BACTRIM DS,SEPTRA DS) 800-160 MG tablet Take 1 tablet by mouth 2 (two) times daily. 28 tablet 0  . traMADol (ULTRAM) 50 MG tablet Take 1 tablet (50 mg total) by mouth every 12 (twelve) hours as needed. 20 tablet 0  . Zinc 50 MG CAPS Take 50 mg at bedtime by mouth.      No current facility-administered medications on file prior to visit.     No Known Allergies  Objective: There were no vitals filed for this visit.  General: No acute distress, AAOx3  Left foot: Abrasion at lateral ankle, sutures intact with no gapping or dehiscence at surgical site, mild swelling to left foot, no erythema, no warmth, no drainage, no signs of infection noted, Capillary fill time <3 seconds in all digits, gross sensation present via light touch to left foot however protective sensation is absent. No pain or crepitation with range of motion left foot.  No pain with calf compression.   Right foot: Abrasion to right first through third toes with mild soft tissue swelling and dependent rubor without warmth no active drainage, capillary fill time is under 3 seconds with gross sensation present to the right foot however protective sensation is absent no pain with palpation to the right foot or lower calf.  Assessment and Plan:  Problem List Items Addressed This Visit      Nervous and Auditory   CHARCOT-MARIE-TOOTH DISEASE    Other Visit Diagnoses    S/P foot surgery, left    -  Primary   Type 2 diabetes, controlled, with neuropathy (HCC)       Multiple abrasions       PVD (peripheral vascular disease) (HCC)          -Patient seen and evaluated -Discussed wound care -Sutures removed from plantar aspect of the left foot and Steri-Strips applied -Applied Iodosorb and cover with compression stocking  next bilateral to assist with edema control -Encourage patient to elevate legs to assist with edema control -Continue with Bactrim until completed -Continue with nursing care twice weekly applying Iodosorb to all abrasions (left lateral ankle and right first through third toes) and to reapply Steri-Strips as needed to the left surgical site all covered with surgitube compression stockinettes -Advised patient to continue with CAM boot and may now walk  -Advised patient to limit activity to necessity  -Advised patient to ice and elevate as necessary  -Continue with gabapentin -Will plan for wound check and transition to normal shoe on left at next office visit. In the meantime, patient to call office if any issues or problems arise.   Asencion Islamitorya Aadhira Heffernan, DPM

## 2017-04-10 NOTE — Telephone Encounter (Signed)
-----   Message from [place] Villageitorya Stover, [place] sent at 04/10/2017 11:38 AM EDT ----- Regarding: Home nursing wound care nursing care twice weekly applying Iodosorb to all abrasions (left lateral ankle and right first through third toes) and to reapply Steri-Strips as needed to the left surgical site all covered with surgitube compression stockinettes

## 2017-04-10 NOTE — Telephone Encounter (Signed)
Faxed copy of Dr. Wynema BirchStover's 04/10/2017 11:38am [facility].

## 2017-04-11 DIAGNOSIS — Q667 Congenital pes cavus: Secondary | ICD-10-CM | POA: Diagnosis not present

## 2017-04-11 DIAGNOSIS — E11621 Type 2 diabetes mellitus with foot ulcer: Secondary | ICD-10-CM | POA: Diagnosis not present

## 2017-04-11 DIAGNOSIS — I110 Hypertensive heart disease with heart failure: Secondary | ICD-10-CM | POA: Diagnosis not present

## 2017-04-11 DIAGNOSIS — I5032 Chronic diastolic (congestive) heart failure: Secondary | ICD-10-CM | POA: Diagnosis not present

## 2017-04-11 DIAGNOSIS — E1151 Type 2 diabetes mellitus with diabetic peripheral angiopathy without gangrene: Secondary | ICD-10-CM | POA: Diagnosis not present

## 2017-04-11 DIAGNOSIS — E1140 Type 2 diabetes mellitus with diabetic neuropathy, unspecified: Secondary | ICD-10-CM | POA: Diagnosis not present

## 2017-04-11 DIAGNOSIS — I2510 Atherosclerotic heart disease of native coronary artery without angina pectoris: Secondary | ICD-10-CM | POA: Diagnosis not present

## 2017-04-11 DIAGNOSIS — L97419 Non-pressure chronic ulcer of right heel and midfoot with unspecified severity: Secondary | ICD-10-CM | POA: Diagnosis not present

## 2017-04-11 DIAGNOSIS — Z4800 Encounter for change or removal of nonsurgical wound dressing: Secondary | ICD-10-CM | POA: Diagnosis not present

## 2017-04-12 ENCOUNTER — Encounter: Payer: Self-pay | Admitting: Endocrinology

## 2017-04-12 DIAGNOSIS — Z4800 Encounter for change or removal of nonsurgical wound dressing: Secondary | ICD-10-CM | POA: Diagnosis not present

## 2017-04-12 DIAGNOSIS — I110 Hypertensive heart disease with heart failure: Secondary | ICD-10-CM | POA: Diagnosis not present

## 2017-04-12 DIAGNOSIS — Q667 Congenital pes cavus: Secondary | ICD-10-CM | POA: Diagnosis not present

## 2017-04-12 DIAGNOSIS — I2510 Atherosclerotic heart disease of native coronary artery without angina pectoris: Secondary | ICD-10-CM | POA: Diagnosis not present

## 2017-04-12 DIAGNOSIS — L97419 Non-pressure chronic ulcer of right heel and midfoot with unspecified severity: Secondary | ICD-10-CM | POA: Diagnosis not present

## 2017-04-12 DIAGNOSIS — I5032 Chronic diastolic (congestive) heart failure: Secondary | ICD-10-CM | POA: Diagnosis not present

## 2017-04-12 DIAGNOSIS — E11621 Type 2 diabetes mellitus with foot ulcer: Secondary | ICD-10-CM | POA: Diagnosis not present

## 2017-04-12 DIAGNOSIS — E1140 Type 2 diabetes mellitus with diabetic neuropathy, unspecified: Secondary | ICD-10-CM | POA: Diagnosis not present

## 2017-04-12 DIAGNOSIS — E1151 Type 2 diabetes mellitus with diabetic peripheral angiopathy without gangrene: Secondary | ICD-10-CM | POA: Diagnosis not present

## 2017-04-16 DIAGNOSIS — E1165 Type 2 diabetes mellitus with hyperglycemia: Secondary | ICD-10-CM | POA: Diagnosis not present

## 2017-04-16 DIAGNOSIS — E1140 Type 2 diabetes mellitus with diabetic neuropathy, unspecified: Secondary | ICD-10-CM | POA: Diagnosis not present

## 2017-04-16 DIAGNOSIS — Z0189 Encounter for other specified special examinations: Secondary | ICD-10-CM | POA: Diagnosis not present

## 2017-04-16 DIAGNOSIS — I5032 Chronic diastolic (congestive) heart failure: Secondary | ICD-10-CM | POA: Diagnosis not present

## 2017-04-16 DIAGNOSIS — E11621 Type 2 diabetes mellitus with foot ulcer: Secondary | ICD-10-CM | POA: Diagnosis not present

## 2017-04-16 DIAGNOSIS — E782 Mixed hyperlipidemia: Secondary | ICD-10-CM | POA: Diagnosis not present

## 2017-04-16 DIAGNOSIS — Z4800 Encounter for change or removal of nonsurgical wound dressing: Secondary | ICD-10-CM | POA: Diagnosis not present

## 2017-04-16 DIAGNOSIS — I110 Hypertensive heart disease with heart failure: Secondary | ICD-10-CM | POA: Diagnosis not present

## 2017-04-16 DIAGNOSIS — Q667 Congenital pes cavus: Secondary | ICD-10-CM | POA: Diagnosis not present

## 2017-04-16 DIAGNOSIS — I2510 Atherosclerotic heart disease of native coronary artery without angina pectoris: Secondary | ICD-10-CM | POA: Diagnosis not present

## 2017-04-16 DIAGNOSIS — Z130 Encounter for screening for diseases of the blood and blood-forming organs and certain disorders involving the immune mechanism: Secondary | ICD-10-CM | POA: Diagnosis not present

## 2017-04-16 DIAGNOSIS — L97419 Non-pressure chronic ulcer of right heel and midfoot with unspecified severity: Secondary | ICD-10-CM | POA: Diagnosis not present

## 2017-04-16 DIAGNOSIS — E119 Type 2 diabetes mellitus without complications: Secondary | ICD-10-CM | POA: Diagnosis not present

## 2017-04-16 DIAGNOSIS — E1151 Type 2 diabetes mellitus with diabetic peripheral angiopathy without gangrene: Secondary | ICD-10-CM | POA: Diagnosis not present

## 2017-04-17 ENCOUNTER — Telehealth: Payer: Self-pay | Admitting: Endocrinology

## 2017-04-17 NOTE — Telephone Encounter (Signed)
[facility] by Romero BellingSean Ellison MD , effective April 12, 2017. Dismissal letter sent out by certified / registered mail.  daj

## 2017-04-18 DIAGNOSIS — E11621 Type 2 diabetes mellitus with foot ulcer: Secondary | ICD-10-CM | POA: Diagnosis not present

## 2017-04-18 DIAGNOSIS — L97419 Non-pressure chronic ulcer of right heel and midfoot with unspecified severity: Secondary | ICD-10-CM | POA: Diagnosis not present

## 2017-04-18 DIAGNOSIS — Q667 Congenital pes cavus: Secondary | ICD-10-CM | POA: Diagnosis not present

## 2017-04-18 DIAGNOSIS — Z4800 Encounter for change or removal of nonsurgical wound dressing: Secondary | ICD-10-CM | POA: Diagnosis not present

## 2017-04-18 DIAGNOSIS — E1151 Type 2 diabetes mellitus with diabetic peripheral angiopathy without gangrene: Secondary | ICD-10-CM | POA: Diagnosis not present

## 2017-04-18 DIAGNOSIS — I2510 Atherosclerotic heart disease of native coronary artery without angina pectoris: Secondary | ICD-10-CM | POA: Diagnosis not present

## 2017-04-18 DIAGNOSIS — I5032 Chronic diastolic (congestive) heart failure: Secondary | ICD-10-CM | POA: Diagnosis not present

## 2017-04-18 DIAGNOSIS — I110 Hypertensive heart disease with heart failure: Secondary | ICD-10-CM | POA: Diagnosis not present

## 2017-04-18 DIAGNOSIS — E1140 Type 2 diabetes mellitus with diabetic neuropathy, unspecified: Secondary | ICD-10-CM | POA: Diagnosis not present

## 2017-04-22 DIAGNOSIS — E11621 Type 2 diabetes mellitus with foot ulcer: Secondary | ICD-10-CM | POA: Diagnosis not present

## 2017-04-22 DIAGNOSIS — I5032 Chronic diastolic (congestive) heart failure: Secondary | ICD-10-CM | POA: Diagnosis not present

## 2017-04-22 DIAGNOSIS — E1140 Type 2 diabetes mellitus with diabetic neuropathy, unspecified: Secondary | ICD-10-CM | POA: Diagnosis not present

## 2017-04-22 DIAGNOSIS — Q667 Congenital pes cavus: Secondary | ICD-10-CM | POA: Diagnosis not present

## 2017-04-22 DIAGNOSIS — I110 Hypertensive heart disease with heart failure: Secondary | ICD-10-CM | POA: Diagnosis not present

## 2017-04-22 DIAGNOSIS — I2510 Atherosclerotic heart disease of native coronary artery without angina pectoris: Secondary | ICD-10-CM | POA: Diagnosis not present

## 2017-04-22 DIAGNOSIS — Z4800 Encounter for change or removal of nonsurgical wound dressing: Secondary | ICD-10-CM | POA: Diagnosis not present

## 2017-04-22 DIAGNOSIS — E1151 Type 2 diabetes mellitus with diabetic peripheral angiopathy without gangrene: Secondary | ICD-10-CM | POA: Diagnosis not present

## 2017-04-22 DIAGNOSIS — L97419 Non-pressure chronic ulcer of right heel and midfoot with unspecified severity: Secondary | ICD-10-CM | POA: Diagnosis not present

## 2017-04-23 NOTE — Telephone Encounter (Signed)
Received signed domestic return receipt verifying delivery of certified letter on April 19, 2017. Article number [id number] daj

## 2017-04-24 ENCOUNTER — Telehealth: Payer: Self-pay | Admitting: *Deleted

## 2017-04-24 ENCOUNTER — Encounter: Payer: Self-pay | Admitting: Sports Medicine

## 2017-04-24 ENCOUNTER — Ambulatory Visit (INDEPENDENT_AMBULATORY_CARE_PROVIDER_SITE_OTHER): Payer: Medicare HMO | Admitting: Sports Medicine

## 2017-04-24 DIAGNOSIS — G600 Hereditary motor and sensory neuropathy: Secondary | ICD-10-CM

## 2017-04-24 DIAGNOSIS — E1140 Type 2 diabetes mellitus with diabetic neuropathy, unspecified: Secondary | ICD-10-CM

## 2017-04-24 DIAGNOSIS — Z9889 Other specified postprocedural states: Secondary | ICD-10-CM

## 2017-04-24 DIAGNOSIS — T07XXXA Unspecified multiple injuries, initial encounter: Secondary | ICD-10-CM

## 2017-04-24 DIAGNOSIS — I739 Peripheral vascular disease, unspecified: Secondary | ICD-10-CM

## 2017-04-24 NOTE — Progress Notes (Signed)
Subjective: [name] is a 84 y.o. male patient seen today in office for POV #4 (DOS 03-18-17), S/P Left wound debridement and removal of sesamoid bones for chronic plantar diabetic foot ulceration. Patient denies pain at surgical site.  States that he is doing good with no pain admits a light amount of red drainage coming from left foot.  Reports that all of the abrasions have healed and he has finished his last dose of Bactrim antibiotic.  Denies calf pain, denies headache, denies chest pain, shortness of breath, nausea, vomiting, fever, or chills. No other issues noted.   FBS not checked today  Patient Active Problem List   Diagnosis Date Noted  . Abnormal breath sounds 03/05/2017  . Chronic pain 02/05/2017  . Acute diastolic CHF (congestive heart failure) (HCC) 12/04/2016  . S/P shoulder replacement, left 11/16/2016  . Preoperative clearance 10/15/2016  . Hyperlipidemia 10/15/2016  . Diabetes (HCC) 03/10/2015  . Perennial and seasonal allergic rhinitis 12/20/2014  . Hematuria 12/20/2014  . Loss of weight 05/06/2014  . Other pancytopenia (HCC) 05/05/2014  . CAP (community acquired pneumonia) 05/05/2014  . Hypocalcemia 05/05/2014  . Skin lesion 04/30/2014  . 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 Abdominal pain, right upper quadrant 05/04/2013  . Cold intolerance 05/04/2013  . Low back pain 04/03/2012  . Encounter for long-term (current) use of other medications 12/11/2011  . Routine general medical examination at a health care facility 12/17/2010  . Screening for prostate cancer 12/12/2010  . Osteoarthrosis, unspecified whether generalized or localized, unspecified site 10/24/2010  . SKIN RASH 06/29/2009  . Iron deficiency anemia 03/10/2009  . BACK PAIN, LUMBAR 03/10/2009  . CHARCOT-MARIE-TOOTH DISEASE 05/03/2008  . DYSPNEA 05/03/2008  . DEPRESSIVE DISORDER NOT ELSEWHERE CLASSIFIED 05/22/2007  . CHEST PAIN 05/22/2007  . ANEMIA, PERNICIOUS 12/02/2006  . Essential  hypertension 07/26/2006  . Coronary atherosclerosis 07/26/2006  . GERD 07/26/2006    Current Outpatient Medications on File Prior to Visit  Medication Sig Dispense Refill  . ACCU-CHEK SOFTCLIX LANCETS lancets Used to check blood sugars daily. 100 each 12  . acetaminophen (TYLENOL) 500 MG tablet Take 1,000-1,500 mg 2 (two) times daily as needed by mouth for moderate pain.    . Alcohol Swabs (B-D SINGLE USE SWABS REGULAR) PADS Used to check blood sugars daily. 100 each 11  . Ascorbic Acid (VITAMIN C) 500 MG tablet Take 500 mg by mouth 2 (two) times daily.     Marland Kitchen aspirin (GOODSENSE ASPIRIN) 325 MG tablet Take 325 mg daily by mouth.     . Blood Glucose Calibration (ACCU-CHEK AVIVA) SOLN 1 Bottle by In Vitro route as needed. 1 each 0  . Blood Glucose Monitoring Suppl (ACCU-CHEK AVIVA) device Use as instructed 1 each 0  . cholecalciferol (VITAMIN D) 1000 units tablet Take 1,000 Units at bedtime by mouth.    . Cyanocobalamin (B-12 PO) Take 1 tablet daily by mouth.    . Cyanocobalamin (VITAMIN B-12 IJ) GET A B12 INJECTION ONCE EVERY MONTH     . docusate sodium (COLACE) 100 MG capsule Take 1 capsule (100 mg total) by mouth daily as needed. 30 capsule 2  . ferrous sulfate 325 (65 FE) MG tablet Take 325 mg 2 (two) times daily by mouth.    . gabapentin (NEURONTIN) 300 MG capsule Take 2 capsules (600 mg total) by mouth at bedtime. 180 capsule 3  . glucose blood (ACCU-CHEK AVIVA) test strip Used to check blood sugars daily. 100 each 12  . Inositol Niacinate (NIACIN  FLUSH FREE) 500 MG CAPS Take 500 mg at bedtime by mouth.     Demetra Shiner. Lancet Devices (SIMPLE DIAGNOSTICS LANCING DEV) MISC   1  . metFORMIN (GLUCOPHAGE-XR) 500 MG 24 hr tablet Take 500 mg by mouth 2 (two) times daily.    . montelukast (SINGULAIR) 10 MG tablet TAKE 1 TABLET AT BEDTIME 90 tablet 3  . Multiple Vitamins-Minerals (MULTIVITAMIN WITH MINERALS) tablet Take 1 tablet by mouth daily.      . promethazine (PHENERGAN) 12.5 MG tablet Take 1 tablet  (12.5 mg total) by mouth every 6 (six) hours as needed for nausea or vomiting. 30 tablet 0  . sertraline (ZOLOFT) 50 MG tablet TAKE 1 TABLET EVERY DAY 90 tablet 3  . simvastatin (ZOCOR) 80 MG tablet Take 1 tablet (80 mg total) by mouth at bedtime. 90 tablet 3  . sulfamethoxazole-trimethoprim (BACTRIM DS,SEPTRA DS) 800-160 MG tablet Take 1 tablet by mouth 2 (two) times daily. 28 tablet 0  . traMADol (ULTRAM) 50 MG tablet Take 1 tablet (50 mg total) by mouth every 12 (twelve) hours as needed. 20 tablet 0  . Zinc 50 MG CAPS Take 50 mg at bedtime by mouth.      No current facility-administered medications on file prior to visit.     No Known Allergies  Objective: There were no vitals filed for this visit.  General: No acute distress, AAOx3  Left foot: Healed abrasion at lateral ankle, there is mild periwound maceration and small amount of gapping at the plantar left foot at surgical site, mild swelling to left foot, no erythema, no warmth, no drainage, no signs of infection noted, Capillary fill time <3 seconds in all digits, gross sensation present via light touch to left foot however protective sensation is absent. No pain or crepitation with range of motion left foot.  No pain with calf compression.   Right foot: Healed abrasions to right first through third toes, no warmth no active drainage, capillary fill time is under 3 seconds with gross sensation present to the right foot however protective sensation is absent no pain with palpation to the right foot or lower calf.  Assessment and Plan:  Problem List Items Addressed This Visit      Nervous and Auditory   CHARCOT-MARIE-TOOTH DISEASE    Other Visit Diagnoses    S/P foot surgery, left    -  Primary   Type 2 diabetes, controlled, with neuropathy (HCC)       Multiple abrasions       PVD (peripheral vascular disease) (HCC)          -Patient seen and evaluated -Discussed wound care -Applied offloading padding to left foot surgical  site and a small amount of Betadine and one Steri-Strip advised patient to make sure to refrain from excessive use of Steri-Strips to prevent maceration to the periwound area; nursing to continue with the same care -Encouraged patient to elevate legs to assist with edema control -Patient may slowly transition to custom shoes with offloading padding -Advised patient to limit activity to necessity  -Advised patient to ice and elevate as necessary  -Continue with gabapentin -Will plan for wound check for his cruise at next office visit. In the meantime, patient to call office if any issues or problems arise.   [name], DPM

## 2017-04-24 NOTE — Telephone Encounter (Signed)
Faxed Dr. Wynema BirchStover's 04/24/2017 11:28am orders to [facility].

## 2017-04-24 NOTE — Telephone Encounter (Signed)
-----   Message from Berinoitorya Stover, [place] sent at 04/24/2017 11:28 AM EDT ----- Regarding: Wound care orders Apply offloading padding to left foot surgical site and a small amount of Betadine and one Steri-Strip advised patient to make sure to refrain from excessive use of Steri-Strips to prevent maceration to the periwound area; nursing to continue with the same care

## 2017-04-29 DIAGNOSIS — E11621 Type 2 diabetes mellitus with foot ulcer: Secondary | ICD-10-CM | POA: Diagnosis not present

## 2017-04-29 DIAGNOSIS — E1140 Type 2 diabetes mellitus with diabetic neuropathy, unspecified: Secondary | ICD-10-CM | POA: Diagnosis not present

## 2017-04-29 DIAGNOSIS — L97419 Non-pressure chronic ulcer of right heel and midfoot with unspecified severity: Secondary | ICD-10-CM | POA: Diagnosis not present

## 2017-04-29 DIAGNOSIS — E1151 Type 2 diabetes mellitus with diabetic peripheral angiopathy without gangrene: Secondary | ICD-10-CM | POA: Diagnosis not present

## 2017-04-29 DIAGNOSIS — I5032 Chronic diastolic (congestive) heart failure: Secondary | ICD-10-CM | POA: Diagnosis not present

## 2017-04-29 DIAGNOSIS — I2510 Atherosclerotic heart disease of native coronary artery without angina pectoris: Secondary | ICD-10-CM | POA: Diagnosis not present

## 2017-04-29 DIAGNOSIS — I110 Hypertensive heart disease with heart failure: Secondary | ICD-10-CM | POA: Diagnosis not present

## 2017-04-29 DIAGNOSIS — Z4800 Encounter for change or removal of nonsurgical wound dressing: Secondary | ICD-10-CM | POA: Diagnosis not present

## 2017-04-29 DIAGNOSIS — Q667 Congenital pes cavus: Secondary | ICD-10-CM | POA: Diagnosis not present

## 2017-05-02 DIAGNOSIS — Q667 Congenital pes cavus: Secondary | ICD-10-CM | POA: Diagnosis not present

## 2017-05-02 DIAGNOSIS — Z4800 Encounter for change or removal of nonsurgical wound dressing: Secondary | ICD-10-CM | POA: Diagnosis not present

## 2017-05-02 DIAGNOSIS — I2510 Atherosclerotic heart disease of native coronary artery without angina pectoris: Secondary | ICD-10-CM | POA: Diagnosis not present

## 2017-05-02 DIAGNOSIS — E1140 Type 2 diabetes mellitus with diabetic neuropathy, unspecified: Secondary | ICD-10-CM | POA: Diagnosis not present

## 2017-05-02 DIAGNOSIS — I5032 Chronic diastolic (congestive) heart failure: Secondary | ICD-10-CM | POA: Diagnosis not present

## 2017-05-02 DIAGNOSIS — I110 Hypertensive heart disease with heart failure: Secondary | ICD-10-CM | POA: Diagnosis not present

## 2017-05-02 DIAGNOSIS — E1151 Type 2 diabetes mellitus with diabetic peripheral angiopathy without gangrene: Secondary | ICD-10-CM | POA: Diagnosis not present

## 2017-05-02 DIAGNOSIS — E11621 Type 2 diabetes mellitus with foot ulcer: Secondary | ICD-10-CM | POA: Diagnosis not present

## 2017-05-02 DIAGNOSIS — L97419 Non-pressure chronic ulcer of right heel and midfoot with unspecified severity: Secondary | ICD-10-CM | POA: Diagnosis not present

## 2017-05-05 DIAGNOSIS — J9610 Chronic respiratory failure, unspecified whether with hypoxia or hypercapnia: Secondary | ICD-10-CM | POA: Diagnosis not present

## 2017-05-05 DIAGNOSIS — G600 Hereditary motor and sensory neuropathy: Secondary | ICD-10-CM | POA: Diagnosis not present

## 2017-05-06 DIAGNOSIS — I2510 Atherosclerotic heart disease of native coronary artery without angina pectoris: Secondary | ICD-10-CM | POA: Diagnosis not present

## 2017-05-06 DIAGNOSIS — E1151 Type 2 diabetes mellitus with diabetic peripheral angiopathy without gangrene: Secondary | ICD-10-CM | POA: Diagnosis not present

## 2017-05-06 DIAGNOSIS — E1140 Type 2 diabetes mellitus with diabetic neuropathy, unspecified: Secondary | ICD-10-CM | POA: Diagnosis not present

## 2017-05-06 DIAGNOSIS — I5032 Chronic diastolic (congestive) heart failure: Secondary | ICD-10-CM | POA: Diagnosis not present

## 2017-05-06 DIAGNOSIS — Z4800 Encounter for change or removal of nonsurgical wound dressing: Secondary | ICD-10-CM | POA: Diagnosis not present

## 2017-05-06 DIAGNOSIS — I110 Hypertensive heart disease with heart failure: Secondary | ICD-10-CM | POA: Diagnosis not present

## 2017-05-06 DIAGNOSIS — E11621 Type 2 diabetes mellitus with foot ulcer: Secondary | ICD-10-CM | POA: Diagnosis not present

## 2017-05-06 DIAGNOSIS — Q667 Congenital pes cavus: Secondary | ICD-10-CM | POA: Diagnosis not present

## 2017-05-06 DIAGNOSIS — L97419 Non-pressure chronic ulcer of right heel and midfoot with unspecified severity: Secondary | ICD-10-CM | POA: Diagnosis not present

## 2017-05-08 ENCOUNTER — Ambulatory Visit (INDEPENDENT_AMBULATORY_CARE_PROVIDER_SITE_OTHER): Payer: Medicare HMO | Admitting: Sports Medicine

## 2017-05-08 ENCOUNTER — Encounter: Payer: Self-pay | Admitting: Sports Medicine

## 2017-05-08 DIAGNOSIS — G600 Hereditary motor and sensory neuropathy: Secondary | ICD-10-CM

## 2017-05-08 DIAGNOSIS — Z9889 Other specified postprocedural states: Secondary | ICD-10-CM

## 2017-05-08 DIAGNOSIS — I739 Peripheral vascular disease, unspecified: Secondary | ICD-10-CM

## 2017-05-08 DIAGNOSIS — E119 Type 2 diabetes mellitus without complications: Secondary | ICD-10-CM | POA: Diagnosis not present

## 2017-05-08 DIAGNOSIS — T07XXXA Unspecified multiple injuries, initial encounter: Secondary | ICD-10-CM

## 2017-05-08 DIAGNOSIS — E663 Overweight: Secondary | ICD-10-CM | POA: Diagnosis not present

## 2017-05-08 DIAGNOSIS — E1140 Type 2 diabetes mellitus with diabetic neuropathy, unspecified: Secondary | ICD-10-CM

## 2017-05-08 DIAGNOSIS — Z723 Lack of physical exercise: Secondary | ICD-10-CM | POA: Diagnosis not present

## 2017-05-08 NOTE — Progress Notes (Signed)
Subjective: [name] is a 84 y.o. male patient seen today in office for POV #5 (DOS 03-18-17), S/P Left wound debridement and removal of sesamoid bones for chronic plantar diabetic foot ulceration. Patient denies pain at surgical site.  States that he is doing good with no pain admits once episode of burning sensation.  Denies calf pain, denies headache, denies chest pain, shortness of breath, nausea, vomiting, fever, or chills. No other issues noted.   FBS not checked today  Patient Active Problem List   Diagnosis Date Noted  . Abnormal breath sounds 03/05/2017  . Chronic pain 02/05/2017  . Acute diastolic CHF (congestive heart failure) (HCC) 12/04/2016  . S/P shoulder replacement, left 11/16/2016  . Preoperative clearance 10/15/2016  . Hyperlipidemia 10/15/2016  . Diabetes (HCC) 03/10/2015  . Perennial and seasonal allergic rhinitis 12/20/2014  . Hematuria 12/20/2014  . Loss of weight 05/06/2014  . Other pancytopenia (HCC) 05/05/2014  . CAP (community acquired pneumonia) 05/05/2014  . Hypocalcemia 05/05/2014  . Skin lesion 04/30/2014  . 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 Abdominal pain, right upper quadrant 05/04/2013  . Cold intolerance 05/04/2013  . Low back pain 04/03/2012  . Encounter for long-term (current) use of other medications 12/11/2011  . Routine general medical examination at a health care facility 12/17/2010  . Screening for prostate cancer 12/12/2010  . Osteoarthrosis, unspecified whether generalized or localized, unspecified site 10/24/2010  . SKIN RASH 06/29/2009  . Iron deficiency anemia 03/10/2009  . BACK PAIN, LUMBAR 03/10/2009  . CHARCOT-MARIE-TOOTH DISEASE 05/03/2008  . DYSPNEA 05/03/2008  . DEPRESSIVE DISORDER NOT ELSEWHERE CLASSIFIED 05/22/2007  . CHEST PAIN 05/22/2007  . ANEMIA, PERNICIOUS 12/02/2006  . Essential hypertension 07/26/2006  . Coronary atherosclerosis 07/26/2006  . GERD 07/26/2006    Current Outpatient Medications on  File Prior to Visit  Medication Sig Dispense Refill  . ACCU-CHEK SOFTCLIX LANCETS lancets Used to check blood sugars daily. 100 each 12  . acetaminophen (TYLENOL) 500 MG tablet Take 1,000-1,500 mg 2 (two) times daily as needed by mouth for moderate pain.    . Alcohol Swabs (B-D SINGLE USE SWABS REGULAR) PADS Used to check blood sugars daily. 100 each 11  . Ascorbic Acid (VITAMIN C) 500 MG tablet Take 500 mg by mouth 2 (two) times daily.     Marland Kitchen aspirin (GOODSENSE ASPIRIN) 325 MG tablet Take 325 mg daily by mouth.     . Blood Glucose Calibration (ACCU-CHEK AVIVA) SOLN 1 Bottle by In Vitro route as needed. 1 each 0  . Blood Glucose Monitoring Suppl (ACCU-CHEK AVIVA) device Use as instructed 1 each 0  . cholecalciferol (VITAMIN D) 1000 units tablet Take 1,000 Units at bedtime by mouth.    . Cyanocobalamin (B-12 PO) Take 1 tablet daily by mouth.    . Cyanocobalamin (VITAMIN B-12 IJ) GET A B12 INJECTION ONCE EVERY MONTH     . docusate sodium (COLACE) 100 MG capsule Take 1 capsule (100 mg total) by mouth daily as needed. 30 capsule 2  . ferrous sulfate 325 (65 FE) MG tablet Take 325 mg 2 (two) times daily by mouth.    . gabapentin (NEURONTIN) 300 MG capsule Take 2 capsules (600 mg total) by mouth at bedtime. 180 capsule 3  . glucose blood (ACCU-CHEK AVIVA) test strip Used to check blood sugars daily. 100 each 12  . Inositol Niacinate (NIACIN FLUSH FREE) 500 MG CAPS Take 500 mg at bedtime by mouth.     Demetra Shiner Devices (SIMPLE DIAGNOSTICS LANCING DEV) MISC  1  . metFORMIN (GLUCOPHAGE-XR) 500 MG 24 hr tablet Take 500 mg by mouth 2 (two) times daily.    . montelukast (SINGULAIR) 10 MG tablet TAKE 1 TABLET AT BEDTIME 90 tablet 3  . Multiple Vitamins-Minerals (MULTIVITAMIN WITH MINERALS) tablet Take 1 tablet by mouth daily.      . promethazine (PHENERGAN) 12.5 MG tablet Take 1 tablet (12.5 mg total) by mouth every 6 (six) hours as needed for nausea or vomiting. 30 tablet 0  . sertraline (ZOLOFT) 50 MG  tablet TAKE 1 TABLET EVERY DAY 90 tablet 3  . simvastatin (ZOCOR) 80 MG tablet Take 1 tablet (80 mg total) by mouth at bedtime. 90 tablet 3  . sulfamethoxazole-trimethoprim (BACTRIM DS,SEPTRA DS) 800-160 MG tablet Take 1 tablet by mouth 2 (two) times daily. 28 tablet 0  . traMADol (ULTRAM) 50 MG tablet Take 1 tablet (50 mg total) by mouth every 12 (twelve) hours as needed. 20 tablet 0  . Zinc 50 MG CAPS Take 50 mg at bedtime by mouth.      No current facility-administered medications on file prior to visit.     No Known Allergies  Objective: There were no vitals filed for this visit.  General: No acute distress, AAOx3  Left foot: Healed abrasion and resolved gapping at plantar left foot at surgical site, mild swelling to left foot, no erythema, no warmth, no drainage, no signs of infection noted, Capillary fill time <3 seconds in all digits, gross sensation present via light touch to left foot however protective sensation is absent. No pain or crepitation with range of motion left foot.  No pain with calf compression.   Right foot: No acute findings.  Assessment and Plan:  Problem List Items Addressed This Visit      Nervous and Auditory   CHARCOT-MARIE-TOOTH DISEASE    Other Visit Diagnoses    S/P foot surgery, left    -  Primary   Type 2 diabetes, controlled, with neuropathy (HCC)       Multiple abrasions       PVD (peripheral vascular disease) (HCC)          -Patient seen and evaluated -Discussed progression of healed wound  -Applied offloading padding to left foot surgical site and recommend patient if they see any drainage to return to using a small amount of Betadine and Steri-Strips  -Encouraged patient to elevate legs to assist with edema control -Continue with custom shoes -Advised patient to limit activity to necessity  -Continue with gabapentin for neuropathy  -Will plan for final wound check after his cruise at next office visit. In the meantime, patient to call  office if any issues or problems arise.   Asencion Islam, DPM

## 2017-05-09 ENCOUNTER — Telehealth: Payer: Self-pay | Admitting: Sports Medicine

## 2017-05-09 NOTE — Telephone Encounter (Signed)
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 I just wanted to follow up because I know he was supposed to see Dr. Marylene Land yesterday for what the plan is. The pt is leaving today to go on a cruise and he will be out of town the next week and a half - two weeks. I need to find out if Dr. Marylene Land wants me to place the pt on hold and see him when he gets home or are we okay to discharge at this point? Please just give me a call to let me know what the plan is at [phone]. Thank you.

## 2017-05-09 NOTE — Telephone Encounter (Signed)
I reviewed pt's LOV and Dr. Marylene Land was planning to perform last wound check at 05/29/2017 appt. I spoke with Gearldine Bienenstock, RN and she states she will keep pt on hold and plan on seeing him the day after 05/29/2017 for orders.

## 2017-05-29 ENCOUNTER — Ambulatory Visit (INDEPENDENT_AMBULATORY_CARE_PROVIDER_SITE_OTHER): Payer: Medicare HMO | Admitting: Sports Medicine

## 2017-05-29 ENCOUNTER — Encounter: Payer: Self-pay | Admitting: Sports Medicine

## 2017-05-29 DIAGNOSIS — G600 Hereditary motor and sensory neuropathy: Secondary | ICD-10-CM

## 2017-05-29 DIAGNOSIS — L97521 Non-pressure chronic ulcer of other part of left foot limited to breakdown of skin: Secondary | ICD-10-CM

## 2017-05-29 DIAGNOSIS — T07XXXA Unspecified multiple injuries, initial encounter: Secondary | ICD-10-CM

## 2017-05-29 DIAGNOSIS — E1140 Type 2 diabetes mellitus with diabetic neuropathy, unspecified: Secondary | ICD-10-CM

## 2017-05-29 DIAGNOSIS — Z9889 Other specified postprocedural states: Secondary | ICD-10-CM | POA: Diagnosis not present

## 2017-05-29 DIAGNOSIS — I739 Peripheral vascular disease, unspecified: Secondary | ICD-10-CM | POA: Diagnosis not present

## 2017-05-29 NOTE — Progress Notes (Signed)
Subjective: [name] is a 84 y.o. male patient seen today in office for POV #6 (DOS 03-18-17), S/P Left wound debridement and removal of sesamoid bones for chronic plantar diabetic foot ulceration. Patient states that area is "good" no issues while on cruise however does admit an episode of burning once while on the cruise ship and also noticing a pinpoint opening as reported by her daughter with no drainage.  States that they have been compliant with using offloading padding. Denies calf pain, denies headache, denies chest pain, shortness of breath, nausea, vomiting, fever, or chills. No other issues noted.   FBS not checked today  Patient Active Problem List   Diagnosis Date Noted  . Abnormal breath sounds 03/05/2017  . Chronic pain 02/05/2017  . Acute diastolic CHF (congestive heart failure) ([place]) 12/04/2016  . S/P shoulder replacement, left 11/16/2016  . Preoperative clearance 10/15/2016  . Hyperlipidemia 10/15/2016  . Diabetes ([place]) 03/10/2015  . Perennial and seasonal allergic rhinitis 12/20/2014  . Hematuria 12/20/2014  . Loss of weight 05/06/2014  . Other pancytopenia ([place]) 05/05/2014  . CAP (community acquired pneumonia) 05/05/2014  . Hypocalcemia 05/05/2014  . Skin lesion 04/30/2014  . 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 Abdominal pain, right upper quadrant 05/04/2013  . Cold intolerance 05/04/2013  . Low back pain 04/03/2012  . Encounter for long-term (current) use of other medications 12/11/2011  . Routine general medical examination at a health care facility 12/17/2010  . Screening for prostate cancer 12/12/2010  . Osteoarthrosis, unspecified whether generalized or localized, unspecified site 10/24/2010  . SKIN RASH 06/29/2009  . Iron deficiency anemia 03/10/2009  . BACK PAIN, LUMBAR 03/10/2009  . [place] DISEASE 05/03/2008  . DYSPNEA 05/03/2008  . DEPRESSIVE DISORDER NOT ELSEWHERE CLASSIFIED 05/22/2007  . CHEST PAIN 05/22/2007  . ANEMIA,  PERNICIOUS 12/02/2006  . Essential hypertension 07/26/2006  . Coronary atherosclerosis 07/26/2006  . GERD 07/26/2006    Current Outpatient Medications on File Prior to Visit  Medication Sig Dispense Refill  . ACCU-CHEK SOFTCLIX LANCETS lancets Used to check blood sugars daily. 100 each 12  . acetaminophen (TYLENOL) 500 MG tablet Take 1,000-1,500 mg 2 (two) times daily as needed by mouth for moderate pain.    . Alcohol Swabs (B-D SINGLE USE SWABS REGULAR) PADS Used to check blood sugars daily. 100 each 11  . Ascorbic Acid (VITAMIN C) 500 MG tablet Take 500 mg by mouth 2 (two) times daily.     Marland Kitchen aspirin (GOODSENSE ASPIRIN) 325 MG tablet Take 325 mg daily by mouth.     . Blood Glucose Calibration (ACCU-CHEK AVIVA) SOLN 1 Bottle by In Vitro route as needed. 1 each 0  . Blood Glucose Monitoring Suppl (ACCU-CHEK AVIVA) device Use as instructed 1 each 0  . cholecalciferol (VITAMIN D) 1000 units tablet Take 1,000 Units at bedtime by mouth.    . Cyanocobalamin (B-12 PO) Take 1 tablet daily by mouth.    . Cyanocobalamin (VITAMIN B-12 IJ) GET A B12 INJECTION ONCE EVERY MONTH     . docusate sodium (COLACE) 100 MG capsule Take 1 capsule (100 mg total) by mouth daily as needed. 30 capsule 2  . ferrous sulfate 325 (65 FE) MG tablet Take 325 mg 2 (two) times daily by mouth.    . gabapentin (NEURONTIN) 300 MG capsule Take 2 capsules (600 mg total) by mouth at bedtime. 180 capsule 3  . glucose blood (ACCU-CHEK AVIVA) test strip Used to check blood sugars daily. 100 each 12  . Inositol  Niacinate (NIACIN FLUSH FREE) 500 MG CAPS Take 500 mg at bedtime by mouth.     Elmore Guise Devices (SIMPLE DIAGNOSTICS LANCING DEV) MISC   1  . metFORMIN (GLUCOPHAGE-XR) 500 MG 24 hr tablet Take 500 mg by mouth 2 (two) times daily.    . montelukast (SINGULAIR) 10 MG tablet TAKE 1 TABLET AT BEDTIME 90 tablet 3  . Multiple Vitamins-Minerals (MULTIVITAMIN WITH MINERALS) tablet Take 1 tablet by mouth daily.      . promethazine  (PHENERGAN) 12.5 MG tablet Take 1 tablet (12.5 mg total) by mouth every 6 (six) hours as needed for nausea or vomiting. 30 tablet 0  . sertraline (ZOLOFT) 50 MG tablet TAKE 1 TABLET EVERY DAY 90 tablet 3  . simvastatin (ZOCOR) 80 MG tablet Take 1 tablet (80 mg total) by mouth at bedtime. 90 tablet 3  . sulfamethoxazole-trimethoprim (BACTRIM DS,SEPTRA DS) 800-160 MG tablet Take 1 tablet by mouth 2 (two) times daily. 28 tablet 0  . traMADol (ULTRAM) 50 MG tablet Take 1 tablet (50 mg total) by mouth every 12 (twelve) hours as needed. 20 tablet 0  . Zinc 50 MG CAPS Take 50 mg at bedtime by mouth.      No current facility-administered medications on file prior to visit.     No Known Allergies  Objective: There were no vitals filed for this visit.  General: No acute distress, AAOx3  Left foot: Surgical site pinpoint opening 0.2x0.1cm with tunneling and granular base with clear drainage, mild swelling to left foot, no erythema, no warmth, no drainage, no signs of infection noted, Capillary fill time <3 seconds in all digits, gross sensation present via light touch to left foot however protective sensation is absent. No pain or crepitation with range of motion left foot.  No pain with calf compression.   Right foot: No acute findings.  Assessment and Plan:  Problem List Items Addressed This Visit      Nervous and Auditory   CHARCOT-MARIE-TOOTH DISEASE    Other Visit Diagnoses    S/P foot surgery, left    -  Primary   Type 2 diabetes, controlled, with neuropathy (HCC)       Multiple abrasions       PVD (peripheral vascular disease) ([place])       Foot ulcer, left, limited to breakdown of skin ([place])           -Patient seen and evaluated - Excisionally dedbrided ulceration at left sub-met 1 to healthy bleeding borders removing nonviable tissue using a sterile chisel blade. Wound measures post debridement as above.  Wound was debrided to the level of the dermis with viable wound base exposed to  promote healing. Hemostasis was achieved with manuel pressure.  Wound culture was obtained will call patient if need to start antibiotics. Patient tolerated procedure well without any discomfort or anesthesia necessary for this wound debridement.  -Applied Prisma and packed it lightly and the pinpoint opening and applied offloading padding and dry sterile dressing and instructed patient to continue with daily dressings at home consisting of the same as instructed - Advised patient to go to the ER or return to office if the wound worsens or if constitutional symptoms are present. -Encouraged patient to elevate legs to assist with edema control -Continue with custom shoes -Advised patient to limit activity to necessity  -Continue with gabapentin for neuropathy  -Return to office in 2 weeks for follow up wound care. In the meantime, patient to call office if any  issues or problems arise.   Landis Martins, DPM

## 2017-05-30 DIAGNOSIS — L97419 Non-pressure chronic ulcer of right heel and midfoot with unspecified severity: Secondary | ICD-10-CM | POA: Diagnosis not present

## 2017-05-30 DIAGNOSIS — I2510 Atherosclerotic heart disease of native coronary artery without angina pectoris: Secondary | ICD-10-CM | POA: Diagnosis not present

## 2017-05-30 DIAGNOSIS — E11621 Type 2 diabetes mellitus with foot ulcer: Secondary | ICD-10-CM | POA: Diagnosis not present

## 2017-05-30 DIAGNOSIS — E1140 Type 2 diabetes mellitus with diabetic neuropathy, unspecified: Secondary | ICD-10-CM | POA: Diagnosis not present

## 2017-05-30 DIAGNOSIS — Z4800 Encounter for change or removal of nonsurgical wound dressing: Secondary | ICD-10-CM | POA: Diagnosis not present

## 2017-05-30 DIAGNOSIS — I110 Hypertensive heart disease with heart failure: Secondary | ICD-10-CM | POA: Diagnosis not present

## 2017-05-30 DIAGNOSIS — Q667 Congenital pes cavus: Secondary | ICD-10-CM | POA: Diagnosis not present

## 2017-05-30 DIAGNOSIS — E1151 Type 2 diabetes mellitus with diabetic peripheral angiopathy without gangrene: Secondary | ICD-10-CM | POA: Diagnosis not present

## 2017-05-30 DIAGNOSIS — I5032 Chronic diastolic (congestive) heart failure: Secondary | ICD-10-CM | POA: Diagnosis not present

## 2017-06-02 LAB — WOUND CULTURE

## 2017-06-04 ENCOUNTER — Telehealth: Payer: Self-pay | Admitting: *Deleted

## 2017-06-04 MED ORDER — SULFAMETHOXAZOLE-TRIMETHOPRIM 800-160 MG PO TABS: 1.0000 | ORAL_TABLET | Freq: Two times a day (BID) | ORAL | 0 refills | Status: DC

## 2017-06-04 NOTE — Telephone Encounter (Signed)
-----   Message from Asencion Islamitorya Stover, [place] sent at 06/04/2017  7:15 AM EDT ----- Culture + for Staph please call patient and send Bactrim  DS tablet: 1 PO q12h for 10 days Thanks Dr. [name]

## 2017-06-04 NOTE — Telephone Encounter (Signed)
I informed pt of the new orders from Dr. [name] of 06/04/2017 7:15am. Pt states his foot has been swelling and he thinks it is because he is up a lot. I told pt to rest and elevate periodically.

## 2017-06-05 DIAGNOSIS — G600 Hereditary motor and sensory neuropathy: Secondary | ICD-10-CM | POA: Diagnosis not present

## 2017-06-05 DIAGNOSIS — J9610 Chronic respiratory failure, unspecified whether with hypoxia or hypercapnia: Secondary | ICD-10-CM | POA: Diagnosis not present

## 2017-06-12 ENCOUNTER — Ambulatory Visit: Payer: Medicare HMO | Admitting: Sports Medicine

## 2017-06-12 ENCOUNTER — Encounter: Payer: Self-pay | Admitting: Sports Medicine

## 2017-06-12 VITALS — BP 136/64 | HR 65 | Temp 98.0°F | Resp 16

## 2017-06-12 DIAGNOSIS — I739 Peripheral vascular disease, unspecified: Secondary | ICD-10-CM

## 2017-06-12 DIAGNOSIS — L97521 Non-pressure chronic ulcer of other part of left foot limited to breakdown of skin: Secondary | ICD-10-CM | POA: Diagnosis not present

## 2017-06-12 DIAGNOSIS — Z9889 Other specified postprocedural states: Secondary | ICD-10-CM

## 2017-06-12 DIAGNOSIS — E1140 Type 2 diabetes mellitus with diabetic neuropathy, unspecified: Secondary | ICD-10-CM

## 2017-06-12 DIAGNOSIS — G600 Hereditary motor and sensory neuropathy: Secondary | ICD-10-CM

## 2017-06-12 NOTE — Progress Notes (Signed)
Subjective: [name] is a 84 y.o. male patient seen today in office for POV #7 (DOS 03-18-17), S/P Left wound debridement and removal of sesamoid bones for chronic plantar diabetic foot ulceration. Patient states that he is doing good, no pain but has swelling especially when he has been doing more.  States that they have been compliant with using offloading padding and dressing area with PRISMA with help of daughter. Denies calf pain, denies headache, denies chest pain, shortness of breath, nausea, vomiting, fever, or chills. No other issues noted.   FBS not checked today  Patient Active Problem List   Diagnosis Date Noted  . Abnormal breath sounds 03/05/2017  . Chronic pain 02/05/2017  . Acute diastolic CHF (congestive heart failure) ([place]) 12/04/2016  . S/P shoulder replacement, left 11/16/2016  . Preoperative clearance 10/15/2016  . Hyperlipidemia 10/15/2016  . Diabetes ([place]) 03/10/2015  . Perennial and seasonal allergic rhinitis 12/20/2014  . Hematuria 12/20/2014  . Loss of weight 05/06/2014  . Other pancytopenia ([place]) 05/05/2014  . CAP (community acquired pneumonia) 05/05/2014  . Hypocalcemia 05/05/2014  . Skin lesion 04/30/2014  . 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 Abdominal pain, right upper quadrant 05/04/2013  . Cold intolerance 05/04/2013  . Low back pain 04/03/2012  . Encounter for long-term (current) use of other medications 12/11/2011  . Routine general medical examination at a health care facility 12/17/2010  . Screening for prostate cancer 12/12/2010  . Osteoarthrosis, unspecified whether generalized or localized, unspecified site 10/24/2010  . SKIN RASH 06/29/2009  . Iron deficiency anemia 03/10/2009  . BACK PAIN, LUMBAR 03/10/2009  . [place] DISEASE 05/03/2008  . DYSPNEA 05/03/2008  . DEPRESSIVE DISORDER NOT ELSEWHERE CLASSIFIED 05/22/2007  . CHEST PAIN 05/22/2007  . ANEMIA, PERNICIOUS 12/02/2006  . Essential hypertension 07/26/2006   . Coronary atherosclerosis 07/26/2006  . GERD 07/26/2006    Current Outpatient Medications on File Prior to Visit  Medication Sig Dispense Refill  . ACCU-CHEK SOFTCLIX LANCETS lancets Used to check blood sugars daily. 100 each 12  . acetaminophen (TYLENOL) 500 MG tablet Take 1,000-1,500 mg 2 (two) times daily as needed by mouth for moderate pain.    . Alcohol Swabs (B-D SINGLE USE SWABS REGULAR) PADS Used to check blood sugars daily. 100 each 11  . Ascorbic Acid (VITAMIN C) 500 MG tablet Take 500 mg by mouth 2 (two) times daily.     Marland Kitchen aspirin (GOODSENSE ASPIRIN) 325 MG tablet Take 325 mg daily by mouth.     . Blood Glucose Calibration (ACCU-CHEK AVIVA) SOLN 1 Bottle by In Vitro route as needed. 1 each 0  . Blood Glucose Monitoring Suppl (ACCU-CHEK AVIVA) device Use as instructed 1 each 0  . cholecalciferol (VITAMIN D) 1000 units tablet Take 1,000 Units at bedtime by mouth.    . Cyanocobalamin (B-12 PO) Take 1 tablet daily by mouth.    . Cyanocobalamin (VITAMIN B-12 IJ) GET A B12 INJECTION ONCE EVERY MONTH     . docusate sodium (COLACE) 100 MG capsule Take 1 capsule (100 mg total) by mouth daily as needed. 30 capsule 2  . ferrous sulfate 325 (65 FE) MG tablet Take 325 mg 2 (two) times daily by mouth.    . gabapentin (NEURONTIN) 300 MG capsule Take 2 capsules (600 mg total) by mouth at bedtime. 180 capsule 3  . glucose blood (ACCU-CHEK AVIVA) test strip Used to check blood sugars daily. 100 each 12  . Inositol Niacinate (NIACIN FLUSH FREE) 500 MG CAPS Take 500 mg  at bedtime by mouth.     Elmore Guise Devices (SIMPLE DIAGNOSTICS LANCING DEV) MISC   1  . metFORMIN (GLUCOPHAGE-XR) 500 MG 24 hr tablet Take 500 mg by mouth 2 (two) times daily.    . montelukast (SINGULAIR) 10 MG tablet TAKE 1 TABLET AT BEDTIME 90 tablet 3  . Multiple Vitamins-Minerals (MULTIVITAMIN WITH MINERALS) tablet Take 1 tablet by mouth daily.      . promethazine (PHENERGAN) 12.5 MG tablet Take 1 tablet (12.5 mg total) by mouth  every 6 (six) hours as needed for nausea or vomiting. 30 tablet 0  . sertraline (ZOLOFT) 50 MG tablet TAKE 1 TABLET EVERY DAY 90 tablet 3  . simvastatin (ZOCOR) 80 MG tablet Take 1 tablet (80 mg total) by mouth at bedtime. 90 tablet 3  . sulfamethoxazole-trimethoprim (BACTRIM DS,SEPTRA DS) 800-160 MG tablet Take 1 tablet by mouth 2 (two) times daily. 28 tablet 0  . sulfamethoxazole-trimethoprim (BACTRIM DS,SEPTRA DS) 800-160 MG tablet Take 1 tablet by mouth 2 (two) times daily. 20 tablet 0  . traMADol (ULTRAM) 50 MG tablet Take 1 tablet (50 mg total) by mouth every 12 (twelve) hours as needed. 20 tablet 0  . Zinc 50 MG CAPS Take 50 mg at bedtime by mouth.      No current facility-administered medications on file prior to visit.     No Known Allergies  Objective: There were no vitals filed for this visit.  General: No acute distress, AAOx3  Left foot: Surgical site pinpoint opening 0.2x0.2cm with tunneling and granular base with no drainage, mild swelling to left foot, no erythema, no warmth, no drainage, no signs of infection noted, Capillary fill time <3 seconds in all digits, gross sensation present via light touch to left foot however protective sensation is absent. No pain or crepitation with range of motion left foot.  No pain with calf compression.   Right foot: No acute findings.  Assessment and Plan:  Problem List Items Addressed This Visit      Nervous and Auditory   CHARCOT-MARIE-TOOTH DISEASE    Other Visit Diagnoses    Foot ulcer, left, limited to breakdown of skin ([place])    -  Primary   S/P foot surgery, left       Type 2 diabetes, controlled, with neuropathy (HCC)       PVD (peripheral vascular disease) ([place])           -Patient seen and evaluated - Excisionally dedbrided ulceration at left sub-met 1 to healthy bleeding borders removing nonviable tissue using a sterile chisel blade. Wound measures post debridement as above.  Wound was debrided to the level of the  dermis with viable wound base exposed to promote healing. Hemostasis was achieved with manuel pressure. Patient tolerated procedure well without any discomfort or anesthesia necessary for this wound debridement.  -Applied antibiotic cream to pinpoint opening and applied offloading padding and dry sterile dressing and instructed patient to continue with daily dressings at home consisting of the same or using PRISMA as instructed until ontiment is received from [place] with Bactrim until completed  - Advised patient to go to the ER or return to office if the wound worsens or if constitutional symptoms are present. -Encouraged patient to elevate legs to assist with edema control -Continue with custom shoes -Advised patient to limit activity to necessity  -Continue with gabapentin for neuropathy  -Return to office in 2-3 weeks for follow up wound care. In the meantime, patient to call office if  any issues or problems arise.   Landis Martins, DPM

## 2017-06-14 ENCOUNTER — Other Ambulatory Visit: Payer: Self-pay | Admitting: Endocrinology

## 2017-06-24 ENCOUNTER — Telehealth: Payer: Self-pay | Admitting: *Deleted

## 2017-06-24 NOTE — Telephone Encounter (Signed)
Mr David CostaBible called this morning stating he and his daughters are concerned both feet are swollen left is worse and red and hot.  He wants your advise can just call in an antibiotic or should he come in to see Dr. Samuella CotaPrice or sooner to see you.  Please advise.

## 2017-06-24 NOTE — Telephone Encounter (Signed)
Patient should go to ER or come in to be seen sooner -Dr. [name]

## 2017-06-24 NOTE — Telephone Encounter (Signed)
Patient did not want to go to the ER request to come in earlier.  Appointment made for Tuesday morning with Dr Samuella CotaPrice

## 2017-06-25 ENCOUNTER — Ambulatory Visit: Payer: Medicare HMO | Admitting: Podiatry

## 2017-06-25 ENCOUNTER — Encounter: Payer: Self-pay | Admitting: Podiatry

## 2017-06-25 ENCOUNTER — Ambulatory Visit (INDEPENDENT_AMBULATORY_CARE_PROVIDER_SITE_OTHER): Payer: Medicare HMO

## 2017-06-25 VITALS — BP 135/55 | HR 61 | Temp 99.0°F | Resp 15

## 2017-06-25 DIAGNOSIS — M869 Osteomyelitis, unspecified: Secondary | ICD-10-CM | POA: Diagnosis not present

## 2017-06-25 DIAGNOSIS — I739 Peripheral vascular disease, unspecified: Secondary | ICD-10-CM

## 2017-06-25 DIAGNOSIS — L97521 Non-pressure chronic ulcer of other part of left foot limited to breakdown of skin: Secondary | ICD-10-CM | POA: Diagnosis not present

## 2017-06-25 DIAGNOSIS — L03116 Cellulitis of left lower limb: Secondary | ICD-10-CM

## 2017-06-25 DIAGNOSIS — Z9889 Other specified postprocedural states: Secondary | ICD-10-CM

## 2017-06-25 DIAGNOSIS — L03115 Cellulitis of right lower limb: Secondary | ICD-10-CM

## 2017-06-25 MED ORDER — SULFAMETHOXAZOLE-TRIMETHOPRIM 800-160 MG PO TABS: 1.0000 | ORAL_TABLET | Freq: Two times a day (BID) | ORAL | 0 refills | Status: DC

## 2017-06-25 NOTE — Progress Notes (Signed)
  Subjective:  Patient ID: [name], male    DOB: [birth year],  MRN: [id number]  Chief Complaint  Patient presents with  . leg warm    L foot redness, swelling, and burning x Sunday Tx: rewrapped Pt.'s daughter stated," he declined to got to the ER." -Pt. denies N/V/Ch/F   84 y.o. male returns for wound care. Believes the wound to be doing worse.  Reports left foot redness and swelling and burning since past Sunday although he says that this has resolved.  Patient's daughter wrapped it.  Patient states that he did undergo the emergency room despite his daughter telling him to.  Denies nausea vomiting fever chills has a appointment with Dr. Cannon Kettle  this Friday. Objective:   Vitals:   06/25/17 1159  BP: (!) 135/55  Pulse: 61  Resp: 15  Temp: 99 F (37.2 C)   General AA&O x3. Normal mood and affect.  Vascular Foot warm to touch.  Neurologic Sensation grossly diminished.  Dermatologic (Wound) Wound Location: Left sub-met 1 Wound Measurement: 0.5 x 0.5 post debridement Wound Base: Mixed Granular/Fibrotic Peri-wound: Calloused Exudate: Scant/small amount Serosanguinous exudate  Wound probes 1 cm to bone  Orthopedic: No pain to palpation either foot.   Assessment & Plan:  Patient was evaluated and treated and all questions answered.  Ulcer left first metatarsal -Debridement as below. -Dressed with Prisma packed into the wound, DSD. -X-rays taken reviewed possible subtle cortical erosion -Refill Bactrim based on prior culture -Follow-up with Dr. Cannon Kettle this Friday   Procedure: Excisional Debridement of Wound Rationale: Removal of non-viable soft tissue from the wound to promote healing.  Anesthesia: None Pre-Debridement Wound Measurements: Overlying hyperkeratosis  Post-Debridement Wound Measurements: 0.5 cm x 0.5 cm x 0.5 cm  Type of Debridement: Excisional Tissue Removed: Non-viable soft tissue Depth of Debridement: Subcu Instrumentation: 312 blade tissue  nipper Technique: Sharp excisional debridement to bleeding, viable wound base.  Dressing: Dry, sterile, compression dressing. Disposition: Patient tolerated procedure well. Patient to return in 1 week for follow-up.  No follow-ups on file.

## 2017-06-26 ENCOUNTER — Other Ambulatory Visit: Payer: Self-pay | Admitting: Podiatry

## 2017-06-26 DIAGNOSIS — M869 Osteomyelitis, unspecified: Secondary | ICD-10-CM

## 2017-06-26 DIAGNOSIS — L97521 Non-pressure chronic ulcer of other part of left foot limited to breakdown of skin: Secondary | ICD-10-CM

## 2017-06-26 DIAGNOSIS — L03116 Cellulitis of left lower limb: Secondary | ICD-10-CM

## 2017-06-26 DIAGNOSIS — I739 Peripheral vascular disease, unspecified: Secondary | ICD-10-CM

## 2017-06-26 DIAGNOSIS — L03115 Cellulitis of right lower limb: Secondary | ICD-10-CM

## 2017-06-26 DIAGNOSIS — Z9889 Other specified postprocedural states: Secondary | ICD-10-CM

## 2017-06-28 ENCOUNTER — Ambulatory Visit: Payer: Medicare HMO | Admitting: Sports Medicine

## 2017-06-28 ENCOUNTER — Encounter: Payer: Self-pay | Admitting: Sports Medicine

## 2017-06-28 VITALS — BP 138/88 | HR 80 | Temp 98.6°F | Resp 17

## 2017-06-28 DIAGNOSIS — Z9889 Other specified postprocedural states: Secondary | ICD-10-CM

## 2017-06-28 DIAGNOSIS — I739 Peripheral vascular disease, unspecified: Secondary | ICD-10-CM

## 2017-06-28 DIAGNOSIS — L97529 Non-pressure chronic ulcer of other part of left foot with unspecified severity: Secondary | ICD-10-CM

## 2017-06-28 DIAGNOSIS — L03116 Cellulitis of left lower limb: Secondary | ICD-10-CM

## 2017-06-28 DIAGNOSIS — M869 Osteomyelitis, unspecified: Secondary | ICD-10-CM | POA: Diagnosis not present

## 2017-06-28 NOTE — Progress Notes (Signed)
Subjective: [name] is a 84 y.o. male patient seen today in office for POV #9 (DOS 03-18-17), S/P Left wound debridement and removal of sesamoid bones for chronic plantar diabetic foot ulceration. Patient states that he experienced a lot of swelling and redness on Tuesday morning and presented to office later that day for evaluation and was seen by Dr. March Rummage.  Patient reports that he has been back on his Bactrim antibiotic with improvement with the redness and warmth however the foot and leg is still very swollen patient reports that his daughter has been helping with his dressing changes applying Betadine only to the site however they are concerned and worried if he will end up losing his foot like his brother did. Denies calf pain, denies headache, denies chest pain, shortness of breath, nausea, vomiting, fever, or chills. No other issues noted.   FBS not checked today  Patient Active Problem List   Diagnosis Date Noted  . Abnormal breath sounds 03/05/2017  . Chronic pain 02/05/2017  . Acute diastolic CHF (congestive heart failure) ([place]) 12/04/2016  . S/P shoulder replacement, left 11/16/2016  . Preoperative clearance 10/15/2016  . Hyperlipidemia 10/15/2016  . Diabetes ([place]) 03/10/2015  . Perennial and seasonal allergic rhinitis 12/20/2014  . Hematuria 12/20/2014  . Loss of weight 05/06/2014  . Other pancytopenia ([place]) 05/05/2014  . CAP (community acquired pneumonia) 05/05/2014  . Hypocalcemia 05/05/2014  . Skin lesion 04/30/2014  . 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 Abdominal pain, right upper quadrant 05/04/2013  . Cold intolerance 05/04/2013  . Low back pain 04/03/2012  . Encounter for long-term (current) use of other medications 12/11/2011  . Routine general medical examination at a health care facility 12/17/2010  . Screening for prostate cancer 12/12/2010  . Osteoarthrosis, unspecified whether generalized or localized, unspecified site 10/24/2010  . SKIN RASH  06/29/2009  . Iron deficiency anemia 03/10/2009  . BACK PAIN, LUMBAR 03/10/2009  . [place] DISEASE 05/03/2008  . DYSPNEA 05/03/2008  . DEPRESSIVE DISORDER NOT ELSEWHERE CLASSIFIED 05/22/2007  . CHEST PAIN 05/22/2007  . ANEMIA, PERNICIOUS 12/02/2006  . Essential hypertension 07/26/2006  . Coronary atherosclerosis 07/26/2006  . GERD 07/26/2006    Current Outpatient Medications on File Prior to Visit  Medication Sig Dispense Refill  . ACCU-CHEK SOFTCLIX LANCETS lancets Used to check blood sugars daily. 100 each 12  . acetaminophen (TYLENOL) 500 MG tablet Take 1,000-1,500 mg 2 (two) times daily as needed by mouth for moderate pain.    . Alcohol Swabs (B-D SINGLE USE SWABS REGULAR) PADS Used to check blood sugars daily. 100 each 11  . Ascorbic Acid (VITAMIN C) 500 MG tablet Take 500 mg by mouth 2 (two) times daily.     Marland Kitchen aspirin (GOODSENSE ASPIRIN) 325 MG tablet Take 325 mg daily by mouth.     . Blood Glucose Calibration (ACCU-CHEK AVIVA) SOLN 1 Bottle by In Vitro route as needed. 1 each 0  . Blood Glucose Monitoring Suppl (ACCU-CHEK AVIVA) device Use as instructed 1 each 0  . cholecalciferol (VITAMIN D) 1000 units tablet Take 1,000 Units at bedtime by mouth.    . Cyanocobalamin (B-12 PO) Take 1 tablet daily by mouth.    . Cyanocobalamin (VITAMIN B-12 IJ) GET A B12 INJECTION ONCE EVERY MONTH     . docusate sodium (COLACE) 100 MG capsule Take 1 capsule (100 mg total) by mouth daily as needed. 30 capsule 2  . ferrous sulfate 325 (65 FE) MG tablet Take 325 mg 2 (two) times daily  by mouth.    . gabapentin (NEURONTIN) 300 MG capsule Take 2 capsules (600 mg total) by mouth at bedtime. 180 capsule 3  . glucose blood (ACCU-CHEK AVIVA) test strip Used to check blood sugars daily. 100 each 12  . Inositol Niacinate (NIACIN FLUSH FREE) 500 MG CAPS Take 500 mg at bedtime by mouth.     Elmore Guise Devices (SIMPLE DIAGNOSTICS LANCING DEV) MISC   1  . metFORMIN (GLUCOPHAGE-XR) 500 MG 24 hr tablet  Take 500 mg by mouth 2 (two) times daily.    . montelukast (SINGULAIR) 10 MG tablet TAKE 1 TABLET AT BEDTIME 90 tablet 3  . Multiple Vitamins-Minerals (MULTIVITAMIN WITH MINERALS) tablet Take 1 tablet by mouth daily.      . promethazine (PHENERGAN) 12.5 MG tablet Take 1 tablet (12.5 mg total) by mouth every 6 (six) hours as needed for nausea or vomiting. 30 tablet 0  . sertraline (ZOLOFT) 50 MG tablet TAKE 1 TABLET EVERY DAY 90 tablet 3  . simvastatin (ZOCOR) 80 MG tablet Take 1 tablet (80 mg total) by mouth at bedtime. 90 tablet 3  . sulfamethoxazole-trimethoprim (BACTRIM DS,SEPTRA DS) 800-160 MG tablet Take 1 tablet by mouth 2 (two) times daily. 20 tablet 0  . sulfamethoxazole-trimethoprim (BACTRIM DS,SEPTRA DS) 800-160 MG tablet Take 1 tablet by mouth 2 (two) times daily. 14 tablet 0  . traMADol (ULTRAM) 50 MG tablet Take 1 tablet (50 mg total) by mouth every 12 (twelve) hours as needed. 20 tablet 0  . Zinc 50 MG CAPS Take 50 mg at bedtime by mouth.      No current facility-administered medications on file prior to visit.     No Known Allergies  Objective: There were no vitals filed for this visit.  General: No acute distress, AAOx3  Left foot: Surgical site pinpoint opening 0.2x0.2cm with tunneling to bone and range and granular base with no drainage, mild swelling to left foot, decreased erythema, no warmth, no drainage, no signs of infection noted, Capillary fill time <3 seconds in all digits, gross sensation present via light touch to left foot however protective sensation is absent. No pain or crepitation with range of motion left foot.  No pain with calf compression.   Right foot: No acute findings.  Assessment and Plan:  Problem List Items Addressed This Visit    None    Visit Diagnoses    Osteomyelitis of left foot, unspecified type ([place])    -  Primary   Foot ulcer, left, with unspecified severity ([place])       Cellulitis of left leg       PVD (peripheral vascular disease)  ([facility])       S/P foot surgery, left          -Patient seen and evaluated - Excisionally dedbrided ulceration at left sub-met 1 to healthy bleeding borders removing nonviable tissue using a sterile chisel blade. Wound measures post debridement as above.  Wound was debrided  subcutaneous fat with viable wound base exposed to promote healing. Hemostasis was achieved with manuel pressure. Patient tolerated procedure well without any discomfort or anesthesia necessary for this wound debridement.  -Applied Betadine and applied offloading padding and dry sterile dressing and instructed patient to continue with daily dressings at home consisting of the same  Ordered CT scan for further evaluation for possible osteomyelitis however patient is status post-surgery with removal of sesamoids so it is likely that there may be some surgical changes to this area however I did advise  patient that if CT scan is positive we will consider further bone resection versus IV antibiotics for 6 weeks.  If CT scan is negative then encourage patient to proceed with getting his custom compound antibiotic powder from [facility] with Bactrim until completed  - Advised patient to go to the ER or return to office if the wound worsens or if constitutional symptoms are present. -Encouraged patient to elevate legs to assist with edema control -Continue with custom shoes -Advised patient to limit activity to necessity  -Return to office in 2-3 weeks for follow up wound care. In the meantime, patient to call office if any issues or problems arise.   Landis Martins, DPM

## 2017-07-01 ENCOUNTER — Encounter: Payer: Self-pay | Admitting: *Deleted

## 2017-07-01 ENCOUNTER — Telehealth: Payer: Self-pay | Admitting: *Deleted

## 2017-07-01 DIAGNOSIS — M869 Osteomyelitis, unspecified: Secondary | ICD-10-CM

## 2017-07-01 NOTE — Telephone Encounter (Signed)
Sent CT Rx over to [facility] for scheduling and notified pt their facility will contact him for the appt.

## 2017-07-01 NOTE — Telephone Encounter (Signed)
-----   Message from Asencion Islamitorya Stover, [place] sent at 06/28/2017  2:00 PM EDT ----- Regarding: CT Scan Left foot Chronic ulcer Eval for osteomyelitis Patient can not lay flat for MRI must do CT  -Dr. [name]

## 2017-07-01 NOTE — Telephone Encounter (Signed)
This encounter was created in error - please disregard.

## 2017-07-03 ENCOUNTER — Telehealth: Payer: Self-pay | Admitting: *Deleted

## 2017-07-05 DIAGNOSIS — J9610 Chronic respiratory failure, unspecified whether with hypoxia or hypercapnia: Secondary | ICD-10-CM | POA: Diagnosis not present

## 2017-07-05 DIAGNOSIS — G600 Hereditary motor and sensory neuropathy: Secondary | ICD-10-CM | POA: Diagnosis not present

## 2017-07-08 ENCOUNTER — Telehealth: Payer: Self-pay | Admitting: *Deleted

## 2017-07-08 NOTE — Telephone Encounter (Signed)
I spoke with Lurena JoinerRebecca and told her the last insurance pt gave us was [employer]. Lurena JoinerRebecca states pt is current with [facility] as well, as of 01/2017. I found the card for [facility] and will have Tomasa HoseJ. Quintana, RN pre-cert.

## 2017-07-08 NOTE — Telephone Encounter (Signed)
Patient is at [facility] for his CT and has been told it was never authorized.  They are going to reschedule and would like a call ASAP on the status.

## 2017-07-08 NOTE — Telephone Encounter (Signed)
[name] - [facility] states request for PA was sent 07/03/2017, and pt was scheduled today for CT.

## 2017-07-10 ENCOUNTER — Telehealth: Payer: Self-pay | Admitting: *Deleted

## 2017-07-10 NOTE — Telephone Encounter (Signed)
Nancie NeasLisa - [facility] states pt's insurance did require pre-cert with [employer] for the [facility] insurance with the H identification number, AUTHORIZATION # [id number], VALID UNTIL 08/09/2017. Misty StanleyLisa states pt has been scheduled.

## 2017-07-10 NOTE — Telephone Encounter (Signed)
"  I'm calling about David RamsayFrank [name].  He's scheduled for a CT ([id number]) scan here at [facility].  His [employer] requires authorization.  We need that authorization before 4 pm on Friday or we will have to postpone the appointment.  We are off tomorrow but we will have someone working in our benefits department on Friday.  Lahoma CrockerJessica Q worked on the authorization on 07/08/2017.

## 2017-07-10 NOTE — Telephone Encounter (Signed)
Left message apologizing for the problem pt was having with the prior authorization of the CT, that our office had contact pt's insurance company and had spoken to a representative who told our office no prior authorization was needed. Our office had received a call from Nancie NeasLisa - [facility] and she states she called [employer] and received PA. I apologized for they trouble and hope they were able to get the CT today.

## 2017-07-10 NOTE — Telephone Encounter (Signed)
"  This is [name].  I'm calling for my dad, David RamsayFrank Carneal.  He was supposed to have a CAT scan done today on his left ankle today at [facility].  They are telling us that it has not been approved.  Do we continue to try and have the test done or do we leave?  I am sorry to be calling you back so late.  Did everything get taken care of with the CT scan for your dad?  "No, we are still waiting."  The process was initiated with [facility].  Lahoma CrockerJessica Q sent them all the information that they needed.  So, we are waiting on a response.  Once we hear something, we will let you know.  We were closed Thursday and Friday of last week.  So I apologize for the mix up.  "You've been trying to play catch up."  Yes, ma'am I have.

## 2017-07-15 ENCOUNTER — Other Ambulatory Visit: Payer: Self-pay | Admitting: Sports Medicine

## 2017-07-15 ENCOUNTER — Encounter: Payer: Self-pay | Admitting: Sports Medicine

## 2017-07-15 DIAGNOSIS — M86172 Other acute osteomyelitis, left ankle and foot: Secondary | ICD-10-CM | POA: Diagnosis not present

## 2017-07-15 MED ORDER — SULFAMETHOXAZOLE-TRIMETHOPRIM 800-160 MG PO TABS: 1.0000 | ORAL_TABLET | Freq: Two times a day (BID) | ORAL | 0 refills | Status: DC

## 2017-07-15 NOTE — Telephone Encounter (Signed)
Daughter called stating the foot infected again and would like for an antibiotic called in to [facility] in [place].

## 2017-07-15 NOTE — Progress Notes (Signed)
Refilled Bactrim Daughter called and reported that foot is infected -Dr. [name]

## 2017-07-17 ENCOUNTER — Encounter: Payer: Self-pay | Admitting: Sports Medicine

## 2017-07-17 ENCOUNTER — Ambulatory Visit (INDEPENDENT_AMBULATORY_CARE_PROVIDER_SITE_OTHER): Payer: Medicare HMO | Admitting: Sports Medicine

## 2017-07-17 DIAGNOSIS — E1169 Type 2 diabetes mellitus with other specified complication: Secondary | ICD-10-CM | POA: Diagnosis not present

## 2017-07-17 DIAGNOSIS — Z951 Presence of aortocoronary bypass graft: Secondary | ICD-10-CM | POA: Diagnosis not present

## 2017-07-17 DIAGNOSIS — L03116 Cellulitis of left lower limb: Secondary | ICD-10-CM

## 2017-07-17 DIAGNOSIS — M009 Pyogenic arthritis, unspecified: Secondary | ICD-10-CM | POA: Diagnosis not present

## 2017-07-17 DIAGNOSIS — F418 Other specified anxiety disorders: Secondary | ICD-10-CM | POA: Diagnosis not present

## 2017-07-17 DIAGNOSIS — E782 Mixed hyperlipidemia: Secondary | ICD-10-CM | POA: Diagnosis not present

## 2017-07-17 DIAGNOSIS — L97529 Non-pressure chronic ulcer of other part of left foot with unspecified severity: Secondary | ICD-10-CM | POA: Diagnosis not present

## 2017-07-17 DIAGNOSIS — M00872 Arthritis due to other bacteria, left ankle and foot: Secondary | ICD-10-CM | POA: Diagnosis not present

## 2017-07-17 DIAGNOSIS — I2510 Atherosclerotic heart disease of native coronary artery without angina pectoris: Secondary | ICD-10-CM | POA: Diagnosis not present

## 2017-07-17 DIAGNOSIS — Z79899 Other long term (current) drug therapy: Secondary | ICD-10-CM | POA: Diagnosis not present

## 2017-07-17 DIAGNOSIS — I739 Peripheral vascular disease, unspecified: Secondary | ICD-10-CM | POA: Diagnosis not present

## 2017-07-17 DIAGNOSIS — E1165 Type 2 diabetes mellitus with hyperglycemia: Secondary | ICD-10-CM | POA: Diagnosis not present

## 2017-07-17 DIAGNOSIS — M869 Osteomyelitis, unspecified: Secondary | ICD-10-CM

## 2017-07-17 DIAGNOSIS — B9561 Methicillin susceptible Staphylococcus aureus infection as the cause of diseases classified elsewhere: Secondary | ICD-10-CM | POA: Diagnosis not present

## 2017-07-17 DIAGNOSIS — R5381 Other malaise: Secondary | ICD-10-CM | POA: Diagnosis not present

## 2017-07-17 DIAGNOSIS — M79605 Pain in left leg: Secondary | ICD-10-CM | POA: Diagnosis not present

## 2017-07-17 DIAGNOSIS — E559 Vitamin D deficiency, unspecified: Secondary | ICD-10-CM | POA: Diagnosis not present

## 2017-07-17 DIAGNOSIS — E119 Type 2 diabetes mellitus without complications: Secondary | ICD-10-CM | POA: Diagnosis not present

## 2017-07-17 DIAGNOSIS — S91302S Unspecified open wound, left foot, sequela: Secondary | ICD-10-CM | POA: Diagnosis not present

## 2017-07-17 DIAGNOSIS — R262 Difficulty in walking, not elsewhere classified: Secondary | ICD-10-CM | POA: Diagnosis not present

## 2017-07-17 DIAGNOSIS — Z89412 Acquired absence of left great toe: Secondary | ICD-10-CM | POA: Diagnosis not present

## 2017-07-17 DIAGNOSIS — J328 Other chronic sinusitis: Secondary | ICD-10-CM | POA: Diagnosis not present

## 2017-07-17 DIAGNOSIS — R918 Other nonspecific abnormal finding of lung field: Secondary | ICD-10-CM | POA: Diagnosis not present

## 2017-07-17 DIAGNOSIS — M86672 Other chronic osteomyelitis, left ankle and foot: Secondary | ICD-10-CM | POA: Diagnosis not present

## 2017-07-17 DIAGNOSIS — M86172 Other acute osteomyelitis, left ankle and foot: Secondary | ICD-10-CM | POA: Diagnosis not present

## 2017-07-17 NOTE — Progress Notes (Signed)
Subjective: [name] is a 84 y.o. male patient seen today in office for for follow-up evaluation of left foot ulceration states that on Sunday he has been noticing more swelling in the foot and has had sharp pain that has been constant that is radiating up the foot and leg states that his foot is warm swollen tender and that he has been taking his Bactrim antibiotic and Tylenol however he has noticed that he has had a fever of 101 and also chills states that he also saw his primary care doctor today who was going to call and penicillin.  Patient did not check blood sugar but reports that progressively he has been feeling bad with a lot of pain to the left foot.  Denies calf pain, denies headache, denies chest pain, shortness of breath, nausea, or  vomiting. No other issues noted.   Patient Active Problem List   Diagnosis Date Noted  . Abnormal breath sounds 03/05/2017  . Chronic pain 02/05/2017  . Acute diastolic CHF (congestive heart failure) ([place]) 12/04/2016  . S/P shoulder replacement, left 11/16/2016  . Preoperative clearance 10/15/2016  . Hyperlipidemia 10/15/2016  . Diabetes ([place]) 03/10/2015  . Perennial and seasonal allergic rhinitis 12/20/2014  . Hematuria 12/20/2014  . Loss of weight 05/06/2014  . Other pancytopenia ([place]) 05/05/2014  . CAP (community acquired pneumonia) 05/05/2014  . Hypocalcemia 05/05/2014  . Skin lesion 04/30/2014  . 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 Abdominal pain, right upper quadrant 05/04/2013  . Cold intolerance 05/04/2013  . Low back pain 04/03/2012  . Encounter for long-term (current) use of other medications 12/11/2011  . Routine general medical examination at a health care facility 12/17/2010  . Screening for prostate cancer 12/12/2010  . Osteoarthrosis, unspecified whether generalized or localized, unspecified site 10/24/2010  . SKIN RASH 06/29/2009  . Iron deficiency anemia 03/10/2009  . BACK PAIN, LUMBAR 03/10/2009  .  [place] DISEASE 05/03/2008  . DYSPNEA 05/03/2008  . DEPRESSIVE DISORDER NOT ELSEWHERE CLASSIFIED 05/22/2007  . CHEST PAIN 05/22/2007  . ANEMIA, PERNICIOUS 12/02/2006  . Essential hypertension 07/26/2006  . Coronary atherosclerosis 07/26/2006  . GERD 07/26/2006    Current Outpatient Medications on File Prior to Visit  Medication Sig Dispense Refill  . ACCU-CHEK SOFTCLIX LANCETS lancets Used to check blood sugars daily. 100 each 12  . acetaminophen (TYLENOL) 500 MG tablet Take 1,000-1,500 mg 2 (two) times daily as needed by mouth for moderate pain.    . Alcohol Swabs (B-D SINGLE USE SWABS REGULAR) PADS Used to check blood sugars daily. 100 each 11  . Ascorbic Acid (VITAMIN C) 500 MG tablet Take 500 mg by mouth 2 (two) times daily.     Marland Kitchen aspirin (GOODSENSE ASPIRIN) 325 MG tablet Take 325 mg daily by mouth.     . Blood Glucose Calibration (ACCU-CHEK AVIVA) SOLN 1 Bottle by In Vitro route as needed. 1 each 0  . Blood Glucose Monitoring Suppl (ACCU-CHEK AVIVA) device Use as instructed 1 each 0  . cholecalciferol (VITAMIN D) 1000 units tablet Take 1,000 Units at bedtime by mouth.    . Cyanocobalamin (B-12 PO) Take 1 tablet daily by mouth.    . Cyanocobalamin (VITAMIN B-12 IJ) GET A B12 INJECTION ONCE EVERY MONTH     . docusate sodium (COLACE) 100 MG capsule Take 1 capsule (100 mg total) by mouth daily as needed. 30 capsule 2  . ferrous sulfate 325 (65 FE) MG tablet Take 325 mg 2 (two) times daily by mouth.    Marland Kitchen  gabapentin (NEURONTIN) 300 MG capsule Take 2 capsules (600 mg total) by mouth at bedtime. 180 capsule 3  . glucose blood (ACCU-CHEK AVIVA) test strip Used to check blood sugars daily. 100 each 12  . Inositol Niacinate (NIACIN FLUSH FREE) 500 MG CAPS Take 500 mg at bedtime by mouth.     Elmore Guise Devices (SIMPLE DIAGNOSTICS LANCING DEV) MISC   1  . metFORMIN (GLUCOPHAGE-XR) 500 MG 24 hr tablet Take 500 mg by mouth 2 (two) times daily.    . montelukast (SINGULAIR) 10 MG tablet  TAKE 1 TABLET AT BEDTIME 90 tablet 3  . Multiple Vitamins-Minerals (MULTIVITAMIN WITH MINERALS) tablet Take 1 tablet by mouth daily.      . promethazine (PHENERGAN) 12.5 MG tablet Take 1 tablet (12.5 mg total) by mouth every 6 (six) hours as needed for nausea or vomiting. 30 tablet 0  . sertraline (ZOLOFT) 50 MG tablet TAKE 1 TABLET EVERY DAY 90 tablet 3  . simvastatin (ZOCOR) 80 MG tablet Take 1 tablet (80 mg total) by mouth at bedtime. 90 tablet 3  . sulfamethoxazole-trimethoprim (BACTRIM DS,SEPTRA DS) 800-160 MG tablet Take 1 tablet by mouth 2 (two) times daily. 20 tablet 0  . sulfamethoxazole-trimethoprim (BACTRIM DS,SEPTRA DS) 800-160 MG tablet Take 1 tablet by mouth 2 (two) times daily. 14 tablet 0  . traMADol (ULTRAM) 50 MG tablet Take 1 tablet (50 mg total) by mouth every 12 (twelve) hours as needed. 20 tablet 0  . Zinc 50 MG CAPS Take 50 mg at bedtime by mouth.      No current facility-administered medications on file prior to visit.     No Known Allergies  Objective: There were no vitals filed for this visit.  General: No acute distress, AAOx3  Left foot: Sub met 1 pinpoint opening 0.2x0.2cm with tunneling to bone and range and granular base with no active drainage, moderate swelling to left foot, significant cellulitis to the level of the midfoot, no warmth, no drainage, no signs of infection noted, Capillary fill time <3 seconds in all digits, pain with light touch to left foot.  No pain with calf compression.   Assessment and Plan:  Problem List Items Addressed This Visit    None    Visit Diagnoses    Osteomyelitis of left foot, unspecified type ([place])    -  Primary   Foot ulcer, left, with unspecified severity ([place])       Cellulitis of left leg       PVD (peripheral vascular disease) ([place])          -Patient seen and evaluated -Cleanse ulceration and applied a small amount of Betadine and dry dressing -Directly admitted patient to hospital for IV antibiotics, ABIs, and  to be n.p.o. at midnight for operating room in the morning for left hallux amputation with culture -We will follow patient inpatient -Return to office after discharge.  In the meantime, patient to call office if any issues or problems arise.   Landis Martins, DPM

## 2017-07-18 DIAGNOSIS — M79605 Pain in left leg: Secondary | ICD-10-CM

## 2017-07-18 DIAGNOSIS — M86672 Other chronic osteomyelitis, left ankle and foot: Secondary | ICD-10-CM

## 2017-07-19 ENCOUNTER — Ambulatory Visit: Payer: Medicare HMO | Admitting: Sports Medicine

## 2017-07-22 ENCOUNTER — Telehealth: Payer: Self-pay | Admitting: *Deleted

## 2017-07-22 DIAGNOSIS — Z89412 Acquired absence of left great toe: Secondary | ICD-10-CM | POA: Diagnosis not present

## 2017-07-22 DIAGNOSIS — Z4781 Encounter for orthopedic aftercare following surgical amputation: Secondary | ICD-10-CM | POA: Diagnosis not present

## 2017-07-22 DIAGNOSIS — F329 Major depressive disorder, single episode, unspecified: Secondary | ICD-10-CM | POA: Diagnosis not present

## 2017-07-22 DIAGNOSIS — F419 Anxiety disorder, unspecified: Secondary | ICD-10-CM | POA: Diagnosis not present

## 2017-07-22 DIAGNOSIS — B9561 Methicillin susceptible Staphylococcus aureus infection as the cause of diseases classified elsewhere: Secondary | ICD-10-CM | POA: Diagnosis not present

## 2017-07-22 DIAGNOSIS — M86172 Other acute osteomyelitis, left ankle and foot: Secondary | ICD-10-CM | POA: Diagnosis not present

## 2017-07-22 DIAGNOSIS — I2510 Atherosclerotic heart disease of native coronary artery without angina pectoris: Secondary | ICD-10-CM | POA: Diagnosis not present

## 2017-07-22 DIAGNOSIS — G7100 Muscular dystrophy, unspecified: Secondary | ICD-10-CM | POA: Diagnosis not present

## 2017-07-22 DIAGNOSIS — E1169 Type 2 diabetes mellitus with other specified complication: Secondary | ICD-10-CM | POA: Diagnosis not present

## 2017-07-22 NOTE — Telephone Encounter (Signed)
Marcelino DusterMichelle - [facility] states she opened pt in their system today, dressing is clean and dry, leg looks good, did not perform dressing changes due to surgery dressing was not to be changed per Dr. Wynema BirchStover's orders.

## 2017-07-23 ENCOUNTER — Other Ambulatory Visit: Payer: Self-pay

## 2017-07-23 DIAGNOSIS — I2510 Atherosclerotic heart disease of native coronary artery without angina pectoris: Secondary | ICD-10-CM | POA: Diagnosis not present

## 2017-07-23 DIAGNOSIS — Z4781 Encounter for orthopedic aftercare following surgical amputation: Secondary | ICD-10-CM | POA: Diagnosis not present

## 2017-07-23 DIAGNOSIS — M86172 Other acute osteomyelitis, left ankle and foot: Secondary | ICD-10-CM | POA: Diagnosis not present

## 2017-07-23 DIAGNOSIS — G7100 Muscular dystrophy, unspecified: Secondary | ICD-10-CM | POA: Diagnosis not present

## 2017-07-23 DIAGNOSIS — F419 Anxiety disorder, unspecified: Secondary | ICD-10-CM | POA: Diagnosis not present

## 2017-07-23 DIAGNOSIS — E1169 Type 2 diabetes mellitus with other specified complication: Secondary | ICD-10-CM | POA: Diagnosis not present

## 2017-07-23 DIAGNOSIS — Z89412 Acquired absence of left great toe: Secondary | ICD-10-CM | POA: Diagnosis not present

## 2017-07-23 DIAGNOSIS — F329 Major depressive disorder, single episode, unspecified: Secondary | ICD-10-CM | POA: Diagnosis not present

## 2017-07-23 DIAGNOSIS — B9561 Methicillin susceptible Staphylococcus aureus infection as the cause of diseases classified elsewhere: Secondary | ICD-10-CM | POA: Diagnosis not present

## 2017-07-23 NOTE — Patient Outreach (Signed)
[facility]) Care Management  07/23/2017  David GottronFrank J [name] 05-06-1931 [id number]     Transition of Care Referral  Referral Date: 07/23/17 Referral Source: [employer] Discharge Report Date of Admission: 07/17/17 Diagnosis: unknown Date of Discharge: 07/21/17 [facility]: [facility]    Outreach attempt # 1 to patient. Spoke with patient. He voices that he is doing fairly well. Patient reports that his dtr is staying with him temporarily to help him out. Patient states that he has to keep his leg elevated and stay off it. He was unable to complete call as he did not have his discharge paperwork handy. He voices that his dtr is helping him keep trak on everything but she is currently at work. Patient states he goes to see his foot doctor in this Friday. He voices that he has a new PCP but was unable to provide office name of MD name but just voices that [place] was in [place]. Patient states he is getting HH services and nurse was out to visit with him on yesterday. He denies any issues regarding his meds or transportation. Patient appreciative of follow up call but voices no [facility] or RN CM needs or concerns at this time.    Plan: RN CM will close case at this time.    Antionette Fairyoshanda Daleyza Gadomski, RN,BSN,CCM [facility] Care Management Telephonic Care Management Coordinator Direct Phone: [phone] Toll Free: [phone] Fax: [phone]

## 2017-07-24 DIAGNOSIS — G7100 Muscular dystrophy, unspecified: Secondary | ICD-10-CM | POA: Diagnosis not present

## 2017-07-24 DIAGNOSIS — I2510 Atherosclerotic heart disease of native coronary artery without angina pectoris: Secondary | ICD-10-CM | POA: Diagnosis not present

## 2017-07-24 DIAGNOSIS — F329 Major depressive disorder, single episode, unspecified: Secondary | ICD-10-CM | POA: Diagnosis not present

## 2017-07-24 DIAGNOSIS — E1169 Type 2 diabetes mellitus with other specified complication: Secondary | ICD-10-CM | POA: Diagnosis not present

## 2017-07-24 DIAGNOSIS — Z4781 Encounter for orthopedic aftercare following surgical amputation: Secondary | ICD-10-CM | POA: Diagnosis not present

## 2017-07-24 DIAGNOSIS — Z89412 Acquired absence of left great toe: Secondary | ICD-10-CM | POA: Diagnosis not present

## 2017-07-24 DIAGNOSIS — M86172 Other acute osteomyelitis, left ankle and foot: Secondary | ICD-10-CM | POA: Diagnosis not present

## 2017-07-24 DIAGNOSIS — B9561 Methicillin susceptible Staphylococcus aureus infection as the cause of diseases classified elsewhere: Secondary | ICD-10-CM | POA: Diagnosis not present

## 2017-07-24 DIAGNOSIS — F419 Anxiety disorder, unspecified: Secondary | ICD-10-CM | POA: Diagnosis not present

## 2017-07-25 DIAGNOSIS — Z89412 Acquired absence of left great toe: Secondary | ICD-10-CM | POA: Diagnosis not present

## 2017-07-25 DIAGNOSIS — B9561 Methicillin susceptible Staphylococcus aureus infection as the cause of diseases classified elsewhere: Secondary | ICD-10-CM | POA: Diagnosis not present

## 2017-07-25 DIAGNOSIS — G7100 Muscular dystrophy, unspecified: Secondary | ICD-10-CM | POA: Diagnosis not present

## 2017-07-25 DIAGNOSIS — I2510 Atherosclerotic heart disease of native coronary artery without angina pectoris: Secondary | ICD-10-CM | POA: Diagnosis not present

## 2017-07-25 DIAGNOSIS — F329 Major depressive disorder, single episode, unspecified: Secondary | ICD-10-CM | POA: Diagnosis not present

## 2017-07-25 DIAGNOSIS — F419 Anxiety disorder, unspecified: Secondary | ICD-10-CM | POA: Diagnosis not present

## 2017-07-25 DIAGNOSIS — Z4781 Encounter for orthopedic aftercare following surgical amputation: Secondary | ICD-10-CM | POA: Diagnosis not present

## 2017-07-25 DIAGNOSIS — E1169 Type 2 diabetes mellitus with other specified complication: Secondary | ICD-10-CM | POA: Diagnosis not present

## 2017-07-25 DIAGNOSIS — M86172 Other acute osteomyelitis, left ankle and foot: Secondary | ICD-10-CM | POA: Diagnosis not present

## 2017-07-26 ENCOUNTER — Ambulatory Visit (INDEPENDENT_AMBULATORY_CARE_PROVIDER_SITE_OTHER): Payer: Medicare HMO | Admitting: Sports Medicine

## 2017-07-26 ENCOUNTER — Ambulatory Visit (INDEPENDENT_AMBULATORY_CARE_PROVIDER_SITE_OTHER): Payer: Medicare HMO

## 2017-07-26 ENCOUNTER — Encounter: Payer: Self-pay | Admitting: Sports Medicine

## 2017-07-26 ENCOUNTER — Other Ambulatory Visit: Payer: Self-pay | Admitting: Sports Medicine

## 2017-07-26 DIAGNOSIS — I739 Peripheral vascular disease, unspecified: Secondary | ICD-10-CM

## 2017-07-26 DIAGNOSIS — M869 Osteomyelitis, unspecified: Secondary | ICD-10-CM

## 2017-07-26 DIAGNOSIS — Z9889 Other specified postprocedural states: Secondary | ICD-10-CM

## 2017-07-26 DIAGNOSIS — E1140 Type 2 diabetes mellitus with diabetic neuropathy, unspecified: Secondary | ICD-10-CM

## 2017-07-26 NOTE — Progress Notes (Signed)
Subjective: OSHAY [name] is a 84 y.o. male patient seen today in office for POV #1 (DOS 07-18-17), S/P Left hallux + distal metatarsal amputation. Patient denies pain at surgical site, denies calf pain, denies headache, chest pain, shortness of breath, nausea, vomiting, fever, or chills. Patient states that she is doing well and is taking antibiotics and pain medication as needed. No other issues noted.   Patient is assisted by daughter this visit.   Patient Active Problem List   Diagnosis Date Noted  . Abnormal breath sounds 03/05/2017  . Chronic pain 02/05/2017  . Acute diastolic CHF (congestive heart failure) ([place]) 12/04/2016  . S/P shoulder replacement, left 11/16/2016  . Preoperative clearance 10/15/2016  . Hyperlipidemia 10/15/2016  . Diabetes ([place]) 03/10/2015  . Perennial and seasonal allergic rhinitis 12/20/2014  . Hematuria 12/20/2014  . Loss of weight 05/06/2014  . Other pancytopenia ([place]) 05/05/2014  . CAP (community acquired pneumonia) 05/05/2014  . Hypocalcemia 05/05/2014  . Skin lesion 04/30/2014  . 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 Abdominal pain, right upper quadrant 05/04/2013  . Cold intolerance 05/04/2013  . Low back pain 04/03/2012  . Encounter for long-term (current) use of other medications 12/11/2011  . Routine general medical examination at a health care facility 12/17/2010  . Screening for prostate cancer 12/12/2010  . Osteoarthrosis, unspecified whether generalized or localized, unspecified site 10/24/2010  . SKIN RASH 06/29/2009  . Iron deficiency anemia 03/10/2009  . BACK PAIN, LUMBAR 03/10/2009  . [place] DISEASE 05/03/2008  . DYSPNEA 05/03/2008  . DEPRESSIVE DISORDER NOT ELSEWHERE CLASSIFIED 05/22/2007  . CHEST PAIN 05/22/2007  . ANEMIA, PERNICIOUS 12/02/2006  . Essential hypertension 07/26/2006  . Coronary atherosclerosis 07/26/2006  . GERD 07/26/2006    Current Outpatient Medications on File Prior to Visit   Medication Sig Dispense Refill  . ACCU-CHEK SOFTCLIX LANCETS lancets Used to check blood sugars daily. 100 each 12  . acetaminophen (TYLENOL) 500 MG tablet Take 1,000-1,500 mg 2 (two) times daily as needed by mouth for moderate pain.    . Alcohol Swabs (B-D SINGLE USE SWABS REGULAR) PADS Used to check blood sugars daily. 100 each 11  . Ascorbic Acid (VITAMIN C) 500 MG tablet Take 500 mg by mouth 2 (two) times daily.     Marland Kitchen aspirin (GOODSENSE ASPIRIN) 325 MG tablet Take 325 mg daily by mouth.     . Blood Glucose Calibration (ACCU-CHEK AVIVA) SOLN 1 Bottle by In Vitro route as needed. 1 each 0  . Blood Glucose Monitoring Suppl (ACCU-CHEK AVIVA) device Use as instructed 1 each 0  . cholecalciferol (VITAMIN D) 1000 units tablet Take 1,000 Units at bedtime by mouth.    . Cyanocobalamin (B-12 PO) Take 1 tablet daily by mouth.    . Cyanocobalamin (VITAMIN B-12 IJ) GET A B12 INJECTION ONCE EVERY MONTH     . docusate sodium (COLACE) 100 MG capsule Take 1 capsule (100 mg total) by mouth daily as needed. 30 capsule 2  . ferrous sulfate 325 (65 FE) MG tablet Take 325 mg 2 (two) times daily by mouth.    . gabapentin (NEURONTIN) 300 MG capsule Take 2 capsules (600 mg total) by mouth at bedtime. 180 capsule 3  . glucose blood (ACCU-CHEK AVIVA) test strip Used to check blood sugars daily. 100 each 12  . Inositol Niacinate (NIACIN FLUSH FREE) 500 MG CAPS Take 500 mg at bedtime by mouth.     Elmore Guise Devices (SIMPLE DIAGNOSTICS LANCING DEV) MISC   1  . metFORMIN (  GLUCOPHAGE-XR) 500 MG 24 hr tablet Take 500 mg by mouth 2 (two) times daily.    . montelukast (SINGULAIR) 10 MG tablet TAKE 1 TABLET AT BEDTIME 90 tablet 3  . Multiple Vitamins-Minerals (MULTIVITAMIN WITH MINERALS) tablet Take 1 tablet by mouth daily.      . promethazine (PHENERGAN) 12.5 MG tablet Take 1 tablet (12.5 mg total) by mouth every 6 (six) hours as needed for nausea or vomiting. 30 tablet 0  . sertraline (ZOLOFT) 50 MG tablet TAKE 1 TABLET  EVERY DAY 90 tablet 3  . simvastatin (ZOCOR) 80 MG tablet Take 1 tablet (80 mg total) by mouth at bedtime. 90 tablet 3  . sulfamethoxazole-trimethoprim (BACTRIM DS,SEPTRA DS) 800-160 MG tablet Take 1 tablet by mouth 2 (two) times daily. 20 tablet 0  . sulfamethoxazole-trimethoprim (BACTRIM DS,SEPTRA DS) 800-160 MG tablet Take 1 tablet by mouth 2 (two) times daily. 14 tablet 0  . traMADol (ULTRAM) 50 MG tablet Take 1 tablet (50 mg total) by mouth every 12 (twelve) hours as needed. 20 tablet 0  . Zinc 50 MG CAPS Take 50 mg at bedtime by mouth.      No current facility-administered medications on file prior to visit.     No Known Allergies  Objective: There were no vitals filed for this visit.  General: No acute distress, AAOx3  Left foot: Sutures intact with no gapping or dehiscence at surgical site, mild swelling to left forefoot, decreased erythema, no warmth, no drainage, no other signs of infection noted, Capillary fill time <5 seconds in all remaining digits, no pain to palpation left foot, s/p 1st hallux + distal met amp,  No pain with calf compression.   Post Op Xray, Left foot: s/p 1st toe and distal met amp. Soft tissue swelling within normal limits for post op status.   Assessment and Plan:  Problem List Items Addressed This Visit    None    Visit Diagnoses    Post-operative state    -  Primary   Relevant Orders   DG Foot Complete Left   Osteomyelitis of left foot, unspecified type (HCC)       S/P foot surgery, left       Type 2 diabetes, controlled, with neuropathy (HCC)       PVD (peripheral vascular disease) ([place])           -Patient seen and evaluated -Xrays reviewed  -Applied dry sterile dressing to surgical site left foot secured with ACE wrap and stockinet  -Advised patient to make sure to keep dressings clean, dry, and intact to left surgical site, removing the ACE as needed  -Advised patient to continue with electric chair and partial wb for transfers  only -Continue with PO antibiotics and pain meds PRN -Advised patient to limit activity to necessity  -Advised patient to elevate as necessary  -Will plan for incision check at next office visit. In the meantime, patient to call office if any issues or problems arise.   Landis Martins, DPM

## 2017-07-30 DIAGNOSIS — F329 Major depressive disorder, single episode, unspecified: Secondary | ICD-10-CM | POA: Diagnosis not present

## 2017-07-30 DIAGNOSIS — M86172 Other acute osteomyelitis, left ankle and foot: Secondary | ICD-10-CM | POA: Diagnosis not present

## 2017-07-30 DIAGNOSIS — Z89412 Acquired absence of left great toe: Secondary | ICD-10-CM | POA: Diagnosis not present

## 2017-07-30 DIAGNOSIS — E1169 Type 2 diabetes mellitus with other specified complication: Secondary | ICD-10-CM | POA: Diagnosis not present

## 2017-07-30 DIAGNOSIS — B9561 Methicillin susceptible Staphylococcus aureus infection as the cause of diseases classified elsewhere: Secondary | ICD-10-CM | POA: Diagnosis not present

## 2017-07-30 DIAGNOSIS — Z4781 Encounter for orthopedic aftercare following surgical amputation: Secondary | ICD-10-CM | POA: Diagnosis not present

## 2017-07-30 DIAGNOSIS — F419 Anxiety disorder, unspecified: Secondary | ICD-10-CM | POA: Diagnosis not present

## 2017-07-30 DIAGNOSIS — I2510 Atherosclerotic heart disease of native coronary artery without angina pectoris: Secondary | ICD-10-CM | POA: Diagnosis not present

## 2017-07-30 DIAGNOSIS — G7100 Muscular dystrophy, unspecified: Secondary | ICD-10-CM | POA: Diagnosis not present

## 2017-07-31 DIAGNOSIS — F419 Anxiety disorder, unspecified: Secondary | ICD-10-CM | POA: Diagnosis not present

## 2017-07-31 DIAGNOSIS — I2510 Atherosclerotic heart disease of native coronary artery without angina pectoris: Secondary | ICD-10-CM | POA: Diagnosis not present

## 2017-07-31 DIAGNOSIS — M86172 Other acute osteomyelitis, left ankle and foot: Secondary | ICD-10-CM | POA: Diagnosis not present

## 2017-07-31 DIAGNOSIS — E1169 Type 2 diabetes mellitus with other specified complication: Secondary | ICD-10-CM | POA: Diagnosis not present

## 2017-07-31 DIAGNOSIS — Z89412 Acquired absence of left great toe: Secondary | ICD-10-CM | POA: Diagnosis not present

## 2017-07-31 DIAGNOSIS — Z4781 Encounter for orthopedic aftercare following surgical amputation: Secondary | ICD-10-CM | POA: Diagnosis not present

## 2017-07-31 DIAGNOSIS — F329 Major depressive disorder, single episode, unspecified: Secondary | ICD-10-CM | POA: Diagnosis not present

## 2017-07-31 DIAGNOSIS — B9561 Methicillin susceptible Staphylococcus aureus infection as the cause of diseases classified elsewhere: Secondary | ICD-10-CM | POA: Diagnosis not present

## 2017-07-31 DIAGNOSIS — G7100 Muscular dystrophy, unspecified: Secondary | ICD-10-CM | POA: Diagnosis not present

## 2017-08-01 DIAGNOSIS — F419 Anxiety disorder, unspecified: Secondary | ICD-10-CM | POA: Diagnosis not present

## 2017-08-01 DIAGNOSIS — E1169 Type 2 diabetes mellitus with other specified complication: Secondary | ICD-10-CM | POA: Diagnosis not present

## 2017-08-01 DIAGNOSIS — B9561 Methicillin susceptible Staphylococcus aureus infection as the cause of diseases classified elsewhere: Secondary | ICD-10-CM | POA: Diagnosis not present

## 2017-08-01 DIAGNOSIS — Z4781 Encounter for orthopedic aftercare following surgical amputation: Secondary | ICD-10-CM | POA: Diagnosis not present

## 2017-08-01 DIAGNOSIS — Z89412 Acquired absence of left great toe: Secondary | ICD-10-CM | POA: Diagnosis not present

## 2017-08-01 DIAGNOSIS — M86172 Other acute osteomyelitis, left ankle and foot: Secondary | ICD-10-CM | POA: Diagnosis not present

## 2017-08-01 DIAGNOSIS — I2510 Atherosclerotic heart disease of native coronary artery without angina pectoris: Secondary | ICD-10-CM | POA: Diagnosis not present

## 2017-08-01 DIAGNOSIS — F329 Major depressive disorder, single episode, unspecified: Secondary | ICD-10-CM | POA: Diagnosis not present

## 2017-08-01 DIAGNOSIS — G7100 Muscular dystrophy, unspecified: Secondary | ICD-10-CM | POA: Diagnosis not present

## 2017-08-02 ENCOUNTER — Ambulatory Visit (INDEPENDENT_AMBULATORY_CARE_PROVIDER_SITE_OTHER): Payer: Medicare HMO | Admitting: Sports Medicine

## 2017-08-02 ENCOUNTER — Encounter: Payer: Self-pay | Admitting: Sports Medicine

## 2017-08-02 VITALS — BP 113/93 | HR 61 | Temp 98.0°F | Resp 16

## 2017-08-02 DIAGNOSIS — E1169 Type 2 diabetes mellitus with other specified complication: Secondary | ICD-10-CM | POA: Diagnosis not present

## 2017-08-02 DIAGNOSIS — Z9889 Other specified postprocedural states: Secondary | ICD-10-CM

## 2017-08-02 DIAGNOSIS — B9561 Methicillin susceptible Staphylococcus aureus infection as the cause of diseases classified elsewhere: Secondary | ICD-10-CM | POA: Diagnosis not present

## 2017-08-02 DIAGNOSIS — G7100 Muscular dystrophy, unspecified: Secondary | ICD-10-CM | POA: Diagnosis not present

## 2017-08-02 DIAGNOSIS — I2510 Atherosclerotic heart disease of native coronary artery without angina pectoris: Secondary | ICD-10-CM | POA: Diagnosis not present

## 2017-08-02 DIAGNOSIS — F329 Major depressive disorder, single episode, unspecified: Secondary | ICD-10-CM | POA: Diagnosis not present

## 2017-08-02 DIAGNOSIS — F419 Anxiety disorder, unspecified: Secondary | ICD-10-CM | POA: Diagnosis not present

## 2017-08-02 DIAGNOSIS — Z89412 Acquired absence of left great toe: Secondary | ICD-10-CM | POA: Diagnosis not present

## 2017-08-02 DIAGNOSIS — I739 Peripheral vascular disease, unspecified: Secondary | ICD-10-CM

## 2017-08-02 DIAGNOSIS — Z4781 Encounter for orthopedic aftercare following surgical amputation: Secondary | ICD-10-CM | POA: Diagnosis not present

## 2017-08-02 DIAGNOSIS — E1140 Type 2 diabetes mellitus with diabetic neuropathy, unspecified: Secondary | ICD-10-CM

## 2017-08-02 DIAGNOSIS — M86172 Other acute osteomyelitis, left ankle and foot: Secondary | ICD-10-CM | POA: Diagnosis not present

## 2017-08-02 DIAGNOSIS — M869 Osteomyelitis, unspecified: Secondary | ICD-10-CM

## 2017-08-02 NOTE — Progress Notes (Signed)
Subjective: [name] is a 84 y.o. male patient seen today in office for POV #2 (DOS 07-18-17), S/P Left hallux + distal metatarsal amputation. Patient denies pain at surgical site, denies calf pain, denies headache, chest pain, shortness of breath, nausea, vomiting, fever, or chills. Patient states that he is doing all right and is taking antibiotics and pain medication as needed. No other issues noted.   Patient is assisted by daughters this visit.   Patient Active Problem List   Diagnosis Date Noted  . Abnormal breath sounds 03/05/2017  . Chronic pain 02/05/2017  . Acute diastolic CHF (congestive heart failure) ([place]) 12/04/2016  . S/P shoulder replacement, left 11/16/2016  . Preoperative clearance 10/15/2016  . Hyperlipidemia 10/15/2016  . Diabetes ([place]) 03/10/2015  . Perennial and seasonal allergic rhinitis 12/20/2014  . Hematuria 12/20/2014  . Loss of weight 05/06/2014  . Other pancytopenia ([place]) 05/05/2014  . CAP (community acquired pneumonia) 05/05/2014  . Hypocalcemia 05/05/2014  . Skin lesion 04/30/2014  . 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 Abdominal pain, right upper quadrant 05/04/2013  . Cold intolerance 05/04/2013  . Low back pain 04/03/2012  . Encounter for long-term (current) use of other medications 12/11/2011  . Routine general medical examination at a health care facility 12/17/2010  . Screening for prostate cancer 12/12/2010  . Osteoarthrosis, unspecified whether generalized or localized, unspecified site 10/24/2010  . SKIN RASH 06/29/2009  . Iron deficiency anemia 03/10/2009  . BACK PAIN, LUMBAR 03/10/2009  . [place] DISEASE 05/03/2008  . DYSPNEA 05/03/2008  . DEPRESSIVE DISORDER NOT ELSEWHERE CLASSIFIED 05/22/2007  . CHEST PAIN 05/22/2007  . ANEMIA, PERNICIOUS 12/02/2006  . Essential hypertension 07/26/2006  . Coronary atherosclerosis 07/26/2006  . GERD 07/26/2006    Current Outpatient Medications on File Prior to Visit   Medication Sig Dispense Refill  . ACCU-CHEK SOFTCLIX LANCETS lancets Used to check blood sugars daily. 100 each 12  . acetaminophen (TYLENOL) 500 MG tablet Take 1,000-1,500 mg 2 (two) times daily as needed by mouth for moderate pain.    . Alcohol Swabs (B-D SINGLE USE SWABS REGULAR) PADS Used to check blood sugars daily. 100 each 11  . Ascorbic Acid (VITAMIN C) 500 MG tablet Take 500 mg by mouth 2 (two) times daily.     Marland Kitchen aspirin (GOODSENSE ASPIRIN) 325 MG tablet Take 325 mg daily by mouth.     . Blood Glucose Calibration (ACCU-CHEK AVIVA) SOLN 1 Bottle by In Vitro route as needed. 1 each 0  . Blood Glucose Monitoring Suppl (ACCU-CHEK AVIVA) device Use as instructed 1 each 0  . cholecalciferol (VITAMIN D) 1000 units tablet Take 1,000 Units at bedtime by mouth.    . Cyanocobalamin (B-12 PO) Take 1 tablet daily by mouth.    . Cyanocobalamin (VITAMIN B-12 IJ) GET A B12 INJECTION ONCE EVERY MONTH     . docusate sodium (COLACE) 100 MG capsule Take 1 capsule (100 mg total) by mouth daily as needed. 30 capsule 2  . ferrous sulfate 325 (65 FE) MG tablet Take 325 mg 2 (two) times daily by mouth.    . gabapentin (NEURONTIN) 300 MG capsule Take 2 capsules (600 mg total) by mouth at bedtime. 180 capsule 3  . glucose blood (ACCU-CHEK AVIVA) test strip Used to check blood sugars daily. 100 each 12  . Inositol Niacinate (NIACIN FLUSH FREE) 500 MG CAPS Take 500 mg at bedtime by mouth.     Elmore Guise Devices (SIMPLE DIAGNOSTICS LANCING DEV) MISC   1  .  metFORMIN (GLUCOPHAGE-XR) 500 MG 24 hr tablet Take 500 mg by mouth 2 (two) times daily.    . montelukast (SINGULAIR) 10 MG tablet TAKE 1 TABLET AT BEDTIME 90 tablet 3  . Multiple Vitamins-Minerals (MULTIVITAMIN WITH MINERALS) tablet Take 1 tablet by mouth daily.      . promethazine (PHENERGAN) 12.5 MG tablet Take 1 tablet (12.5 mg total) by mouth every 6 (six) hours as needed for nausea or vomiting. 30 tablet 0  . sertraline (ZOLOFT) 50 MG tablet TAKE 1 TABLET  EVERY DAY 90 tablet 3  . simvastatin (ZOCOR) 80 MG tablet Take 1 tablet (80 mg total) by mouth at bedtime. 90 tablet 3  . sulfamethoxazole-trimethoprim (BACTRIM DS,SEPTRA DS) 800-160 MG tablet Take 1 tablet by mouth 2 (two) times daily. 20 tablet 0  . sulfamethoxazole-trimethoprim (BACTRIM DS,SEPTRA DS) 800-160 MG tablet Take 1 tablet by mouth 2 (two) times daily. 14 tablet 0  . traMADol (ULTRAM) 50 MG tablet Take 1 tablet (50 mg total) by mouth every 12 (twelve) hours as needed. 20 tablet 0  . Zinc 50 MG CAPS Take 50 mg at bedtime by mouth.      No current facility-administered medications on file prior to visit.     No Known Allergies  Objective: There were no vitals filed for this visit.  General: No acute distress, AAOx3  Left foot: Sutures intact with no gapping or dehiscence at surgical site, mild swelling to left forefoot, decreased erythema, no warmth, no drainage, no other signs of infection noted, Capillary fill time <5 seconds in all remaining digits, no pain to palpation left foot, s/p 1st hallux + distal met amp,  No pain with calf compression.   Assessment and Plan:  Problem List Items Addressed This Visit    None    Visit Diagnoses    Post-operative state    -  Primary   Osteomyelitis of left foot, unspecified type (HCC)       S/P foot surgery, left       Type 2 diabetes, controlled, with neuropathy (HCC)       PVD (peripheral vascular disease) ([place])           -Patient seen and evaluated -Incision site checked -Applied dry sterile dressing to surgical site left foot secured with ACE wrap and stockinet  -Advised patient to make sure to keep dressings clean, dry, and intact to left surgical site, removing the ACE as needed  -Advised patient to continue with electric chair and partial wb for transfers only until next visit -Continue with PO antibiotics and pain meds PRN -Advised patient to limit activity to necessity  -Advised patient to elevate as necessary  -Will  plan for incision check and removing a few sutures at next office visit. In the meantime, patient to call office if any issues or problems arise.   Landis Martins, DPM

## 2017-08-05 DIAGNOSIS — J9610 Chronic respiratory failure, unspecified whether with hypoxia or hypercapnia: Secondary | ICD-10-CM | POA: Diagnosis not present

## 2017-08-05 DIAGNOSIS — G600 Hereditary motor and sensory neuropathy: Secondary | ICD-10-CM | POA: Diagnosis not present

## 2017-08-06 DIAGNOSIS — Z4781 Encounter for orthopedic aftercare following surgical amputation: Secondary | ICD-10-CM | POA: Diagnosis not present

## 2017-08-06 DIAGNOSIS — B9561 Methicillin susceptible Staphylococcus aureus infection as the cause of diseases classified elsewhere: Secondary | ICD-10-CM | POA: Diagnosis not present

## 2017-08-06 DIAGNOSIS — G7100 Muscular dystrophy, unspecified: Secondary | ICD-10-CM | POA: Diagnosis not present

## 2017-08-06 DIAGNOSIS — M86172 Other acute osteomyelitis, left ankle and foot: Secondary | ICD-10-CM | POA: Diagnosis not present

## 2017-08-06 DIAGNOSIS — F419 Anxiety disorder, unspecified: Secondary | ICD-10-CM | POA: Diagnosis not present

## 2017-08-06 DIAGNOSIS — Z89412 Acquired absence of left great toe: Secondary | ICD-10-CM | POA: Diagnosis not present

## 2017-08-06 DIAGNOSIS — F329 Major depressive disorder, single episode, unspecified: Secondary | ICD-10-CM | POA: Diagnosis not present

## 2017-08-06 DIAGNOSIS — I2510 Atherosclerotic heart disease of native coronary artery without angina pectoris: Secondary | ICD-10-CM | POA: Diagnosis not present

## 2017-08-06 DIAGNOSIS — E1169 Type 2 diabetes mellitus with other specified complication: Secondary | ICD-10-CM | POA: Diagnosis not present

## 2017-08-07 DIAGNOSIS — F419 Anxiety disorder, unspecified: Secondary | ICD-10-CM | POA: Diagnosis not present

## 2017-08-07 DIAGNOSIS — E1169 Type 2 diabetes mellitus with other specified complication: Secondary | ICD-10-CM | POA: Diagnosis not present

## 2017-08-07 DIAGNOSIS — I2510 Atherosclerotic heart disease of native coronary artery without angina pectoris: Secondary | ICD-10-CM | POA: Diagnosis not present

## 2017-08-07 DIAGNOSIS — F329 Major depressive disorder, single episode, unspecified: Secondary | ICD-10-CM | POA: Diagnosis not present

## 2017-08-07 DIAGNOSIS — M86172 Other acute osteomyelitis, left ankle and foot: Secondary | ICD-10-CM | POA: Diagnosis not present

## 2017-08-07 DIAGNOSIS — B9561 Methicillin susceptible Staphylococcus aureus infection as the cause of diseases classified elsewhere: Secondary | ICD-10-CM | POA: Diagnosis not present

## 2017-08-07 DIAGNOSIS — G7100 Muscular dystrophy, unspecified: Secondary | ICD-10-CM | POA: Diagnosis not present

## 2017-08-07 DIAGNOSIS — Z89412 Acquired absence of left great toe: Secondary | ICD-10-CM | POA: Diagnosis not present

## 2017-08-07 DIAGNOSIS — Z4781 Encounter for orthopedic aftercare following surgical amputation: Secondary | ICD-10-CM | POA: Diagnosis not present

## 2017-08-08 DIAGNOSIS — E1169 Type 2 diabetes mellitus with other specified complication: Secondary | ICD-10-CM | POA: Diagnosis not present

## 2017-08-08 DIAGNOSIS — Z4781 Encounter for orthopedic aftercare following surgical amputation: Secondary | ICD-10-CM | POA: Diagnosis not present

## 2017-08-08 DIAGNOSIS — F419 Anxiety disorder, unspecified: Secondary | ICD-10-CM | POA: Diagnosis not present

## 2017-08-08 DIAGNOSIS — B9561 Methicillin susceptible Staphylococcus aureus infection as the cause of diseases classified elsewhere: Secondary | ICD-10-CM | POA: Diagnosis not present

## 2017-08-08 DIAGNOSIS — I2510 Atherosclerotic heart disease of native coronary artery without angina pectoris: Secondary | ICD-10-CM | POA: Diagnosis not present

## 2017-08-08 DIAGNOSIS — Z89412 Acquired absence of left great toe: Secondary | ICD-10-CM | POA: Diagnosis not present

## 2017-08-08 DIAGNOSIS — M86172 Other acute osteomyelitis, left ankle and foot: Secondary | ICD-10-CM | POA: Diagnosis not present

## 2017-08-08 DIAGNOSIS — F329 Major depressive disorder, single episode, unspecified: Secondary | ICD-10-CM | POA: Diagnosis not present

## 2017-08-08 DIAGNOSIS — G7100 Muscular dystrophy, unspecified: Secondary | ICD-10-CM | POA: Diagnosis not present

## 2017-08-09 ENCOUNTER — Encounter: Payer: Self-pay | Admitting: Sports Medicine

## 2017-08-09 ENCOUNTER — Ambulatory Visit (INDEPENDENT_AMBULATORY_CARE_PROVIDER_SITE_OTHER): Payer: Medicare HMO | Admitting: Sports Medicine

## 2017-08-09 VITALS — BP 135/71 | HR 58 | Resp 15

## 2017-08-09 DIAGNOSIS — Z9889 Other specified postprocedural states: Secondary | ICD-10-CM

## 2017-08-09 DIAGNOSIS — I739 Peripheral vascular disease, unspecified: Secondary | ICD-10-CM

## 2017-08-09 DIAGNOSIS — E1140 Type 2 diabetes mellitus with diabetic neuropathy, unspecified: Secondary | ICD-10-CM

## 2017-08-09 NOTE — Progress Notes (Signed)
Subjective: [name] is a 84 y.o. male patient seen today in office for POV #3 (DOS 07-18-17), S/P Left hallux + distal metatarsal amputation. Patient denies pain at surgical site, denies calf pain, denies headache, chest pain, shortness of breath, nausea, vomiting, fever, or chills. Patient states that he is doing good.  No other issues noted.   Patient is assisted by daughter this visit.   Patient Active Problem List   Diagnosis Date Noted  . Abnormal breath sounds 03/05/2017  . Chronic pain 02/05/2017  . Acute diastolic CHF (congestive heart failure) ([place]) 12/04/2016  . S/P shoulder replacement, left 11/16/2016  . Preoperative clearance 10/15/2016  . Hyperlipidemia 10/15/2016  . Diabetes ([place]) 03/10/2015  . Perennial and seasonal allergic rhinitis 12/20/2014  . Hematuria 12/20/2014  . Loss of weight 05/06/2014  . Other pancytopenia ([place]) 05/05/2014  . CAP (community acquired pneumonia) 05/05/2014  . Hypocalcemia 05/05/2014  . Skin lesion 04/30/2014  . 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 Abdominal pain, right upper quadrant 05/04/2013  . Cold intolerance 05/04/2013  . Low back pain 04/03/2012  . Encounter for long-term (current) use of other medications 12/11/2011  . Routine general medical examination at a health care facility 12/17/2010  . Screening for prostate cancer 12/12/2010  . Osteoarthrosis, unspecified whether generalized or localized, unspecified site 10/24/2010  . SKIN RASH 06/29/2009  . Iron deficiency anemia 03/10/2009  . BACK PAIN, LUMBAR 03/10/2009  . [place] DISEASE 05/03/2008  . DYSPNEA 05/03/2008  . DEPRESSIVE DISORDER NOT ELSEWHERE CLASSIFIED 05/22/2007  . CHEST PAIN 05/22/2007  . ANEMIA, PERNICIOUS 12/02/2006  . Essential hypertension 07/26/2006  . Coronary atherosclerosis 07/26/2006  . GERD 07/26/2006    Current Outpatient Medications on File Prior to Visit  Medication Sig Dispense Refill  . ACCU-CHEK SOFTCLIX  LANCETS lancets Used to check blood sugars daily. 100 each 12  . acetaminophen (TYLENOL) 500 MG tablet Take 1,000-1,500 mg 2 (two) times daily as needed by mouth for moderate pain.    . Alcohol Swabs (B-D SINGLE USE SWABS REGULAR) PADS Used to check blood sugars daily. 100 each 11  . Ascorbic Acid (VITAMIN C) 500 MG tablet Take 500 mg by mouth 2 (two) times daily.     Marland Kitchen aspirin (GOODSENSE ASPIRIN) 325 MG tablet Take 325 mg daily by mouth.     . Blood Glucose Calibration (ACCU-CHEK AVIVA) SOLN 1 Bottle by In Vitro route as needed. 1 each 0  . Blood Glucose Monitoring Suppl (ACCU-CHEK AVIVA) device Use as instructed 1 each 0  . cholecalciferol (VITAMIN D) 1000 units tablet Take 1,000 Units at bedtime by mouth.    . Cyanocobalamin (B-12 PO) Take 1 tablet daily by mouth.    . Cyanocobalamin (VITAMIN B-12 IJ) GET A B12 INJECTION ONCE EVERY MONTH     . docusate sodium (COLACE) 100 MG capsule Take 1 capsule (100 mg total) by mouth daily as needed. 30 capsule 2  . ferrous sulfate 325 (65 FE) MG tablet Take 325 mg 2 (two) times daily by mouth.    . gabapentin (NEURONTIN) 300 MG capsule Take 2 capsules (600 mg total) by mouth at bedtime. 180 capsule 3  . glucose blood (ACCU-CHEK AVIVA) test strip Used to check blood sugars daily. 100 each 12  . Inositol Niacinate (NIACIN FLUSH FREE) 500 MG CAPS Take 500 mg at bedtime by mouth.     Elmore Guise Devices (SIMPLE DIAGNOSTICS LANCING DEV) MISC   1  . metFORMIN (GLUCOPHAGE-XR) 500 MG 24 hr tablet Take 500  mg by mouth 2 (two) times daily.    . montelukast (SINGULAIR) 10 MG tablet TAKE 1 TABLET AT BEDTIME 90 tablet 3  . Multiple Vitamins-Minerals (MULTIVITAMIN WITH MINERALS) tablet Take 1 tablet by mouth daily.      . promethazine (PHENERGAN) 12.5 MG tablet Take 1 tablet (12.5 mg total) by mouth every 6 (six) hours as needed for nausea or vomiting. 30 tablet 0  . sertraline (ZOLOFT) 50 MG tablet TAKE 1 TABLET EVERY DAY 90 tablet 3  . simvastatin (ZOCOR) 80 MG tablet  Take 1 tablet (80 mg total) by mouth at bedtime. 90 tablet 3  . sulfamethoxazole-trimethoprim (BACTRIM DS,SEPTRA DS) 800-160 MG tablet Take 1 tablet by mouth 2 (two) times daily. 20 tablet 0  . sulfamethoxazole-trimethoprim (BACTRIM DS,SEPTRA DS) 800-160 MG tablet Take 1 tablet by mouth 2 (two) times daily. 14 tablet 0  . traMADol (ULTRAM) 50 MG tablet Take 1 tablet (50 mg total) by mouth every 12 (twelve) hours as needed. 20 tablet 0  . Zinc 50 MG CAPS Take 50 mg at bedtime by mouth.      No current facility-administered medications on file prior to visit.     No Known Allergies  Objective: There were no vitals filed for this visit.  General: No acute distress, AAOx3  Left foot: Sutures intact with no gapping or dehiscence at surgical site, mild swelling to left forefoot, decreased erythema, no warmth, no drainage, no other signs of infection noted, Capillary fill time <5 seconds in all remaining digits, no pain to palpation left foot, s/p 1st hallux + distal met amp,  No pain with calf compression.   Assessment and Plan:  Problem List Items Addressed This Visit    None    Visit Diagnoses    Post-operative state    -  Primary   Type 2 diabetes, controlled, with neuropathy ([place])       PVD (peripheral vascular disease) ([place])           -Patient seen and evaluated -Incision site checked and every other suture removed. -Applied dry sterile dressing to surgical site left foot secured with ACE wrap and stockinet  -Advised patient to make sure to keep dressings clean, dry, and intact to left surgical site, removing the ACE as needed  -Advised patient to continue with electric chair and partial wb for transfers only until next visit and might consider cam boot once more stitches are removed to see how the area is continuing to heal -Oral antibiotics completed -Advised patient to limit activity to necessity  -Advised patient to elevate as necessary  -Will plan for incision check and  removing a few more sutures at next office visit. In the meantime, patient to call office if any issues or problems arise.   Landis Martins, DPM

## 2017-08-12 DIAGNOSIS — E1169 Type 2 diabetes mellitus with other specified complication: Secondary | ICD-10-CM | POA: Diagnosis not present

## 2017-08-12 DIAGNOSIS — B9561 Methicillin susceptible Staphylococcus aureus infection as the cause of diseases classified elsewhere: Secondary | ICD-10-CM | POA: Diagnosis not present

## 2017-08-12 DIAGNOSIS — G7100 Muscular dystrophy, unspecified: Secondary | ICD-10-CM | POA: Diagnosis not present

## 2017-08-12 DIAGNOSIS — F329 Major depressive disorder, single episode, unspecified: Secondary | ICD-10-CM | POA: Diagnosis not present

## 2017-08-12 DIAGNOSIS — M86172 Other acute osteomyelitis, left ankle and foot: Secondary | ICD-10-CM | POA: Diagnosis not present

## 2017-08-12 DIAGNOSIS — I2510 Atherosclerotic heart disease of native coronary artery without angina pectoris: Secondary | ICD-10-CM | POA: Diagnosis not present

## 2017-08-12 DIAGNOSIS — Z89412 Acquired absence of left great toe: Secondary | ICD-10-CM | POA: Diagnosis not present

## 2017-08-12 DIAGNOSIS — Z4781 Encounter for orthopedic aftercare following surgical amputation: Secondary | ICD-10-CM | POA: Diagnosis not present

## 2017-08-12 DIAGNOSIS — F419 Anxiety disorder, unspecified: Secondary | ICD-10-CM | POA: Diagnosis not present

## 2017-08-13 DIAGNOSIS — F419 Anxiety disorder, unspecified: Secondary | ICD-10-CM | POA: Diagnosis not present

## 2017-08-13 DIAGNOSIS — Z89412 Acquired absence of left great toe: Secondary | ICD-10-CM | POA: Diagnosis not present

## 2017-08-13 DIAGNOSIS — F329 Major depressive disorder, single episode, unspecified: Secondary | ICD-10-CM | POA: Diagnosis not present

## 2017-08-13 DIAGNOSIS — B9561 Methicillin susceptible Staphylococcus aureus infection as the cause of diseases classified elsewhere: Secondary | ICD-10-CM | POA: Diagnosis not present

## 2017-08-13 DIAGNOSIS — E1169 Type 2 diabetes mellitus with other specified complication: Secondary | ICD-10-CM | POA: Diagnosis not present

## 2017-08-13 DIAGNOSIS — I2510 Atherosclerotic heart disease of native coronary artery without angina pectoris: Secondary | ICD-10-CM | POA: Diagnosis not present

## 2017-08-13 DIAGNOSIS — G7100 Muscular dystrophy, unspecified: Secondary | ICD-10-CM | POA: Diagnosis not present

## 2017-08-13 DIAGNOSIS — Z4781 Encounter for orthopedic aftercare following surgical amputation: Secondary | ICD-10-CM | POA: Diagnosis not present

## 2017-08-13 DIAGNOSIS — M86172 Other acute osteomyelitis, left ankle and foot: Secondary | ICD-10-CM | POA: Diagnosis not present

## 2017-08-16 ENCOUNTER — Ambulatory Visit (INDEPENDENT_AMBULATORY_CARE_PROVIDER_SITE_OTHER): Payer: Medicare HMO | Admitting: Sports Medicine

## 2017-08-16 ENCOUNTER — Encounter: Payer: Self-pay | Admitting: Sports Medicine

## 2017-08-16 DIAGNOSIS — Z9889 Other specified postprocedural states: Secondary | ICD-10-CM

## 2017-08-16 DIAGNOSIS — M869 Osteomyelitis, unspecified: Secondary | ICD-10-CM

## 2017-08-16 DIAGNOSIS — I2510 Atherosclerotic heart disease of native coronary artery without angina pectoris: Secondary | ICD-10-CM | POA: Diagnosis not present

## 2017-08-16 DIAGNOSIS — F419 Anxiety disorder, unspecified: Secondary | ICD-10-CM | POA: Diagnosis not present

## 2017-08-16 DIAGNOSIS — Z4781 Encounter for orthopedic aftercare following surgical amputation: Secondary | ICD-10-CM | POA: Diagnosis not present

## 2017-08-16 DIAGNOSIS — I739 Peripheral vascular disease, unspecified: Secondary | ICD-10-CM

## 2017-08-16 DIAGNOSIS — G7100 Muscular dystrophy, unspecified: Secondary | ICD-10-CM | POA: Diagnosis not present

## 2017-08-16 DIAGNOSIS — E1140 Type 2 diabetes mellitus with diabetic neuropathy, unspecified: Secondary | ICD-10-CM

## 2017-08-16 DIAGNOSIS — E1169 Type 2 diabetes mellitus with other specified complication: Secondary | ICD-10-CM | POA: Diagnosis not present

## 2017-08-16 DIAGNOSIS — F329 Major depressive disorder, single episode, unspecified: Secondary | ICD-10-CM | POA: Diagnosis not present

## 2017-08-16 DIAGNOSIS — M86172 Other acute osteomyelitis, left ankle and foot: Secondary | ICD-10-CM | POA: Diagnosis not present

## 2017-08-16 DIAGNOSIS — B9561 Methicillin susceptible Staphylococcus aureus infection as the cause of diseases classified elsewhere: Secondary | ICD-10-CM | POA: Diagnosis not present

## 2017-08-16 DIAGNOSIS — Z89412 Acquired absence of left great toe: Secondary | ICD-10-CM | POA: Diagnosis not present

## 2017-08-16 NOTE — Progress Notes (Signed)
Subjective: [name] is a 84 y.o. male patient seen today in office for POV #4 (DOS 07-18-17), S/P Left hallux + distal metatarsal amputation. Patient denies pain at surgical site, denies calf pain, denies headache, chest pain, shortness of breath, nausea, vomiting, fever, or chills. Patient states that he is doing good and has not had any pain since he has had surgery.  No other issues noted.   Patient is assisted by daughter this visit.   Patient Active Problem List   Diagnosis Date Noted  . Abnormal breath sounds 03/05/2017  . Chronic pain 02/05/2017  . Acute diastolic CHF (congestive heart failure) (HCC) 12/04/2016  . S/P shoulder replacement, left 11/16/2016  . Preoperative clearance 10/15/2016  . Hyperlipidemia 10/15/2016  . Diabetes (HCC) 03/10/2015  . Perennial and seasonal allergic rhinitis 12/20/2014  . Hematuria 12/20/2014  . Loss of weight 05/06/2014  . Other pancytopenia (HCC) 05/05/2014  . CAP (community acquired pneumonia) 05/05/2014  . Hypocalcemia 05/05/2014  . Skin lesion 04/30/2014  . 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 Abdominal pain, right upper quadrant 05/04/2013  . Cold intolerance 05/04/2013  . Low back pain 04/03/2012  . Encounter for long-term (current) use of other medications 12/11/2011  . Routine general medical examination at a health care facility 12/17/2010  . Screening for prostate cancer 12/12/2010  . Osteoarthrosis, unspecified whether generalized or localized, unspecified site 10/24/2010  . SKIN RASH 06/29/2009  . Iron deficiency anemia 03/10/2009  . BACK PAIN, LUMBAR 03/10/2009  . CHARCOT-MARIE-TOOTH DISEASE 05/03/2008  . DYSPNEA 05/03/2008  . DEPRESSIVE DISORDER NOT ELSEWHERE CLASSIFIED 05/22/2007  . CHEST PAIN 05/22/2007  . ANEMIA, PERNICIOUS 12/02/2006  . Essential hypertension 07/26/2006  . Coronary atherosclerosis 07/26/2006  . GERD 07/26/2006    Current Outpatient Medications on File Prior to Visit  Medication  Sig Dispense Refill  . ACCU-CHEK SOFTCLIX LANCETS lancets Used to check blood sugars daily. 100 each 12  . acetaminophen (TYLENOL) 500 MG tablet Take 1,000-1,500 mg 2 (two) times daily as needed by mouth for moderate pain.    . Alcohol Swabs (B-D SINGLE USE SWABS REGULAR) PADS Used to check blood sugars daily. 100 each 11  . Ascorbic Acid (VITAMIN C) 500 MG tablet Take 500 mg by mouth 2 (two) times daily.     . aspirin (GOODSENSE ASPIRIN) 325 MG tablet Take 325 mg daily by mouth.     . Blood Glucose Calibration (ACCU-CHEK AVIVA) SOLN 1 Bottle by In Vitro route as needed. 1 each 0  . Blood Glucose Monitoring Suppl (ACCU-CHEK AVIVA) device Use as instructed 1 each 0  . cholecalciferol (VITAMIN D) 1000 units tablet Take 1,000 Units at bedtime by mouth.    . Cyanocobalamin (B-12 PO) Take 1 tablet daily by mouth.    . Cyanocobalamin (VITAMIN B-12 IJ) GET A B12 INJECTION ONCE EVERY MONTH     . docusate sodium (COLACE) 100 MG capsule Take 1 capsule (100 mg total) by mouth daily as needed. 30 capsule 2  . ferrous sulfate 325 (65 FE) MG tablet Take 325 mg 2 (two) times daily by mouth.    . gabapentin (NEURONTIN) 300 MG capsule Take 2 capsules (600 mg total) by mouth at bedtime. 180 capsule 3  . glucose blood (ACCU-CHEK AVIVA) test strip Used to check blood sugars daily. 100 each 12  . Inositol Niacinate (NIACIN FLUSH FREE) 500 MG CAPS Take 500 mg at bedtime by mouth.     . Lancet Devices (SIMPLE DIAGNOSTICS LANCING DEV) MISC   1  .   metFORMIN (GLUCOPHAGE-XR) 500 MG 24 hr tablet Take 500 mg by mouth 2 (two) times daily.    . montelukast (SINGULAIR) 10 MG tablet TAKE 1 TABLET AT BEDTIME 90 tablet 3  . Multiple Vitamins-Minerals (MULTIVITAMIN WITH MINERALS) tablet Take 1 tablet by mouth daily.      . promethazine (PHENERGAN) 12.5 MG tablet Take 1 tablet (12.5 mg total) by mouth every 6 (six) hours as needed for nausea or vomiting. 30 tablet 0  . sertraline (ZOLOFT) 50 MG tablet TAKE 1 TABLET EVERY DAY 90  tablet 3  . simvastatin (ZOCOR) 80 MG tablet Take 1 tablet (80 mg total) by mouth at bedtime. 90 tablet 3  . sulfamethoxazole-trimethoprim (BACTRIM DS,SEPTRA DS) 800-160 MG tablet Take 1 tablet by mouth 2 (two) times daily. 20 tablet 0  . sulfamethoxazole-trimethoprim (BACTRIM DS,SEPTRA DS) 800-160 MG tablet Take 1 tablet by mouth 2 (two) times daily. 14 tablet 0  . traMADol (ULTRAM) 50 MG tablet Take 1 tablet (50 mg total) by mouth every 12 (twelve) hours as needed. 20 tablet 0  . Zinc 50 MG CAPS Take 50 mg at bedtime by mouth.      No current facility-administered medications on file prior to visit.     No Known Allergies  Objective: There were no vitals filed for this visit.  General: No acute distress, AAOx3  Left foot: Sutures intact with no gapping or dehiscence at surgical site, mild swelling to left forefoot, decreased erythema, no warmth, no drainage, no other signs of infection noted, Capillary fill time <5 seconds in all remaining digits, no pain to palpation left foot, s/p 1st hallux + distal met amp,  No pain with calf compression.   Assessment and Plan:  Problem List Items Addressed This Visit    None    Visit Diagnoses    Post-operative state    -  Primary   Type 2 diabetes, controlled, with neuropathy (HCC)       PVD (peripheral vascular disease) (HCC)       Osteomyelitis of left foot, unspecified type (HCC)       S/P foot surgery, left           -Patient seen and evaluated -All remaining sutures removed -Applied dry sterile dressing to surgical site left foot secured with ACE wrap and stockinet  -Advised patient to make sure to keep dressings clean, dry, and intact to left surgical site, removing the ACE as needed  -Advised patient may use cam boot -Advised patient to limit activity to necessity  -Advised patient to elevate as necessary  -Will plan for incision check and allowing patient to shower at next office visit. In the meantime, patient to call office if  any issues or problems arise.   Titorya Stover, DPM 

## 2017-08-20 DIAGNOSIS — G7100 Muscular dystrophy, unspecified: Secondary | ICD-10-CM | POA: Diagnosis not present

## 2017-08-20 DIAGNOSIS — Z89412 Acquired absence of left great toe: Secondary | ICD-10-CM | POA: Diagnosis not present

## 2017-08-20 DIAGNOSIS — I2510 Atherosclerotic heart disease of native coronary artery without angina pectoris: Secondary | ICD-10-CM | POA: Diagnosis not present

## 2017-08-20 DIAGNOSIS — B9561 Methicillin susceptible Staphylococcus aureus infection as the cause of diseases classified elsewhere: Secondary | ICD-10-CM | POA: Diagnosis not present

## 2017-08-20 DIAGNOSIS — F419 Anxiety disorder, unspecified: Secondary | ICD-10-CM | POA: Diagnosis not present

## 2017-08-20 DIAGNOSIS — F329 Major depressive disorder, single episode, unspecified: Secondary | ICD-10-CM | POA: Diagnosis not present

## 2017-08-20 DIAGNOSIS — E1169 Type 2 diabetes mellitus with other specified complication: Secondary | ICD-10-CM | POA: Diagnosis not present

## 2017-08-20 DIAGNOSIS — M86172 Other acute osteomyelitis, left ankle and foot: Secondary | ICD-10-CM | POA: Diagnosis not present

## 2017-08-20 DIAGNOSIS — Z4781 Encounter for orthopedic aftercare following surgical amputation: Secondary | ICD-10-CM | POA: Diagnosis not present

## 2017-08-21 DIAGNOSIS — G7100 Muscular dystrophy, unspecified: Secondary | ICD-10-CM | POA: Diagnosis not present

## 2017-08-21 DIAGNOSIS — F419 Anxiety disorder, unspecified: Secondary | ICD-10-CM | POA: Diagnosis not present

## 2017-08-21 DIAGNOSIS — E1169 Type 2 diabetes mellitus with other specified complication: Secondary | ICD-10-CM | POA: Diagnosis not present

## 2017-08-21 DIAGNOSIS — B9561 Methicillin susceptible Staphylococcus aureus infection as the cause of diseases classified elsewhere: Secondary | ICD-10-CM | POA: Diagnosis not present

## 2017-08-21 DIAGNOSIS — M86172 Other acute osteomyelitis, left ankle and foot: Secondary | ICD-10-CM | POA: Diagnosis not present

## 2017-08-21 DIAGNOSIS — Z4781 Encounter for orthopedic aftercare following surgical amputation: Secondary | ICD-10-CM | POA: Diagnosis not present

## 2017-08-21 DIAGNOSIS — Z89412 Acquired absence of left great toe: Secondary | ICD-10-CM | POA: Diagnosis not present

## 2017-08-21 DIAGNOSIS — I2510 Atherosclerotic heart disease of native coronary artery without angina pectoris: Secondary | ICD-10-CM | POA: Diagnosis not present

## 2017-08-21 DIAGNOSIS — F329 Major depressive disorder, single episode, unspecified: Secondary | ICD-10-CM | POA: Diagnosis not present

## 2017-08-22 ENCOUNTER — Ambulatory Visit (INDEPENDENT_AMBULATORY_CARE_PROVIDER_SITE_OTHER): Payer: Medicare HMO | Admitting: Sports Medicine

## 2017-08-22 ENCOUNTER — Encounter: Payer: Self-pay | Admitting: Sports Medicine

## 2017-08-22 VITALS — BP 132/58 | HR 60 | Resp 15

## 2017-08-22 DIAGNOSIS — I739 Peripheral vascular disease, unspecified: Secondary | ICD-10-CM

## 2017-08-22 DIAGNOSIS — E1140 Type 2 diabetes mellitus with diabetic neuropathy, unspecified: Secondary | ICD-10-CM

## 2017-08-22 DIAGNOSIS — Z9889 Other specified postprocedural states: Secondary | ICD-10-CM

## 2017-08-22 DIAGNOSIS — M869 Osteomyelitis, unspecified: Secondary | ICD-10-CM

## 2017-08-22 NOTE — Progress Notes (Signed)
Subjective: [name] is a 84 y.o. male patient seen today in office for POV #5 (DOS 07-18-17), S/P Left hallux + distal metatarsal amputation. Patient denies pain at surgical site, denies calf pain, denies headache, chest pain, shortness of breath, nausea, vomiting, fever, or chills. Patient states that he is doing good and has not had any pain since he has had surgery as previously noted.  No other issues noted.   Patient is assisted by daughter this visit.   Patient Active Problem List   Diagnosis Date Noted  . Abnormal breath sounds 03/05/2017  . Chronic pain 02/05/2017  . Acute diastolic CHF (congestive heart failure) ([place]) 12/04/2016  . S/P shoulder replacement, left 11/16/2016  . Preoperative clearance 10/15/2016  . Hyperlipidemia 10/15/2016  . Diabetes ([place]) 03/10/2015  . Perennial and seasonal allergic rhinitis 12/20/2014  . Hematuria 12/20/2014  . Loss of weight 05/06/2014  . Other pancytopenia ([place]) 05/05/2014  . CAP (community acquired pneumonia) 05/05/2014  . Hypocalcemia 05/05/2014  . Skin lesion 04/30/2014  . 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 Abdominal pain, right upper quadrant 05/04/2013  . Cold intolerance 05/04/2013  . Low back pain 04/03/2012  . Encounter for long-term (current) use of other medications 12/11/2011  . Routine general medical examination at a health care facility 12/17/2010  . Screening for prostate cancer 12/12/2010  . Osteoarthrosis, unspecified whether generalized or localized, unspecified site 10/24/2010  . SKIN RASH 06/29/2009  . Iron deficiency anemia 03/10/2009  . BACK PAIN, LUMBAR 03/10/2009  . [place] DISEASE 05/03/2008  . DYSPNEA 05/03/2008  . DEPRESSIVE DISORDER NOT ELSEWHERE CLASSIFIED 05/22/2007  . CHEST PAIN 05/22/2007  . ANEMIA, PERNICIOUS 12/02/2006  . Essential hypertension 07/26/2006  . Coronary atherosclerosis 07/26/2006  . GERD 07/26/2006    Current Outpatient Medications on File Prior  to Visit  Medication Sig Dispense Refill  . ACCU-CHEK SOFTCLIX LANCETS lancets Used to check blood sugars daily. 100 each 12  . acetaminophen (TYLENOL) 500 MG tablet Take 1,000-1,500 mg 2 (two) times daily as needed by mouth for moderate pain.    . Alcohol Swabs (B-D SINGLE USE SWABS REGULAR) PADS Used to check blood sugars daily. 100 each 11  . Ascorbic Acid (VITAMIN C) 500 MG tablet Take 500 mg by mouth 2 (two) times daily.     Marland Kitchen aspirin (GOODSENSE ASPIRIN) 325 MG tablet Take 325 mg daily by mouth.     . Blood Glucose Calibration (ACCU-CHEK AVIVA) SOLN 1 Bottle by In Vitro route as needed. 1 each 0  . Blood Glucose Monitoring Suppl (ACCU-CHEK AVIVA) device Use as instructed 1 each 0  . cholecalciferol (VITAMIN D) 1000 units tablet Take 1,000 Units at bedtime by mouth.    . Cyanocobalamin (B-12 PO) Take 1 tablet daily by mouth.    . Cyanocobalamin (VITAMIN B-12 IJ) GET A B12 INJECTION ONCE EVERY MONTH     . docusate sodium (COLACE) 100 MG capsule Take 1 capsule (100 mg total) by mouth daily as needed. 30 capsule 2  . ferrous sulfate 325 (65 FE) MG tablet Take 325 mg 2 (two) times daily by mouth.    . gabapentin (NEURONTIN) 300 MG capsule Take 2 capsules (600 mg total) by mouth at bedtime. 180 capsule 3  . glucose blood (ACCU-CHEK AVIVA) test strip Used to check blood sugars daily. 100 each 12  . Inositol Niacinate (NIACIN FLUSH FREE) 500 MG CAPS Take 500 mg at bedtime by mouth.     Elmore Guise Devices (SIMPLE DIAGNOSTICS LANCING DEV) MISC  1  . metFORMIN (GLUCOPHAGE-XR) 500 MG 24 hr tablet Take 500 mg by mouth 2 (two) times daily.    . montelukast (SINGULAIR) 10 MG tablet TAKE 1 TABLET AT BEDTIME 90 tablet 3  . Multiple Vitamins-Minerals (MULTIVITAMIN WITH MINERALS) tablet Take 1 tablet by mouth daily.      . promethazine (PHENERGAN) 12.5 MG tablet Take 1 tablet (12.5 mg total) by mouth every 6 (six) hours as needed for nausea or vomiting. 30 tablet 0  . sertraline (ZOLOFT) 50 MG tablet TAKE 1  TABLET EVERY DAY 90 tablet 3  . simvastatin (ZOCOR) 80 MG tablet Take 1 tablet (80 mg total) by mouth at bedtime. 90 tablet 3  . sulfamethoxazole-trimethoprim (BACTRIM DS,SEPTRA DS) 800-160 MG tablet Take 1 tablet by mouth 2 (two) times daily. 20 tablet 0  . sulfamethoxazole-trimethoprim (BACTRIM DS,SEPTRA DS) 800-160 MG tablet Take 1 tablet by mouth 2 (two) times daily. 14 tablet 0  . traMADol (ULTRAM) 50 MG tablet Take 1 tablet (50 mg total) by mouth every 12 (twelve) hours as needed. 20 tablet 0  . Zinc 50 MG CAPS Take 50 mg at bedtime by mouth.      No current facility-administered medications on file prior to visit.     No Known Allergies  Objective: There were no vitals filed for this visit.  General: No acute distress, AAOx3  Left foot: Incision intact with no gapping or dehiscence at surgical site, mild swelling to left forefoot, decreased erythema, no warmth, no drainage, no other signs of infection noted, Capillary fill time <5 seconds in all remaining digits, no pain to palpation left foot, s/p 1st hallux + distal met amp,  No pain with calf compression.   Assessment and Plan:  Problem List Items Addressed This Visit    None    Visit Diagnoses    Post-operative state    -  Primary   Type 2 diabetes, controlled, with neuropathy (HCC)       PVD (peripheral vascular disease) ([place])       Osteomyelitis of left foot, unspecified type (HCC)       S/P foot surgery, left           -Patient seen and evaluated -Incision check performed -Applied sock patient to continue at home with the same -Patient may take a bath with assistance of home nurse -Advised to continue with cam boot until next office visit -Advised patient to limit activity to necessity  -Advised patient to elevate as necessary  -Will plan for incision check and modifying left insole and allowing patient to shower at next office visit. In the meantime, patient to call office if any issues or problems arise.    Landis Martins, DPM

## 2017-08-23 DIAGNOSIS — E1169 Type 2 diabetes mellitus with other specified complication: Secondary | ICD-10-CM | POA: Diagnosis not present

## 2017-08-23 DIAGNOSIS — G7100 Muscular dystrophy, unspecified: Secondary | ICD-10-CM | POA: Diagnosis not present

## 2017-08-23 DIAGNOSIS — B9561 Methicillin susceptible Staphylococcus aureus infection as the cause of diseases classified elsewhere: Secondary | ICD-10-CM | POA: Diagnosis not present

## 2017-08-23 DIAGNOSIS — I2510 Atherosclerotic heart disease of native coronary artery without angina pectoris: Secondary | ICD-10-CM | POA: Diagnosis not present

## 2017-08-23 DIAGNOSIS — F329 Major depressive disorder, single episode, unspecified: Secondary | ICD-10-CM | POA: Diagnosis not present

## 2017-08-23 DIAGNOSIS — Z4781 Encounter for orthopedic aftercare following surgical amputation: Secondary | ICD-10-CM | POA: Diagnosis not present

## 2017-08-23 DIAGNOSIS — M86172 Other acute osteomyelitis, left ankle and foot: Secondary | ICD-10-CM | POA: Diagnosis not present

## 2017-08-23 DIAGNOSIS — Z89412 Acquired absence of left great toe: Secondary | ICD-10-CM | POA: Diagnosis not present

## 2017-08-23 DIAGNOSIS — F419 Anxiety disorder, unspecified: Secondary | ICD-10-CM | POA: Diagnosis not present

## 2017-08-26 DIAGNOSIS — G7100 Muscular dystrophy, unspecified: Secondary | ICD-10-CM | POA: Diagnosis not present

## 2017-08-26 DIAGNOSIS — E1169 Type 2 diabetes mellitus with other specified complication: Secondary | ICD-10-CM | POA: Diagnosis not present

## 2017-08-26 DIAGNOSIS — F419 Anxiety disorder, unspecified: Secondary | ICD-10-CM | POA: Diagnosis not present

## 2017-08-26 DIAGNOSIS — B9561 Methicillin susceptible Staphylococcus aureus infection as the cause of diseases classified elsewhere: Secondary | ICD-10-CM | POA: Diagnosis not present

## 2017-08-26 DIAGNOSIS — M86172 Other acute osteomyelitis, left ankle and foot: Secondary | ICD-10-CM | POA: Diagnosis not present

## 2017-08-26 DIAGNOSIS — Z4781 Encounter for orthopedic aftercare following surgical amputation: Secondary | ICD-10-CM | POA: Diagnosis not present

## 2017-08-26 DIAGNOSIS — F329 Major depressive disorder, single episode, unspecified: Secondary | ICD-10-CM | POA: Diagnosis not present

## 2017-08-26 DIAGNOSIS — Z89412 Acquired absence of left great toe: Secondary | ICD-10-CM | POA: Diagnosis not present

## 2017-08-26 DIAGNOSIS — I2510 Atherosclerotic heart disease of native coronary artery without angina pectoris: Secondary | ICD-10-CM | POA: Diagnosis not present

## 2017-08-28 DIAGNOSIS — M86172 Other acute osteomyelitis, left ankle and foot: Secondary | ICD-10-CM | POA: Diagnosis not present

## 2017-08-28 DIAGNOSIS — G7100 Muscular dystrophy, unspecified: Secondary | ICD-10-CM | POA: Diagnosis not present

## 2017-08-28 DIAGNOSIS — F329 Major depressive disorder, single episode, unspecified: Secondary | ICD-10-CM | POA: Diagnosis not present

## 2017-08-28 DIAGNOSIS — Z89412 Acquired absence of left great toe: Secondary | ICD-10-CM | POA: Diagnosis not present

## 2017-08-28 DIAGNOSIS — B9561 Methicillin susceptible Staphylococcus aureus infection as the cause of diseases classified elsewhere: Secondary | ICD-10-CM | POA: Diagnosis not present

## 2017-08-28 DIAGNOSIS — E1169 Type 2 diabetes mellitus with other specified complication: Secondary | ICD-10-CM | POA: Diagnosis not present

## 2017-08-28 DIAGNOSIS — I2510 Atherosclerotic heart disease of native coronary artery without angina pectoris: Secondary | ICD-10-CM | POA: Diagnosis not present

## 2017-08-28 DIAGNOSIS — F419 Anxiety disorder, unspecified: Secondary | ICD-10-CM | POA: Diagnosis not present

## 2017-08-28 DIAGNOSIS — Z4781 Encounter for orthopedic aftercare following surgical amputation: Secondary | ICD-10-CM | POA: Diagnosis not present

## 2017-08-29 DIAGNOSIS — F329 Major depressive disorder, single episode, unspecified: Secondary | ICD-10-CM | POA: Diagnosis not present

## 2017-08-29 DIAGNOSIS — I2510 Atherosclerotic heart disease of native coronary artery without angina pectoris: Secondary | ICD-10-CM | POA: Diagnosis not present

## 2017-08-29 DIAGNOSIS — B9561 Methicillin susceptible Staphylococcus aureus infection as the cause of diseases classified elsewhere: Secondary | ICD-10-CM | POA: Diagnosis not present

## 2017-08-29 DIAGNOSIS — M86172 Other acute osteomyelitis, left ankle and foot: Secondary | ICD-10-CM | POA: Diagnosis not present

## 2017-08-29 DIAGNOSIS — Z4781 Encounter for orthopedic aftercare following surgical amputation: Secondary | ICD-10-CM | POA: Diagnosis not present

## 2017-08-29 DIAGNOSIS — F419 Anxiety disorder, unspecified: Secondary | ICD-10-CM | POA: Diagnosis not present

## 2017-08-29 DIAGNOSIS — Z89412 Acquired absence of left great toe: Secondary | ICD-10-CM | POA: Diagnosis not present

## 2017-08-29 DIAGNOSIS — E1169 Type 2 diabetes mellitus with other specified complication: Secondary | ICD-10-CM | POA: Diagnosis not present

## 2017-08-29 DIAGNOSIS — G7100 Muscular dystrophy, unspecified: Secondary | ICD-10-CM | POA: Diagnosis not present

## 2017-08-30 ENCOUNTER — Encounter: Payer: Medicare HMO | Admitting: Sports Medicine

## 2017-09-04 DIAGNOSIS — I2510 Atherosclerotic heart disease of native coronary artery without angina pectoris: Secondary | ICD-10-CM | POA: Diagnosis not present

## 2017-09-04 DIAGNOSIS — Z4781 Encounter for orthopedic aftercare following surgical amputation: Secondary | ICD-10-CM | POA: Diagnosis not present

## 2017-09-04 DIAGNOSIS — F419 Anxiety disorder, unspecified: Secondary | ICD-10-CM | POA: Diagnosis not present

## 2017-09-04 DIAGNOSIS — B9561 Methicillin susceptible Staphylococcus aureus infection as the cause of diseases classified elsewhere: Secondary | ICD-10-CM | POA: Diagnosis not present

## 2017-09-04 DIAGNOSIS — F329 Major depressive disorder, single episode, unspecified: Secondary | ICD-10-CM | POA: Diagnosis not present

## 2017-09-04 DIAGNOSIS — Z89412 Acquired absence of left great toe: Secondary | ICD-10-CM | POA: Diagnosis not present

## 2017-09-04 DIAGNOSIS — M86172 Other acute osteomyelitis, left ankle and foot: Secondary | ICD-10-CM | POA: Diagnosis not present

## 2017-09-04 DIAGNOSIS — G7100 Muscular dystrophy, unspecified: Secondary | ICD-10-CM | POA: Diagnosis not present

## 2017-09-04 DIAGNOSIS — E1169 Type 2 diabetes mellitus with other specified complication: Secondary | ICD-10-CM | POA: Diagnosis not present

## 2017-09-05 ENCOUNTER — Ambulatory Visit: Payer: Medicare HMO | Admitting: Endocrinology

## 2017-09-05 DIAGNOSIS — F419 Anxiety disorder, unspecified: Secondary | ICD-10-CM | POA: Diagnosis not present

## 2017-09-05 DIAGNOSIS — B9561 Methicillin susceptible Staphylococcus aureus infection as the cause of diseases classified elsewhere: Secondary | ICD-10-CM | POA: Diagnosis not present

## 2017-09-05 DIAGNOSIS — G600 Hereditary motor and sensory neuropathy: Secondary | ICD-10-CM | POA: Diagnosis not present

## 2017-09-05 DIAGNOSIS — E1169 Type 2 diabetes mellitus with other specified complication: Secondary | ICD-10-CM | POA: Diagnosis not present

## 2017-09-05 DIAGNOSIS — J9610 Chronic respiratory failure, unspecified whether with hypoxia or hypercapnia: Secondary | ICD-10-CM | POA: Diagnosis not present

## 2017-09-05 DIAGNOSIS — G7100 Muscular dystrophy, unspecified: Secondary | ICD-10-CM | POA: Diagnosis not present

## 2017-09-05 DIAGNOSIS — Z89412 Acquired absence of left great toe: Secondary | ICD-10-CM | POA: Diagnosis not present

## 2017-09-05 DIAGNOSIS — F329 Major depressive disorder, single episode, unspecified: Secondary | ICD-10-CM | POA: Diagnosis not present

## 2017-09-05 DIAGNOSIS — I2510 Atherosclerotic heart disease of native coronary artery without angina pectoris: Secondary | ICD-10-CM | POA: Diagnosis not present

## 2017-09-05 DIAGNOSIS — Z4781 Encounter for orthopedic aftercare following surgical amputation: Secondary | ICD-10-CM | POA: Diagnosis not present

## 2017-09-05 DIAGNOSIS — M86172 Other acute osteomyelitis, left ankle and foot: Secondary | ICD-10-CM | POA: Diagnosis not present

## 2017-09-06 ENCOUNTER — Encounter: Payer: Self-pay | Admitting: Sports Medicine

## 2017-09-06 ENCOUNTER — Ambulatory Visit (INDEPENDENT_AMBULATORY_CARE_PROVIDER_SITE_OTHER): Payer: Medicare HMO | Admitting: Sports Medicine

## 2017-09-06 VITALS — BP 124/56 | HR 57 | Temp 97.2°F | Resp 15

## 2017-09-06 DIAGNOSIS — S90415A Abrasion, left lesser toe(s), initial encounter: Secondary | ICD-10-CM

## 2017-09-06 DIAGNOSIS — Z9889 Other specified postprocedural states: Secondary | ICD-10-CM

## 2017-09-06 DIAGNOSIS — M869 Osteomyelitis, unspecified: Secondary | ICD-10-CM

## 2017-09-06 DIAGNOSIS — I739 Peripheral vascular disease, unspecified: Secondary | ICD-10-CM

## 2017-09-06 DIAGNOSIS — E1140 Type 2 diabetes mellitus with diabetic neuropathy, unspecified: Secondary | ICD-10-CM

## 2017-09-06 NOTE — Progress Notes (Signed)
Subjective: [name] is a 84 y.o. male patient seen today in office for POV #6 (DOS 07-18-17), S/P Left hallux + distal metatarsal amputation. Patient denies pain at surgical site, reports that the area is doing perfect, denies calf pain, denies headache, chest pain, shortness of breath, nausea, vomiting, fever, or chills. Patient states that he did bump the top of his left second toe this morning when putting on his shoe.  No other issues noted.   Patient is assisted by daughter this visit.   Patient Active Problem List   Diagnosis Date Noted  . Abnormal breath sounds 03/05/2017  . Chronic pain 02/05/2017  . Acute diastolic CHF (congestive heart failure) ([place]) 12/04/2016  . S/P shoulder replacement, left 11/16/2016  . Preoperative clearance 10/15/2016  . Hyperlipidemia 10/15/2016  . Diabetes ([place]) 03/10/2015  . Perennial and seasonal allergic rhinitis 12/20/2014  . Hematuria 12/20/2014  . Loss of weight 05/06/2014  . Other pancytopenia ([place]) 05/05/2014  . CAP (community acquired pneumonia) 05/05/2014  . Hypocalcemia 05/05/2014  . Skin lesion 04/30/2014  . 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 Abdominal pain, right upper quadrant 05/04/2013  . Cold intolerance 05/04/2013  . Low back pain 04/03/2012  . Encounter for long-term (current) use of other medications 12/11/2011  . Routine general medical examination at a health care facility 12/17/2010  . Screening for prostate cancer 12/12/2010  . Osteoarthrosis, unspecified whether generalized or localized, unspecified site 10/24/2010  . SKIN RASH 06/29/2009  . Iron deficiency anemia 03/10/2009  . BACK PAIN, LUMBAR 03/10/2009  . [place] DISEASE 05/03/2008  . DYSPNEA 05/03/2008  . DEPRESSIVE DISORDER NOT ELSEWHERE CLASSIFIED 05/22/2007  . CHEST PAIN 05/22/2007  . ANEMIA, PERNICIOUS 12/02/2006  . Essential hypertension 07/26/2006  . Coronary atherosclerosis 07/26/2006  . GERD 07/26/2006    Current  Outpatient Medications on File Prior to Visit  Medication Sig Dispense Refill  . ACCU-CHEK SOFTCLIX LANCETS lancets Used to check blood sugars daily. 100 each 12  . acetaminophen (TYLENOL) 500 MG tablet Take 1,000-1,500 mg 2 (two) times daily as needed by mouth for moderate pain.    . Alcohol Swabs (B-D SINGLE USE SWABS REGULAR) PADS Used to check blood sugars daily. 100 each 11  . Ascorbic Acid (VITAMIN C) 500 MG tablet Take 500 mg by mouth 2 (two) times daily.     Marland Kitchen aspirin (GOODSENSE ASPIRIN) 325 MG tablet Take 325 mg daily by mouth.     . Blood Glucose Calibration (ACCU-CHEK AVIVA) SOLN 1 Bottle by In Vitro route as needed. 1 each 0  . Blood Glucose Monitoring Suppl (ACCU-CHEK AVIVA) device Use as instructed 1 each 0  . cholecalciferol (VITAMIN D) 1000 units tablet Take 1,000 Units at bedtime by mouth.    . Cyanocobalamin (B-12 PO) Take 1 tablet daily by mouth.    . Cyanocobalamin (VITAMIN B-12 IJ) GET A B12 INJECTION ONCE EVERY MONTH     . docusate sodium (COLACE) 100 MG capsule Take 1 capsule (100 mg total) by mouth daily as needed. 30 capsule 2  . ferrous sulfate 325 (65 FE) MG tablet Take 325 mg 2 (two) times daily by mouth.    . gabapentin (NEURONTIN) 300 MG capsule Take 2 capsules (600 mg total) by mouth at bedtime. 180 capsule 3  . glucose blood (ACCU-CHEK AVIVA) test strip Used to check blood sugars daily. 100 each 12  . Inositol Niacinate (NIACIN FLUSH FREE) 500 MG CAPS Take 500 mg at bedtime by mouth.     Elmore Guise  Devices (SIMPLE DIAGNOSTICS LANCING DEV) MISC   1  . metFORMIN (GLUCOPHAGE-XR) 500 MG 24 hr tablet Take 500 mg by mouth 2 (two) times daily.    . montelukast (SINGULAIR) 10 MG tablet TAKE 1 TABLET AT BEDTIME 90 tablet 3  . Multiple Vitamins-Minerals (MULTIVITAMIN WITH MINERALS) tablet Take 1 tablet by mouth daily.      . promethazine (PHENERGAN) 12.5 MG tablet Take 1 tablet (12.5 mg total) by mouth every 6 (six) hours as needed for nausea or vomiting. 30 tablet 0  .  sertraline (ZOLOFT) 50 MG tablet TAKE 1 TABLET EVERY DAY 90 tablet 3  . simvastatin (ZOCOR) 80 MG tablet Take 1 tablet (80 mg total) by mouth at bedtime. 90 tablet 3  . sulfamethoxazole-trimethoprim (BACTRIM DS,SEPTRA DS) 800-160 MG tablet Take 1 tablet by mouth 2 (two) times daily. 20 tablet 0  . sulfamethoxazole-trimethoprim (BACTRIM DS,SEPTRA DS) 800-160 MG tablet Take 1 tablet by mouth 2 (two) times daily. 14 tablet 0  . traMADol (ULTRAM) 50 MG tablet Take 1 tablet (50 mg total) by mouth every 12 (twelve) hours as needed. 20 tablet 0  . Zinc 50 MG CAPS Take 50 mg at bedtime by mouth.      No current facility-administered medications on file prior to visit.     No Known Allergies  Objective: There were no vitals filed for this visit.  General: No acute distress, AAOx3  Left foot: Abrasion to left second toe that is superficial with no signs of infection.  Incision well-healed at surgical site, mild swelling to left forefoot, decreased erythema, no warmth, no drainage, no other signs of infection noted, Capillary fill time <5 seconds in all remaining digits, no pain to palpation left foot, s/p 1st hallux + distal met amp,  No pain with calf compression.   Assessment and Plan:  Problem List Items Addressed This Visit    None    Visit Diagnoses    Post-operative state    -  Primary   Type 2 diabetes, controlled, with neuropathy (HCC)       PVD (peripheral vascular disease) ([place])       Osteomyelitis of left foot, unspecified type (HCC)       S/P foot surgery, left       Abrasion of lesser toe of left foot, initial encounter           -Patient seen and evaluated -Applied antibiotic cream and Band-Aid to left second toe and advised patient to do the same until area is healed -Surgical site is well-healed patient may now use custom shoe -I added today a felt hallux padding to prevent patient from having excessive shear in his custom shoe since his amputation -Advised patient to limit  activity to tolerance -Advised patient to elevate as necessary and for his upcoming road trip to travel with caution and make sure he is elevating and getting out to stretch to prevent against any symptoms of a blood clot -Patient may resume normal showering -Will plan for final postop check at next office visit. In the meantime, patient to call office if any issues or problems arise.   Landis Martins, DPM

## 2017-09-10 DIAGNOSIS — G7100 Muscular dystrophy, unspecified: Secondary | ICD-10-CM | POA: Diagnosis not present

## 2017-09-10 DIAGNOSIS — E1169 Type 2 diabetes mellitus with other specified complication: Secondary | ICD-10-CM | POA: Diagnosis not present

## 2017-09-10 DIAGNOSIS — Z4781 Encounter for orthopedic aftercare following surgical amputation: Secondary | ICD-10-CM | POA: Diagnosis not present

## 2017-09-10 DIAGNOSIS — B9561 Methicillin susceptible Staphylococcus aureus infection as the cause of diseases classified elsewhere: Secondary | ICD-10-CM | POA: Diagnosis not present

## 2017-09-10 DIAGNOSIS — F329 Major depressive disorder, single episode, unspecified: Secondary | ICD-10-CM | POA: Diagnosis not present

## 2017-09-10 DIAGNOSIS — Z89412 Acquired absence of left great toe: Secondary | ICD-10-CM | POA: Diagnosis not present

## 2017-09-10 DIAGNOSIS — I2510 Atherosclerotic heart disease of native coronary artery without angina pectoris: Secondary | ICD-10-CM | POA: Diagnosis not present

## 2017-09-10 DIAGNOSIS — M86172 Other acute osteomyelitis, left ankle and foot: Secondary | ICD-10-CM | POA: Diagnosis not present

## 2017-09-10 DIAGNOSIS — F419 Anxiety disorder, unspecified: Secondary | ICD-10-CM | POA: Diagnosis not present

## 2017-09-11 DIAGNOSIS — I2510 Atherosclerotic heart disease of native coronary artery without angina pectoris: Secondary | ICD-10-CM | POA: Diagnosis not present

## 2017-09-11 DIAGNOSIS — M86172 Other acute osteomyelitis, left ankle and foot: Secondary | ICD-10-CM | POA: Diagnosis not present

## 2017-09-11 DIAGNOSIS — F329 Major depressive disorder, single episode, unspecified: Secondary | ICD-10-CM | POA: Diagnosis not present

## 2017-09-11 DIAGNOSIS — B9561 Methicillin susceptible Staphylococcus aureus infection as the cause of diseases classified elsewhere: Secondary | ICD-10-CM | POA: Diagnosis not present

## 2017-09-11 DIAGNOSIS — Z4781 Encounter for orthopedic aftercare following surgical amputation: Secondary | ICD-10-CM | POA: Diagnosis not present

## 2017-09-11 DIAGNOSIS — F419 Anxiety disorder, unspecified: Secondary | ICD-10-CM | POA: Diagnosis not present

## 2017-09-11 DIAGNOSIS — G7100 Muscular dystrophy, unspecified: Secondary | ICD-10-CM | POA: Diagnosis not present

## 2017-09-11 DIAGNOSIS — E1169 Type 2 diabetes mellitus with other specified complication: Secondary | ICD-10-CM | POA: Diagnosis not present

## 2017-09-11 DIAGNOSIS — Z89412 Acquired absence of left great toe: Secondary | ICD-10-CM | POA: Diagnosis not present

## 2017-09-13 ENCOUNTER — Encounter: Payer: Medicare HMO | Admitting: Sports Medicine

## 2017-09-20 ENCOUNTER — Encounter: Payer: Medicare HMO | Admitting: Sports Medicine

## 2017-09-27 ENCOUNTER — Ambulatory Visit (INDEPENDENT_AMBULATORY_CARE_PROVIDER_SITE_OTHER): Payer: Medicare HMO | Admitting: Sports Medicine

## 2017-09-27 ENCOUNTER — Encounter: Payer: Self-pay | Admitting: Sports Medicine

## 2017-09-27 VITALS — BP 132/61 | HR 66 | Temp 98.6°F | Resp 16

## 2017-09-27 DIAGNOSIS — I739 Peripheral vascular disease, unspecified: Secondary | ICD-10-CM

## 2017-09-27 DIAGNOSIS — Z9889 Other specified postprocedural states: Secondary | ICD-10-CM

## 2017-09-27 DIAGNOSIS — E1140 Type 2 diabetes mellitus with diabetic neuropathy, unspecified: Secondary | ICD-10-CM

## 2017-09-27 NOTE — Progress Notes (Signed)
Subjective: [name] is a 84 y.o. male patient seen today in office for POV #7 (DOS 07-18-17), S/P Left hallux + distal metatarsal amputation. Patient denies pain at surgical site, reports that the area is doing "great", denies calf pain, denies headache, chest pain, shortness of breath, nausea, vomiting, fever, or chills.  No other issues noted.   Patient is assisted by daughter this visit.   Patient Active Problem List   Diagnosis Date Noted  . Abnormal breath sounds 03/05/2017  . Chronic pain 02/05/2017  . Acute diastolic CHF (congestive heart failure) ([place]) 12/04/2016  . S/P shoulder replacement, left 11/16/2016  . Preoperative clearance 10/15/2016  . Hyperlipidemia 10/15/2016  . Diabetes ([place]) 03/10/2015  . Perennial and seasonal allergic rhinitis 12/20/2014  . Hematuria 12/20/2014  . Loss of weight 05/06/2014  . Other pancytopenia ([place]) 05/05/2014  . CAP (community acquired pneumonia) 05/05/2014  . Hypocalcemia 05/05/2014  . Skin lesion 04/30/2014  . 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 Abdominal pain, right upper quadrant 05/04/2013  . Cold intolerance 05/04/2013  . Low back pain 04/03/2012  . Encounter for long-term (current) use of other medications 12/11/2011  . Routine general medical examination at a health care facility 12/17/2010  . Screening for prostate cancer 12/12/2010  . Osteoarthrosis, unspecified whether generalized or localized, unspecified site 10/24/2010  . SKIN RASH 06/29/2009  . Iron deficiency anemia 03/10/2009  . BACK PAIN, LUMBAR 03/10/2009  . [place] DISEASE 05/03/2008  . DYSPNEA 05/03/2008  . DEPRESSIVE DISORDER NOT ELSEWHERE CLASSIFIED 05/22/2007  . CHEST PAIN 05/22/2007  . ANEMIA, PERNICIOUS 12/02/2006  . Essential hypertension 07/26/2006  . Coronary atherosclerosis 07/26/2006  . GERD 07/26/2006    Current Outpatient Medications on File Prior to Visit  Medication Sig Dispense Refill  . ACCU-CHEK SOFTCLIX  LANCETS lancets Used to check blood sugars daily. 100 each 12  . acetaminophen (TYLENOL) 500 MG tablet Take 1,000-1,500 mg 2 (two) times daily as needed by mouth for moderate pain.    . Alcohol Swabs (B-D SINGLE USE SWABS REGULAR) PADS Used to check blood sugars daily. 100 each 11  . Ascorbic Acid (VITAMIN C) 500 MG tablet Take 500 mg by mouth 2 (two) times daily.     Marland Kitchen aspirin (GOODSENSE ASPIRIN) 325 MG tablet Take 325 mg daily by mouth.     . Blood Glucose Calibration (ACCU-CHEK AVIVA) SOLN 1 Bottle by In Vitro route as needed. 1 each 0  . Blood Glucose Monitoring Suppl (ACCU-CHEK AVIVA) device Use as instructed 1 each 0  . cholecalciferol (VITAMIN D) 1000 units tablet Take 1,000 Units at bedtime by mouth.    . Cyanocobalamin (B-12 PO) Take 1 tablet daily by mouth.    . Cyanocobalamin (VITAMIN B-12 IJ) GET A B12 INJECTION ONCE EVERY MONTH     . docusate sodium (COLACE) 100 MG capsule Take 1 capsule (100 mg total) by mouth daily as needed. 30 capsule 2  . ferrous sulfate 325 (65 FE) MG tablet Take 325 mg 2 (two) times daily by mouth.    . gabapentin (NEURONTIN) 300 MG capsule Take 2 capsules (600 mg total) by mouth at bedtime. 180 capsule 3  . glucose blood (ACCU-CHEK AVIVA) test strip Used to check blood sugars daily. 100 each 12  . Inositol Niacinate (NIACIN FLUSH FREE) 500 MG CAPS Take 500 mg at bedtime by mouth.     Elmore Guise Devices (SIMPLE DIAGNOSTICS LANCING DEV) MISC   1  . metFORMIN (GLUCOPHAGE-XR) 500 MG 24 hr tablet Take 500  mg by mouth 2 (two) times daily.    . montelukast (SINGULAIR) 10 MG tablet TAKE 1 TABLET AT BEDTIME 90 tablet 3  . Multiple Vitamins-Minerals (MULTIVITAMIN WITH MINERALS) tablet Take 1 tablet by mouth daily.      . promethazine (PHENERGAN) 12.5 MG tablet Take 1 tablet (12.5 mg total) by mouth every 6 (six) hours as needed for nausea or vomiting. 30 tablet 0  . sertraline (ZOLOFT) 50 MG tablet TAKE 1 TABLET EVERY DAY 90 tablet 3  . simvastatin (ZOCOR) 80 MG tablet  Take 1 tablet (80 mg total) by mouth at bedtime. 90 tablet 3  . sulfamethoxazole-trimethoprim (BACTRIM DS,SEPTRA DS) 800-160 MG tablet Take 1 tablet by mouth 2 (two) times daily. 20 tablet 0  . sulfamethoxazole-trimethoprim (BACTRIM DS,SEPTRA DS) 800-160 MG tablet Take 1 tablet by mouth 2 (two) times daily. 14 tablet 0  . traMADol (ULTRAM) 50 MG tablet Take 1 tablet (50 mg total) by mouth every 12 (twelve) hours as needed. 20 tablet 0  . Zinc 50 MG CAPS Take 50 mg at bedtime by mouth.      No current facility-administered medications on file prior to visit.     No Known Allergies  Objective: There were no vitals filed for this visit.  General: No acute distress, AAOx3  Left foot: Surgical site and abrasion healed, mild swelling to left forefoot, decreased erythema, no warmth, no drainage, no other signs of infection noted, Capillary fill time <5 seconds in all remaining digits, no pain to palpation left foot, s/p 1st hallux + distal met amp,  No pain with calf compression.   Assessment and Plan:  Problem List Items Addressed This Visit    None    Visit Diagnoses    Post-operative state    -  Primary   Type 2 diabetes, controlled, with neuropathy ([place])       PVD (peripheral vascular disease) ([place])       S/P foot surgery, left          -Patient seen and evaluated -Continue with custom shoes and braces and rolling walker -D/c from post op care and return in 2 months for diabetic foot care -Recommend daily skin emollients for any dry skin and daily foot inspection in the setting of diabetes -Return in 8 to 10 weeks for routine diabetic foot care -In the meantime, patient to call office if any issues or problems arise.   Landis Martins, DPM

## 2017-10-05 DIAGNOSIS — J9610 Chronic respiratory failure, unspecified whether with hypoxia or hypercapnia: Secondary | ICD-10-CM | POA: Diagnosis not present

## 2017-10-05 DIAGNOSIS — G600 Hereditary motor and sensory neuropathy: Secondary | ICD-10-CM | POA: Diagnosis not present

## 2017-10-16 DIAGNOSIS — R262 Difficulty in walking, not elsewhere classified: Secondary | ICD-10-CM | POA: Diagnosis not present

## 2017-10-16 DIAGNOSIS — E559 Vitamin D deficiency, unspecified: Secondary | ICD-10-CM | POA: Diagnosis not present

## 2017-10-16 DIAGNOSIS — E782 Mixed hyperlipidemia: Secondary | ICD-10-CM | POA: Diagnosis not present

## 2017-10-16 DIAGNOSIS — Z23 Encounter for immunization: Secondary | ICD-10-CM | POA: Diagnosis not present

## 2017-10-16 DIAGNOSIS — E663 Overweight: Secondary | ICD-10-CM | POA: Diagnosis not present

## 2017-10-16 DIAGNOSIS — R5383 Other fatigue: Secondary | ICD-10-CM | POA: Diagnosis not present

## 2017-10-16 DIAGNOSIS — E119 Type 2 diabetes mellitus without complications: Secondary | ICD-10-CM | POA: Diagnosis not present

## 2017-10-16 DIAGNOSIS — E1165 Type 2 diabetes mellitus with hyperglycemia: Secondary | ICD-10-CM | POA: Diagnosis not present

## 2017-10-16 DIAGNOSIS — Z130 Encounter for screening for diseases of the blood and blood-forming organs and certain disorders involving the immune mechanism: Secondary | ICD-10-CM | POA: Diagnosis not present

## 2017-10-16 DIAGNOSIS — Z0189 Encounter for other specified special examinations: Secondary | ICD-10-CM | POA: Diagnosis not present

## 2017-10-23 ENCOUNTER — Encounter: Payer: Self-pay | Admitting: Sports Medicine

## 2017-10-23 NOTE — Progress Notes (Signed)
All office visit notes and corresponding records from 01 October 2012 - 16 August 2017 were mailed to [facility] in [place].

## 2017-11-05 DIAGNOSIS — G600 Hereditary motor and sensory neuropathy: Secondary | ICD-10-CM | POA: Diagnosis not present

## 2017-11-05 DIAGNOSIS — J9610 Chronic respiratory failure, unspecified whether with hypoxia or hypercapnia: Secondary | ICD-10-CM | POA: Diagnosis not present

## 2017-11-14 DIAGNOSIS — G8929 Other chronic pain: Secondary | ICD-10-CM | POA: Diagnosis not present

## 2017-11-14 DIAGNOSIS — E611 Iron deficiency: Secondary | ICD-10-CM | POA: Diagnosis not present

## 2017-11-14 DIAGNOSIS — G7100 Muscular dystrophy, unspecified: Secondary | ICD-10-CM | POA: Diagnosis not present

## 2017-11-14 DIAGNOSIS — E119 Type 2 diabetes mellitus without complications: Secondary | ICD-10-CM | POA: Diagnosis not present

## 2017-12-05 DIAGNOSIS — J9610 Chronic respiratory failure, unspecified whether with hypoxia or hypercapnia: Secondary | ICD-10-CM | POA: Diagnosis not present

## 2017-12-05 DIAGNOSIS — G600 Hereditary motor and sensory neuropathy: Secondary | ICD-10-CM | POA: Diagnosis not present

## 2017-12-13 ENCOUNTER — Encounter: Payer: Self-pay | Admitting: Sports Medicine

## 2017-12-13 ENCOUNTER — Ambulatory Visit (INDEPENDENT_AMBULATORY_CARE_PROVIDER_SITE_OTHER): Payer: Medicare HMO | Admitting: Sports Medicine

## 2017-12-13 VITALS — BP 149/47 | HR 61 | Resp 16

## 2017-12-13 DIAGNOSIS — I739 Peripheral vascular disease, unspecified: Secondary | ICD-10-CM

## 2017-12-13 DIAGNOSIS — Z89412 Acquired absence of left great toe: Secondary | ICD-10-CM

## 2017-12-13 DIAGNOSIS — M79675 Pain in left toe(s): Secondary | ICD-10-CM | POA: Diagnosis not present

## 2017-12-13 DIAGNOSIS — E1140 Type 2 diabetes mellitus with diabetic neuropathy, unspecified: Secondary | ICD-10-CM

## 2017-12-13 DIAGNOSIS — B351 Tinea unguium: Secondary | ICD-10-CM | POA: Diagnosis not present

## 2017-12-13 DIAGNOSIS — M79674 Pain in right toe(s): Secondary | ICD-10-CM

## 2017-12-13 DIAGNOSIS — L608 Other nail disorders: Secondary | ICD-10-CM

## 2017-12-13 NOTE — Progress Notes (Signed)
Subjective: [name] is a 84 y.o. male patient with history of diabetes who presents to office today complaining of long,mildly painful nails  while ambulating in shoes; unable to trim. Patient states that the glucose reading this morning was good.  Last A1c was 6.  Patient denies any new changes in medication or new problems besides feeling like there is rubbing when he is in his shoe at the lateral side of his right foot.  Patient Active Problem List   Diagnosis Date Noted  . Abnormal breath sounds 03/05/2017  . Chronic pain 02/05/2017  . Acute diastolic CHF (congestive heart failure) (HCC) 12/04/2016  . S/P shoulder replacement, left 11/16/2016  . Preoperative clearance 10/15/2016  . Hyperlipidemia 10/15/2016  . Diabetes (HCC) 03/10/2015  . Perennial and seasonal allergic rhinitis 12/20/2014  . Hematuria 12/20/2014  . Loss of weight 05/06/2014  . Other pancytopenia (HCC) 05/05/2014  . CAP (community acquired pneumonia) 05/05/2014  . Hypocalcemia 05/05/2014  . Skin lesion 04/30/2014  . 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 Abdominal pain, right upper quadrant 05/04/2013  . Cold intolerance 05/04/2013  . Low back pain 04/03/2012  . Encounter for long-term (current) use of other medications 12/11/2011  . Routine general medical examination at a health care facility 12/17/2010  . Screening for prostate cancer 12/12/2010  . Osteoarthrosis, unspecified whether generalized or localized, unspecified site 10/24/2010  . SKIN RASH 06/29/2009  . Iron deficiency anemia 03/10/2009  . BACK PAIN, LUMBAR 03/10/2009  . CHARCOT-MARIE-TOOTH DISEASE 05/03/2008  . DYSPNEA 05/03/2008  . DEPRESSIVE DISORDER NOT ELSEWHERE CLASSIFIED 05/22/2007  . CHEST PAIN 05/22/2007  . ANEMIA, PERNICIOUS 12/02/2006  . Essential hypertension 07/26/2006  . Coronary atherosclerosis 07/26/2006  . GERD 07/26/2006   Current Outpatient Medications on File Prior to Visit  Medication Sig Dispense Refill   . ACCU-CHEK SOFTCLIX LANCETS lancets Used to check blood sugars daily. 100 each 12  . acetaminophen (TYLENOL) 500 MG tablet Take 1,000-1,500 mg 2 (two) times daily as needed by mouth for moderate pain.    . Alcohol Swabs (B-D SINGLE USE SWABS REGULAR) PADS Used to check blood sugars daily. 100 each 11  . Ascorbic Acid (VITAMIN C) 500 MG tablet Take 500 mg by mouth 2 (two) times daily.     Marland Kitchen aspirin (GOODSENSE ASPIRIN) 325 MG tablet Take 325 mg daily by mouth.     . Blood Glucose Calibration (ACCU-CHEK AVIVA) SOLN 1 Bottle by In Vitro route as needed. 1 each 0  . Blood Glucose Monitoring Suppl (ACCU-CHEK AVIVA) device Use as instructed 1 each 0  . cholecalciferol (VITAMIN D) 1000 units tablet Take 1,000 Units at bedtime by mouth.    . Cyanocobalamin (B-12 PO) Take 1 tablet daily by mouth.    . Cyanocobalamin (VITAMIN B-12 IJ) GET A B12 INJECTION ONCE EVERY MONTH     . docusate sodium (COLACE) 100 MG capsule Take 1 capsule (100 mg total) by mouth daily as needed. 30 capsule 2  . ferrous sulfate 325 (65 FE) MG tablet Take 325 mg 2 (two) times daily by mouth.    . gabapentin (NEURONTIN) 300 MG capsule Take 2 capsules (600 mg total) by mouth at bedtime. 180 capsule 3  . glucose blood (ACCU-CHEK AVIVA) test strip Used to check blood sugars daily. 100 each 12  . Inositol Niacinate (NIACIN FLUSH FREE) 500 MG CAPS Take 500 mg at bedtime by mouth.     Demetra Shiner Devices (SIMPLE DIAGNOSTICS LANCING DEV) MISC   1  . metFORMIN (  GLUCOPHAGE-XR) 500 MG 24 hr tablet Take 500 mg by mouth 2 (two) times daily.    . montelukast (SINGULAIR) 10 MG tablet TAKE 1 TABLET AT BEDTIME 90 tablet 3  . Multiple Vitamins-Minerals (MULTIVITAMIN WITH MINERALS) tablet Take 1 tablet by mouth daily.      . promethazine (PHENERGAN) 12.5 MG tablet Take 1 tablet (12.5 mg total) by mouth every 6 (six) hours as needed for nausea or vomiting. 30 tablet 0  . sertraline (ZOLOFT) 50 MG tablet TAKE 1 TABLET EVERY DAY 90 tablet 3  .  simvastatin (ZOCOR) 80 MG tablet Take 1 tablet (80 mg total) by mouth at bedtime. 90 tablet 3  . sulfamethoxazole-trimethoprim (BACTRIM DS,SEPTRA DS) 800-160 MG tablet Take 1 tablet by mouth 2 (two) times daily. 20 tablet 0  . sulfamethoxazole-trimethoprim (BACTRIM DS,SEPTRA DS) 800-160 MG tablet Take 1 tablet by mouth 2 (two) times daily. 14 tablet 0  . traMADol (ULTRAM) 50 MG tablet Take 1 tablet (50 mg total) by mouth every 12 (twelve) hours as needed. 20 tablet 0  . Zinc 50 MG CAPS Take 50 mg at bedtime by mouth.      No current facility-administered medications on file prior to visit.    Allergies  Allergen Reactions  . Other     No results found for this or any previous visit (from the past 2160 hour(s)).  Objective: General: Patient is awake, alert, and oriented x 3 and in no acute distress.  Integument: Skin is warm, dry and supple bilateral. Nails are tender, long, thickened and  dystrophic with subungual debris, consistent with onychomycosis, 1-5 on right and 2-5 on left.  At the left second toe there is active bleeding and loosening of nail likely from patient bumping toe and not realizing it due to neuropathy.  There are no signs of infection. No open lesions or preulcerative lesions present bilateral. Remaining integument unremarkable. Amputation site well-healed.  Vasculature:  Dorsalis Pedis pulse 1/4 bilateral. Posterior Tibial pulse  0/4 bilateral.  1+ pitting edema bilateral and hyperpigmentation bilateral due to venous stasis.  Neurology: The patient has absent sensation measured with a 5.07/10g Semmes Weinstein Monofilament at all pedal sites bilateral.  Musculoskeletal: Status post left hallux amputation due to history of osteomyelitis.  Bone spur at the lateral foot on the right.  No tenderness with calf compression bilateral.  Assessment and Plan: Problem List Items Addressed This Visit    None    Visit Diagnoses    Pain due to onychomycosis of toenails of  both feet    -  Primary   Hemorrhage of nail       Type 2 diabetes, controlled, with neuropathy (HCC)       PVD (peripheral vascular disease) (HCC)       History of amputation of left great toe (HCC)          -Examined patient. -Discussed and educated patient on diabetic foot care, especially with  regards to the vascular, neurological and musculoskeletal systems.  -Stressed the importance of good glycemic control and the detriment of not  controlling glucose levels in relation to the foot. -Mechanically debrided all nails 1-5 on right and 2-5 on left using sterile nail nipper and filed with dremel without incident; applied Betadine to left second toenail bed and advised patient to do the same for 1 week -Applied moleskin to lateral foot on right and advised patient to have his custom shoes adjusted for likely possible rubbing at the lateral foot -Answered all patient  questions -Patient to return  in 2.5 months for at risk foot care -Patient advised to call the office if any problems or questions arise in the meantime.  Asencion Islamitorya Kylar Speelman, DPM

## 2018-01-05 DIAGNOSIS — G600 Hereditary motor and sensory neuropathy: Secondary | ICD-10-CM | POA: Diagnosis not present

## 2018-01-05 DIAGNOSIS — J9610 Chronic respiratory failure, unspecified whether with hypoxia or hypercapnia: Secondary | ICD-10-CM | POA: Diagnosis not present

## 2018-01-14 DIAGNOSIS — G600 Hereditary motor and sensory neuropathy: Secondary | ICD-10-CM | POA: Diagnosis not present

## 2018-01-20 DIAGNOSIS — E663 Overweight: Secondary | ICD-10-CM | POA: Diagnosis not present

## 2018-01-20 DIAGNOSIS — I10 Essential (primary) hypertension: Secondary | ICD-10-CM | POA: Diagnosis not present

## 2018-01-20 DIAGNOSIS — Z130 Encounter for screening for diseases of the blood and blood-forming organs and certain disorders involving the immune mechanism: Secondary | ICD-10-CM | POA: Diagnosis not present

## 2018-01-20 DIAGNOSIS — R0602 Shortness of breath: Secondary | ICD-10-CM | POA: Diagnosis not present

## 2018-01-20 DIAGNOSIS — Z0189 Encounter for other specified special examinations: Secondary | ICD-10-CM | POA: Diagnosis not present

## 2018-01-20 DIAGNOSIS — W19XXXA Unspecified fall, initial encounter: Secondary | ICD-10-CM | POA: Diagnosis not present

## 2018-01-20 DIAGNOSIS — E782 Mixed hyperlipidemia: Secondary | ICD-10-CM | POA: Diagnosis not present

## 2018-01-20 DIAGNOSIS — E119 Type 2 diabetes mellitus without complications: Secondary | ICD-10-CM | POA: Diagnosis not present

## 2018-01-20 DIAGNOSIS — R5383 Other fatigue: Secondary | ICD-10-CM | POA: Diagnosis not present

## 2018-01-20 DIAGNOSIS — R52 Pain, unspecified: Secondary | ICD-10-CM | POA: Diagnosis not present

## 2018-02-05 DIAGNOSIS — J9610 Chronic respiratory failure, unspecified whether with hypoxia or hypercapnia: Secondary | ICD-10-CM | POA: Diagnosis not present

## 2018-02-05 DIAGNOSIS — G600 Hereditary motor and sensory neuropathy: Secondary | ICD-10-CM | POA: Diagnosis not present

## 2018-02-13 ENCOUNTER — Ambulatory Visit: Payer: Medicare HMO | Admitting: Cardiovascular Disease

## 2018-02-13 ENCOUNTER — Encounter: Payer: Self-pay | Admitting: Cardiovascular Disease

## 2018-02-13 VITALS — BP 108/74 | HR 61 | Ht 70.0 in | Wt 191.0 lb

## 2018-02-13 DIAGNOSIS — I5032 Chronic diastolic (congestive) heart failure: Secondary | ICD-10-CM | POA: Diagnosis not present

## 2018-02-13 DIAGNOSIS — I2510 Atherosclerotic heart disease of native coronary artery without angina pectoris: Secondary | ICD-10-CM | POA: Diagnosis not present

## 2018-02-13 DIAGNOSIS — I471 Supraventricular tachycardia: Secondary | ICD-10-CM | POA: Diagnosis not present

## 2018-02-13 DIAGNOSIS — I10 Essential (primary) hypertension: Secondary | ICD-10-CM | POA: Diagnosis not present

## 2018-02-13 NOTE — Patient Instructions (Signed)
Medication Instructions:  Your physician recommends that you continue on your current medications as directed. Please refer to the Current Medication list given to you today.  If you need a refill on your cardiac medications before your next appointment, please call your pharmacy.   Lab work: none If you have labs (blood work) drawn today and your tests are completely normal, you will receive your results only by: . MyChart Message (if you have MyChart) OR . A paper copy in the mail If you have any lab test that is abnormal or we need to change your treatment, we will call you to review the results.  Testing/Procedures: none  Follow-Up: At [facility], you and your health needs are our priority.  As part of our continuing mission to provide you with exceptional heart care, we have created designated Provider Care Teams.  These Care Teams include your primary Cardiologist (physician) and Advanced Practice Providers (APPs -  Physician Assistants and Nurse Practitioners) who all work together to provide you with the care you need, when you need it. You will need a follow up appointment in 6 months.  Please call our office 4 months in advance to schedule this appointment.  You may see Christopher McAlhany, MD or one of the following Advanced Practice Providers on your designated Care Team:   Brittainy Simmons, PA-C Dayna Dunn, PA-C . Michele Lenze, PA-C  Any Other Special Instructions Will Be Listed Below (If Applicable).    

## 2018-02-13 NOTE — Progress Notes (Signed)
Chief Complaint  Patient presents with  . Follow-up    CAD   History of Present Illness: 85 yo male with history of chronic diastolic CHF, CAD with 5V CABG in 1995 with most recent cath 2006, DM, HTN, former smoker, Charcot Marie tooth syndrome who is here today for cardiac follow up. I met him in April 2012 and he had c/o chest pains. Stress myoview April 2012 with no ischemia. I saw him 12/24/12 and there was question of atrial fibrillation in primary care. I arranged a 48 hour monitor which showed sinus rhythm with frequent PACs, several runs of SVT. Echo 01/12/13 with normal LV size and function, mild AI and TR. Probable mild pulm HTN. He could not lay flat for stress myoview images due to diaphragm issues, chronic problem. Seen by Dr. Melvyn Novas and note states he has no lung issues. He was treated for GERD. He underwent a shoulder surgery in November 2018 and had volume overload following the procedure. He was diuresed with Lasix. Echo December 2018 with normal LV size and normal LV systolic function, ERXV=40-08%. There was mild aortic valve disease with thickened leaflets and mild AI. Following diuresis with daily Lasix, he had dizziness so Lasix cut back to as needed dosing.   He is here today for follow up. The patient denies any palpitations, lower extremity edema, orthopnea, PND, dizziness, near syncope or syncope. He is mostly immobile. He has had a recent URI. He has had pleuritic chest pain several times per week over the past few weeks. Pain lasts for 2-3 minutes. Worsens with inspiration. He had fever a few weeks back but feeling much better.   Primary Care Physician: Welford Roche, NP  Past Medical History:  Diagnosis Date  . ANEMIA, IRON DEFICIENCY   . Arthritis   . BACK PAIN, LUMBAR   . Charcot-Marie-Tooth disease    assoiciated muscular dystrophy   . COPD (chronic obstructive pulmonary disease) ([place])   . Coronary artery disease   . DIABETES MELLITUS, TYPE II   . GERD  (gastroesophageal reflux disease)   . H/O: asbestos exposure   . Hypertension   . Leukopenia   . Myocardial infarction ([place])   . Pernicious anemia   . Pneumonia   . Sleep apnea    uses CPAP nightly  . Smoker    quit 1968--25 ppy    Past Surgical History:  Procedure Laterality Date  . CATARACT EXTRACTION W/ INTRAOCULAR LENS  IMPLANT, BILATERAL    . CORONARY ARTERY BYPASS GRAFT  1995  . CYST EXCISION     on right shoulder  . GRAFT APPLICATION Left 6/76/1950   Procedure: EpiCord GRAFT APPLICATION OF LEFT FOOT;  Surgeon: Landis Martins, DPM;  Location: [facility];  Service: Podiatry;  Laterality: Left;  . INCISION AND DRAINAGE OF WOUND Left 03/18/2017   Procedure: IRRIGATION AND DEBRIDEMENT WOUND OF LEFT FOOT;  Surgeon: Landis Martins, DPM;  Location: [facility];  Service: Podiatry;  Laterality: Left;  . REVERSE SHOULDER ARTHROPLASTY Left 11/16/2016   Procedure: LEFT REVERSE SHOULDER ARTHROPLASTY;  Surgeon: Netta Cedars, MD;  Location: [facility];  Service: Orthopedics;  Laterality: Left;  . SESAMOIDECTOMY Left 03/18/2017   Procedure: SESAMOIDECTOMY OF LEFT FOOT;  Surgeon: Landis Martins, DPM;  Location: [facility];  Service: Podiatry;  Laterality: Left;    Current Outpatient Medications  Medication Sig Dispense Refill  . ACCU-CHEK SOFTCLIX LANCETS lancets Used to check blood sugars daily. 100 each 12  . acetaminophen (TYLENOL)  500 MG tablet Take 1,000-1,500 mg 2 (two) times daily as needed by mouth for moderate pain.    . Alcohol Swabs (B-D SINGLE USE SWABS REGULAR) PADS Used to check blood sugars daily. 100 each 11  . Ascorbic Acid (VITAMIN C) 500 MG tablet Take 500 mg by mouth 2 (two) times daily.     Marland Kitchen aspirin (GOODSENSE ASPIRIN) 325 MG tablet Take 325 mg daily by mouth.     . Blood Glucose Calibration (ACCU-CHEK AVIVA) SOLN 1 Bottle by In Vitro route as needed. 1 each 0  . cholecalciferol (VITAMIN D) 1000 units tablet Take 1,000 Units at  bedtime by mouth.    . Cyanocobalamin (B-12 PO) Take 1 tablet daily by mouth.    . docusate sodium (COLACE) 100 MG capsule Take 1 capsule (100 mg total) by mouth daily as needed. 30 capsule 2  . ferrous sulfate 325 (65 FE) MG tablet Take 325 mg 2 (two) times daily by mouth.    . gabapentin (NEURONTIN) 300 MG capsule Take 2 capsules (600 mg total) by mouth at bedtime. 180 capsule 3  . glucose blood (ACCU-CHEK AVIVA) test strip Used to check blood sugars daily. 100 each 12  . Inositol Niacinate (NIACIN FLUSH FREE) 500 MG CAPS Take 500 mg at bedtime by mouth.     Elmore Guise Devices (SIMPLE DIAGNOSTICS LANCING DEV) MISC   1  . metFORMIN (GLUCOPHAGE-XR) 500 MG 24 hr tablet Take 500 mg by mouth 2 (two) times daily.    . montelukast (SINGULAIR) 10 MG tablet TAKE 1 TABLET AT BEDTIME 90 tablet 3  . Multiple Vitamins-Minerals (MULTIVITAMIN WITH MINERALS) tablet Take 1 tablet by mouth daily.      . promethazine (PHENERGAN) 12.5 MG tablet Take 1 tablet (12.5 mg total) by mouth every 6 (six) hours as needed for nausea or vomiting. 30 tablet 0  . sertraline (ZOLOFT) 50 MG tablet TAKE 1 TABLET EVERY DAY 90 tablet 3  . simvastatin (ZOCOR) 80 MG tablet Take 1 tablet (80 mg total) by mouth at bedtime. 90 tablet 3  . traMADol (ULTRAM) 50 MG tablet Take 1 tablet (50 mg total) by mouth every 12 (twelve) hours as needed. 20 tablet 0  . Zinc 50 MG CAPS Take 50 mg at bedtime by mouth.      No current facility-administered medications for this visit.     Allergies  Allergen Reactions  . Other     Social History   Socioeconomic History  . Marital status: Widowed    Spouse name: Not on file  . Number of children: Not on file  . Years of education: Not on file  . Highest education level: Not on file  Occupational History  . Occupation: Retired    Fish farm manager: RETIRED    Comment: Engineer, building services  Social Needs  . Financial resource strain: Not on file  . Food insecurity:    Worry: Not on file    Inability: Not  on file  . Transportation needs:    Medical: Not on file    Non-medical: Not on file  Tobacco Use  . Smoking status: Former Smoker    Packs/day: 1.00    Years: 20.00    Pack years: 20.00    Types: Cigarettes    Last attempt to quit: 01/01/1966    Years since quitting: 52.1  . Smokeless tobacco: Never Used  Substance and Sexual Activity  . Alcohol use: No    Alcohol/week: 0.0 standard drinks  . Drug use: No  .  Sexual activity: Not on file  Lifestyle  . Physical activity:    Days per week: Not on file    Minutes per session: Not on file  . Stress: Not on file  Relationships  . Social connections:    Talks on phone: Not on file    Gets together: Not on file    Attends religious service: Not on file    Active member of club or organization: Not on file    Attends meetings of clubs or organizations: Not on file    Relationship status: Not on file  . Intimate partner violence:    Fear of current or ex partner: Not on file    Emotionally abused: Not on file    Physically abused: Not on file    Forced sexual activity: Not on file  Other Topics Concern  . Not on file  Social History Narrative   Was in service during [place] War.  Lives alone in a one story home.  On disability.         Family History  Problem Relation Age of Onset  . Charcot-Marie-Tooth disease Sister   . Charcot-Marie-Tooth disease Brother   . Charcot-Marie-Tooth disease Brother   . Charcot-Marie-Tooth disease Brother   . Charcot-Marie-Tooth disease Sister   . Charcot-Marie-Tooth disease Son   . Charcot-Marie-Tooth disease Other   . COPD Daughter        never smoker    Review of Systems:  As stated in the HPI and otherwise negative.   BP 108/74   Pulse 61   Ht '5\' 10"'$  (1.778 m)   Wt 191 lb (86.6 kg)   SpO2 97%   BMI 27.41 kg/m   Physical Examination:  General: Well developed, well nourished, NAD  HEENT: OP clear, mucus membranes moist  SKIN: warm, dry. No rashes. Neuro: No focal deficits    Musculoskeletal: Muscle strength 5/5 all ext  Psychiatric: Mood and affect normal  Neck: No JVD, no carotid bruits, no thyromegaly, no lymphadenopathy.  Lungs:Clear bilaterally, no wheezes, rhonci, crackles Cardiovascular: Regular rate and rhythm. No murmurs, gallops or rubs. Abdomen:Soft. Bowel sounds present. Non-tender.  Extremities: No lower extremity edema. Pulses are 2 + in the bilateral DP/PT.  Echo 12/17/16: - Left ventricle: The cavity size was normal. Wall thickness was   normal. Systolic function was normal. The estimated ejection   fraction was in the range of 60% to 65%. Wall motion was normal;   there were no regional wall motion abnormalities. Left   ventricular diastolic function parameters were normal for the   patient&'s age. - Aortic valve: Moderately calcified annulus. Mildly thickened,   mildly calcified leaflets. There was mild regurgitation. Valve   area (VTI): 2.04 cm^2. Valve area (Vmax): 2.02 cm^2. Valve area   (Vmean): 1.85 cm^2. - Right atrium: The atrium was mildly dilated.  EKG:  EKG is ordered today. The ekg ordered today demonstrates NSR, rate 61 bpm.   Recent Labs: 03/05/2017: ALT 15; BUN 26; Creatinine, Ser 0.77; Hemoglobin 11.8; Platelets 182.0; Potassium 4.9; Sodium 138; TSH 2.29   Lipid Panel    Component Value Date/Time   CHOL 150 03/05/2017 1344   TRIG 109.0 03/05/2017 1344   HDL 54.20 03/05/2017 1344   CHOLHDL 3 03/05/2017 1344   VLDL 21.8 03/05/2017 1344   LDLCALC 74 03/05/2017 1344     Wt Readings from Last 3 Encounters:  02/13/18 191 lb (86.6 kg)  03/18/17 188 lb (85.3 kg)  03/05/17 188 lb (85.3 kg)  Other studies Reviewed: Additional studies/ records that were reviewed today include: . Review of the above records demonstrates:    Assessment and Plan:    1. CAD s/p CABG without angina: He has been having chest pain with inspiration over the past two weeks following an upper respiratory infection. No exertional chest  pain. His pain is felt to be atypcial. He is s/p CABG in 1995. Continue ASA and statin. No beta blocker with bradycardia.  No ischemic workup. He cannot undergo stress testing as he cannot lay flat on the table due to back issues and trouble breathing due to his diaphragm. He would be a poor candidate for cardiac cath given his multiple chronic issues.   2. SVT: No palpitations  3. Chronic diastolic CHF: Weight is stable. No volume overload. Lasix as needed.   4. HTN: BP is controlled. No changes  Current medicines are reviewed at length with the patient today.  The patient does not have concerns regarding medicines.  The following changes have been made:  no change  Labs/ tests ordered today include:   Orders Placed This Encounter  Procedures  . EKG 12-Lead    Disposition:   FU with me in 12  months  Signed, Lauree Chandler, MD 02/13/2018 4:43 PM    [facility] [address], [place], [place]  [address] Phone: [phone]; Fax: [phone]

## 2018-02-19 ENCOUNTER — Ambulatory Visit: Payer: Medicare HMO | Admitting: Sports Medicine

## 2018-02-19 ENCOUNTER — Encounter: Payer: Self-pay | Admitting: Sports Medicine

## 2018-02-19 VITALS — BP 139/80 | HR 70 | Resp 16

## 2018-02-19 DIAGNOSIS — B351 Tinea unguium: Secondary | ICD-10-CM

## 2018-02-19 DIAGNOSIS — Z89412 Acquired absence of left great toe: Secondary | ICD-10-CM

## 2018-02-19 DIAGNOSIS — S90415A Abrasion, left lesser toe(s), initial encounter: Secondary | ICD-10-CM

## 2018-02-19 DIAGNOSIS — M79675 Pain in left toe(s): Secondary | ICD-10-CM | POA: Diagnosis not present

## 2018-02-19 DIAGNOSIS — M79674 Pain in right toe(s): Secondary | ICD-10-CM | POA: Diagnosis not present

## 2018-02-19 DIAGNOSIS — E1140 Type 2 diabetes mellitus with diabetic neuropathy, unspecified: Secondary | ICD-10-CM

## 2018-02-19 DIAGNOSIS — I739 Peripheral vascular disease, unspecified: Secondary | ICD-10-CM

## 2018-02-19 DIAGNOSIS — S90414A Abrasion, right lesser toe(s), initial encounter: Secondary | ICD-10-CM

## 2018-02-19 NOTE — Progress Notes (Signed)
Subjective: [name] is a 85 y.o. male patient with history of diabetes who presents to office today complaining of long,mildly painful nails  while ambulating in shoes; unable to trim. Patient states that the glucose reading this morning was good has not checked for today.  Last A1c was 6.1.  Patient denies any new changes in medication or new problems besides swelling and daughter admitting to a history of a fall.  Patient states that his last visit to his primary care doctor was 2 weeks ago and will call back again on tomorrow.  No other issues noted.  Patient Active Problem List   Diagnosis Date Noted  . Abnormal breath sounds 03/05/2017  . Chronic pain 02/05/2017  . Acute diastolic CHF (congestive heart failure) (HCC) 12/04/2016  . S/P shoulder replacement, left 11/16/2016  . Preoperative clearance 10/15/2016  . Hyperlipidemia 10/15/2016  . Diabetes (HCC) 03/10/2015  . Perennial and seasonal allergic rhinitis 12/20/2014  . Hematuria 12/20/2014  . Loss of weight 05/06/2014  . Other pancytopenia (HCC) 05/05/2014  . CAP (community acquired pneumonia) 05/05/2014  . Hypocalcemia 05/05/2014  . Skin lesion 04/30/2014  . 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 Abdominal pain, right upper quadrant 05/04/2013  . Cold intolerance 05/04/2013  . Low back pain 04/03/2012  . Encounter for long-term (current) use of other medications 12/11/2011  . Routine general medical examination at a health care facility 12/17/2010  . Screening for prostate cancer 12/12/2010  . Osteoarthrosis, unspecified whether generalized or localized, unspecified site 10/24/2010  . SKIN RASH 06/29/2009  . Iron deficiency anemia 03/10/2009  . BACK PAIN, LUMBAR 03/10/2009  . CHARCOT-MARIE-TOOTH DISEASE 05/03/2008  . DYSPNEA 05/03/2008  . DEPRESSIVE DISORDER NOT ELSEWHERE CLASSIFIED 05/22/2007  . CHEST PAIN 05/22/2007  . ANEMIA, PERNICIOUS 12/02/2006  . Essential hypertension 07/26/2006  . Coronary  atherosclerosis 07/26/2006  . GERD 07/26/2006   Current Outpatient Medications on File Prior to Visit  Medication Sig Dispense Refill  . ACCU-CHEK SOFTCLIX LANCETS lancets Used to check blood sugars daily. 100 each 12  . acetaminophen (TYLENOL) 500 MG tablet Take 1,000-1,500 mg 2 (two) times daily as needed by mouth for moderate pain.    . Alcohol Swabs (B-D SINGLE USE SWABS REGULAR) PADS Used to check blood sugars daily. 100 each 11  . Ascorbic Acid (VITAMIN C) 500 MG tablet Take 500 mg by mouth 2 (two) times daily.     Marland Kitchen aspirin (GOODSENSE ASPIRIN) 325 MG tablet Take 325 mg daily by mouth.     . Blood Glucose Calibration (ACCU-CHEK AVIVA) SOLN 1 Bottle by In Vitro route as needed. 1 each 0  . cholecalciferol (VITAMIN D) 1000 units tablet Take 1,000 Units at bedtime by mouth.    . Cyanocobalamin (B-12 PO) Take 1 tablet daily by mouth.    . docusate sodium (COLACE) 100 MG capsule Take 1 capsule (100 mg total) by mouth daily as needed. 30 capsule 2  . ferrous sulfate 325 (65 FE) MG tablet Take 325 mg 2 (two) times daily by mouth.    . gabapentin (NEURONTIN) 300 MG capsule Take 2 capsules (600 mg total) by mouth at bedtime. 180 capsule 3  . glucose blood (ACCU-CHEK AVIVA) test strip Used to check blood sugars daily. 100 each 12  . Inositol Niacinate (NIACIN FLUSH FREE) 500 MG CAPS Take 500 mg at bedtime by mouth.     Demetra Shiner Devices (SIMPLE DIAGNOSTICS LANCING DEV) MISC   1  . metFORMIN (GLUCOPHAGE-XR) 500 MG 24 hr tablet Take  500 mg by mouth 2 (two) times daily.    . montelukast (SINGULAIR) 10 MG tablet TAKE 1 TABLET AT BEDTIME 90 tablet 3  . Multiple Vitamins-Minerals (MULTIVITAMIN WITH MINERALS) tablet Take 1 tablet by mouth daily.      . promethazine (PHENERGAN) 12.5 MG tablet Take 1 tablet (12.5 mg total) by mouth every 6 (six) hours as needed for nausea or vomiting. 30 tablet 0  . sertraline (ZOLOFT) 50 MG tablet TAKE 1 TABLET EVERY DAY 90 tablet 3  . simvastatin (ZOCOR) 80 MG tablet  Take 1 tablet (80 mg total) by mouth at bedtime. 90 tablet 3  . traMADol (ULTRAM) 50 MG tablet Take 1 tablet (50 mg total) by mouth every 12 (twelve) hours as needed. 20 tablet 0  . Zinc 50 MG CAPS Take 50 mg at bedtime by mouth.      No current facility-administered medications on file prior to visit.    Allergies  Allergen Reactions  . Other     No results found for this or any previous visit (from the past 2160 hour(s)).  Objective: General: Patient is awake, alert, and oriented x 3 and in no acute distress.  Integument: Skin is warm, dry and supple bilateral. Nails are tender, long, thickened and  dystrophic with subungual debris, consistent with onychomycosis, 1-5 on right and 2-5 on left.  At the lesser toes there are mild abrasions from previous fall without signs of infection. Remaining integument unremarkable. Amputation site well-healed.  Vasculature:  Dorsalis Pedis pulse 1/4 bilateral. Posterior Tibial pulse  0/4 bilateral.  1+ pitting edema bilateral and hyperpigmentation bilateral due to venous stasis.  Neurology: The patient has absent sensation measured with a 5.07/10g Semmes Weinstein Monofilament at all pedal sites bilateral.  Musculoskeletal: Status post left hallux amputation due to history of osteomyelitis.  Bone spur at the lateral foot on the right.  No tenderness with calf compression bilateral.  Assessment and Plan: Problem List Items Addressed This Visit    None    Visit Diagnoses    Pain due to onychomycosis of toenails of both feet    -  Primary   Abrasion of toe of right foot, initial encounter       Abrasion of toe of left foot, initial encounter       Type 2 diabetes, controlled, with neuropathy (HCC)       PVD (peripheral vascular disease) (HCC)       History of amputation of left great toe (HCC)          -Examined patient. -Discussed and educated patient on diabetic foot care, especially with  regards to the vascular, neurological and  musculoskeletal systems.  -Encourage elevation of legs to assist with edema control and to discuss with PCP other alternatives for fluid/edema management -Mechanically debrided all nails 1-5 on right and 2-5 on left using sterile nail nipper and filed with dremel without incident -Advised patient to use walker and assistive devices to help with stability in gait -Answered all patient questions -Patient to return  in 3 months for at risk foot care -Patient advised to call the office if any problems or questions arise in the meantime.  Asencion Islam, DPM

## 2018-02-20 DIAGNOSIS — E119 Type 2 diabetes mellitus without complications: Secondary | ICD-10-CM | POA: Diagnosis not present

## 2018-02-20 DIAGNOSIS — J018 Other acute sinusitis: Secondary | ICD-10-CM | POA: Diagnosis not present

## 2018-02-20 DIAGNOSIS — R05 Cough: Secondary | ICD-10-CM | POA: Diagnosis not present

## 2018-03-06 DIAGNOSIS — G600 Hereditary motor and sensory neuropathy: Secondary | ICD-10-CM | POA: Diagnosis not present

## 2018-03-06 DIAGNOSIS — J9610 Chronic respiratory failure, unspecified whether with hypoxia or hypercapnia: Secondary | ICD-10-CM | POA: Diagnosis not present

## 2018-03-13 ENCOUNTER — Emergency Department (HOSPITAL_COMMUNITY)
Admission: EM | Admit: 2018-03-13 | Discharge: 2018-03-13 | Disposition: A | Discharge status: Home / Self Care | Payer: Medicare HMO | Attending: Emergency Medicine | Admitting: Emergency Medicine

## 2018-03-13 ENCOUNTER — Encounter (HOSPITAL_COMMUNITY): Payer: Self-pay | Admitting: Emergency Medicine

## 2018-03-13 ENCOUNTER — Emergency Department (HOSPITAL_COMMUNITY): Payer: Medicare HMO

## 2018-03-13 ENCOUNTER — Emergency Department (HOSPITAL_BASED_OUTPATIENT_CLINIC_OR_DEPARTMENT_OTHER): Payer: Medicare HMO

## 2018-03-13 ENCOUNTER — Other Ambulatory Visit: Payer: Self-pay

## 2018-03-13 DIAGNOSIS — G7100 Muscular dystrophy, unspecified: Secondary | ICD-10-CM | POA: Diagnosis not present

## 2018-03-13 DIAGNOSIS — L538 Other specified erythematous conditions: Secondary | ICD-10-CM

## 2018-03-13 DIAGNOSIS — I252 Old myocardial infarction: Secondary | ICD-10-CM | POA: Insufficient documentation

## 2018-03-13 DIAGNOSIS — Z7982 Long term (current) use of aspirin: Secondary | ICD-10-CM | POA: Insufficient documentation

## 2018-03-13 DIAGNOSIS — I5032 Chronic diastolic (congestive) heart failure: Secondary | ICD-10-CM | POA: Diagnosis not present

## 2018-03-13 DIAGNOSIS — Z7984 Long term (current) use of oral hypoglycemic drugs: Secondary | ICD-10-CM | POA: Insufficient documentation

## 2018-03-13 DIAGNOSIS — R0600 Dyspnea, unspecified: Secondary | ICD-10-CM | POA: Diagnosis not present

## 2018-03-13 DIAGNOSIS — R0789 Other chest pain: Secondary | ICD-10-CM | POA: Diagnosis not present

## 2018-03-13 DIAGNOSIS — R079 Chest pain, unspecified: Secondary | ICD-10-CM | POA: Diagnosis not present

## 2018-03-13 DIAGNOSIS — M7989 Other specified soft tissue disorders: Secondary | ICD-10-CM | POA: Diagnosis not present

## 2018-03-13 DIAGNOSIS — R2241 Localized swelling, mass and lump, right lower limb: Secondary | ICD-10-CM | POA: Insufficient documentation

## 2018-03-13 DIAGNOSIS — R0602 Shortness of breath: Secondary | ICD-10-CM | POA: Diagnosis not present

## 2018-03-13 DIAGNOSIS — R001 Bradycardia, unspecified: Secondary | ICD-10-CM | POA: Diagnosis not present

## 2018-03-13 DIAGNOSIS — Z87891 Personal history of nicotine dependence: Secondary | ICD-10-CM | POA: Diagnosis not present

## 2018-03-13 DIAGNOSIS — I2510 Atherosclerotic heart disease of native coronary artery without angina pectoris: Secondary | ICD-10-CM | POA: Diagnosis not present

## 2018-03-13 DIAGNOSIS — J449 Chronic obstructive pulmonary disease, unspecified: Secondary | ICD-10-CM | POA: Diagnosis not present

## 2018-03-13 DIAGNOSIS — I110 Hypertensive heart disease with heart failure: Secondary | ICD-10-CM | POA: Insufficient documentation

## 2018-03-13 DIAGNOSIS — R609 Edema, unspecified: Secondary | ICD-10-CM | POA: Diagnosis not present

## 2018-03-13 DIAGNOSIS — G8929 Other chronic pain: Secondary | ICD-10-CM | POA: Diagnosis not present

## 2018-03-13 DIAGNOSIS — Z79899 Other long term (current) drug therapy: Secondary | ICD-10-CM | POA: Insufficient documentation

## 2018-03-13 DIAGNOSIS — E119 Type 2 diabetes mellitus without complications: Secondary | ICD-10-CM | POA: Diagnosis not present

## 2018-03-13 LAB — I-STAT TROPONIN, ED
Troponin i, poc: 0.01 ng/mL (ref 0.00–0.08)
Troponin i, poc: 0.01 ng/mL (ref 0.00–0.08)

## 2018-03-13 LAB — CBC
HCT: 37.2 % — ABNORMAL LOW (ref 39.0–52.0)
Hemoglobin: 11.5 g/dL — ABNORMAL LOW (ref 13.0–17.0)
MCH: 29.5 pg (ref 26.0–34.0)
MCHC: 30.9 g/dL (ref 30.0–36.0)
MCV: 95.4 fL (ref 80.0–100.0)
Platelets: 165 10*3/uL (ref 150–400)
RBC: 3.9 MIL/uL — ABNORMAL LOW (ref 4.22–5.81)
RDW: 12.8 % (ref 11.5–15.5)
WBC: 5.5 10*3/uL (ref 4.0–10.5)
nRBC: 0 % (ref 0.0–0.2)

## 2018-03-13 LAB — BASIC METABOLIC PANEL
Anion gap: 8 (ref 5–15)
BUN: 30 mg/dL — ABNORMAL HIGH (ref 8–23)
CALCIUM: 9.6 mg/dL (ref 8.9–10.3)
CO2: 27 mmol/L (ref 22–32)
Chloride: 107 mmol/L (ref 98–111)
Creatinine, Ser: 0.83 mg/dL (ref 0.61–1.24)
GFR calc Af Amer: >60 mL/min (ref >60)
GFR calc non Af Amer: >60 mL/min (ref >60)
Glucose, Bld: 91 mg/dL (ref 70–99)
Potassium: 4.5 mmol/L (ref 3.5–5.1)
Sodium: 142 mmol/L (ref 135–145)

## 2018-03-13 LAB — BRAIN NATRIURETIC PEPTIDE: B Natriuretic Peptide: 107.2 pg/mL — ABNORMAL HIGH (ref 0.0–100.0)

## 2018-03-13 MED ORDER — SODIUM CHLORIDE 0.9% FLUSH: 3.0000 mL | Freq: Once | INTRAVENOUS | Status: DC

## 2018-03-13 NOTE — ED Notes (Signed)
Vascular at bedside

## 2018-03-13 NOTE — Progress Notes (Signed)
RLE venous duplex       has been completed. Preliminary results can be found under CV proc through chart review. Tyrie Porzio, BS, RDMS, RVT   

## 2018-03-13 NOTE — ED Notes (Signed)
Patient transported to x-ray. ?

## 2018-03-13 NOTE — ED Provider Notes (Signed)
[facility] EMERGENCY DEPARTMENT Provider Note   CSN: [id number] Arrival date & time: 03/13/18  1114    History   Chief Complaint Chief Complaint  Patient presents with   Chest Pain    HPI [name] is a 85 y.o. male.     [name] is a 85 y.o. male with a history of CAD, COPD, DM, HTN, Charcot-Marie-Tooth Disease and anemia, who presents for evaluation of chest pain.  Patient presents reporting intermittent left-sided chest pains.  Patient has had these pains intermittently over the past few months, but reports over the past few days they have been worse and more persistent, although today he is currently chest free.  He was sent by his PCP for further evaluation.  Pains come intermittently.  They are not worse with exertion they are not pleuritic in nature.  No radiation to the arm neck or jaw.  No associated diaphoresis, nausea or vomiting.  No lightheadedness or syncope.  Patient reports some occasional shortness of breath.  No productive cough or fever.  Patient has noted some swelling in his right lower extremity, this occurs intermittently.  He does report that he had surgery on the left leg to help with swelling and has not had a units extremity.  He denies pain redness or warmth to the leg.  No swelling elsewhere.  Patient is followed by Dr. Clifton James with cardiology.  He has been seen previously for the chest pain and it was felt to be very atypical and unlikely cardiac.  Because of patient's muscular dystrophy he is unable to lay flat for imaging studies or stress testing.     Past Medical History:  Diagnosis Date   ANEMIA, IRON DEFICIENCY    Arthritis    BACK PAIN, LUMBAR    Charcot-Marie-Tooth disease    assoiciated muscular dystrophy    COPD (chronic obstructive pulmonary disease) (HCC)    Coronary artery disease    DIABETES MELLITUS, TYPE II    GERD (gastroesophageal reflux disease)    H/O: asbestos exposure    Hypertension     Leukopenia    Myocardial infarction (HCC)    Pernicious anemia    Pneumonia    Sleep apnea    uses CPAP nightly   Smoker    quit 1968--25 ppy    Patient Active Problem List   Diagnosis Date Noted   Abnormal breath sounds 03/05/2017   Chronic pain 02/05/2017   Acute diastolic CHF (congestive heart failure) (HCC) 12/04/2016   S/P shoulder replacement, left 11/16/2016   Preoperative clearance 10/15/2016   Hyperlipidemia 10/15/2016   Diabetes (HCC) 03/10/2015   Perennial and seasonal allergic rhinitis 12/20/2014   Hematuria 12/20/2014   Loss of weight 05/06/2014   Other pancytopenia (HCC) 05/05/2014   CAP (community acquired pneumonia) 05/05/2014   Hypocalcemia 05/05/2014   Skin lesion 04/30/2014   Charcot-Marie-Tooth disease type 1A 04/19/2014   Abdominal pain, right upper quadrant 05/04/2013   Cold intolerance 05/04/2013   Low back pain 04/03/2012   Encounter for long-term (current) use of other medications 12/11/2011   Routine general medical examination at a health care facility 12/17/2010   Screening for prostate cancer 12/12/2010   Osteoarthrosis, unspecified whether generalized or localized, unspecified site 10/24/2010   SKIN RASH 06/29/2009   Iron deficiency anemia 03/10/2009   BACK PAIN, LUMBAR 03/10/2009   CHARCOT-MARIE-TOOTH DISEASE 05/03/2008   DYSPNEA 05/03/2008   DEPRESSIVE DISORDER NOT ELSEWHERE CLASSIFIED 05/22/2007   CHEST PAIN 05/22/2007   ANEMIA,  PERNICIOUS 12/02/2006   Essential hypertension 07/26/2006   Coronary atherosclerosis 07/26/2006   GERD 07/26/2006    Past Surgical History:  Procedure Laterality Date   CATARACT EXTRACTION W/ INTRAOCULAR LENS  IMPLANT, BILATERAL     CORONARY ARTERY BYPASS GRAFT  1995   CYST EXCISION     on right shoulder   GRAFT APPLICATION Left 03/18/2017   Procedure: EpiCord GRAFT APPLICATION OF LEFT FOOT;  Surgeon: Asencion Islam, DPM;  Location: [facility];   Service: Podiatry;  Laterality: Left;   INCISION AND DRAINAGE OF WOUND Left 03/18/2017   Procedure: IRRIGATION AND DEBRIDEMENT WOUND OF LEFT FOOT;  Surgeon: Asencion Islam, DPM;  Location: [facility];  Service: Podiatry;  Laterality: Left;   REVERSE SHOULDER ARTHROPLASTY Left 11/16/2016   Procedure: LEFT REVERSE SHOULDER ARTHROPLASTY;  Surgeon: Beverely Low, MD;  Location: [facility] OR;  Service: Orthopedics;  Laterality: Left;   SESAMOIDECTOMY Left 03/18/2017   Procedure: SESAMOIDECTOMY OF LEFT FOOT;  Surgeon: Asencion Islam, DPM;  Location: [facility];  Service: Podiatry;  Laterality: Left;        Home Medications    Prior to Admission medications   Medication Sig Start Date End Date Taking? Authorizing Provider  ACCU-CHEK SOFTCLIX LANCETS lancets Used to check blood sugars daily. 02/05/17   Romero Belling, MD  acetaminophen (TYLENOL) 500 MG tablet Take 1,000-1,500 mg 2 (two) times daily as needed by mouth for moderate pain.    [provider]  Alcohol Swabs (B-D SINGLE USE SWABS REGULAR) PADS Used to check blood sugars daily. 02/05/17   Romero Belling, MD  Ascorbic Acid (VITAMIN C) 500 MG tablet Take 500 mg by mouth 2 (two) times daily.     [provider]  aspirin (GOODSENSE ASPIRIN) 325 MG tablet Take 325 mg daily by mouth.     [provider]  Blood Glucose Calibration (ACCU-CHEK AVIVA) SOLN 1 Bottle by In Vitro route as needed. 02/05/17   Romero Belling, MD  cholecalciferol (VITAMIN D) 1000 units tablet Take 1,000 Units at bedtime by mouth.    [provider]  Cyanocobalamin (B-12 PO) Take 1 tablet daily by mouth.    [provider]  docusate sodium (COLACE) 100 MG capsule Take 1 capsule (100 mg total) by mouth daily as needed. 03/18/17 03/18/18  Asencion Islam, DPM  ferrous sulfate 325 (65 FE) MG tablet Take 325 mg 2 (two) times daily by mouth.    [provider]  gabapentin (NEURONTIN) 300 MG capsule Take 2  capsules (600 mg total) by mouth at bedtime. 04/03/17   Stover, Titorya, DPM  glucose blood (ACCU-CHEK AVIVA) test strip Used to check blood sugars daily. 02/06/17   Romero Belling, MD  Inositol Niacinate (NIACIN FLUSH FREE) 500 MG CAPS Take 500 mg at bedtime by mouth.     [provider]  Lancet Devices (SIMPLE DIAGNOSTICS LANCING DEV) MISC  09/19/13   [provider]  metFORMIN (GLUCOPHAGE-XR) 500 MG 24 hr tablet Take 500 mg by mouth 2 (two) times daily. 10/04/16   [provider]  montelukast (SINGULAIR) 10 MG tablet TAKE 1 TABLET AT BEDTIME 04/11/17   Romero Belling, MD  Multiple Vitamins-Minerals (MULTIVITAMIN WITH MINERALS) tablet Take 1 tablet by mouth daily.      [provider]  promethazine (PHENERGAN) 12.5 MG tablet Take 1 tablet (12.5 mg total) by mouth every 6 (six) hours as needed for nausea or vomiting. 03/18/17   Asencion Islam, DPM  sertraline (ZOLOFT) 50 MG tablet  TAKE 1 TABLET EVERY DAY 04/11/17   Romero Belling, MD  simvastatin (ZOCOR) 80 MG tablet Take 1 tablet (80 mg total) by mouth at bedtime. 02/06/17   Romero Belling, MD  traMADol (ULTRAM) 50 MG tablet Take 1 tablet (50 mg total) by mouth every 12 (twelve) hours as needed. 03/18/17 03/18/18  Asencion Islam, DPM  Zinc 50 MG CAPS Take 50 mg at bedtime by mouth.     [provider]    Family History Family History  Problem Relation Age of Onset   Charcot-Marie-Tooth disease Sister    Charcot-Marie-Tooth disease Brother    Charcot-Marie-Tooth disease Brother    Charcot-Marie-Tooth disease Brother    Charcot-Marie-Tooth disease Sister    Charcot-Marie-Tooth disease Son    Charcot-Marie-Tooth disease Other    COPD Daughter        never smoker    Social History Social History   Tobacco Use   Smoking status: Former Smoker    Packs/day: 1.00    Years: 20.00    Pack years: 20.00    Types: Cigarettes    Last attempt to quit: 01/01/1966    Years since quitting: 52.2   Smokeless  tobacco: Never Used  Substance Use Topics   Alcohol use: No    Alcohol/week: 0.0 standard drinks   Drug use: No     Allergies   Other   Review of Systems Review of Systems  Constitutional: Negative for chills and fever.  HENT: Negative.  Negative for congestion.   Eyes: Negative for visual disturbance.  Respiratory: Negative for cough, chest tightness, shortness of breath and wheezing.   Cardiovascular: Positive for chest pain and leg swelling.  Gastrointestinal: Negative for abdominal pain, nausea and vomiting.  Genitourinary: Negative for decreased urine volume, dysuria and flank pain.  Musculoskeletal: Negative for arthralgias and myalgias.  Skin: Negative for color change and rash.  Neurological: Negative for dizziness, syncope and light-headedness.  All other systems reviewed and are negative.    Physical Exam Updated Vital Signs BP 113/86 (BP Location: Left Arm)    Pulse (!) 59    Temp 98.6 F (37 C) (Oral)    Resp 16    SpO2 96%   Physical Exam Vitals signs and nursing note reviewed.  Constitutional:      General: He is not in acute distress.    Appearance: He is well-developed and normal weight. He is not ill-appearing or diaphoretic.  HENT:     Head: Normocephalic and atraumatic.  Eyes:     General:        Right eye: No discharge.        Left eye: No discharge.     Pupils: Pupils are equal, round, and reactive to light.  Neck:     Musculoskeletal: Neck supple.     Vascular: No JVD.     Trachea: No tracheal deviation.  Cardiovascular:     Rate and Rhythm: Normal rate and regular rhythm.     Pulses:          Radial pulses are 2+ on the right side and 2+ on the left side.       Dorsalis pedis pulses are 2+ on the right side and 2+ on the left side.       Posterior tibial pulses are 2+ on the right side and 2+ on the left side.     Heart sounds: Normal heart sounds. No murmur. No gallop.   Pulmonary:     Effort: Pulmonary effort is  normal. No  respiratory distress.     Breath sounds: Normal breath sounds. No wheezing or rales.     Comments: Respirations equal and unlabored, patient able to speak in full sentences, lungs clear to auscultation bilaterally Chest:     Chest wall: Tenderness present.     Comments: Tenderness to palpation over the left side of the chest, pain reproducible.  No overlying erythema, rash or other skin changes.  No palpable deformity. Abdominal:     General: Bowel sounds are normal. There is no distension.     Palpations: Abdomen is soft. There is no mass.     Tenderness: There is no abdominal tenderness. There is no guarding.     Comments: Abdomen soft, nondistended, nontender to palpation in all quadrants without guarding or peritoneal signs  Musculoskeletal:        General: No deformity.     Right lower leg: Edema present.     Left lower leg: He exhibits no tenderness. No edema.     Comments: Right lower extremity with 2+ edema up to the mid shin with some erythema, left lower extremity without edema.  Bilateral lower extremities are warm and well perfused.  Skin:    General: Skin is warm and dry.     Capillary Refill: Capillary refill takes less than 2 seconds.  Neurological:     Mental Status: He is alert and oriented to person, place, and time.     Coordination: Coordination normal.     Comments: Speech is clear, able to follow commands CN III-XII intact Normal strength in upper and lower extremities bilaterally including dorsiflexion and plantar flexion, strong and equal grip strength Sensation normal to light and sharp touch Moves extremities without ataxia, coordination intact  Psychiatric:        Mood and Affect: Mood normal.        Behavior: Behavior normal.      ED Treatments / Results  Labs (all labs ordered are listed, but only abnormal results are displayed) Labs Reviewed  BASIC METABOLIC PANEL - Abnormal; Notable for the following components:      Result Value   BUN 30 (*)     All other components within normal limits  CBC - Abnormal; Notable for the following components:   RBC 3.90 (*)    Hemoglobin 11.5 (*)    HCT 37.2 (*)    All other components within normal limits  BRAIN NATRIURETIC PEPTIDE  I-STAT TROPONIN, ED    EKG EKG Interpretation  Date/Time:  Thursday March 13 2018 10:17:52 EDT Ventricular Rate:  59 PR Interval:  148 QRS Duration: 90 QT Interval:  416 QTC Calculation: 411 R Axis:   69 Text Interpretation:  Sinus bradycardia Otherwise normal ECG improved t wave inversions compared with prior 4/07 Confirmed by Meridee Score [phone]) on 03/13/2018 11:48:26 AM   Radiology No results found.  Procedures Procedures (including critical care time)  Medications Ordered in ED Medications  sodium chloride flush (NS) 0.9 % injection 3 mL (has no administration in time range)     Initial Impression / Assessment and Plan / ED Course  I have reviewed the triage vital signs and the nursing notes.  Pertinent labs & imaging results that were available during my care of the patient were reviewed by me and considered in my medical decision making (see chart for details).  85 year old male presents for evaluation of left-sided chest pain.  Patient has had this pain previously and has been evaluated by cardiology but  reports worsening over the past week especially over the past 2 days.  Intermittent shortness of breath.  Also noted some right lower extremity swelling.  Because of patient's muscular dystrophy he is unable to lay flat for any CT scans or further imaging and has not been able to have a chemical stress test because of this as well.  On arrival patient with normal vitals and overall well-appearing, currently denies any chest pain, last episode of chest pain earlier this morning and was fairly mild, more severe yesterday.  Pain is not worse with exertion or pleuritic in nature.  Patient has also noted some swelling in his right leg.  Patient reports  he gets this swelling intermittently although is currently worse than usual.  Chest pain is reproducible with palpation over the left side of the chest.  Lungs are clear on exam.  There is a small amount of swelling on the right when compared to lower lobe.  Will get chest pain labs and EKG and chest x-ray as well as a DVT study of the right lower extremity.  Patient is not tachypneic or hypoxic and given that his pain has been occurring intermittently for months I have lower suspicion for PE and patient would not tolerate CT scan as he is unable to lay flat.  EKG shows sinus rhythm without new or concerning changes.  Initial troponin negative.  Labs reassuring, stable anemia, no leukocytosis, no acute electrolyte derangements and normal renal function, x-ray reviewed by myself and shows no evidence of active cardiopulmonary disease.  Patient has had no further episodes of chest pain since being here in the emergency department.  Delta troponin is negative as well, low ACS risk.  DVT study shows no evidence of blood clot in the right lower extremity.  Will have patient use compression stockings and elevation, feel that the swelling is likely due to to venous insufficiency, patient has had prior surgery on the other leg since the swelling on the left side.  His BNP is not significantly elevated today.  At this time feel patient is stable for discharge home with close follow-up with his PCP and cardiologist.  Return precautions discussed.  Patient and family expressed understanding and are in agreement with plan.   Patient discussed with Dr. Charm Barges, who saw patient as well and agrees with plan.   Final Clinical Impressions(s) / ED Diagnoses   Final diagnoses:  Atypical chest pain    ED Discharge Orders    None       Dartha Lodge, [place] 03/19/18 1052    Terrilee Files, MD 03/19/18 [phone]

## 2018-03-13 NOTE — ED Triage Notes (Signed)
Patient reports he was sent by his doctor for further evaluation of worsening L sided CP - states he has a history of the same, but it began getting worse last week - intermittent - has not been taking NTG (because it causes headaches) and he takes a 325mg  ASA daily. Patient denies CP at this time. Also reports intermittent shortness of breath. Resp e/u, skin w/d.

## 2018-03-13 NOTE — Discharge Instructions (Addendum)
Your work-up today is reassuring.  I do not think that your chest pain today is caused by an acute problem with your heart, your chest x-ray is clear.  Your ultrasound showed no evidence of a blood clot in your leg.  Reassured that you have not had further episodes of chest pain while you have been here today.  Please follow-up with your primary care doctor and your cardiologist.  You can wear compression stockings over the right leg to help with swelling.

## 2018-03-13 NOTE — ED Notes (Signed)
ED Provider at bedside. 

## 2018-03-13 NOTE — ED Notes (Signed)
Main lab to add on BNP 

## 2018-04-06 DIAGNOSIS — J9610 Chronic respiratory failure, unspecified whether with hypoxia or hypercapnia: Secondary | ICD-10-CM | POA: Diagnosis not present

## 2018-04-06 DIAGNOSIS — G600 Hereditary motor and sensory neuropathy: Secondary | ICD-10-CM | POA: Diagnosis not present

## 2018-04-21 DIAGNOSIS — E559 Vitamin D deficiency, unspecified: Secondary | ICD-10-CM | POA: Diagnosis not present

## 2018-04-21 DIAGNOSIS — E782 Mixed hyperlipidemia: Secondary | ICD-10-CM | POA: Diagnosis not present

## 2018-04-21 DIAGNOSIS — N400 Enlarged prostate without lower urinary tract symptoms: Secondary | ICD-10-CM | POA: Diagnosis not present

## 2018-04-21 DIAGNOSIS — D513 Other dietary vitamin B12 deficiency anemia: Secondary | ICD-10-CM | POA: Diagnosis not present

## 2018-04-21 DIAGNOSIS — R079 Chest pain, unspecified: Secondary | ICD-10-CM | POA: Diagnosis not present

## 2018-04-21 DIAGNOSIS — E119 Type 2 diabetes mellitus without complications: Secondary | ICD-10-CM | POA: Diagnosis not present

## 2018-04-21 DIAGNOSIS — G7100 Muscular dystrophy, unspecified: Secondary | ICD-10-CM | POA: Diagnosis not present

## 2018-04-21 DIAGNOSIS — G8929 Other chronic pain: Secondary | ICD-10-CM | POA: Diagnosis not present

## 2018-04-21 DIAGNOSIS — R0600 Dyspnea, unspecified: Secondary | ICD-10-CM | POA: Diagnosis not present

## 2018-05-21 ENCOUNTER — Ambulatory Visit (INDEPENDENT_AMBULATORY_CARE_PROVIDER_SITE_OTHER): Payer: Medicare HMO | Admitting: Sports Medicine

## 2018-05-21 ENCOUNTER — Encounter: Payer: Self-pay | Admitting: Sports Medicine

## 2018-05-21 ENCOUNTER — Other Ambulatory Visit: Payer: Self-pay

## 2018-05-21 VITALS — BP 140/80 | HR 77 | Temp 98.0°F | Resp 16

## 2018-05-21 DIAGNOSIS — E1140 Type 2 diabetes mellitus with diabetic neuropathy, unspecified: Secondary | ICD-10-CM | POA: Diagnosis not present

## 2018-05-21 DIAGNOSIS — Z89412 Acquired absence of left great toe: Secondary | ICD-10-CM | POA: Diagnosis not present

## 2018-05-21 DIAGNOSIS — I739 Peripheral vascular disease, unspecified: Secondary | ICD-10-CM

## 2018-05-21 DIAGNOSIS — M79674 Pain in right toe(s): Secondary | ICD-10-CM

## 2018-05-21 DIAGNOSIS — M779 Enthesopathy, unspecified: Secondary | ICD-10-CM | POA: Diagnosis not present

## 2018-05-21 DIAGNOSIS — B351 Tinea unguium: Secondary | ICD-10-CM

## 2018-05-21 DIAGNOSIS — M79675 Pain in left toe(s): Secondary | ICD-10-CM

## 2018-05-21 NOTE — Progress Notes (Signed)
Subjective: [name] is a 85 y.o. male patient with history of diabetes who presents to office today complaining of long,mildly painful nails  while ambulating in shoes; unable to trim. Patient states that he is having swelling at right foot and leg and some occasional shooting pain on the left lateral foot. Patient reports that pain is worse with walking and position when sitting or lying with sharp pain that lasts a few seconds for the last 2 weeks. Denies injury or any other problems.  No other issues noted.  Patient Active Problem List   Diagnosis Date Noted  . Abnormal breath sounds 03/05/2017  . Chronic pain 02/05/2017  . Acute diastolic CHF (congestive heart failure) (HCC) 12/04/2016  . S/P shoulder replacement, left 11/16/2016  . Preoperative clearance 10/15/2016  . Hyperlipidemia 10/15/2016  . Diabetes (HCC) 03/10/2015  . Perennial and seasonal allergic rhinitis 12/20/2014  . Hematuria 12/20/2014  . Loss of weight 05/06/2014  . Other pancytopenia (HCC) 05/05/2014  . CAP (community acquired pneumonia) 05/05/2014  . Hypocalcemia 05/05/2014  . Skin lesion 04/30/2014  . 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 Abdominal pain, right upper quadrant 05/04/2013  . Cold intolerance 05/04/2013  . Low back pain 04/03/2012  . Encounter for long-term (current) use of other medications 12/11/2011  . Routine general medical examination at a health care facility 12/17/2010  . Screening for prostate cancer 12/12/2010  . Osteoarthrosis, unspecified whether generalized or localized, unspecified site 10/24/2010  . SKIN RASH 06/29/2009  . Iron deficiency anemia 03/10/2009  . BACK PAIN, LUMBAR 03/10/2009  . CHARCOT-MARIE-TOOTH DISEASE 05/03/2008  . DYSPNEA 05/03/2008  . DEPRESSIVE DISORDER NOT ELSEWHERE CLASSIFIED 05/22/2007  . CHEST PAIN 05/22/2007  . ANEMIA, PERNICIOUS 12/02/2006  . Essential hypertension 07/26/2006  . Coronary atherosclerosis 07/26/2006  . GERD 07/26/2006    Current Outpatient Medications on File Prior to Visit  Medication Sig Dispense Refill  . ACCU-CHEK SOFTCLIX LANCETS lancets Used to check blood sugars daily. 100 each 12  . acetaminophen (TYLENOL) 500 MG tablet Take 1,000-1,500 mg 2 (two) times daily as needed by mouth for moderate pain.    . Alcohol Swabs (B-D SINGLE USE SWABS REGULAR) PADS Used to check blood sugars daily. 100 each 11  . Ascorbic Acid (VITAMIN C) 500 MG tablet Take 500 mg by mouth 2 (two) times daily.     Marland Kitchen aspirin (GOODSENSE ASPIRIN) 325 MG tablet Take 325 mg daily by mouth.     . Blood Glucose Calibration (ACCU-CHEK AVIVA) SOLN 1 Bottle by In Vitro route as needed. 1 each 0  . cholecalciferol (VITAMIN D) 1000 units tablet Take 1,000 Units at bedtime by mouth.    . Cyanocobalamin (B-12 PO) Take 1 tablet daily by mouth.    . ferrous sulfate 325 (65 FE) MG tablet Take 325 mg 2 (two) times daily by mouth.    . gabapentin (NEURONTIN) 300 MG capsule Take 2 capsules (600 mg total) by mouth at bedtime. 180 capsule 3  . glucose blood (ACCU-CHEK AVIVA) test strip Used to check blood sugars daily. 100 each 12  . Inositol Niacinate (NIACIN FLUSH FREE) 500 MG CAPS Take 500 mg at bedtime by mouth.     Demetra Shiner Devices (SIMPLE DIAGNOSTICS LANCING DEV) MISC   1  . metFORMIN (GLUCOPHAGE-XR) 500 MG 24 hr tablet Take 500 mg by mouth 2 (two) times daily.    . montelukast (SINGULAIR) 10 MG tablet TAKE 1 TABLET AT BEDTIME 90 tablet 3  . Multiple Vitamins-Minerals (MULTIVITAMIN WITH MINERALS)  tablet Take 1 tablet by mouth daily.      . promethazine (PHENERGAN) 12.5 MG tablet Take 1 tablet (12.5 mg total) by mouth every 6 (six) hours as needed for nausea or vomiting. 30 tablet 0  . sertraline (ZOLOFT) 50 MG tablet TAKE 1 TABLET EVERY DAY 90 tablet 3  . simvastatin (ZOCOR) 80 MG tablet Take 1 tablet (80 mg total) by mouth at bedtime. 90 tablet 3  . Zinc 50 MG CAPS Take 50 mg at bedtime by mouth.      No current facility-administered  medications on file prior to visit.    Allergies  Allergen Reactions  . Other     Recent Results (from the past 2160 hour(s))  Basic metabolic panel     Status: Abnormal   Collection Time: 03/13/18 11:28 AM  Result Value Ref Range   Sodium 142 135 - 145 mmol/L   Potassium 4.5 3.5 - 5.1 mmol/L   Chloride 107 98 - 111 mmol/L   CO2 27 22 - 32 mmol/L   Glucose, Bld 91 70 - 99 mg/dL   BUN 30 (H) 8 - 23 mg/dL   Creatinine, Ser 2.03 0.61 - 1.24 mg/dL   Calcium 9.6 8.9 - 55.9 mg/dL   GFR calc non Af Amer >60 >60 mL/min   GFR calc Af Amer >60 >60 mL/min   Anion gap 8 5 - 15    Comment: Performed at [facility], [address], [place], [place] [address]  CBC     Status: Abnormal   Collection Time: 03/13/18 11:28 AM  Result Value Ref Range   WBC 5.5 4.0 - 10.5 K/uL   RBC 3.90 (L) 4.22 - 5.81 MIL/uL   Hemoglobin 11.5 (L) 13.0 - 17.0 g/dL   HCT 84.5 (L) 36.4 - 68.0 %   MCV 95.4 80.0 - 100.0 fL   MCH 29.5 26.0 - 34.0 pg   MCHC 30.9 30.0 - 36.0 g/dL   RDW 32.1 22.4 - 82.5 %   Platelets 165 150 - 400 K/uL   nRBC 0.0 0.0 - 0.2 %    Comment: Performed at [facility], [address], [place], [place] [address]  Brain natriuretic peptide     Status: Abnormal   Collection Time: 03/13/18 11:28 AM  Result Value Ref Range   B Natriuretic Peptide 107.2 (H) 0.0 - 100.0 pg/mL    Comment: Performed at [facility], [address], [place], [place] [address]  I-stat troponin, ED     Status: None   Collection Time: 03/13/18 11:34 AM  Result Value Ref Range   Troponin i, poc 0.01 0.00 - 0.08 ng/mL   Comment 3            Comment: Due to the release kinetics of cTnI, a negative result within the first hours of the onset of symptoms does not rule out myocardial infarction with certainty. If myocardial infarction is still suspected, repeat the test at appropriate intervals.   I-stat troponin, ED     Status: None   Collection Time: 03/13/18  2:51 PM  Result Value Ref Range    Troponin i, poc 0.01 0.00 - 0.08 ng/mL   Comment 3            Comment: Due to the release kinetics of cTnI, a negative result within the first hours of the onset of symptoms does not rule out myocardial infarction with certainty. If myocardial infarction is still suspected, repeat the test  at appropriate intervals.     Objective: General: Patient is awake, alert, and oriented x 3 and in no acute distress.  Integument: Skin is warm, dry and supple bilateral. Nails are tender, long, thickened and  dystrophic with subungual debris, consistent with onychomycosis, 1-5 on right and 2-5 on left.  At the lesser toes there are mild abrasions from previous fall without signs of infection. Remaining integument unremarkable. Amputation site well-healed.  Vasculature:  Dorsalis Pedis pulse 1/4 bilateral. Posterior Tibial pulse  0/4 bilateral.  1+ pitting edema bilateral and hyperpigmentation bilateral due to venous stasis R>L.   Neurology: The patient has absent sensation measured with a 5.07/10g Semmes Weinstein Monofilament at all pedal sites bilateral.  Musculoskeletal: Status post left hallux amputation due to history of osteomyelitis.  Bone spur at the lateral foot on the right. There is mild pain with palpation to peroneal tendon on left.  No tenderness with calf compression bilateral.  Assessment and Plan: Problem List Items Addressed This Visit    None    Visit Diagnoses    Pain due to onychomycosis of toenails of both feet    -  Primary   Type 2 diabetes, controlled, with neuropathy (HCC)       PVD (peripheral vascular disease) (HCC)       History of amputation of left great toe (HCC)         -Examined patient. -Discussed and educated patient on diabetic foot care, especially with  regards to the vascular, neurological and musculoskeletal systems.  -Encourage elevation of legs to assist with edema control and to discuss with PCP other alternatives for fluid/edema  management -Mechanically debrided all nails 1-5 on right and 2-5 on left using sterile nail nipper and filed with dremel without incident -Advised patient to use walker and assistive devices to help with stability in gait -Dispensed fascial brace to use on left and surgitube to use on right for edema control -Answered all patient questions -Patient to return  in 3 months for at risk foot care -Patient advised to call the office if any problems or questions arise in the meantime.  Asencion Islamitorya Brason Berthelot, DPM

## 2018-08-20 ENCOUNTER — Ambulatory Visit: Payer: Medicare HMO | Admitting: Sports Medicine

## 2018-08-28 ENCOUNTER — Other Ambulatory Visit: Payer: Self-pay

## 2018-08-28 ENCOUNTER — Ambulatory Visit (INDEPENDENT_AMBULATORY_CARE_PROVIDER_SITE_OTHER): Payer: Medicare HMO | Admitting: Sports Medicine

## 2018-08-28 ENCOUNTER — Encounter: Payer: Self-pay | Admitting: Sports Medicine

## 2018-08-28 VITALS — Temp 99.2°F | Resp 16

## 2018-08-28 DIAGNOSIS — B351 Tinea unguium: Secondary | ICD-10-CM | POA: Diagnosis not present

## 2018-08-28 DIAGNOSIS — I739 Peripheral vascular disease, unspecified: Secondary | ICD-10-CM

## 2018-08-28 DIAGNOSIS — Z89412 Acquired absence of left great toe: Secondary | ICD-10-CM

## 2018-08-28 DIAGNOSIS — M79675 Pain in left toe(s): Secondary | ICD-10-CM | POA: Diagnosis not present

## 2018-08-28 DIAGNOSIS — E1140 Type 2 diabetes mellitus with diabetic neuropathy, unspecified: Secondary | ICD-10-CM | POA: Diagnosis not present

## 2018-08-28 DIAGNOSIS — M79674 Pain in right toe(s): Secondary | ICD-10-CM | POA: Diagnosis not present

## 2018-08-28 NOTE — Progress Notes (Signed)
Subjective: [name] is a 85 y.o. male patient with history of diabetes who presents to office today complaining of long,mildly painful nails  while ambulating in shoes; unable to trim. Patient states that he is having swelling at right foot and leg and was recently diagnosed with a tumor in his kidney/bladder and will be undergoing surgery soon.  Patient is assisted by daughter who helps to report this history.  No other issues noted.  Patient Active Problem List   Diagnosis Date Noted  . Abnormal breath sounds 03/05/2017  . Chronic pain 02/05/2017  . Acute diastolic CHF (congestive heart failure) ([place]) 12/04/2016  . S/P shoulder replacement, left 11/16/2016  . Preoperative clearance 10/15/2016  . Hyperlipidemia 10/15/2016  . Diabetes ([place]) 03/10/2015  . Perennial and seasonal allergic rhinitis 12/20/2014  . Hematuria 12/20/2014  . Loss of weight 05/06/2014  . Other pancytopenia ([place]) 05/05/2014  . CAP (community acquired pneumonia) 05/05/2014  . Hypocalcemia 05/05/2014  . Skin lesion 04/30/2014  . 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 Abdominal pain, right upper quadrant 05/04/2013  . Cold intolerance 05/04/2013  . Low back pain 04/03/2012  . Encounter for long-term (current) use of other medications 12/11/2011  . Routine general medical examination at a health care facility 12/17/2010  . Screening for prostate cancer 12/12/2010  . Osteoarthrosis, unspecified whether generalized or localized, unspecified site 10/24/2010  . SKIN RASH 06/29/2009  . Iron deficiency anemia 03/10/2009  . BACK PAIN, LUMBAR 03/10/2009  . [place] DISEASE 05/03/2008  . DYSPNEA 05/03/2008  . DEPRESSIVE DISORDER NOT ELSEWHERE CLASSIFIED 05/22/2007  . CHEST PAIN 05/22/2007  . ANEMIA, PERNICIOUS 12/02/2006  . Essential hypertension 07/26/2006  . Coronary atherosclerosis 07/26/2006  . GERD 07/26/2006   Current Outpatient Medications on File Prior to Visit  Medication Sig  Dispense Refill  . ACCU-CHEK SOFTCLIX LANCETS lancets Used to check blood sugars daily. 100 each 12  . acetaminophen (TYLENOL) 500 MG tablet Take 1,000-1,500 mg 2 (two) times daily as needed by mouth for moderate pain.    . Alcohol Swabs (B-D SINGLE USE SWABS REGULAR) PADS Used to check blood sugars daily. 100 each 11  . Ascorbic Acid (VITAMIN C) 500 MG tablet Take 500 mg by mouth 2 (two) times daily.     Marland Kitchen aspirin (GOODSENSE ASPIRIN) 325 MG tablet Take 325 mg daily by mouth.     . Blood Glucose Calibration (ACCU-CHEK AVIVA) SOLN 1 Bottle by In Vitro route as needed. 1 each 0  . cholecalciferol (VITAMIN D) 1000 units tablet Take 1,000 Units at bedtime by mouth.    . Cyanocobalamin (B-12 PO) Take 1 tablet daily by mouth.    . ferrous sulfate 325 (65 FE) MG tablet Take 325 mg 2 (two) times daily by mouth.    . gabapentin (NEURONTIN) 300 MG capsule Take 2 capsules (600 mg total) by mouth at bedtime. 180 capsule 3  . glucose blood (ACCU-CHEK AVIVA) test strip Used to check blood sugars daily. 100 each 12  . Inositol Niacinate (NIACIN FLUSH FREE) 500 MG CAPS Take 500 mg at bedtime by mouth.     Elmore Guise Devices (SIMPLE DIAGNOSTICS LANCING DEV) MISC   1  . metFORMIN (GLUCOPHAGE) 500 MG tablet     . metFORMIN (GLUCOPHAGE-XR) 500 MG 24 hr tablet Take 500 mg by mouth 2 (two) times daily.    . montelukast (SINGULAIR) 10 MG tablet TAKE 1 TABLET AT BEDTIME 90 tablet 3  . Multiple Vitamins-Minerals (MULTIVITAMIN WITH MINERALS) tablet Take 1 tablet by  mouth daily.      . promethazine (PHENERGAN) 12.5 MG tablet Take 1 tablet (12.5 mg total) by mouth every 6 (six) hours as needed for nausea or vomiting. 30 tablet 0  . sertraline (ZOLOFT) 50 MG tablet TAKE 1 TABLET EVERY DAY 90 tablet 3  . simvastatin (ZOCOR) 40 MG tablet     . simvastatin (ZOCOR) 80 MG tablet Take 1 tablet (80 mg total) by mouth at bedtime. 90 tablet 3  . Zinc 50 MG CAPS Take 50 mg at bedtime by mouth.      No current facility-administered  medications on file prior to visit.    Allergies  Allergen Reactions  . Other     No results found for this or any previous visit (from the past 2160 hour(s)).  Objective: General: Patient is awake, alert, and oriented x 3 and in no acute distress.  Integument: Skin is warm, dry and supple bilateral. Nails are tender, long, thickened and  dystrophic with subungual debris, consistent with onychomycosis, 1-5 on right and 2-5 on left.  At the lesser toes there are mild abrasions from previous fall without signs of infection that have slowly dried and crusted. Remaining integument unremarkable. Amputation site well-healed.  Vasculature:  Dorsalis Pedis pulse 1/4 bilateral. Posterior Tibial pulse  0/4 bilateral.  1+ pitting edema bilateral and hyperpigmentation bilateral due to venous stasis R>L.   Neurology: The patient has absent sensation measured with a 5.07/10g Semmes Weinstein Monofilament at all pedal sites bilateral.  Musculoskeletal: Status post left hallux amputation due to history of osteomyelitis.  Bone spur at the lateral foot on the right. There is mild pain with palpation to peroneal tendon on left.  No tenderness with calf compression bilateral.  Assessment and Plan: Problem List Items Addressed This Visit    None    Visit Diagnoses    Pain due to onychomycosis of toenails of both feet    -  Primary   Type 2 diabetes, controlled, with neuropathy (HCC)       PVD (peripheral vascular disease) (HCC)       History of amputation of left great toe (HCC)         -Examined patient. -Discussed and educated patient on diabetic foot care, especially with regards to the vascular, neurological and musculoskeletal systems.  -Encourage elevation of legs to assist with edema control and to discuss with PCP other alternatives for fluid/edema management like before.  Patient is suffering with kidney tumor and I advised patient that likely his swelling could be the result of this and  discussed with his kidney doctor. -Mechanically debrided all nails 1-5 on right and 2-5 on left using sterile nail nipper and filed with dremel without incident -Advised daughter to closely monitor abrasions on right foot if starts to open or drain applied antibiotic cream and return to office for further evaluation -Answered all patient questions -Patient to return  in 3 months for at risk foot care -Patient advised to call the office if any problems or questions arise in the meantime.  Asencion Islamitorya Keyla Milone, DPM

## 2018-09-01 DIAGNOSIS — Z01818 Encounter for other preprocedural examination: Secondary | ICD-10-CM | POA: Diagnosis not present

## 2018-09-17 ENCOUNTER — Ambulatory Visit: Payer: Medicare HMO | Admitting: Cardiovascular Disease

## 2018-11-19 ENCOUNTER — Ambulatory Visit: Payer: Medicare HMO | Admitting: Sports Medicine

## 2019-01-04 NOTE — Progress Notes (Signed)
Chief Complaint  Patient presents with  . Follow-up    CAD   History of Present Illness: 86 yo male with history of chronic diastolic CHF, CAD with 5V CABG in 1995 with most recent cath 2006, DM, HTN, former smoker, Charcot Marie tooth syndrome who is here today for cardiac follow up. I met him in April 2012 and he had c/o chest pains. Stress myoview April 2012 with no ischemia. I saw him 12/24/12 and there was question of atrial fibrillation in primary care. I arranged a 48 hour monitor which showed sinus rhythm with frequent PACs, several runs of SVT. Echo 01/12/13 with normal LV size and function, mild AI and TR. Probable mild pulm HTN. He could not lay flat for stress myoview images due to diaphragm issues, chronic problem. Seen by Dr. Melvyn Novas and note states he has no lung issues. He was treated for GERD. He underwent a shoulder surgery in November 2018 and had volume overload following the procedure. He was diuresed with Lasix. Echo December 2018 with normal LV size and normal LV systolic function, TWKM=62-86%. There was mild aortic valve disease with thickened leaflets and mild AI. Following diuresis with daily Lasix, he had dizziness so Lasix cut back to as needed dosing. He now has bladder cancer.   He is here today for follow up. The patient denies any palpitations, lower extremity edema, orthopnea, PND, dizziness, near syncope or syncope. Rare chest pains. Breathing worse per pt due to his neurological disorder.   Primary Care Physician: Welford Roche, NP  Past Medical History:  Diagnosis Date  . ANEMIA, IRON DEFICIENCY   . Arthritis   . BACK PAIN, LUMBAR   . Charcot-Marie-Tooth disease    assoiciated muscular dystrophy   . COPD (chronic obstructive pulmonary disease) ([place])   . Coronary artery disease   . DIABETES MELLITUS, TYPE II   . GERD (gastroesophageal reflux disease)   . H/O: asbestos exposure   . Hypertension   . Leukopenia   . Myocardial infarction ([place])   . Pernicious  anemia   . Pneumonia   . Sleep apnea    uses CPAP nightly  . Smoker    quit 1968--25 ppy    Past Surgical History:  Procedure Laterality Date  . CATARACT EXTRACTION W/ INTRAOCULAR LENS  IMPLANT, BILATERAL    . CORONARY ARTERY BYPASS GRAFT  1995  . CYST EXCISION     on right shoulder  . GRAFT APPLICATION Left 3/81/7711   Procedure: EpiCord GRAFT APPLICATION OF LEFT FOOT;  Surgeon: Landis Martins, DPM;  Location: [facility];  Service: Podiatry;  Laterality: Left;  . INCISION AND DRAINAGE OF WOUND Left 03/18/2017   Procedure: IRRIGATION AND DEBRIDEMENT WOUND OF LEFT FOOT;  Surgeon: Landis Martins, DPM;  Location: [facility];  Service: Podiatry;  Laterality: Left;  . REVERSE SHOULDER ARTHROPLASTY Left 11/16/2016   Procedure: LEFT REVERSE SHOULDER ARTHROPLASTY;  Surgeon: Netta Cedars, MD;  Location: [facility];  Service: Orthopedics;  Laterality: Left;  . SESAMOIDECTOMY Left 03/18/2017   Procedure: SESAMOIDECTOMY OF LEFT FOOT;  Surgeon: Landis Martins, DPM;  Location: [facility];  Service: Podiatry;  Laterality: Left;    Current Outpatient Medications  Medication Sig Dispense Refill  . ACCU-CHEK SOFTCLIX LANCETS lancets Used to check blood sugars daily. 100 each 12  . acetaminophen (TYLENOL) 500 MG tablet Take 1,000-1,500 mg 2 (two) times daily as needed by mouth for moderate pain.    . Alcohol Swabs (B-D SINGLE USE SWABS  REGULAR) PADS Used to check blood sugars daily. 100 each 11  . Ascorbic Acid (VITAMIN C) 500 MG tablet Take 500 mg by mouth 2 (two) times daily.     Marland Kitchen aspirin (GOODSENSE ASPIRIN) 325 MG tablet Take 325 mg daily by mouth.     . Blood Glucose Calibration (ACCU-CHEK AVIVA) SOLN 1 Bottle by In Vitro route as needed. 1 each 0  . cholecalciferol (VITAMIN D) 1000 units tablet Take 1,000 Units at bedtime by mouth.    . Cyanocobalamin (B-12 PO) Take 1 tablet daily by mouth.    . ferrous sulfate 325 (65 FE) MG tablet Take 325 mg 2 (two)  times daily by mouth.    . gabapentin (NEURONTIN) 300 MG capsule Take 2 capsules (600 mg total) by mouth at bedtime. 180 capsule 3  . glucose blood (ACCU-CHEK AVIVA) test strip Used to check blood sugars daily. 100 each 12  . Inositol Niacinate (NIACIN FLUSH FREE) 500 MG CAPS Take 500 mg at bedtime by mouth.     Elmore Guise Devices (SIMPLE DIAGNOSTICS LANCING DEV) MISC   1  . metFORMIN (GLUCOPHAGE) 500 MG tablet     . metFORMIN (GLUCOPHAGE-XR) 500 MG 24 hr tablet Take 500 mg by mouth 2 (two) times daily.    . montelukast (SINGULAIR) 10 MG tablet TAKE 1 TABLET AT BEDTIME 90 tablet 3  . Multiple Vitamins-Minerals (MULTIVITAMIN WITH MINERALS) tablet Take 1 tablet by mouth daily.      . promethazine (PHENERGAN) 12.5 MG tablet Take 1 tablet (12.5 mg total) by mouth every 6 (six) hours as needed for nausea or vomiting. 30 tablet 0  . sertraline (ZOLOFT) 50 MG tablet TAKE 1 TABLET EVERY DAY 90 tablet 3  . simvastatin (ZOCOR) 80 MG tablet Take 1 tablet (80 mg total) by mouth at bedtime. 90 tablet 3  . tamsulosin (FLOMAX) 0.4 MG CAPS capsule Take 0.4 mg by mouth as needed.    . Zinc 50 MG CAPS Take 50 mg at bedtime by mouth.      No current facility-administered medications for this visit.    Allergies  Allergen Reactions  . Other     Social History   Socioeconomic History  . Marital status: Widowed    Spouse name: Not on file  . Number of children: Not on file  . Years of education: Not on file  . Highest education level: Not on file  Occupational History  . Occupation: Retired    Fish farm manager: RETIRED    Comment: Engineer, building services  Tobacco Use  . Smoking status: Former Smoker    Packs/day: 1.00    Years: 20.00    Pack years: 20.00    Types: Cigarettes    Quit date: 01/01/1966    Years since quitting: 53.0  . Smokeless tobacco: Never Used  Substance and Sexual Activity  . Alcohol use: No    Alcohol/week: 0.0 standard drinks  . Drug use: No  . Sexual activity: Not on file  Other Topics  Concern  . Not on file  Social History Narrative   Was in service during [place] War.  Lives alone in a one story home.  On disability.        Social Determinants of Health   Financial Resource Strain:   . Difficulty of Paying Living Expenses: Not on file  Food Insecurity:   . Worried About Charity fundraiser in the Last Year: Not on file  . Ran Out of Food in the Last Year: Not on  file  Transportation Needs:   . Film/video editor (Medical): Not on file  . Lack of Transportation (Non-Medical): Not on file  Physical Activity:   . Days of Exercise per Week: Not on file  . Minutes of Exercise per Session: Not on file  Stress:   . Feeling of Stress : Not on file  Social Connections:   . Frequency of Communication with Friends and Family: Not on file  . Frequency of Social Gatherings with Friends and Family: Not on file  . Attends Religious Services: Not on file  . Active Member of Clubs or Organizations: Not on file  . Attends Archivist Meetings: Not on file  . Marital Status: Not on file  Intimate Partner Violence:   . Fear of Current or Ex-Partner: Not on file  . Emotionally Abused: Not on file  . Physically Abused: Not on file  . Sexually Abused: Not on file    Family History  Problem Relation Age of Onset  . Charcot-Marie-Tooth disease Sister   . Charcot-Marie-Tooth disease Brother   . Charcot-Marie-Tooth disease Brother   . Charcot-Marie-Tooth disease Brother   . Charcot-Marie-Tooth disease Sister   . Charcot-Marie-Tooth disease Son   . Charcot-Marie-Tooth disease Other   . COPD Daughter        never smoker    Review of Systems:  As stated in the HPI and otherwise negative.   BP 132/60   Pulse 65   Ht _0  (1.778 m)   Wt 195 lb 12.8 oz (88.8 kg)   SpO2 97%   BMI 28.09 kg/m   Physical Examination:  General: Well developed, well nourished, NAD  HEENT: OP clear, mucus membranes moist  SKIN: warm, dry. No rashes. Neuro: No focal deficits    Musculoskeletal: Muscle strength 5/5 all ext  Psychiatric: Mood and affect normal  Neck: No JVD, no carotid bruits, no thyromegaly, no lymphadenopathy.  Lungs:Clear bilaterally, no wheezes, rhonci, crackles Cardiovascular: Regular rate and rhythm. No murmurs, gallops or rubs. Abdomen:Soft. Bowel sounds present. Non-tender.  Extremities: No lower extremity edema. Pulses are 2 + in the bilateral DP/PT.  Echo 12/17/16: - Left ventricle: The cavity size was normal. Wall thickness was   normal. Systolic function was normal. The estimated ejection   fraction was in the range of 60% to 65%. Wall motion was normal;   there were no regional wall motion abnormalities. Left   ventricular diastolic function parameters were normal for the   patient&'s age. - Aortic valve: Moderately calcified annulus. Mildly thickened,   mildly calcified leaflets. There was mild regurgitation. Valve   area (VTI): 2.04 cm^2. Valve area (Vmax): 2.02 cm^2. Valve area   (Vmean): 1.85 cm^2. - Right atrium: The atrium was mildly dilated.  EKG:  EKG is not ordered today. The ekg ordered today demonstrates   Recent Labs: 03/13/2018: B Natriuretic Peptide 107.2; BUN 30; Creatinine, Ser 0.83; Hemoglobin 11.5; Platelets 165; Potassium 4.5; Sodium 142   Lipid Panel    Component Value Date/Time   CHOL 150 03/05/2017 1344   TRIG 109.0 03/05/2017 1344   HDL 54.20 03/05/2017 1344   CHOLHDL 3 03/05/2017 1344   VLDL 21.8 03/05/2017 1344   LDLCALC 74 03/05/2017 1344     Wt Readings from Last 3 Encounters:  01/05/19 195 lb 12.8 oz (88.8 kg)  02/13/18 191 lb (86.6 kg)  03/18/17 188 lb (85.3 kg)     Other studies Reviewed: Additional studies/ records that were reviewed today include: . Review of  the above records demonstrates:    Assessment and Plan:    1. CAD s/p CABG without angina: He had CABB in 1995. No chest pain. No beta blocker with bradycardia.  Continue ASA and statin. He has been unable to undergo stress  testing as he cannot lay flat on the table due to back issues and trouble breathing due to his diaphragm. He would be a poor candidate for cardiac cath given his multiple chronic issues.   2. History of SVT: He does not have palpitations.   3. Chronic diastolic CHF: Weight stable. No volume overload on exam. Continue lasix as needed.   4. HTN: BP is well controlled. No changes.   Current medicines are reviewed at length with the patient today.  The patient does not have concerns regarding medicines.  The following changes have been made:  no change  Labs/ tests ordered today include:   No orders of the defined types were placed in this encounter.   Disposition:   FU with me in 12  months  Signed, Lauree Chandler, MD 01/05/2019 12:26 PM    [place] [address], [place], [place]  [address] Phone: [phone]; Fax: [phone]

## 2019-01-05 ENCOUNTER — Ambulatory Visit: Payer: Medicare HMO | Admitting: Cardiovascular Disease

## 2019-01-05 ENCOUNTER — Other Ambulatory Visit: Payer: Self-pay

## 2019-01-05 ENCOUNTER — Encounter: Payer: Self-pay | Admitting: Cardiovascular Disease

## 2019-01-05 VITALS — BP 132/60 | HR 65 | Ht 70.0 in | Wt 195.8 lb

## 2019-01-05 DIAGNOSIS — I10 Essential (primary) hypertension: Secondary | ICD-10-CM | POA: Diagnosis not present

## 2019-01-05 DIAGNOSIS — I2510 Atherosclerotic heart disease of native coronary artery without angina pectoris: Secondary | ICD-10-CM | POA: Diagnosis not present

## 2019-01-05 DIAGNOSIS — I5032 Chronic diastolic (congestive) heart failure: Secondary | ICD-10-CM

## 2019-01-05 DIAGNOSIS — I471 Supraventricular tachycardia: Secondary | ICD-10-CM

## 2019-01-05 NOTE — Patient Instructions (Signed)
Medication Instructions:  No changes *If you need a refill on your cardiac medications before your next appointment, please call your pharmacy*  Lab Work: none If you have labs (blood work) drawn today and your tests are completely normal, you will receive your results only by: . MyChart Message (if you have MyChart) OR . A paper copy in the mail If you have any lab test that is abnormal or we need to change your treatment, we will call you to review the results.  Testing/Procedures: none  Follow-Up: At [facility], you and your health needs are our priority.  As part of our continuing mission to provide you with exceptional heart care, we have created designated Provider Care Teams.  These Care Teams include your primary Cardiologist (physician) and Advanced Practice Providers (APPs -  Physician Assistants and Nurse Practitioners) who all work together to provide you with the care you need, when you need it.  Your next appointment:   12 month(s)  The format for your next appointment:   Either In Person or Virtual  Provider:   Christopher McAlhany, MD  Other Instructions   

## 2019-01-08 ENCOUNTER — Ambulatory Visit: Payer: Medicare HMO | Admitting: Sports Medicine

## 2019-01-09 ENCOUNTER — Ambulatory Visit (INDEPENDENT_AMBULATORY_CARE_PROVIDER_SITE_OTHER): Payer: Medicare HMO | Admitting: Sports Medicine

## 2019-01-09 ENCOUNTER — Other Ambulatory Visit: Payer: Self-pay

## 2019-01-09 ENCOUNTER — Encounter: Payer: Self-pay | Admitting: Sports Medicine

## 2019-01-09 DIAGNOSIS — E1140 Type 2 diabetes mellitus with diabetic neuropathy, unspecified: Secondary | ICD-10-CM

## 2019-01-09 DIAGNOSIS — M79675 Pain in left toe(s): Secondary | ICD-10-CM

## 2019-01-09 DIAGNOSIS — B351 Tinea unguium: Secondary | ICD-10-CM

## 2019-01-09 DIAGNOSIS — I739 Peripheral vascular disease, unspecified: Secondary | ICD-10-CM | POA: Diagnosis not present

## 2019-01-09 DIAGNOSIS — M79674 Pain in right toe(s): Secondary | ICD-10-CM

## 2019-01-09 NOTE — Progress Notes (Signed)
Subjective: [name] is a 86 y.o. male patient with history of diabetes who returns to office today complaining of long,mildly painful nails  while ambulating in shoes; unable to trim. Patient states that he is having swelling and was just recently seen by the cardiologist who noted the swelling as well.  Patient is assisted by daughter who helps to report this history.  FBS not checked, A1c unknown, PCP Theresa Mulligan, NP.  No other issues noted.  Patient Active Problem List   Diagnosis Date Noted  . Abnormal breath sounds 03/05/2017  . Chronic pain 02/05/2017  . Acute diastolic CHF (congestive heart failure) (HCC) 12/04/2016  . S/P shoulder replacement, left 11/16/2016  . Preoperative clearance 10/15/2016  . Hyperlipidemia 10/15/2016  . Diabetes (HCC) 03/10/2015  . Perennial and seasonal allergic rhinitis 12/20/2014  . Hematuria 12/20/2014  . Loss of weight 05/06/2014  . Other pancytopenia (HCC) 05/05/2014  . CAP (community acquired pneumonia) 05/05/2014  . Hypocalcemia 05/05/2014  . Skin lesion 04/30/2014  . 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 Abdominal pain, right upper quadrant 05/04/2013  . Cold intolerance 05/04/2013  . Low back pain 04/03/2012  . Encounter for long-term (current) use of other medications 12/11/2011  . Routine general medical examination at a health care facility 12/17/2010  . Screening for prostate cancer 12/12/2010  . Osteoarthrosis, unspecified whether generalized or localized, unspecified site 10/24/2010  . SKIN RASH 06/29/2009  . Iron deficiency anemia 03/10/2009  . BACK PAIN, LUMBAR 03/10/2009  . CHARCOT-MARIE-TOOTH DISEASE 05/03/2008  . DYSPNEA 05/03/2008  . DEPRESSIVE DISORDER NOT ELSEWHERE CLASSIFIED 05/22/2007  . CHEST PAIN 05/22/2007  . ANEMIA, PERNICIOUS 12/02/2006  . Essential hypertension 07/26/2006  . Coronary atherosclerosis 07/26/2006  . GERD 07/26/2006   Current Outpatient Medications on File Prior to Visit  Medication Sig  Dispense Refill  . ACCU-CHEK SOFTCLIX LANCETS lancets Used to check blood sugars daily. 100 each 12  . acetaminophen (TYLENOL) 500 MG tablet Take 1,000-1,500 mg 2 (two) times daily as needed by mouth for moderate pain.    . Alcohol Swabs (B-D SINGLE USE SWABS REGULAR) PADS Used to check blood sugars daily. 100 each 11  . Ascorbic Acid (VITAMIN C) 500 MG tablet Take 500 mg by mouth 2 (two) times daily.     Marland Kitchen aspirin (GOODSENSE ASPIRIN) 325 MG tablet Take 325 mg daily by mouth.     . Blood Glucose Calibration (ACCU-CHEK AVIVA) SOLN 1 Bottle by In Vitro route as needed. 1 each 0  . cholecalciferol (VITAMIN D) 1000 units tablet Take 1,000 Units at bedtime by mouth.    . Cyanocobalamin (B-12 PO) Take 1 tablet daily by mouth.    . ferrous sulfate 325 (65 FE) MG tablet Take 325 mg 2 (two) times daily by mouth.    . gabapentin (NEURONTIN) 300 MG capsule Take 2 capsules (600 mg total) by mouth at bedtime. 180 capsule 3  . glucose blood (ACCU-CHEK AVIVA) test strip Used to check blood sugars daily. 100 each 12  . Inositol Niacinate (NIACIN FLUSH FREE) 500 MG CAPS Take 500 mg at bedtime by mouth.     Demetra Shiner Devices (SIMPLE DIAGNOSTICS LANCING DEV) MISC   1  . metFORMIN (GLUCOPHAGE) 500 MG tablet     . metFORMIN (GLUCOPHAGE-XR) 500 MG 24 hr tablet Take 500 mg by mouth 2 (two) times daily.    . montelukast (SINGULAIR) 10 MG tablet TAKE 1 TABLET AT BEDTIME 90 tablet 3  . Multiple Vitamins-Minerals (MULTIVITAMIN WITH MINERALS) tablet Take 1  tablet by mouth daily.      . promethazine (PHENERGAN) 12.5 MG tablet Take 1 tablet (12.5 mg total) by mouth every 6 (six) hours as needed for nausea or vomiting. 30 tablet 0  . sertraline (ZOLOFT) 50 MG tablet TAKE 1 TABLET EVERY DAY 90 tablet 3  . simvastatin (ZOCOR) 80 MG tablet Take 1 tablet (80 mg total) by mouth at bedtime. 90 tablet 3  . tamsulosin (FLOMAX) 0.4 MG CAPS capsule Take 0.4 mg by mouth as needed.    . Zinc 50 MG CAPS Take 50 mg at bedtime by mouth.       No current facility-administered medications on file prior to visit.   Allergies  Allergen Reactions  . Other     No results found for this or any previous visit (from the past 2160 hour(s)).  Objective: General: Patient is awake, alert, and oriented x 3 and in no acute distress.  Integument: Skin is warm, dry and supple bilateral. Nails are tender, long, thickened and  dystrophic with subungual debris, consistent with onychomycosis, 1-5 on right and 2-5 on left.  At the lesser toes there are mild abrasions that are dried and crusted with no acute signs of infection. Remaining integument unremarkable. Amputation site well-healed.  Vasculature:  Dorsalis Pedis pulse 1/4 bilateral. Posterior Tibial pulse  0/4 bilateral.  1+ pitting edema bilateral and hyperpigmentation bilateral due to venous stasis R>L.   Neurology: The patient has absent sensation measured with a 5.07/10g Semmes Weinstein Monofilament at all pedal sites bilateral.  Musculoskeletal: Status post left hallux amputation due to history of osteomyelitis.  Bone spur at the lateral foot on the right. There is mild pain with palpation to peroneal tendon on left.  No tenderness with calf compression bilateral.  Assessment and Plan: Problem List Items Addressed This Visit    None    Visit Diagnoses    Pain due to onychomycosis of toenails of both feet    -  Primary   Type 2 diabetes, controlled, with neuropathy (HCC)       PVD (peripheral vascular disease) ([place])         -Examined patient. -Re-Discussed and educated patient on diabetic foot care, especially with regards to the vascular, neurological and musculoskeletal systems.  -Mechanically debrided all nails 1-5 on right and 2-5 on left using sterile nail nipper and filed with dremel without incident -Advised patient to follow-up with cardiologist regarding swelling and encourage elevation of legs to assist with edema control -Patient to return  in 3 months for at  risk foot care -Patient advised to call the office if any problems or questions arise in the meantime.  Landis Martins, DPM

## 2019-01-30 ENCOUNTER — Encounter: Payer: Self-pay | Admitting: Sports Medicine

## 2019-01-30 ENCOUNTER — Ambulatory Visit: Payer: Medicare HMO | Admitting: Sports Medicine

## 2019-01-30 ENCOUNTER — Other Ambulatory Visit: Payer: Self-pay | Admitting: Sports Medicine

## 2019-01-30 ENCOUNTER — Other Ambulatory Visit: Payer: Self-pay

## 2019-01-30 DIAGNOSIS — S90811A Abrasion, right foot, initial encounter: Secondary | ICD-10-CM | POA: Diagnosis not present

## 2019-01-30 DIAGNOSIS — M79671 Pain in right foot: Secondary | ICD-10-CM

## 2019-01-30 DIAGNOSIS — M7989 Other specified soft tissue disorders: Secondary | ICD-10-CM

## 2019-01-30 DIAGNOSIS — E1140 Type 2 diabetes mellitus with diabetic neuropathy, unspecified: Secondary | ICD-10-CM

## 2019-01-30 DIAGNOSIS — L02619 Cutaneous abscess of unspecified foot: Secondary | ICD-10-CM

## 2019-01-30 DIAGNOSIS — L03119 Cellulitis of unspecified part of limb: Secondary | ICD-10-CM | POA: Diagnosis not present

## 2019-01-30 MED ORDER — SULFAMETHOXAZOLE-TRIMETHOPRIM 400-80 MG PO TABS: 1.0000 | ORAL_TABLET | Freq: Two times a day (BID) | ORAL | 0 refills | Status: AC

## 2019-01-30 NOTE — Progress Notes (Signed)
Subjective: [name] is a 86 y.o. male patient who presents to office for evaluation of Right foot pain. Patient complains of progressive pain especially over the last few days, Admits to warmth, redness, and swelling to the area that is unrelieved and bleeding from the toes on the right and usually dresser to prevent from falling and hit his foot possibly . Patient has tried rest and elevation with no relief in symptoms.Patient denies any other pedal complaints.   FBS 105 a few days ago   Patient Active Problem List   Diagnosis Date Noted  . Abnormal breath sounds 03/05/2017  . Chronic pain 02/05/2017  . Acute diastolic CHF (congestive heart failure) ([place]) 12/04/2016  . S/P shoulder replacement, left 11/16/2016  . Preoperative clearance 10/15/2016  . Hyperlipidemia 10/15/2016  . Diabetes ([place]) 03/10/2015  . Perennial and seasonal allergic rhinitis 12/20/2014  . Hematuria 12/20/2014  . Loss of weight 05/06/2014  . Other pancytopenia ([place]) 05/05/2014  . CAP (community acquired pneumonia) 05/05/2014  . Hypocalcemia 05/05/2014  . Skin lesion 04/30/2014  . 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 Abdominal pain, right upper quadrant 05/04/2013  . Cold intolerance 05/04/2013  . Low back pain 04/03/2012  . Encounter for long-term (current) use of other medications 12/11/2011  . Routine general medical examination at a health care facility 12/17/2010  . Screening for prostate cancer 12/12/2010  . Osteoarthrosis, unspecified whether generalized or localized, unspecified site 10/24/2010  . SKIN RASH 06/29/2009  . Iron deficiency anemia 03/10/2009  . BACK PAIN, LUMBAR 03/10/2009  . [place] DISEASE 05/03/2008  . DYSPNEA 05/03/2008  . DEPRESSIVE DISORDER NOT ELSEWHERE CLASSIFIED 05/22/2007  . CHEST PAIN 05/22/2007  . ANEMIA, PERNICIOUS 12/02/2006  . Essential hypertension 07/26/2006  . Coronary atherosclerosis 07/26/2006  . GERD 07/26/2006    Current Outpatient  Medications on File Prior to Visit  Medication Sig Dispense Refill  . ACCU-CHEK SOFTCLIX LANCETS lancets Used to check blood sugars daily. 100 each 12  . acetaminophen (TYLENOL) 500 MG tablet Take 1,000-1,500 mg 2 (two) times daily as needed by mouth for moderate pain.    . Alcohol Swabs (B-D SINGLE USE SWABS REGULAR) PADS Used to check blood sugars daily. 100 each 11  . Ascorbic Acid (VITAMIN C) 500 MG tablet Take 500 mg by mouth 2 (two) times daily.     Marland Kitchen aspirin (GOODSENSE ASPIRIN) 325 MG tablet Take 325 mg daily by mouth.     . Blood Glucose Calibration (ACCU-CHEK AVIVA) SOLN 1 Bottle by In Vitro route as needed. 1 each 0  . cholecalciferol (VITAMIN D) 1000 units tablet Take 1,000 Units at bedtime by mouth.    . Cyanocobalamin (B-12 PO) Take 1 tablet daily by mouth.    . ferrous sulfate 325 (65 FE) MG tablet Take 325 mg 2 (two) times daily by mouth.    . gabapentin (NEURONTIN) 300 MG capsule Take 2 capsules (600 mg total) by mouth at bedtime. 180 capsule 3  . glucose blood (ACCU-CHEK AVIVA) test strip Used to check blood sugars daily. 100 each 12  . Inositol Niacinate (NIACIN FLUSH FREE) 500 MG CAPS Take 500 mg at bedtime by mouth.     Elmore Guise Devices (SIMPLE DIAGNOSTICS LANCING DEV) MISC   1  . metFORMIN (GLUCOPHAGE) 500 MG tablet     . metFORMIN (GLUCOPHAGE-XR) 500 MG 24 hr tablet Take 500 mg by mouth 2 (two) times daily.    . montelukast (SINGULAIR) 10 MG tablet TAKE 1 TABLET AT BEDTIME 90 tablet  3  . Multiple Vitamins-Minerals (MULTIVITAMIN WITH MINERALS) tablet Take 1 tablet by mouth daily.      . promethazine (PHENERGAN) 12.5 MG tablet Take 1 tablet (12.5 mg total) by mouth every 6 (six) hours as needed for nausea or vomiting. 30 tablet 0  . sertraline (ZOLOFT) 50 MG tablet TAKE 1 TABLET EVERY DAY 90 tablet 3  . simvastatin (ZOCOR) 80 MG tablet Take 1 tablet (80 mg total) by mouth at bedtime. 90 tablet 3  . tamsulosin (FLOMAX) 0.4 MG CAPS capsule Take 0.4 mg by mouth as needed.    .  Zinc 50 MG CAPS Take 50 mg at bedtime by mouth.      No current facility-administered medications on file prior to visit.    Allergies  Allergen Reactions  . Other     Objective:  General: Alert and oriented x3 in no acute distress  Dermatology: Focal Swelling, warmth, redness present on the right foot. Abrasion to right 2nd toe with dry blood in between all toes. Focal redness, warmth, and dry bloody scabs. Nails are short and thick consistent with  onychomycosis, 1-5 on right and 2-5 on left. Remaining integument unremarkable. Amputation site well-healed.  Vasculature:  Dorsalis Pedis pulse 1/4 bilateral. Posterior Tibial pulse  0/4 bilateral.  1+ pitting edema bilateral and hyperpigmentation bilateral due to venous stasis R>L.   Neurology: The patient has absent sensation measured with a 5.07/10g Semmes Weinstein Monofilament at all pedal sites bilateral.  Musculoskeletal: Status post left hallux amputation due to history of osteomyelitis.  Bone spur at the lateral foot on the right. There is mild pain with palpation to peroneal tendon on left.  No tenderness with calf compression bilateral.  Assessment and Plan: Problem List Items Addressed This Visit    None    Visit Diagnoses    Abrasion of right foot, initial encounter    -  Primary   Cellulitis and abscess of foot, except toes       Foot swelling       Right foot pain       Type 2 diabetes, controlled, with neuropathy (HCC)         -Examined patient  -Discussed treatement options for cellulitis with abrasion to right toes -Rx Bactrim  -Advised and put betadine in between all the toes with dry guaze and ace wrap to the knee on the right and advised patient and daughter to do daily for at least 1 week until abrasions have healed -Patient to return in 2 weeks for follow up exam or sooner if condition worsens.  Asencion Islam, DPM

## 2019-02-02 ENCOUNTER — Telehealth: Payer: Self-pay | Admitting: Urology

## 2019-02-02 NOTE — Telephone Encounter (Signed)
Oh no im sorry to hear that! Will you put a sticky note on my desk to remind me to call her Thanks Dr. Kathie Rhodes

## 2019-02-02 NOTE — Telephone Encounter (Signed)
Pt's daughter called and wanted to talk with you. She wanted to let you know that [name] passed Saturday in his sleep. She said thank you for all you did for him.

## 2019-02-02 DEATH — deceased

## 2019-02-13 ENCOUNTER — Ambulatory Visit: Payer: Medicare HMO | Admitting: Sports Medicine

## 2019-04-17 ENCOUNTER — Ambulatory Visit: Payer: Medicare HMO | Admitting: Sports Medicine

## 2019-08-06 IMAGING — DX DG SHOULDER 1V*L*
1 series · 1 of 1 positions shown · non-contrast
Comparison: None.

CLINICAL DATA: Left shoulder replacement

EXAM:
LEFT SHOULDER - 1 VIEW

[shoulder ap]
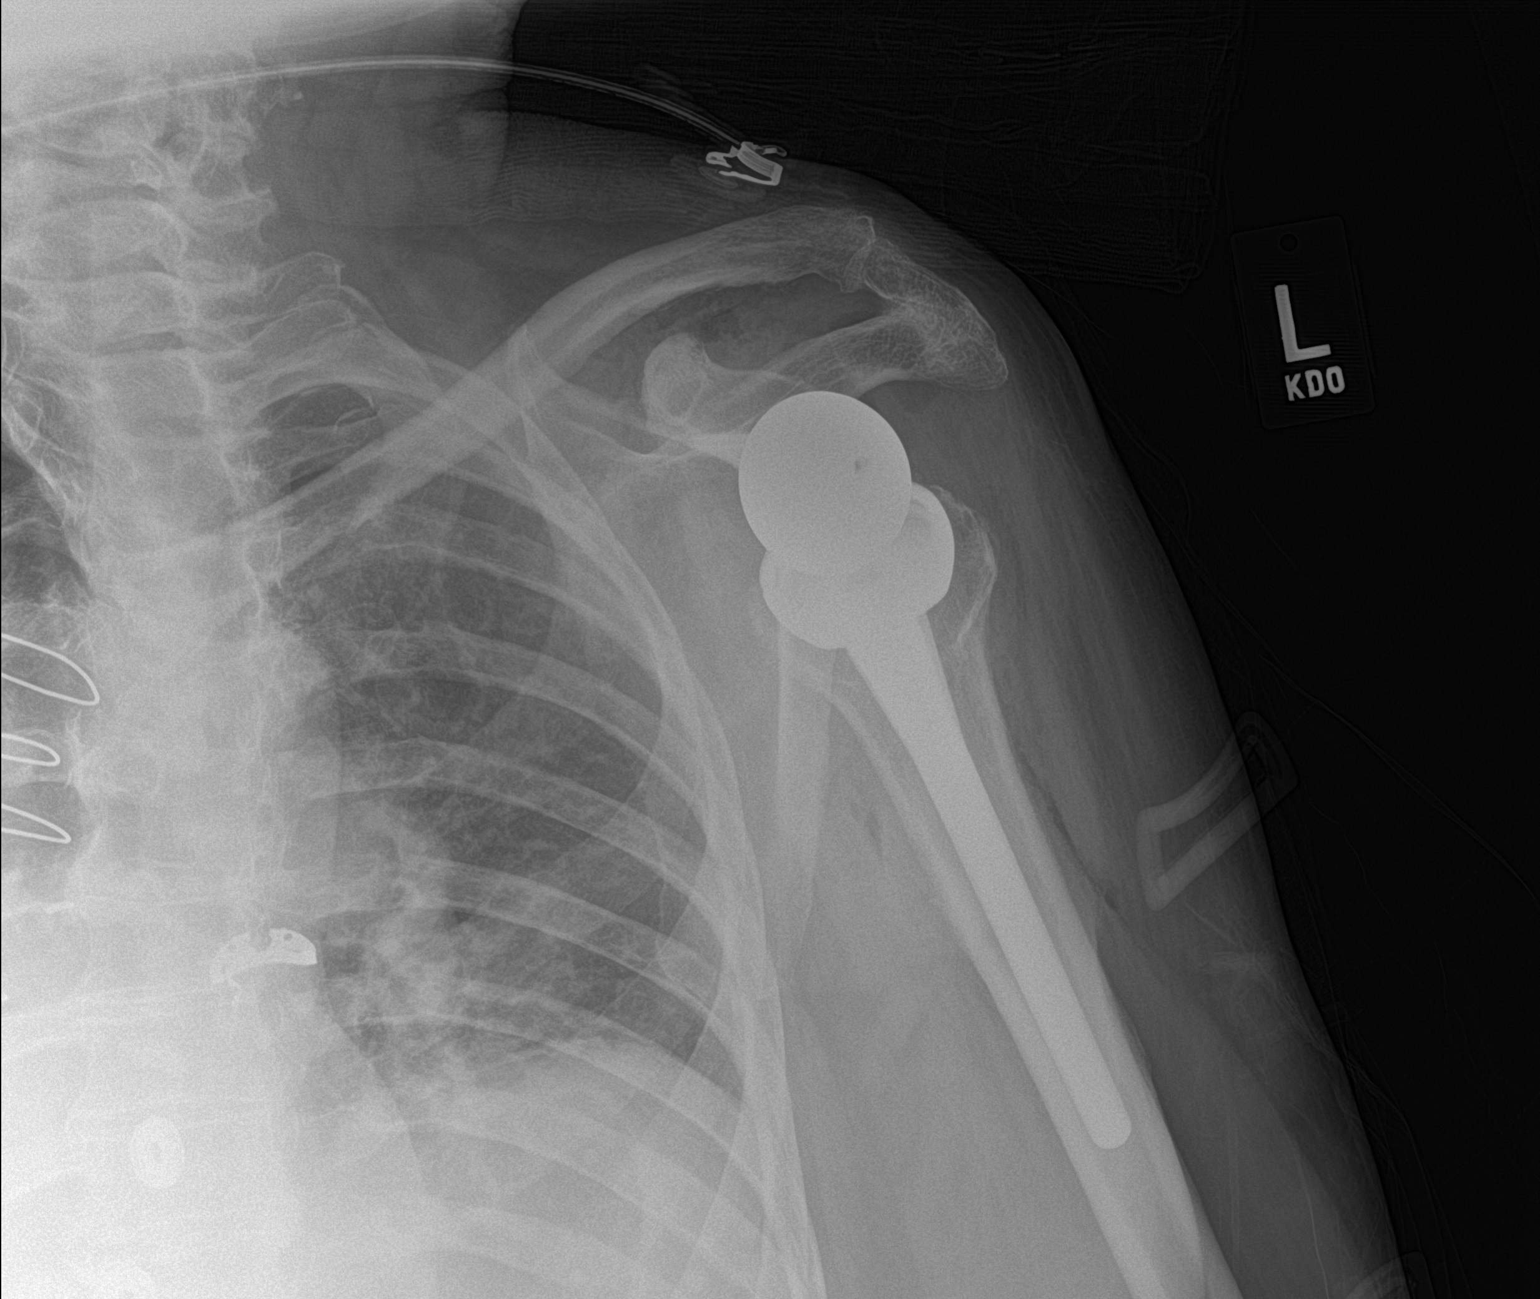

[1 of 1 positions shown; findings below may reference images not displayed]

FINDINGS: Reverse glenohumeral arthroplasty. No evidence of periprosthetic
fracture in this single projection. Expected soft tissue gas about
the operative site. Low volume left chest.
IMPRESSION: Reverse glenohumeral arthroplasty. No evidence of periprosthetic
fracture.

## 2019-11-23 IMAGING — DX DG CHEST 2V
2 series · 2 of 2 positions shown · non-contrast
Comparison: 05/05/2014 chest radiograph

CLINICAL DATA: 85 y/o M; abnormal breathing with decreased breath
sounds at right base.

EXAM:
CHEST  2 VIEW

[dg chest 2 view (1 of 2)]
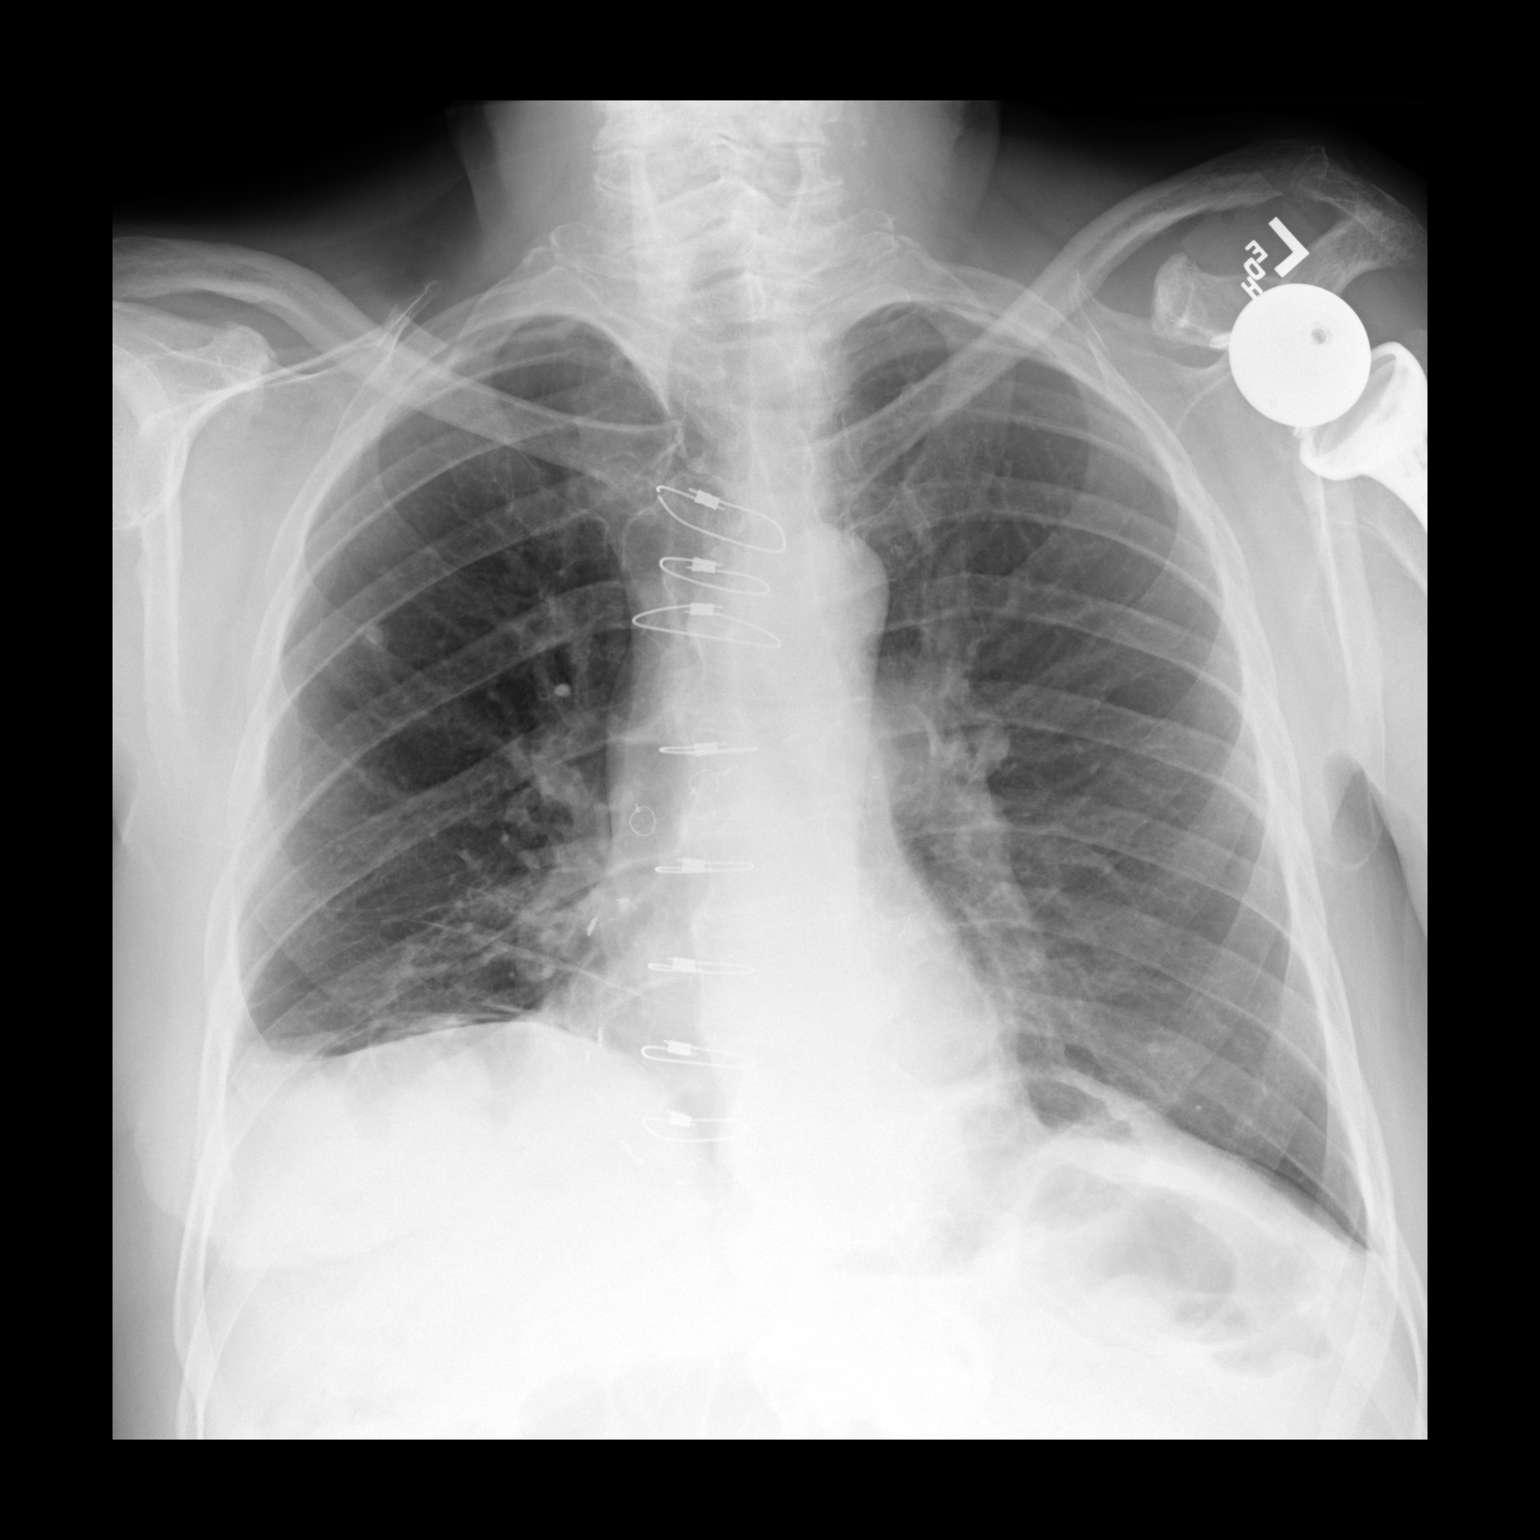

[dg chest 2 view (2 of 2)]
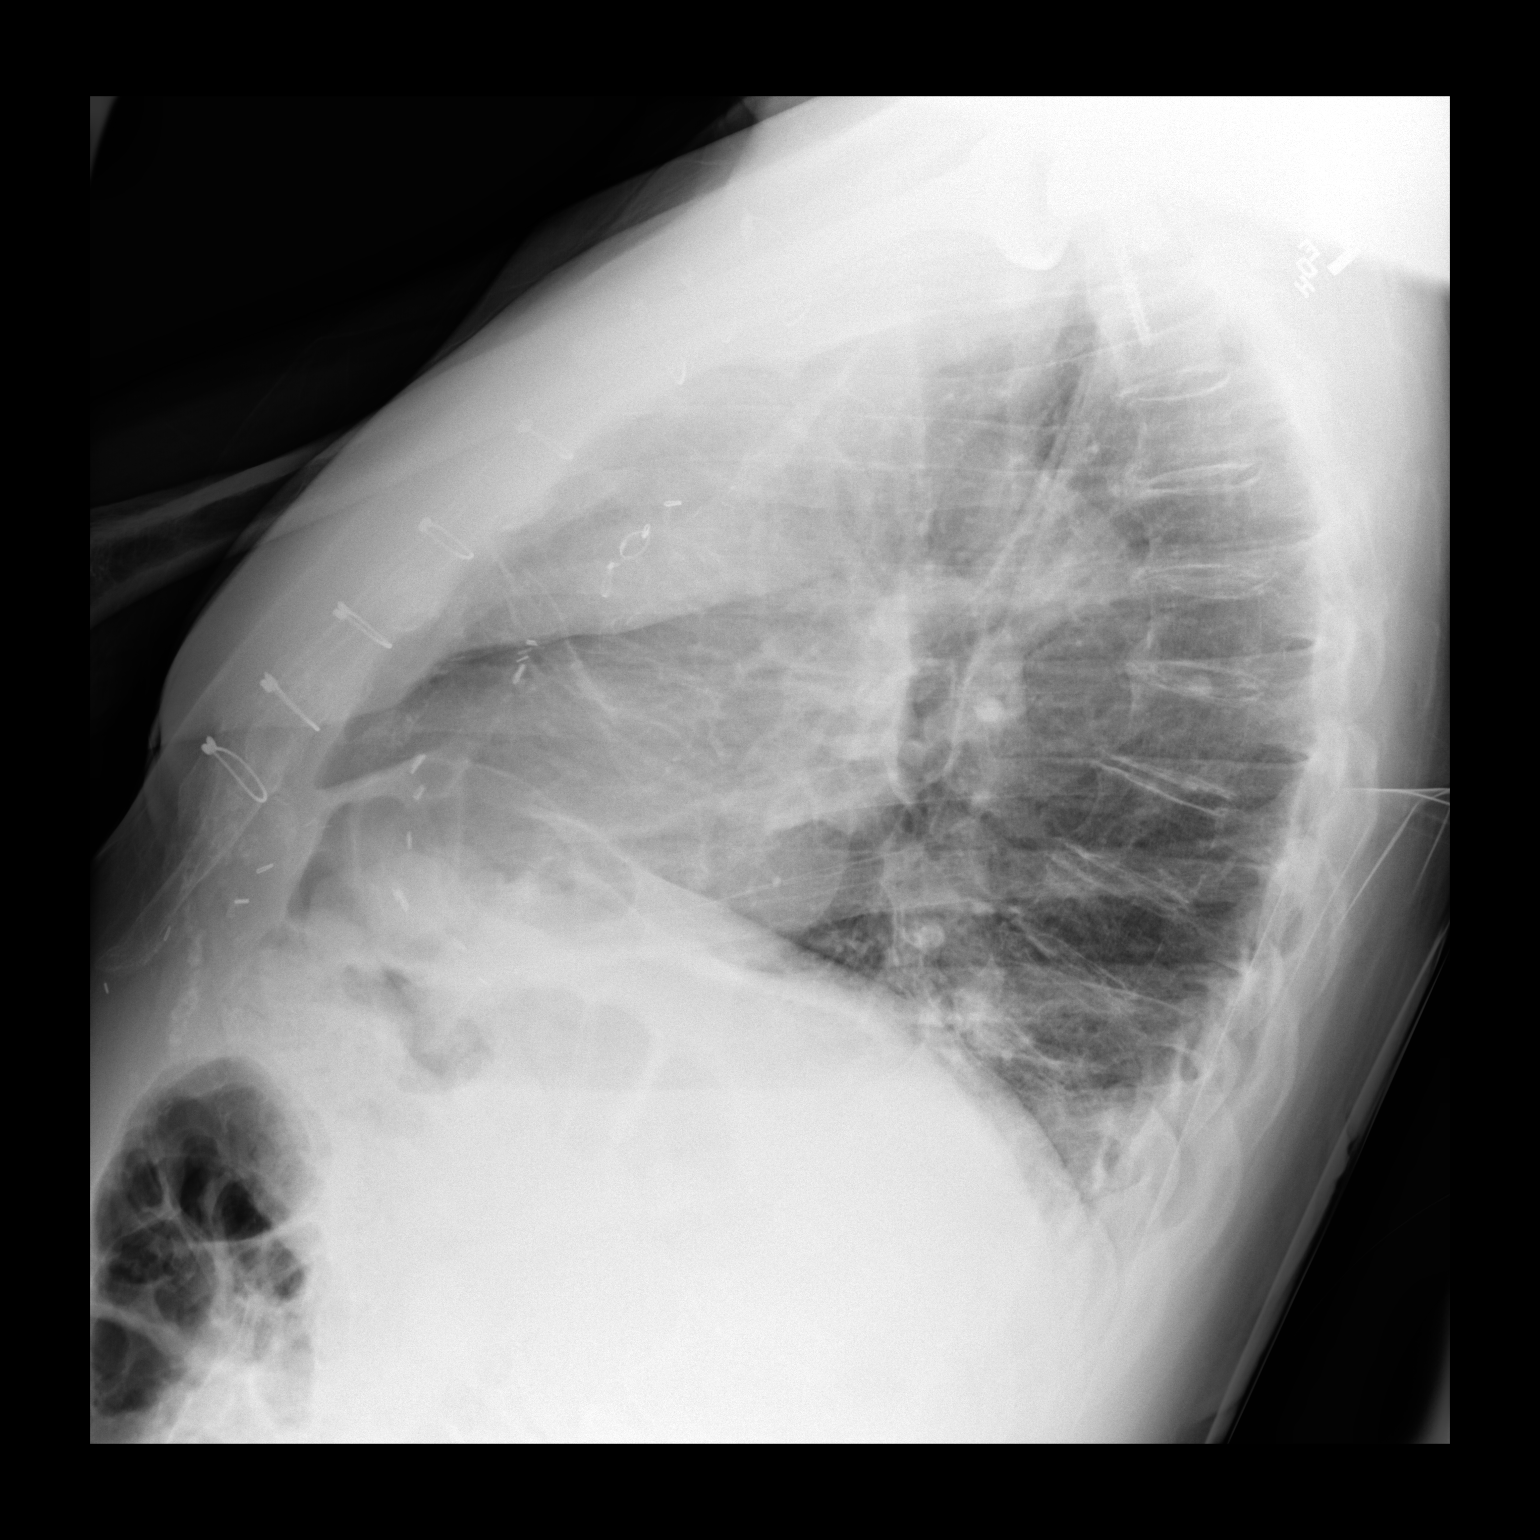

[2 of 2 positions shown; findings below may reference images not displayed]

FINDINGS: Post median sternotomy with wires in alignment. Stable broken first
wire. Stable normal cardiac silhouette. Aortic atherosclerosis with
calcification. Streaky opacities at right lung base. Stable
granuloma projecting between right fifth and sixth rib interspace.
No pleural effusion or pneumothorax. Interval left shoulder
replacement.
IMPRESSION: Streaky opacities of right lung base may represent atelectasis or
pneumonia.

By: Sheraz Arnold M.D.
